# Patient Record
Sex: Female | Born: 1935
Health system: Southern US, Community
[De-identification: ages and names within clinical notes are randomized; demographics above are authoritative.]

## PROBLEM LIST (undated history)

## (undated) DIAGNOSIS — M1990 Unspecified osteoarthritis, unspecified site: Secondary | ICD-10-CM

## (undated) DIAGNOSIS — I341 Nonrheumatic mitral (valve) prolapse: Secondary | ICD-10-CM

## (undated) DIAGNOSIS — S0990XA Unspecified injury of head, initial encounter: Secondary | ICD-10-CM

## (undated) DIAGNOSIS — K5900 Constipation, unspecified: Secondary | ICD-10-CM

## (undated) DIAGNOSIS — I10 Essential (primary) hypertension: Secondary | ICD-10-CM

## (undated) DIAGNOSIS — F329 Major depressive disorder, single episode, unspecified: Secondary | ICD-10-CM

## (undated) DIAGNOSIS — H269 Unspecified cataract: Secondary | ICD-10-CM

## (undated) DIAGNOSIS — I639 Cerebral infarction, unspecified: Secondary | ICD-10-CM

## (undated) DIAGNOSIS — R079 Chest pain, unspecified: Secondary | ICD-10-CM

## (undated) DIAGNOSIS — K5792 Diverticulitis of intestine, part unspecified, without perforation or abscess without bleeding: Secondary | ICD-10-CM

## (undated) DIAGNOSIS — F32A Depression, unspecified: Secondary | ICD-10-CM

## (undated) DIAGNOSIS — I209 Angina pectoris, unspecified: Secondary | ICD-10-CM

## (undated) DIAGNOSIS — M8580 Other specified disorders of bone density and structure, unspecified site: Secondary | ICD-10-CM

## (undated) HISTORY — DX: Chest pain, unspecified: R07.9

## (undated) HISTORY — DX: Unspecified cataract: H26.9

## (undated) HISTORY — PX: COLONOSCOPY: SHX174

## (undated) HISTORY — DX: Diverticulitis of intestine, part unspecified, without perforation or abscess without bleeding: K57.92

## (undated) HISTORY — DX: Angina pectoris, unspecified: I20.9

---
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
## 1988-03-05 HISTORY — PX: BREAST CYST ASPIRATION: SHX578

## 1997-11-17 ENCOUNTER — Ambulatory Visit (HOSPITAL_COMMUNITY): Admission: RE | Admit: 1997-11-17 | Discharge: 1997-11-17 | Payer: Self-pay | Admitting: Family Medicine

## 1998-09-07 ENCOUNTER — Ambulatory Visit (HOSPITAL_COMMUNITY): Admission: RE | Admit: 1998-09-07 | Discharge: 1998-09-07 | Payer: Self-pay | Admitting: Urology

## 1998-09-07 ENCOUNTER — Encounter (INDEPENDENT_AMBULATORY_CARE_PROVIDER_SITE_OTHER): Payer: Self-pay | Admitting: Specialist

## 1998-09-07 ENCOUNTER — Encounter: Payer: Self-pay | Admitting: Urology

## 1998-11-29 ENCOUNTER — Ambulatory Visit (HOSPITAL_COMMUNITY): Admission: RE | Admit: 1998-11-29 | Discharge: 1998-11-29 | Payer: Self-pay | Admitting: Family Medicine

## 1998-11-29 ENCOUNTER — Encounter: Payer: Self-pay | Admitting: Family Medicine

## 1998-12-07 ENCOUNTER — Other Ambulatory Visit: Admission: RE | Admit: 1998-12-07 | Discharge: 1998-12-07 | Payer: Self-pay | Admitting: Gynecology

## 1998-12-28 ENCOUNTER — Encounter: Admission: RE | Admit: 1998-12-28 | Discharge: 1998-12-28 | Payer: Self-pay | Admitting: Urology

## 1998-12-28 ENCOUNTER — Encounter: Payer: Self-pay | Admitting: Urology

## 1999-08-28 ENCOUNTER — Encounter: Admission: RE | Admit: 1999-08-28 | Discharge: 1999-08-28 | Payer: Self-pay | Admitting: Family Medicine

## 1999-08-28 ENCOUNTER — Encounter: Payer: Self-pay | Admitting: Family Medicine

## 1999-12-06 ENCOUNTER — Other Ambulatory Visit: Admission: RE | Admit: 1999-12-06 | Discharge: 1999-12-06 | Payer: Self-pay | Admitting: Gynecology

## 1999-12-13 ENCOUNTER — Ambulatory Visit (HOSPITAL_COMMUNITY): Admission: RE | Admit: 1999-12-13 | Discharge: 1999-12-13 | Payer: Self-pay | Admitting: Gynecology

## 1999-12-13 ENCOUNTER — Encounter: Payer: Self-pay | Admitting: Gynecology

## 1999-12-29 ENCOUNTER — Other Ambulatory Visit: Admission: RE | Admit: 1999-12-29 | Discharge: 1999-12-29 | Payer: Self-pay | Admitting: Gynecology

## 1999-12-29 ENCOUNTER — Encounter (INDEPENDENT_AMBULATORY_CARE_PROVIDER_SITE_OTHER): Payer: Self-pay | Admitting: Specialist

## 2000-12-19 ENCOUNTER — Ambulatory Visit (HOSPITAL_COMMUNITY): Admission: RE | Admit: 2000-12-19 | Discharge: 2000-12-19 | Payer: Self-pay | Admitting: Gynecology

## 2000-12-19 ENCOUNTER — Encounter: Payer: Self-pay | Admitting: Family Medicine

## 2001-01-02 ENCOUNTER — Encounter: Admission: RE | Admit: 2001-01-02 | Discharge: 2001-01-02 | Payer: Self-pay | Admitting: Family Medicine

## 2001-01-02 ENCOUNTER — Encounter: Payer: Self-pay | Admitting: Family Medicine

## 2001-01-02 ENCOUNTER — Other Ambulatory Visit: Admission: RE | Admit: 2001-01-02 | Discharge: 2001-01-02 | Payer: Self-pay | Admitting: Gynecology

## 2001-02-17 ENCOUNTER — Encounter: Payer: Self-pay | Admitting: Gynecology

## 2001-02-17 ENCOUNTER — Ambulatory Visit (HOSPITAL_COMMUNITY): Admission: RE | Admit: 2001-02-17 | Discharge: 2001-02-17 | Payer: Self-pay | Admitting: Gynecology

## 2001-10-06 ENCOUNTER — Encounter: Admission: RE | Admit: 2001-10-06 | Discharge: 2001-10-06 | Payer: Self-pay | Admitting: Family Medicine

## 2001-10-06 ENCOUNTER — Encounter: Payer: Self-pay | Admitting: Family Medicine

## 2002-01-07 ENCOUNTER — Other Ambulatory Visit: Admission: RE | Admit: 2002-01-07 | Discharge: 2002-01-07 | Payer: Self-pay | Admitting: Gynecology

## 2002-01-26 ENCOUNTER — Ambulatory Visit (HOSPITAL_COMMUNITY): Admission: RE | Admit: 2002-01-26 | Discharge: 2002-01-26 | Payer: Self-pay | Admitting: Family Medicine

## 2002-01-26 ENCOUNTER — Encounter: Payer: Self-pay | Admitting: Family Medicine

## 2003-01-11 ENCOUNTER — Other Ambulatory Visit: Admission: RE | Admit: 2003-01-11 | Discharge: 2003-01-11 | Payer: Self-pay | Admitting: Gynecology

## 2003-02-05 ENCOUNTER — Ambulatory Visit (HOSPITAL_COMMUNITY): Admission: RE | Admit: 2003-02-05 | Discharge: 2003-02-05 | Payer: Self-pay | Admitting: Gynecology

## 2003-03-15 ENCOUNTER — Ambulatory Visit (HOSPITAL_COMMUNITY): Admission: RE | Admit: 2003-03-15 | Discharge: 2003-03-15 | Payer: Self-pay | Admitting: Gastroenterology

## 2003-03-15 LAB — HM COLONOSCOPY

## 2004-01-19 ENCOUNTER — Other Ambulatory Visit: Admission: RE | Admit: 2004-01-19 | Discharge: 2004-01-19 | Payer: Self-pay | Admitting: Gynecology

## 2004-02-09 ENCOUNTER — Ambulatory Visit (HOSPITAL_COMMUNITY): Admission: RE | Admit: 2004-02-09 | Discharge: 2004-02-09 | Payer: Self-pay | Admitting: Family Medicine

## 2005-03-23 ENCOUNTER — Ambulatory Visit (HOSPITAL_COMMUNITY): Admission: RE | Admit: 2005-03-23 | Discharge: 2005-03-23 | Payer: Self-pay | Admitting: Family Medicine

## 2005-04-10 ENCOUNTER — Other Ambulatory Visit: Admission: RE | Admit: 2005-04-10 | Discharge: 2005-04-10 | Payer: Self-pay | Admitting: Gynecology

## 2005-12-18 ENCOUNTER — Ambulatory Visit (HOSPITAL_COMMUNITY): Admission: RE | Admit: 2005-12-18 | Discharge: 2005-12-18 | Payer: Self-pay | Admitting: Family Medicine

## 2006-03-05 DIAGNOSIS — H269 Unspecified cataract: Secondary | ICD-10-CM

## 2006-03-05 HISTORY — DX: Unspecified cataract: H26.9

## 2006-04-08 ENCOUNTER — Ambulatory Visit (HOSPITAL_COMMUNITY): Admission: RE | Admit: 2006-04-08 | Discharge: 2006-04-08 | Payer: Self-pay | Admitting: Gynecology

## 2006-04-30 ENCOUNTER — Other Ambulatory Visit: Admission: RE | Admit: 2006-04-30 | Discharge: 2006-04-30 | Payer: Self-pay | Admitting: Gynecology

## 2007-04-10 ENCOUNTER — Ambulatory Visit (HOSPITAL_COMMUNITY): Admission: RE | Admit: 2007-04-10 | Discharge: 2007-04-10 | Payer: Self-pay | Admitting: Family Medicine

## 2007-09-04 DIAGNOSIS — R079 Chest pain, unspecified: Secondary | ICD-10-CM

## 2007-09-04 HISTORY — DX: Chest pain, unspecified: R07.9

## 2007-11-03 ENCOUNTER — Other Ambulatory Visit: Admission: RE | Admit: 2007-11-03 | Discharge: 2007-11-03 | Payer: Self-pay | Admitting: Gynecology

## 2007-11-07 ENCOUNTER — Ambulatory Visit (HOSPITAL_COMMUNITY): Admission: RE | Admit: 2007-11-07 | Discharge: 2007-11-07 | Payer: Self-pay | Admitting: Gynecology

## 2008-04-23 ENCOUNTER — Ambulatory Visit: Payer: Self-pay | Admitting: Internal Medicine

## 2008-04-23 ENCOUNTER — Inpatient Hospital Stay (HOSPITAL_COMMUNITY): Admission: EM | Admit: 2008-04-23 | Discharge: 2008-04-30 | Payer: Self-pay | Admitting: Emergency Medicine

## 2008-05-26 ENCOUNTER — Ambulatory Visit (HOSPITAL_COMMUNITY): Admission: RE | Admit: 2008-05-26 | Discharge: 2008-05-26 | Payer: Self-pay | Admitting: Family Medicine

## 2008-06-17 ENCOUNTER — Ambulatory Visit (HOSPITAL_COMMUNITY): Admission: RE | Admit: 2008-06-17 | Discharge: 2008-06-17 | Payer: Self-pay | Admitting: Family Medicine

## 2009-03-05 HISTORY — PX: EYE SURGERY: SHX253

## 2009-07-07 ENCOUNTER — Ambulatory Visit (HOSPITAL_COMMUNITY): Admission: RE | Admit: 2009-07-07 | Discharge: 2009-07-07 | Payer: Self-pay | Admitting: Family Medicine

## 2009-11-09 ENCOUNTER — Ambulatory Visit (HOSPITAL_COMMUNITY): Admission: RE | Admit: 2009-11-09 | Discharge: 2009-11-09 | Payer: Self-pay | Admitting: Family Medicine

## 2010-03-05 DIAGNOSIS — Z9889 Other specified postprocedural states: Secondary | ICD-10-CM

## 2010-03-05 HISTORY — DX: Other specified postprocedural states: Z98.890

## 2010-04-05 HISTORY — PX: OTHER SURGICAL HISTORY: SHX169

## 2010-06-20 LAB — BASIC METABOLIC PANEL
BUN: 2 mg/dL — ABNORMAL LOW (ref 6–23)
BUN: 23 mg/dL (ref 6–23)
BUN: 3 mg/dL — ABNORMAL LOW (ref 6–23)
BUN: 6 mg/dL (ref 6–23)
BUN: 7 mg/dL (ref 6–23)
BUN: 9 mg/dL (ref 6–23)
CO2: 27 mEq/L (ref 19–32)
CO2: 28 mEq/L (ref 19–32)
CO2: 28 mEq/L (ref 19–32)
CO2: 29 mEq/L (ref 19–32)
CO2: 29 mEq/L (ref 19–32)
CO2: 30 mEq/L (ref 19–32)
Calcium: 7.8 mg/dL — ABNORMAL LOW (ref 8.4–10.5)
Calcium: 7.9 mg/dL — ABNORMAL LOW (ref 8.4–10.5)
Calcium: 8.6 mg/dL (ref 8.4–10.5)
Calcium: 8.6 mg/dL (ref 8.4–10.5)
Calcium: 8.7 mg/dL (ref 8.4–10.5)
Calcium: 8.9 mg/dL (ref 8.4–10.5)
Chloride: 104 mEq/L (ref 96–112)
Chloride: 104 mEq/L (ref 96–112)
Chloride: 104 mEq/L (ref 96–112)
Chloride: 105 mEq/L (ref 96–112)
Chloride: 109 mEq/L (ref 96–112)
Chloride: 110 mEq/L (ref 96–112)
Creatinine, Ser: 0.66 mg/dL (ref 0.4–1.2)
Creatinine, Ser: 0.69 mg/dL (ref 0.4–1.2)
Creatinine, Ser: 0.72 mg/dL (ref 0.4–1.2)
Creatinine, Ser: 0.86 mg/dL (ref 0.4–1.2)
Creatinine, Ser: 0.9 mg/dL (ref 0.4–1.2)
Creatinine, Ser: 1.14 mg/dL (ref 0.4–1.2)
GFR calc Af Amer: 57 mL/min — ABNORMAL LOW (ref >60)
GFR calc Af Amer: >60 mL/min (ref >60)
GFR calc Af Amer: >60 mL/min (ref >60)
GFR calc Af Amer: >60 mL/min (ref >60)
GFR calc Af Amer: >60 mL/min (ref >60)
GFR calc Af Amer: >60 mL/min (ref >60)
GFR calc non Af Amer: 47 mL/min — ABNORMAL LOW (ref >60)
GFR calc non Af Amer: >60 mL/min (ref >60)
GFR calc non Af Amer: >60 mL/min (ref >60)
GFR calc non Af Amer: >60 mL/min (ref >60)
GFR calc non Af Amer: >60 mL/min (ref >60)
GFR calc non Af Amer: >60 mL/min (ref >60)
Glucose, Bld: 110 mg/dL — ABNORMAL HIGH (ref 70–99)
Glucose, Bld: 115 mg/dL — ABNORMAL HIGH (ref 70–99)
Glucose, Bld: 133 mg/dL — ABNORMAL HIGH (ref 70–99)
Glucose, Bld: 145 mg/dL — ABNORMAL HIGH (ref 70–99)
Glucose, Bld: 95 mg/dL (ref 70–99)
Glucose, Bld: 99 mg/dL (ref 70–99)
Potassium: 3.1 mEq/L — ABNORMAL LOW (ref 3.5–5.1)
Potassium: 3.5 mEq/L (ref 3.5–5.1)
Potassium: 3.6 mEq/L (ref 3.5–5.1)
Potassium: 3.9 mEq/L (ref 3.5–5.1)
Potassium: 4 mEq/L (ref 3.5–5.1)
Potassium: 4.2 mEq/L (ref 3.5–5.1)
Sodium: 138 mEq/L (ref 135–145)
Sodium: 138 mEq/L (ref 135–145)
Sodium: 140 mEq/L (ref 135–145)
Sodium: 140 mEq/L (ref 135–145)
Sodium: 141 mEq/L (ref 135–145)
Sodium: 143 mEq/L (ref 135–145)

## 2010-06-20 LAB — CBC
HCT: 33.7 % — ABNORMAL LOW (ref 36.0–46.0)
HCT: 35.3 % — ABNORMAL LOW (ref 36.0–46.0)
HCT: 36.1 % (ref 36.0–46.0)
HCT: 40.8 % (ref 36.0–46.0)
HCT: 41.1 % (ref 36.0–46.0)
HCT: 47 % — ABNORMAL HIGH (ref 36.0–46.0)
Hemoglobin: 11.4 g/dL — ABNORMAL LOW (ref 12.0–15.0)
Hemoglobin: 12.1 g/dL (ref 12.0–15.0)
Hemoglobin: 12.4 g/dL (ref 12.0–15.0)
Hemoglobin: 13.7 g/dL (ref 12.0–15.0)
Hemoglobin: 13.9 g/dL (ref 12.0–15.0)
Hemoglobin: 15.8 g/dL — ABNORMAL HIGH (ref 12.0–15.0)
MCHC: 33.5 g/dL (ref 30.0–36.0)
MCHC: 33.5 g/dL (ref 30.0–36.0)
MCHC: 33.8 g/dL (ref 30.0–36.0)
MCHC: 33.8 g/dL (ref 30.0–36.0)
MCHC: 34.2 g/dL (ref 30.0–36.0)
MCHC: 34.4 g/dL (ref 30.0–36.0)
MCV: 93.7 fL (ref 78.0–100.0)
MCV: 93.8 fL (ref 78.0–100.0)
MCV: 94 fL (ref 78.0–100.0)
MCV: 94.2 fL (ref 78.0–100.0)
MCV: 94.5 fL (ref 78.0–100.0)
MCV: 94.7 fL (ref 78.0–100.0)
Platelets: 191 10*3/uL (ref 150–400)
Platelets: 201 10*3/uL (ref 150–400)
Platelets: 233 10*3/uL (ref 150–400)
Platelets: 244 10*3/uL (ref 150–400)
Platelets: 286 10*3/uL (ref 150–400)
Platelets: 301 10*3/uL (ref 150–400)
RBC: 3.57 MIL/uL — ABNORMAL LOW (ref 3.87–5.11)
RBC: 3.76 MIL/uL — ABNORMAL LOW (ref 3.87–5.11)
RBC: 3.85 MIL/uL — ABNORMAL LOW (ref 3.87–5.11)
RBC: 4.31 MIL/uL (ref 3.87–5.11)
RBC: 4.38 MIL/uL (ref 3.87–5.11)
RBC: 4.99 MIL/uL (ref 3.87–5.11)
RDW: 12.4 % (ref 11.5–15.5)
RDW: 12.6 % (ref 11.5–15.5)
RDW: 12.6 % (ref 11.5–15.5)
RDW: 12.6 % (ref 11.5–15.5)
RDW: 12.6 % (ref 11.5–15.5)
RDW: 12.7 % (ref 11.5–15.5)
WBC: 10.4 10*3/uL (ref 4.0–10.5)
WBC: 10.6 10*3/uL — ABNORMAL HIGH (ref 4.0–10.5)
WBC: 11.8 10*3/uL — ABNORMAL HIGH (ref 4.0–10.5)
WBC: 13.3 10*3/uL — ABNORMAL HIGH (ref 4.0–10.5)
WBC: 7.6 10*3/uL (ref 4.0–10.5)
WBC: 8.6 10*3/uL (ref 4.0–10.5)

## 2010-06-20 LAB — URINE MICROSCOPIC-ADD ON

## 2010-06-20 LAB — COMPREHENSIVE METABOLIC PANEL
ALT: 22 U/L (ref 0–35)
AST: 32 U/L (ref 0–37)
Albumin: 4.3 g/dL (ref 3.5–5.2)
Alkaline Phosphatase: 62 U/L (ref 39–117)
BUN: 21 mg/dL (ref 6–23)
CO2: 29 mEq/L (ref 19–32)
Calcium: 9.6 mg/dL (ref 8.4–10.5)
Chloride: 102 mEq/L (ref 96–112)
Creatinine, Ser: 1.27 mg/dL — ABNORMAL HIGH (ref 0.4–1.2)
GFR calc Af Amer: 50 mL/min — ABNORMAL LOW (ref >60)
GFR calc non Af Amer: 41 mL/min — ABNORMAL LOW (ref >60)
Glucose, Bld: 140 mg/dL — ABNORMAL HIGH (ref 70–99)
Potassium: 3.9 mEq/L (ref 3.5–5.1)
Sodium: 139 mEq/L (ref 135–145)
Total Bilirubin: 1 mg/dL (ref 0.3–1.2)
Total Protein: 7.3 g/dL (ref 6.0–8.3)

## 2010-06-20 LAB — LIPASE, BLOOD: Lipase: 19 U/L (ref 11–59)

## 2010-06-20 LAB — DIFFERENTIAL
Basophils Absolute: 0 10*3/uL (ref 0.0–0.1)
Basophils Absolute: 0.1 10*3/uL (ref 0.0–0.1)
Basophils Relative: 0 % (ref 0–1)
Basophils Relative: 1 % (ref 0–1)
Eosinophils Absolute: 0 10*3/uL (ref 0.0–0.7)
Eosinophils Absolute: 0.4 10*3/uL (ref 0.0–0.7)
Eosinophils Relative: 0 % (ref 0–5)
Eosinophils Relative: 5 % (ref 0–5)
Lymphocytes Relative: 5 % — ABNORMAL LOW (ref 12–46)
Lymphocytes Relative: 9 % — ABNORMAL LOW (ref 12–46)
Lymphs Abs: 0.7 10*3/uL (ref 0.7–4.0)
Lymphs Abs: 0.8 10*3/uL (ref 0.7–4.0)
Monocytes Absolute: 0.3 10*3/uL (ref 0.1–1.0)
Monocytes Absolute: 1 10*3/uL (ref 0.1–1.0)
Monocytes Relative: 12 % (ref 3–12)
Monocytes Relative: 2 % — ABNORMAL LOW (ref 3–12)
Neutro Abs: 12.2 10*3/uL — ABNORMAL HIGH (ref 1.7–7.7)
Neutro Abs: 6.3 10*3/uL (ref 1.7–7.7)
Neutrophils Relative %: 73 % (ref 43–77)
Neutrophils Relative %: 92 % — ABNORMAL HIGH (ref 43–77)

## 2010-06-20 LAB — URINALYSIS, ROUTINE W REFLEX MICROSCOPIC
Bilirubin Urine: NEGATIVE
Glucose, UA: NEGATIVE mg/dL
Ketones, ur: 15 mg/dL — AB
Leukocytes, UA: NEGATIVE
Nitrite: NEGATIVE
Protein, ur: 100 mg/dL — AB
Specific Gravity, Urine: 1.026 (ref 1.005–1.030)
Urobilinogen, UA: 0.2 mg/dL (ref 0.0–1.0)
pH: 5.5 (ref 5.0–8.0)

## 2010-06-20 LAB — POCT CARDIAC MARKERS
CKMB, poc: 2.6 ng/mL (ref 1.0–8.0)
Myoglobin, poc: 110 ng/mL (ref 12–200)
Troponin i, poc: <0.05 ng/mL (ref 0.00–0.09)

## 2010-07-18 NOTE — Consult Note (Signed)
NAMEPATTI, Adriana [name]                   ACCOUNT NO.:  [id number]   MEDICAL RECORD NO.:  [id number]          PATIENT TYPE:  INP   LOCATION:  1532                         FACILITY:  [facility]   PHYSICIAN:  Sharlet Salina T. Hoxworth, M.D.DATE OF BIRTH:  11-02-1934   DATE OF CONSULTATION:  04/25/2008  DATE OF DISCHARGE:                                 CONSULTATION   REFERRING PHYSICIAN:  Dr. Rito Ehrlich.   CHIEF COMPLAINT:  Abdominal pain, nausea, vomiting.   HISTORY OF PRESENT ILLNESS:  I was asked by Dr. Rito Ehrlich today to  evaluate Adriana [name].  She is a very pleasant 76 year old white female.  She was admitted to [facility] on February 19 after an approximately 36-  hour history of abdominal pain, nausea, vomiting.  She was in her usual  state of good health and then developed the gradual onset of upper and  mid abdominal pain which she describes as sharp associated with nausea  and then vomiting.  Her vomitus was initially clear and then became  bilious.  She was seen early on as an outpatient and evaluated and felt  to have a Norovirus, but her pain worsened and then she was admitted on  February 19.  She has no previous history of abdominal surgery.  She  describes constant waxing and waning pain, sharp and pressure-like in  her mid abdomen epigastrium.  The vomiting has been relieved after NG  placement.  She has had one tiny amount of flatus since admission but no  bowel movement.  Her abdomen has been distended initially, improved with  NG and now she states is more distended again.  She again denies any  chronic similar symptoms.  She has had a little worsening reflux  symptoms over the last few months controlled with p.r.n. meds.  No fever  or chills.   CT scan was obtained on admission.  This showed evidence of small bowel  obstruction with no mass or other abnormality or cause of the  obstruction identified.  She subsequently has had two KUBs yesterday and  today, which I have reviewed and  this shows persistent high-grade  partial bowel obstruction, although some contrast does get over to the  colon.   PAST MEDICAL HISTORY:  1. Surgery:  No abdominal surgery.  She has had some orthopedic      procedures.  2. Medically she is generally in good health, followed for mild      hypertension, history of depression, osteoporosis.   MEDICATIONS ON ADMISSION:  1. Fosamax 70 daily.  2. Metoprolol 12.5 b.i.d.  3. Bupropion 200 b.i.d.  4. Celexa 20 daily.  5. Aspirin 81 daily.  6. Calcium.   ALLERGIES:  CODEINE ERYTHROMYCIN IVP DYE DARVOCET.   FAMILY HISTORY:  Significant for heart disease, hypertension.   SOCIAL HISTORY:  Married.  Husband at bedside.  No cigarettes or  alcohol.   REVIEW OF SYSTEMS:  GENERAL:  Weight has been stable.  No fever, chills.  RESPIRATORY:  No shortness of breath, cough, wheezing.  CARDIAC:  No  chest pain, palpitations, swelling, history of heart  disease.  ABDOMEN:  GI as above.  GU:  No urinary burning frequency.  MUSCULOSKELETAL:  Some  mild chronic joint pain.   PHYSICAL EXAM:  Currently she is afebrile, heart rate 72, blood pressure  154/77, respirations 16, O2 saturations 96% room air.  GENERAL:  Mildly sedated pleasant white female in no acute distress.  SKIN:  Warm, dry.  No rash or infection.  HEENT:  No palpable mass or thyromegaly.  Sclerae nonicteric.  LYMPH NODES.  No cervical, subclavicular, inguinal nodes palpable.  LUNGS:  Clear without wheezing or increased work of breathing.  CARDIAC:  Regular rate and rhythm.  No murmurs.  No edema.  ABDOMEN:  Moderate distention.  There is mild diffuse tenderness but  more tenderness in the right lower quadrant.  Some guarding.  No  palpable hernias.  EXTREMITIES:  No joint swelling, deformity.  NEUROLOGIC:  Alert, oriented.  Motor and sensory exams grossly normal.   LABORATORY:  White count 11.8 down from 13 at admission.  Hemoglobin  normal.  Electrolytes unremarkable.  LFTs on  admission were  unremarkable.   ASSESSMENT/PLAN:  Persistent small-bowel obstruction in a 76 year old  female with no prior history abdominal surgery.  No clear etiology is  seen on CT scan.  This clearly does not appear to be improving with  conservative management.  There are multiple possible etiologies  including nonoperative inflammatory adhesions, tumor, internal hernia or  appendicitis or diverticulitis not recognized on CT scan.  Risk for  intestinal strangulation is higher in this setting without previous  surgery and if the patient is not improving and has abdominal  tenderness, I believe she needs exploration.  This was discussed with  the patient and her husband.  They are agreeable.  We will plan to  proceed with exploratory laparotomy for persistent a small bowel  obstruction.      Lorne Skeens. Hoxworth, M.D.  Electronically Signed     BTH/MEDQ  D:  04/25/2008  T:  04/25/2008  Job:  [id number]

## 2010-07-18 NOTE — Discharge Summary (Signed)
NAME:  Adriana [name], Adriana [name]                   ACCOUNT NO.:  [id number]   MEDICAL RECORD NO.:  [id number]          PATIENT TYPE:  INP   LOCATION:  1532                         [facility]ACILITY:  [facility]   PHYSICIAN:  Kela Millin, M.D.DATE OF BIRTH:  13-Feb-1935   DATE OF ADMISSION:  04/23/2008  DATE OF DISCHARGE:  04/30/2008                               DISCHARGE SUMMARY   DISCHARGE DIAGNOSES:  1. Small bowel obstruction - secondary to single omental adhesion.  2. Hypokalemia - resolved.  3. Hypertension.  4. History of depression.  5. History of osteoporosis.  6. Probable complex left parapelvic renal cyst - follow up with PCP      for outpatient MRI.   PROCEDURES/STUDIES:  1. CT scan of the abdomen and pelvis - dilated loops of small bowel,      possible partial small bowel obstruction.  Probable slightly      complex left parapelvic renal cyst.  2. CT scan of pelvis - some free fluid within the pelvis, scattered      rectosigmoid colonic diverticula.  3. Exploratory laparotomy and lysis of adhesions for small bowel      obstruction - per Dr. Johna Sheriff on April 25, 2008.   CONSULTATIONS:  [facility] - Sharlet Salina T. Hoxworth, M.D.   BRIEF HISTORY:  The patient is a pleasant 76 year old white female with  the above listed medical problems who presented with complaints of  nausea, vomiting and abdominal pain.  She went to the Urgent Care and  was treated symptomatically for possible viral gastroenteritis.  She  subsequently followed up with her primary care physician on the day of  admission because of worsening symptoms along with abdominal distention  as well.  She was sent to the ED and lab work revealed a white cell  count of 13.3.  CT scan of her abdomen was done and the results are as  stated above.  She was admitted for further evaluation and management.   Please see the full admission history and physical dictated by Dr.  Amador Cunas for the details of the admission physical  exam as well as  the laboratory data.   HOSPITAL COURSE:  1. Small bowel obstruction secondary to single omental adhesion.  Upon      admission, the patient was kept n.p.o. and NG tube was placed.  She      was hydrated with IV fluids and treated with IV narcotics for pain      management.  Surgery was consulted and Dr. Johna Sheriff saw the      patient.  He followed and monitored the patient.  On April 25, 2008, she was taken to surgery and the omental adhesion sliced.      Surgery followed and she improved.  She was started on a clear      liquid diet which was advanced as tolerated.  Dr. Johna Sheriff rounded      on her today and her incision is clean and healing well.  She is      tolerating p.o. well and hemodynamically stable.  He has      recommended discharge home and I agree.  2. Hypokalemia.  Her potassium was replaced in the hospital.  3. Hypertension - she is to continue her outpatient medications upon      discharge.  4. History of osteoporosis - the patient is to continue her Fosamax      upon discharge.  5. Left parapelvic renal cyst, slightly complex - as noted above, she      is to follow up with her primary care physician to have an MRI of      the abdomen in 2-3 months for further evaluation.   DISCHARGE MEDICATIONS:  1. Ultram 50 mg p.o. q.6 h. p.r.n.  2. Senokot 1 p.o. q.h.s. p.r.n.  3. The patient to continue metoprolol 25 mg half a tablet p.o. b.i.d.  4. Wellbutrin 200 mg p.o. b.i.d.  5. Fosamax 70 mg p.o. once weekly.  6. Celexa 20 mg p.o. daily.  7. Aspirin 81 mg p.o. daily.  8. Calcium D daily as previously.  9. Glucosamine as previously.  10.Multivitamin 1 p.o. daily.   FOLLOW-UP CARE:  1. Dr. Johna Sheriff in 1 week, call for appointment - [phone].  2. Dr. Corliss Blacker in 1-2 weeks, call for appointment.   CONDITION ON DISCHARGE:  Improved and stable.      Kela Millin, M.D.  Electronically Signed     ACV/MEDQ  D:  04/30/2008  T:  04/30/2008   Job:  [id number]   cc:   Lorne Skeens. Hoxworth, M.D.  [address], [address]  [place]  [place] [address]   Pam Drown, M.D.  Fax: [phone]

## 2010-07-18 NOTE — H&P (Signed)
Adriana [name], Adriana [name]                   ACCOUNT NO.:  [id number]   MEDICAL RECORD NO.:  [id number]          PATIENT TYPE:  INP   LOCATION:  1532                         [facility]ACILITY:  [facility]   PHYSICIAN:  Gordy Savers, MDDATE OF BIRTH:  November 19, 1934   DATE OF ADMISSION:  04/23/2008  DATE OF DISCHARGE:                              HISTORY & PHYSICAL   CHIEF COMPLAINT:  Abdominal pain, nausea and vomiting.   HISTORY OF PRESENT ILLNESS:  The patient is a 76 year old female who was  in her usual state of good health until the day prior to admission she  awoke yesterday morning with some mild nausea and throughout the day  developed some mild lower abdominal discomfort, throughout the day  developed pain became more generalized, off in the epigastric area and  was associated episodes of severe cramping.  She developed more severe  and frequent nausea and vomiting.  Yesterday she did have two bowel  movement she described as normal.  The patient was seen at Urgent Care  and treated symptomatically for suspected viral gastroenteritis.  The  patient was seen by her primary care physician today with worsening  abdominal pain, distention and was noted to have a leukocytosis.  She  was referred to the ED for further evaluation and treatment of her  abdominal pain, distention and intractable nausea and vomiting.  ED  evaluation included a white count of 13,300.  Chemistries were normal,  lipase was 19.  Urinalysis revealed mild ketonuria.  A CT scan of the  abdomen and pelvis was obtained which revealed findings consistent with  an early small-bowel obstruction.  The patient is now admitted for  further evaluation and treatment of suspected partial small-bowel  obstruction.   PAST MEDICAL HISTORY:   SURGICAL HISTORY:  The patient has had no prior abdominal surgery.   MEDICAL PROBLEMS:  1. Hypertension.  2. History of depression.  3. Osteoporosis.   OPERATIONS:  1. Remote childhood  tonsillectomy.  2. Knee and wrist orthopedic surgery.   PRESENT MEDICAL REGIMEN:  1. Fosamax 70 mg daily.  2. Metoprolol 12.5 mg b.i.d.  3. Bupropion 200 mg b.i.d.  4. Celexa 20 mg daily.  5. Aspirin 81 mg daily.  6. Calcium supplementation.   ALLERGIES:  CODEINE, ERYTHROMYCIN, IVP DYE, PROPOXYPHENE   FAMILY HISTORY:  Father died at age 101 history of coronary artery  disease status post CABG.  Mother died at 66 of complications of  cerebrovascular disease.  She had hypertension and advanced dementia.  She has one brother who has a history of prostate cancer who is a family  practitioner in [place].  The patient has one daughter and a number of  step children and some foster children.   SOCIAL HISTORY:  The patient is married.  Her husband is retired  Orthoptist in the [employer].  She is a lifelong nondrinker,  nonsmoker.   PHYSICAL EXAMINATION:  GENERAL:  Revealed a well-developed, healthy-  appearing female who is slightly somnolent due to the medication.  She  was verbal and appropriate.  SKIN:  Warm  and dry without rash.  HEENT:  Exam revealed normal pupillary responses.  Conjunctiva clear.  She was anicteric.  The left canal was occluded with cerumen.  The right  tympanic membrane was normal.  Oropharynx was slightly dry.  Otherwise  unremarkable.  NECK:  There were no bruits, adenopathy or neck vein distention.  CHEST:  Clear.  CARDIOVASCULAR:  Normal S1-S2.  There is no tachycardia.  BREASTS:  Normal.  ABDOMEN:  Revealed mild diffuse tenderness and mild distention.  Abdominal tenderness was most marked in the suprapubic area.  Bowel  sounds were present.  There was no guarding or rebound.  No bruits were  appreciated.  EXTREMITIES:  Revealed no edema.  Peripheral pulses were full.   IMPRESSION:  Suspected early small-bowel obstruction.   ADDITIONAL DIAGNOSES:  1. Hypertension.  2. History of depression.  3. Osteoporosis.   DISPOSITION:  The patient  will be admitted to the hospital and support  IV fluids.  She will be placed on IV proton pump inhibition.  A  nasogastric tube will be inserted and placed on intermittent suction.  She will be placed on bowel rest.  In the morning, follow-up KUB and  upright abdomen will be reviewed as well as follow-up electrolytes and  another CBC.  The patient will be treated symptomatically.  A Surgical  consult will be considered if the patient is not improved.      Gordy Savers, MD  Electronically Signed     PFK/MEDQ  D:  04/23/2008  T:  04/24/2008  Job:  [id number]

## 2010-07-18 NOTE — Op Note (Signed)
Adriana, [name]                   ACCOUNT NO.:  [id number]   MEDICAL RECORD NO.:  [id number]          PATIENT TYPE:  INP   LOCATION:  1532                         [facility]AC[facility]   PHYSICIAN:  Adriana [name], M.D.DATE OF BIRTH:  March 02, 1935   DATE OF PROCEDURE:  04/25/2008  DATE OF DISCHARGE:                               OPERATIVE REPORT   PREOPERATIVE DIAGNOSIS:  Small bowel obstruction.   POSTOPERATIVE DIAGNOSIS:  Small bowel obstruction secondary to single  omental adhesion.   SURGICAL PROCEDURE:  Exploratory laparotomy and lysis of adhesions for  small bowel obstruction.   SURGEON:  Adriana Skeens. [name], M.D.   ASSISTANT:  Adriana [name], M.D.   ANESTHESIA:  General.   BRIEF HISTORY:  Adriana [name] is a 76 year old female who was admitted  approximately 36 hours ago with 24 hours of abdominal pain, nausea and  vomiting.  She has had a CT scan showing small bowel obstruction without  apparent etiology.  On follow-up, she has failed to improve with  conservative management and I have recommended proceeding with  exploratory laparotomy.  The indications for the procedure, its nature,  risks of bleeding, infection, cardiorespiratory complications, possible  findings were discussed with the patient and her husband.  She is now  brought to the operating room for this procedure.   DESCRIPTION OF OPERATION:  The patient brought to the operating room,  placed in the supine position on the operating table and general  orotracheal anesthesia was induced.  The abdomen was widely sterilely  prepped and draped.  Correct patient and procedure were verified.  She  received preoperative IV antibiotics.  Pneumatic anti-embolism stockings  were placed.  The abdomen was explored through a fairly small midline  incision skirting the umbilicus.  She had no previous history of any  abdominal surgery.  On entering the peritoneal cavity there was a  moderate amount of straw-colored  peritoneal fluid.  The small bowel was  delivered into the wound and immediately apparent was a hemorrhagic,  moderately distended, loop of small-bowel.  There was a definite linear  mark across the mesentery and proximal and distal end indicating this  had been beneath an adhesion.  It appeared that I had reduced it on  bringing this into the wound.  We then ran the bowel from the ligament  of Treitz distally.  The involved area was the mid small bowel about a  foot Fass.  As we went distally we found a fairly dense band-like  adhesion in the distal ascending colon from omentum to omentum with the  small bowel underneath this and this is clearly where the loop of  intestine had been trapped.  This was divided with cautery.  The  remainder of the small bowel appeared completely normal as did the  colon, although the cecum and right colon were somewhat mobile and  floppy.  No other abnormalities were found.  The bowel was left alone in  the abdominal cavity for several minutes and then brought out and the  involved segment was clearly improving and although hemorrhagic was  pink, appeared viable with good peristalsis.  Following this, the  viscera were returned to their anatomic position.  The midline fascia  was closed with running looped #1 PDS, begun at either end of the  incision and tied centrally.  The subcutaneous tissue was irrigated, the  skin closed with staples.  Sponge, needle and instrument counts were  correct.  Dry sterile dressings were applied and the patient taken to  recovery in good condition.      Adriana Skeens. [name], M.D.  Electronically Signed     BTH/MEDQ  D:  04/25/2008  T:  04/25/2008  Job:  [id number]

## 2010-07-21 NOTE — Op Note (Signed)
NAME:  [name], Adriana A                             ACCOUNT NO.:  [id number]   MEDICAL RECORD NO.:  [id number]                   PATIENT TYPE:  AMB   LOCATION:  ENDO                                 FACILITY:  [facility]   PHYSICIAN:  Anselmo Rod, M.D.               DATE OF BIRTH:  04-Dec-1934   DATE OF PROCEDURE:  03/15/2003  DATE OF DISCHARGE:                                 OPERATIVE REPORT   PROCEDURE PERFORMED:  Screening colonoscopy.   ENDOSCOPIST:  Anselmo Rod, M.D.   INSTRUMENT USED:  Olympus videocolonoscope.   INDICATIONS FOR PROCEDURE:  This 76 year old white female underwent  screening colonoscopy.  The patient has a history of mitral valve prolapse.  Rule out colonic polyps, masses, etc.   PREPROCEDURE PREPARATION:  Informed consent was secured from the patient.  The patient fasted for eight hours prior to the procedure and prepped with a  bottle of magnesium citrate and one gallon of GoLYTELY the night prior to  the procedure.   PREPROCEDURE PHYSICAL:  VITAL SIGNS:  Stable.  NECK:  Supple.  CHEST:  Clear to auscultation.  HEART:  S1, S2 regular.  ABDOMEN:  Soft, with normal bowel sounds.   DESCRIPTION OF THE PROCEDURE:  The patient was placed in the left lateral  decubitus position and sedated with 50 mg of Demerol and 5 mg of Versed  intravenously.  She also received Cipro 400 mg IV for MVP prophylaxis.  Once  the patient was adequately sedated and maintained on low-flow oxygen and  continuous cardiac monitoring, the Olympus videocolonoscope was advanced  from the rectum to the cecum without difficulty.  The appendiceal orifice  and the ileocecal valve are clearly visualized and photographed.  The  terminal ileum appeared normal without lesions.  Small internal hemorrhoids  are seen on retroflexion of the rectum.  No masses or polyps are identified.  There was no evidence of diverticulosis.   IMPRESSION:  Normal colonoscopy of the terminal ileum except for small  internal hemorrhoids.   RECOMMENDATIONS:  1. Continue on high-fiber diet with liberal fluid intake.  2. Repeat CRC screening in the next 10 years unless the patient develops any     abnormal symptoms in the interim.  3. Outpatient follow-up as need arises in the future.                                               Anselmo Rod, M.D.    JNM/MEDQ  D:  03/15/2003  T:  03/15/2003  Job:  [id number]   cc:   Teena Irani. Arlyce Dice, M.D.  [address]  [place]  [place] [address]  Fax: [phone]

## 2010-07-31 ENCOUNTER — Inpatient Hospital Stay (INDEPENDENT_AMBULATORY_CARE_PROVIDER_SITE_OTHER)
Admission: RE | Admit: 2010-07-31 | Discharge: 2010-07-31 | Disposition: A | Discharge status: Home / Self Care | Payer: Medicare Other | Source: Ambulatory Visit | Attending: Family Medicine | Admitting: Family Medicine

## 2010-07-31 ENCOUNTER — Inpatient Hospital Stay (HOSPITAL_COMMUNITY)
Admission: EM | Admit: 2010-07-31 | Discharge: 2010-08-02 | DRG: 287 | Disposition: A | Discharge status: Home / Self Care | Payer: Medicare Other | Attending: Internal Medicine | Admitting: Internal Medicine

## 2010-07-31 ENCOUNTER — Emergency Department (HOSPITAL_COMMUNITY): Payer: Medicare Other

## 2010-07-31 DIAGNOSIS — Z7982 Long term (current) use of aspirin: Secondary | ICD-10-CM

## 2010-07-31 DIAGNOSIS — F3289 Other specified depressive episodes: Secondary | ICD-10-CM | POA: Diagnosis present

## 2010-07-31 DIAGNOSIS — I059 Rheumatic mitral valve disease, unspecified: Secondary | ICD-10-CM | POA: Diagnosis present

## 2010-07-31 DIAGNOSIS — I319 Disease of pericardium, unspecified: Secondary | ICD-10-CM | POA: Diagnosis present

## 2010-07-31 DIAGNOSIS — F329 Major depressive disorder, single episode, unspecified: Secondary | ICD-10-CM | POA: Diagnosis present

## 2010-07-31 DIAGNOSIS — I10 Essential (primary) hypertension: Secondary | ICD-10-CM | POA: Diagnosis present

## 2010-07-31 DIAGNOSIS — R0789 Other chest pain: Principal | ICD-10-CM | POA: Diagnosis present

## 2010-07-31 DIAGNOSIS — R079 Chest pain, unspecified: Secondary | ICD-10-CM

## 2010-07-31 LAB — CBC
HCT: 40.6 % (ref 36.0–46.0)
Hemoglobin: 13.7 g/dL (ref 12.0–15.0)
MCH: 30.9 pg (ref 26.0–34.0)
MCHC: 33.7 g/dL (ref 30.0–36.0)
MCV: 91.4 fL (ref 78.0–100.0)
Platelets: 276 10*3/uL (ref 150–400)
RBC: 4.44 MIL/uL (ref 3.87–5.11)
RDW: 12.5 % (ref 11.5–15.5)
WBC: 9.1 10*3/uL (ref 4.0–10.5)

## 2010-07-31 LAB — COMPREHENSIVE METABOLIC PANEL
ALT: 22 U/L (ref 0–35)
AST: 29 U/L (ref 0–37)
Albumin: 4.1 g/dL (ref 3.5–5.2)
Alkaline Phosphatase: 67 U/L (ref 39–117)
BUN: 20 mg/dL (ref 6–23)
CO2: 28 mEq/L (ref 19–32)
Calcium: 10.4 mg/dL (ref 8.4–10.5)
Chloride: 102 mEq/L (ref 96–112)
Creatinine, Ser: 0.85 mg/dL (ref 0.4–1.2)
GFR calc Af Amer: >60 mL/min (ref >60)
GFR calc non Af Amer: >60 mL/min (ref >60)
Glucose, Bld: 94 mg/dL (ref 70–99)
Potassium: 4 mEq/L (ref 3.5–5.1)
Sodium: 140 mEq/L (ref 135–145)
Total Bilirubin: 0.3 mg/dL (ref 0.3–1.2)
Total Protein: 7.2 g/dL (ref 6.0–8.3)

## 2010-07-31 LAB — CK TOTAL AND CKMB (NOT AT ARMC)
CK, MB: 6.1 ng/mL (ref 0.3–4.0)
Relative Index: 3.5 — ABNORMAL HIGH (ref 0.0–2.5)
Total CK: 173 U/L (ref 7–177)

## 2010-07-31 LAB — DIFFERENTIAL
Basophils Absolute: 0 10*3/uL (ref 0.0–0.1)
Basophils Relative: 0 % (ref 0–1)
Eosinophils Absolute: 0.5 10*3/uL (ref 0.0–0.7)
Eosinophils Relative: 6 % — ABNORMAL HIGH (ref 0–5)
Lymphocytes Relative: 22 % (ref 12–46)
Lymphs Abs: 2 10*3/uL (ref 0.7–4.0)
Monocytes Absolute: 0.7 10*3/uL (ref 0.1–1.0)
Monocytes Relative: 7 % (ref 3–12)
Neutro Abs: 5.9 10*3/uL (ref 1.7–7.7)
Neutrophils Relative %: 65 % (ref 43–77)

## 2010-07-31 LAB — TROPONIN I: Troponin I: <0.3 ng/mL (ref <0.30)

## 2010-08-01 HISTORY — PX: CARDIAC CATHETERIZATION: SHX172

## 2010-08-01 LAB — CARDIAC PANEL(CRET KIN+CKTOT+MB+TROPI)
CK, MB: 4.8 ng/mL — ABNORMAL HIGH (ref 0.3–4.0)
CK, MB: 4.2 ng/mL — ABNORMAL HIGH (ref 0.3–4.0)
Relative Index: 3.9 — ABNORMAL HIGH (ref 0.0–2.5)
Relative Index: 3 — ABNORMAL HIGH (ref 0.0–2.5)
Total CK: 122 U/L (ref 7–177)
Total CK: 138 U/L (ref 7–177)
Troponin I: <0.3 ng/mL (ref <0.30)
Troponin I: <0.3 ng/mL (ref <0.30)

## 2010-08-01 LAB — LIPID PANEL
Cholesterol: 163 mg/dL (ref 0–200)
HDL: 50 mg/dL (ref >39)
LDL Cholesterol: 99 mg/dL (ref 0–99)
Total CHOL/HDL Ratio: 3.3 RATIO
Triglycerides: 72 mg/dL (ref <150)
VLDL: 14 mg/dL (ref 0–40)

## 2010-08-01 LAB — PRO B NATRIURETIC PEPTIDE: Pro B Natriuretic peptide (BNP): 86.5 pg/mL (ref 0–125)

## 2010-08-01 LAB — HEPARIN LEVEL (UNFRACTIONATED)
Heparin Unfractionated: 0.72 IU/mL — ABNORMAL HIGH (ref 0.30–0.70)
Heparin Unfractionated: 0.72 IU/mL — ABNORMAL HIGH (ref 0.30–0.70)

## 2010-08-01 LAB — CBC
HCT: 39.8 % (ref 36.0–46.0)
HCT: 39.6 % (ref 36.0–46.0)
Hemoglobin: 13.1 g/dL (ref 12.0–15.0)
Hemoglobin: 13.2 g/dL (ref 12.0–15.0)
MCH: 30.3 pg (ref 26.0–34.0)
MCH: 30.7 pg (ref 26.0–34.0)
MCHC: 32.9 g/dL (ref 30.0–36.0)
MCHC: 33.3 g/dL (ref 30.0–36.0)
MCV: 92.1 fL (ref 78.0–100.0)
MCV: 92.1 fL (ref 78.0–100.0)
Platelets: 256 10*3/uL (ref 150–400)
Platelets: 258 10*3/uL (ref 150–400)
RBC: 4.32 MIL/uL (ref 3.87–5.11)
RBC: 4.3 MIL/uL (ref 3.87–5.11)
RDW: 12.5 % (ref 11.5–15.5)
RDW: 12.7 % (ref 11.5–15.5)
WBC: 8 10*3/uL (ref 4.0–10.5)
WBC: 7.6 10*3/uL (ref 4.0–10.5)

## 2010-08-01 LAB — HEMOGLOBIN A1C
Hgb A1c MFr Bld: 5.4 % (ref <5.7)
Mean Plasma Glucose: 108 mg/dL (ref <117)

## 2010-08-01 LAB — BASIC METABOLIC PANEL
BUN: 16 mg/dL (ref 6–23)
CO2: 26 mEq/L (ref 19–32)
Calcium: 9.3 mg/dL (ref 8.4–10.5)
Chloride: 103 mEq/L (ref 96–112)
Creatinine, Ser: 0.89 mg/dL (ref 0.4–1.2)
GFR calc Af Amer: >60 mL/min (ref >60)
GFR calc non Af Amer: >60 mL/min (ref >60)
Glucose, Bld: 160 mg/dL — ABNORMAL HIGH (ref 70–99)
Potassium: 3.8 mEq/L (ref 3.5–5.1)
Sodium: 140 mEq/L (ref 135–145)

## 2010-08-01 LAB — APTT: aPTT: 151 seconds — ABNORMAL HIGH (ref 24–37)

## 2010-08-01 LAB — CK TOTAL AND CKMB (NOT AT ARMC)
CK, MB: 5.3 ng/mL — ABNORMAL HIGH (ref 0.3–4.0)
Relative Index: 4 — ABNORMAL HIGH (ref 0.0–2.5)
Total CK: 133 U/L (ref 7–177)

## 2010-08-01 LAB — MAGNESIUM: Magnesium: 2 mg/dL (ref 1.5–2.5)

## 2010-08-01 LAB — TSH: TSH: 6.196 u[IU]/mL — ABNORMAL HIGH (ref 0.350–4.500)

## 2010-08-01 LAB — PROTIME-INR
INR: 1.04 (ref 0.00–1.49)
Prothrombin Time: 13.8 seconds (ref 11.6–15.2)

## 2010-08-01 LAB — MRSA PCR SCREENING: MRSA by PCR: NEGATIVE

## 2010-08-01 LAB — TROPONIN I: Troponin I: <0.3 ng/mL (ref <0.30)

## 2010-08-01 LAB — LIPASE, BLOOD: Lipase: 28 U/L (ref 11–59)

## 2010-08-01 LAB — POCT ACTIVATED CLOTTING TIME: Activated Clotting Time: 134 seconds

## 2010-08-02 ENCOUNTER — Inpatient Hospital Stay (HOSPITAL_COMMUNITY): Payer: Medicare Other

## 2010-08-02 LAB — CBC
HCT: 39.6 % (ref 36.0–46.0)
Hemoglobin: 13.3 g/dL (ref 12.0–15.0)
MCH: 30.5 pg (ref 26.0–34.0)
MCHC: 33.6 g/dL (ref 30.0–36.0)
MCV: 90.8 fL (ref 78.0–100.0)
Platelets: 272 10*3/uL (ref 150–400)
RBC: 4.36 MIL/uL (ref 3.87–5.11)
RDW: 12.5 % (ref 11.5–15.5)
WBC: 12.3 10*3/uL — ABNORMAL HIGH (ref 4.0–10.5)

## 2010-08-02 LAB — BASIC METABOLIC PANEL
BUN: 14 mg/dL (ref 6–23)
CO2: 26 mEq/L (ref 19–32)
Calcium: 9.3 mg/dL (ref 8.4–10.5)
Chloride: 106 mEq/L (ref 96–112)
Creatinine, Ser: 0.79 mg/dL (ref 0.4–1.2)
GFR calc Af Amer: >60 mL/min (ref >60)
GFR calc non Af Amer: >60 mL/min (ref >60)
Glucose, Bld: 123 mg/dL — ABNORMAL HIGH (ref 70–99)
Potassium: 3.9 mEq/L (ref 3.5–5.1)
Sodium: 141 mEq/L (ref 135–145)

## 2010-08-02 LAB — CEA: CEA: 4.2 ng/mL (ref 0.0–5.0)

## 2010-08-02 LAB — SEDIMENTATION RATE: Sed Rate: 4 mm/hr (ref 0–22)

## 2010-08-02 LAB — D-DIMER, QUANTITATIVE: D-Dimer, Quant: 0.41 ug/mL-FEU (ref 0.00–0.48)

## 2010-08-14 ENCOUNTER — Other Ambulatory Visit (HOSPITAL_COMMUNITY): Payer: Self-pay | Admitting: Family Medicine

## 2010-08-14 DIAGNOSIS — Z1231 Encounter for screening mammogram for malignant neoplasm of breast: Secondary | ICD-10-CM

## 2010-08-16 NOTE — Discharge Summary (Signed)
NAMEVINEY, ACOCELLA NO.:  [id number]  MEDICAL RECORD NO.:  [id number]  LOCATION:  6526                         FACILITY:  [facility]  PHYSICIAN:  Nanetta Batty, M.D.   DATE OF BIRTH:  1934-08-23  DATE OF ADMISSION:  07/31/2010 DATE OF DISCHARGE:  08/02/2010                              DISCHARGE SUMMARY   DISCHARGE DIAGNOSES: 1. Pericarditis with pericardial effusion. 2. Chest pain resolved with negative myocardial infarction and normal     coronary artery disease by cath. 3. Hypertension. 4. Contrast allergy, receiving steroids. 5. Mitral valve prolapse. 6. History of depression.  DISCHARGE CONDITION:  Improved and no further chest pain.  PROCEDURES:  Combined left heart cath by Dr. Nanetta Batty, Aug 01, 2010, with normal course and decreased pulmonary capillary wedge pressure.  DISCHARGE INSTRUCTIONS: 1. Increase activity slowly.  May shower.  No lifting for 1 week.  No     driving for 2 days. 2. Heart healthy diet. 3. Wash cath site with soap and water.  Call if any bleeding,     swelling, or drainage. 4. Follow up with Dr. Allyson Sabal August 07, 2010, at 4:00 p.m. 5. OV scheduled for followup echo of her heart.  The office will call     with date and time.  Please note, I will have her see Dr. Allyson Sabal in     a week just to ensure she is not having any other complaints or     symptoms.  DISCHARGE MEDICATIONS:  Please see medication reconciliation sheet.  HISTORY OF PRESENT ILLNESS:  A 76 year old female patient, previous patient of Dr. Elsie Lincoln, presented to the emergency room by ambulance on Jul 31, 2010, complaining of chest pain.  Previously, she had a normal cardiac catheterization.  Her symptoms began on Saturday with substernal chest discomfort radiating across her chest and into her jaw.  It occurred at rest and with activity.  Pain was persistent, and they would resolve when she would lie down to go to sleep.  On Sunday, she was able to carry out  normal activities but by the afternoon, she had some chest discomfort and decided to come to the emergency room.  Originally went to Urgent Care, was treated with sublingual nitro with some improvement but they returned within 20 minutes and they sent her to the emergency department.  EKG was without ST changes.  She was started on IV nitro, IV heparin.  She continued with some substernal chest discomfort.  She denied any shortness of breath, nausea, vomiting, or diaphoresis.  She was brought into the hospital to step-down with plans for cardiac cath.  PAST MEDICAL HISTORY:  Normal cath several years ago.  Also history of small bowel obstruction with resection, hypertension, depression, mitral valve prolapse, and arthritis.  FAMILY HISTORY:  Positive for coronary artery disease, though her father died at 14.  ALLERGIES:  CODEINE, ERYTHROMYCIN, IVP DYE, and propoxyphene.  Cardiac enzymes were negative.  She underwent cardiac cath which revealed normal coronary arteries, normal LV function, and normal aortic root.  She also had a 2-D echo done which showed moderate free flowing pericardial effusion without tamponade.  By the next day,  pain was resolved.  She had no further complaints.  Dr. Allyson Sabal felt treating the pericarditis with nonsteroidal.  We also did a CEA.  The sed rate was negative.  We got a CT of her chest which was normal except for the small pericardial effusion and probable gall bladder sludge and she will follow up with Dr. Allyson Sabal and will repeat her outpatient 2-D echo in 2-3 weeks.  She will go home on ibuprofen.  LABORATORY DATA:  Discharge hemoglobin 13.3, hematocrit 39.6, WBC became elevated at 12.3, but she is allergic to contrast and was given steroids.  Sed rate was 4.  D-dimer is low at 0.41.  Chemistry, sodium 141, potassium 3.9, chloride 106, CO2 26, glucose 123, BUN 14, creatinine 0.79.  LFTs, total bili was 0.3, alkaline phos 67, SGOT 29, SGPT 22, albumin  4.1, calcium was 10.4 to 9.3, and her magnesium was 2.  Hemoglobin A1c was 5.4.  Cardiac enzymes, CK range 173 to 122, MB 6.1 to 4.2, relative index was slightly elevated at 3.5 to 3.9, troponin Is were all negative at less than 0.30.  BNP was 85.  Total cholesterol 163, triglycerides 72, HDL 50, and LDL 99.  TSH slightly elevated at 6.196.  CEA is back and it is 4.2 within normal and MRSA screen was negative.  RADIOLOGY:  Chest x-ray on admission, no acute abnormality and CT of the chest done prior to discharge, minimal pleural parenchymal scarring involving the bases in the lower legs and no acute pulmonary disease. No pulmonary parenchymal nodules or masses, small pericardial effusion, probable gall bladder sludge.  A 2-D echo done on May 29, systolic function was normal, EF 60-65%.  No regional wall motion abnormalities.  Left atrium was mildly dilated. Inferior vena cava normal central venous pressure.  Pericardium small to moderate, free flowing, pericardial effusion was identified along the right ventricular free wall.  No internal echoes.  No evidence of hemodynamic compromise.  The patient will ambulate and be discharged home and will follow up as instructed with Dr. Allyson Sabal.     Darcella Gasman. Annie Paras, N.P.   ______________________________ Nanetta Batty, M.D.    LRI/MEDQ  D:  08/02/2010  T:  08/03/2010  Job:  [id number]  cc:   Dr. Margart Sickles  Electronically Signed by Nada Boozer N.P. on 08/14/2010 10:57:06 AM Electronically Signed by Nanetta Batty M.D. on 08/16/2010 10:45:09 AM

## 2010-08-16 NOTE — Cardiovascular Report (Signed)
NAMENORABELLE, KONDO NO.:  [id number]  MEDICAL RECORD NO.:  [id number]           PATIENT TYPE:  I  LOCATION:  6526                         FACILITY:  [facility]  PHYSICIAN:  Nanetta Batty, M.D.   DATE OF BIRTH:  Jul 25, 1934  DATE OF PROCEDURE: DATE OF DISCHARGE:                           CARDIAC CATHETERIZATION   Adriana [name] is a 76 year old married Caucasian female with positive risk factors including hypertension and family history.  She also has mitral valve prolapse.  She was admitted with chest pain.  EKG showed no acute changes.  She ruled out for myocardial infarction.  A 2-D echo showed moderate pericardial effusion without tamponade physiology.  She presents now for the right and left heart cath to define her anatomy and physiology and rule out ischemic etiology.  DESCRIPTION OF PROCEDURE:  The patient was brought to the Second Floor [facility] Lab in postabsorptive state.  She was premedicated with p.o. Valium, IV Benadryl, Pepcid, and Solu-Medrol for contrast allergy prophylaxis, IV fentanyl.  Her right groin was prepped and shaved in usual sterile fashion.  Xylocaine 1% was used for local anesthesia.  5-French sheaths were inserted into the right femoral artery and vein using standard Seldinger technique.  A 5-French balloon- tip Swan-Ganz catheter was used to obtain sequential right heart pressures.  5-French right and left Judkins diagnostic catheter as well as well as 5-French pigtail catheter were used for selective cholangiography, left ventriculography, and supravalvular aortography. Visipaque dye was used for the entirety of the case.  Retrograde aortic, left ventricular and pullback blood pressures were recorded.  HEMODYNAMIC RESULTS: 1. Aortic systolic pressure 182, diastolic pressure 101. 2. Left ventricular systolic pressure 184, end-diastolic pressure 22. 3. RA pressure A-wave 4, V-wave 2, and mean 1. 4. RV pressure systolic  29, end-diastolic pressure 2. 5. PA pressure systolic 27, diastolic pressure 4, and mean 12. 6. Pulmonary capillary wedge pressure A-wave 4, V-wave 4, and mean 2.  SELECTIVE CORONARY ANGIOGRAPHY: 1. Left main normal. 2. LAD normal. 3. Left circumflex normal. 4. Right coronary artery was dominant and normal.  Ventriculography; RAO left ventriculogram was performed using 25 mL Visipaque dye at 12 mL per second.  The overall LVEF was estimated greater than 60% without focal wall motion abnormalities.  Supravalvular aortography; the supravalvular aortogram was performed using 20 mL of Visipaque dye at 20 mL per second.  Aortic root was normal in caliber.  There was no dissection.  There is no AI.  Arch vessels were intact.  IMPRESSION:  Adriana [name] has essentially normal coronary artery, normal LV function, and normal supravalvular aorta and low filling pressures suggesting she is dry.  She certainly is not in tamponade.  The etiology of her chest pain is not cardiac.  She may have pericarditis.  Continued medical therapy will be recommended.  The sheaths were removed and pressure was held on the groin to achieve hemostasis.  The patient left the lab in stable condition.     Nanetta Batty, M.D.     Cordelia Pen  D:  08/01/2010  T:  08/02/2010  Job:  [id number]  cc:   Second [facility]  Electronically Signed by Nanetta Batty M.D. on 08/16/2010 10:45:06 AM

## 2010-08-22 ENCOUNTER — Ambulatory Visit (HOSPITAL_COMMUNITY)
Admission: RE | Admit: 2010-08-22 | Discharge: 2010-08-22 | Disposition: A | Discharge status: Home / Self Care | Payer: Medicare Other | Source: Ambulatory Visit | Attending: Family Medicine | Admitting: Family Medicine

## 2010-08-22 DIAGNOSIS — Z1231 Encounter for screening mammogram for malignant neoplasm of breast: Secondary | ICD-10-CM | POA: Insufficient documentation

## 2010-08-24 DIAGNOSIS — I209 Angina pectoris, unspecified: Secondary | ICD-10-CM

## 2010-08-24 HISTORY — DX: Angina pectoris, unspecified: I20.9

## 2010-09-12 NOTE — H&P (Signed)
NAME:  Adriana [name], Adriana [name]                   ACCOUNT NO.:  [id number]  MEDICAL RECORD NO.:  [id number]           PATIENT TYPE:  E  LOCATION:  [facility]                         FACILITY:  [facility]  PHYSICIAN:  [place] Keiana Tavella, MD         DATE OF BIRTH:  Dec 27, 1934  DATE OF ADMISSION:  07/31/2010 DATE OF DISCHARGE:                             HISTORY & PHYSICAL   CHIEF COMPLAINT:  Chest pain.  HISTORY OF PRESENT ILLNESS:  This is a very pleasant 76 year old white female with no previous history of coronary artery disease who presents to the emergency department with complaints of chest discomfort.  She reports that her symptoms began on Saturday with complaints of substernal chest discomfort radiating across her chest and into her jaw. Her symptoms occurred at rest as well as with activity on Saturday.  Her pain was persistent, although worse at times on Saturday than others but did finally abate once she laid down in bed to go to sleep Saturday night.  She awoke on Sunday, feeling like her normal self and was able to carry out her normal activities without any problems however.  This afternoon she began to experience same discomfort and decided to seek attention.  She and her husband went to Urgent Care for evaluation where she was treated with sublingual nitroglycerin.  She did report some improvement in her discomfort.  However, it returned within about 20 minutes after the administration of the nitroglycerin.  On arrival here in the emergency department, her EKG reveals sinus rhythm with no ischemic ST-T wave changes.  She has been started on IV nitroglycerin as well as IV heparin.  She does continue to experience some substernal chest discomfort.  She denies any associated shortness of breath.  No nausea or vomiting or diaphoresis, actually reports that she felt cold during these episodes.  She has not experienced any fevers or chills during this time.  She does occasionally experience heartburn  and indigestion.  Initially felt that her symptoms may be related to that. She has also had some problems with constipation.  She denies any orthopnea.  No PND or lower extremity edema.  Cardiac enzymes drawn in the emergency department revealed a CK of 173, CK-MB is 6.1 with an index of 3.5 and troponin of less than 0.30.  PAST MEDICAL HISTORY: 1. Negative cardiac catheterization done 15-20 years ago. 2. History of small bowel obstruction status post resection. 3. Hypertension. 4. Depression. 5. Mitral valve prolapse. 6. Arthritis.  FAMILY HISTORY:  Positive for coronary artery disease.  Her father died at age 24.  He had history of CABG.  SOCIAL HISTORY:  She is married.  Her husband is a retired Orthoptist here at [employer].  She denies any tobacco or alcohol use.  No illicit drugs.  ALLERGIES:  CODEINE, ERYTHROMYCIN, IVP DYE CONTRAST, PROPOXYPHENE.  CURRENT MEDICATIONS: 1. Sertraline 25 mg one-half tablet b.i.d. 2. Multivitamin daily. 3. Glucosamine daily. 4. Calcium carbonate 250 mg b.i.d. 5. Aspirin 81 mg daily. 6. Metoprolol 25 mg one-half tablet b.i.d. 7. Wellbutrin 150 mg 3 tablets daily.  REVIEW OF SYSTEMS:  As per HPI, otherwise negative.  PHYSICAL EXAMINATION:  VITAL SIGNS:  Blood pressure is 149/84, pulse is 62 and regular, respirations 17, pulse ox is 100%, temperature is 98.2. GENERAL:  This is a very pleasant 76 year old white female in no acute distress. HEENT:  Pupils are equal and reactive to light and accommodation. Extraocular movements intact. NECK:  Supple.  No JVD.  No carotid bruits. CARDIOVASCULAR:  Regular rate and rhythm.  S1 and S2 without appreciable murmur, gallop or rub. LUNGS:  Clear to auscultation bilaterally with normal respiratory effort. ABDOMEN:  Soft, nontender.  No hepatosplenomegaly or masses.  Bowel sounds present x3. EXTREMITIES:  Radial, femoral, dorsal pedal arteries present.  No lower extremity edema, clubbing, cyanosis or  ulcers.  NEUROLOGIC:  Oriented to person, place, and time.  Normal mood and affect.  LABORATORY DATA:  Chest x-ray revealed no acute abnormality.  CBC revealed a white cell count of 9.1, hemoglobin of 13.7, hematocrit of 40.6.  Sodium is 140, potassium is 4.0, BUN is 20, creatinine is 0.85. Alk phosphatase is 67.  Liver functions are normal.  CK 173, CK-MB 6.1 with an index of 3.5 and troponin of less than 0.30.  IMPRESSION: 1. Chest pain/acute coronary syndrome with elevated CK-MB. 2. Depression. 3. Hypertension. 4. Contrast allergy. 5. Unknown lipid status. 6. Mitral valve prolapse.  PLAN:  We will admit to step-down unit.  We will keep her n.p.o. after midnight for cardiac catheterization tomorrow to assess for any obstructive coronary artery disease.  We will cycle her enzymes through the night.  We will check a TSH, hemoglobin A1c, a BNP and magnesium level tonight.  We will obtain an echocardiogram to assess her LV function as well as for any structural abnormalities.  We will continue her beta-blocker therapy and continue with IV nitroglycerin and heparin.  We will also treat her with morphine for continued chest discomfort.  We discussed plan with her and her husband and both were agreeable.    ______________________________ [employer], NP   ______________________________ [place] Larren Copes, MD    LS/MEDQ  D:  08/01/2010  T:  08/01/2010  Job:  [id number]  cc:   Dr. Izell [facility]  Electronically Signed by Charmian Muff NP on 09/11/2010 09:52:50 PM Electronically Signed by Kirtland Bouchard. Tiffanni Scarfo M.D. on 09/12/2010 10:21:14 AM

## 2011-10-10 ENCOUNTER — Other Ambulatory Visit (HOSPITAL_COMMUNITY): Payer: Self-pay | Admitting: Family Medicine

## 2011-10-10 DIAGNOSIS — Z1231 Encounter for screening mammogram for malignant neoplasm of breast: Secondary | ICD-10-CM

## 2011-10-26 ENCOUNTER — Ambulatory Visit (HOSPITAL_COMMUNITY): Payer: Medicare Other

## 2011-11-12 ENCOUNTER — Ambulatory Visit (HOSPITAL_COMMUNITY)
Admission: RE | Admit: 2011-11-12 | Discharge: 2011-11-12 | Disposition: A | Discharge status: Home / Self Care | Payer: Medicare Other | Source: Ambulatory Visit | Attending: Family Medicine | Admitting: Family Medicine

## 2011-11-12 DIAGNOSIS — Z1231 Encounter for screening mammogram for malignant neoplasm of breast: Secondary | ICD-10-CM | POA: Insufficient documentation

## 2012-01-02 ENCOUNTER — Other Ambulatory Visit: Payer: Self-pay | Admitting: Dermatology

## 2012-02-08 ENCOUNTER — Ambulatory Visit: Payer: Medicare Other | Admitting: Physical Therapy

## 2012-02-15 ENCOUNTER — Ambulatory Visit: Payer: Medicare Other | Attending: Family Medicine | Admitting: Physical Therapy

## 2012-02-15 DIAGNOSIS — R262 Difficulty in walking, not elsewhere classified: Secondary | ICD-10-CM | POA: Insufficient documentation

## 2012-02-15 DIAGNOSIS — M6281 Muscle weakness (generalized): Secondary | ICD-10-CM | POA: Insufficient documentation

## 2012-02-15 DIAGNOSIS — IMO0001 Reserved for inherently not codable concepts without codable children: Secondary | ICD-10-CM | POA: Insufficient documentation

## 2012-02-18 ENCOUNTER — Ambulatory Visit: Payer: Medicare Other | Admitting: Physical Therapy

## 2012-02-21 ENCOUNTER — Encounter: Payer: Medicare Other | Admitting: Physical Therapy

## 2012-02-21 ENCOUNTER — Ambulatory Visit: Payer: Medicare Other | Admitting: Physical Therapy

## 2012-03-03 ENCOUNTER — Ambulatory Visit: Payer: Medicare Other | Admitting: Physical Therapy

## 2012-03-06 ENCOUNTER — Ambulatory Visit: Payer: Medicare Other | Attending: Family Medicine | Admitting: Physical Therapy

## 2012-03-06 DIAGNOSIS — R262 Difficulty in walking, not elsewhere classified: Secondary | ICD-10-CM | POA: Insufficient documentation

## 2012-03-06 DIAGNOSIS — IMO0001 Reserved for inherently not codable concepts without codable children: Secondary | ICD-10-CM | POA: Insufficient documentation

## 2012-03-06 DIAGNOSIS — M6281 Muscle weakness (generalized): Secondary | ICD-10-CM | POA: Insufficient documentation

## 2012-03-10 ENCOUNTER — Ambulatory Visit: Payer: Medicare Other | Admitting: Physical Therapy

## 2012-03-13 ENCOUNTER — Ambulatory Visit: Payer: Medicare Other | Admitting: Physical Therapy

## 2012-03-17 ENCOUNTER — Ambulatory Visit: Payer: Medicare Other | Admitting: Physical Therapy

## 2012-03-20 ENCOUNTER — Ambulatory Visit: Payer: Medicare Other | Admitting: Physical Therapy

## 2012-03-24 ENCOUNTER — Ambulatory Visit: Payer: Medicare Other | Admitting: Physical Therapy

## 2012-03-27 ENCOUNTER — Ambulatory Visit: Payer: Medicare Other | Admitting: Physical Therapy

## 2012-04-07 ENCOUNTER — Ambulatory Visit: Payer: Medicare Other | Admitting: Physical Therapy

## 2012-04-10 ENCOUNTER — Ambulatory Visit: Payer: Medicare Other | Admitting: Physical Therapy

## 2012-04-14 ENCOUNTER — Encounter: Payer: Medicare Other | Admitting: Physical Therapy

## 2012-04-17 ENCOUNTER — Ambulatory Visit: Payer: Medicare Other | Admitting: Physical Therapy

## 2012-05-07 ENCOUNTER — Encounter: Payer: Self-pay | Admitting: *Deleted

## 2012-06-02 ENCOUNTER — Encounter: Payer: Self-pay | Admitting: Cardiovascular Disease

## 2012-08-01 ENCOUNTER — Encounter (HOSPITAL_COMMUNITY): Payer: Self-pay | Admitting: *Deleted

## 2012-08-01 ENCOUNTER — Emergency Department (HOSPITAL_COMMUNITY): Payer: Medicare Other

## 2012-08-01 ENCOUNTER — Inpatient Hospital Stay (HOSPITAL_COMMUNITY)
Admission: EM | Admit: 2012-08-01 | Discharge: 2012-08-04 | DRG: 392 | Disposition: A | Discharge status: Home Health | Payer: Medicare Other | Attending: Internal Medicine | Admitting: Internal Medicine

## 2012-08-01 DIAGNOSIS — I6509 Occlusion and stenosis of unspecified vertebral artery: Secondary | ICD-10-CM | POA: Diagnosis present

## 2012-08-01 DIAGNOSIS — S12500A Unspecified displaced fracture of sixth cervical vertebra, initial encounter for closed fracture: Secondary | ICD-10-CM

## 2012-08-01 DIAGNOSIS — K5732 Diverticulitis of large intestine without perforation or abscess without bleeding: Principal | ICD-10-CM | POA: Diagnosis present

## 2012-08-01 DIAGNOSIS — W1809XA Striking against other object with subsequent fall, initial encounter: Secondary | ICD-10-CM | POA: Diagnosis present

## 2012-08-01 DIAGNOSIS — Z79899 Other long term (current) drug therapy: Secondary | ICD-10-CM

## 2012-08-01 DIAGNOSIS — K5792 Diverticulitis of intestine, part unspecified, without perforation or abscess without bleeding: Secondary | ICD-10-CM

## 2012-08-01 DIAGNOSIS — S129XXA Fracture of neck, unspecified, initial encounter: Secondary | ICD-10-CM

## 2012-08-01 DIAGNOSIS — S12600A Unspecified displaced fracture of seventh cervical vertebra, initial encounter for closed fracture: Secondary | ICD-10-CM

## 2012-08-01 DIAGNOSIS — Y9229 Other specified public building as the place of occurrence of the external cause: Secondary | ICD-10-CM

## 2012-08-01 DIAGNOSIS — M899 Disorder of bone, unspecified: Secondary | ICD-10-CM | POA: Diagnosis present

## 2012-08-01 HISTORY — DX: Unspecified osteoarthritis, unspecified site: M19.90

## 2012-08-01 HISTORY — DX: Other specified disorders of bone density and structure, unspecified site: M85.80

## 2012-08-01 LAB — COMPREHENSIVE METABOLIC PANEL
ALT: 29 U/L (ref 0–35)
AST: 33 U/L (ref 0–37)
Albumin: 3.8 g/dL (ref 3.5–5.2)
Alkaline Phosphatase: 76 U/L (ref 39–117)
BUN: 18 mg/dL (ref 6–23)
CO2: 28 mEq/L (ref 19–32)
Calcium: 9.7 mg/dL (ref 8.4–10.5)
Chloride: 98 mEq/L (ref 96–112)
Creatinine, Ser: 0.97 mg/dL (ref 0.50–1.10)
GFR calc Af Amer: 65 mL/min — ABNORMAL LOW (ref >90)
GFR calc non Af Amer: 56 mL/min — ABNORMAL LOW (ref >90)
Glucose, Bld: 102 mg/dL — ABNORMAL HIGH (ref 70–99)
Potassium: 3.7 mEq/L (ref 3.5–5.1)
Sodium: 136 mEq/L (ref 135–145)
Total Bilirubin: 0.6 mg/dL (ref 0.3–1.2)
Total Protein: 7.8 g/dL (ref 6.0–8.3)

## 2012-08-01 LAB — CBC WITH DIFFERENTIAL/PLATELET
Basophils Absolute: 0 10*3/uL (ref 0.0–0.1)
Basophils Relative: 0 % (ref 0–1)
Eosinophils Absolute: 0.1 10*3/uL (ref 0.0–0.7)
Eosinophils Relative: 1 % (ref 0–5)
HCT: 39.5 % (ref 36.0–46.0)
Hemoglobin: 13.4 g/dL (ref 12.0–15.0)
Lymphocytes Relative: 6 % — ABNORMAL LOW (ref 12–46)
Lymphs Abs: 0.9 10*3/uL (ref 0.7–4.0)
MCH: 30.9 pg (ref 26.0–34.0)
MCHC: 33.9 g/dL (ref 30.0–36.0)
MCV: 91.2 fL (ref 78.0–100.0)
Monocytes Absolute: 1 10*3/uL (ref 0.1–1.0)
Monocytes Relative: 7 % (ref 3–12)
Neutro Abs: 12.2 10*3/uL — ABNORMAL HIGH (ref 1.7–7.7)
Neutrophils Relative %: 86 % — ABNORMAL HIGH (ref 43–77)
Platelets: 297 10*3/uL (ref 150–400)
RBC: 4.33 MIL/uL (ref 3.87–5.11)
RDW: 12.3 % (ref 11.5–15.5)
WBC: 14.2 10*3/uL — ABNORMAL HIGH (ref 4.0–10.5)

## 2012-08-01 LAB — URINE MICROSCOPIC-ADD ON

## 2012-08-01 LAB — URINALYSIS, ROUTINE W REFLEX MICROSCOPIC
Glucose, UA: NEGATIVE mg/dL
Ketones, ur: 40 mg/dL — AB
Nitrite: NEGATIVE
Protein, ur: 30 mg/dL — AB
Specific Gravity, Urine: 1.031 — ABNORMAL HIGH (ref 1.005–1.030)
Urobilinogen, UA: 1 mg/dL (ref 0.0–1.0)
pH: 6 (ref 5.0–8.0)

## 2012-08-01 LAB — LIPASE, BLOOD: Lipase: 18 U/L (ref 11–59)

## 2012-08-01 MED ORDER — METRONIDAZOLE IN NACL 5-0.79 MG/ML-% IV SOLN
500.0000 mg | Freq: Three times a day (TID) | INTRAVENOUS | Status: DC
  Administered 2012-08-02 – 2012-08-03 (×4): 500 mg via INTRAVENOUS
  Filled 2012-08-01 (×5): qty 100

## 2012-08-01 MED ORDER — MORPHINE SULFATE 4 MG/ML IJ SOLN
4.0000 mg | Freq: Once | INTRAMUSCULAR | Status: AC
  Administered 2012-08-01: 4 mg via INTRAVENOUS
  Filled 2012-08-01: qty 1

## 2012-08-01 MED ORDER — CIPROFLOXACIN IN D5W 400 MG/200ML IV SOLN
400.0000 mg | Freq: Two times a day (BID) | INTRAVENOUS | Status: DC
  Administered 2012-08-02 (×2): 400 mg via INTRAVENOUS
  Filled 2012-08-01 (×3): qty 200

## 2012-08-01 MED ORDER — CALCIUM CARBONATE-VITAMIN D 500-200 MG-UNIT PO TABS
1.0000 | ORAL_TABLET | Freq: Two times a day (BID) | ORAL | Status: DC
  Administered 2012-08-01 – 2012-08-04 (×6): 1 via ORAL
  Filled 2012-08-01 (×7): qty 1

## 2012-08-01 MED ORDER — ONDANSETRON HCL 4 MG/2ML IJ SOLN: 4.0000 mg | Freq: Four times a day (QID) | INTRAMUSCULAR | Status: DC | PRN

## 2012-08-01 MED ORDER — SODIUM CHLORIDE 0.9 % IV BOLUS (SEPSIS)
1000.0000 mL | Freq: Once | INTRAVENOUS | Status: AC
  Administered 2012-08-01: 1000 mL via INTRAVENOUS

## 2012-08-01 MED ORDER — ASPIRIN EC 81 MG PO TBEC
81.0000 mg | DELAYED_RELEASE_TABLET | Freq: Every day | ORAL | Status: DC
  Administered 2012-08-02 – 2012-08-04 (×3): 81 mg via ORAL
  Filled 2012-08-01 (×3): qty 1

## 2012-08-01 MED ORDER — METRONIDAZOLE IN NACL 5-0.79 MG/ML-% IV SOLN
500.0000 mg | Freq: Once | INTRAVENOUS | Status: AC
Start: 2012-08-01 — End: 2012-08-01
  Administered 2012-08-01: 500 mg via INTRAVENOUS
  Filled 2012-08-01: qty 100

## 2012-08-01 MED ORDER — DEXTROSE-NACL 5-0.9 % IV SOLN
INTRAVENOUS | Status: DC
  Administered 2012-08-02 – 2012-08-04 (×5): via INTRAVENOUS

## 2012-08-01 MED ORDER — GADOBENATE DIMEGLUMINE 529 MG/ML IV SOLN
15.0000 mL | Freq: Once | INTRAVENOUS | Status: AC | PRN
  Administered 2012-08-01: 15 mL via INTRAVENOUS

## 2012-08-01 MED ORDER — NITROGLYCERIN 0.4 MG SL SUBL: 0.4000 mg | SUBLINGUAL_TABLET | SUBLINGUAL | Status: DC | PRN

## 2012-08-01 MED ORDER — ONDANSETRON HCL 4 MG PO TABS: 4.0000 mg | ORAL_TABLET | Freq: Four times a day (QID) | ORAL | Status: DC | PRN

## 2012-08-01 MED ORDER — METOPROLOL SUCCINATE 12.5 MG HALF TABLET
12.5000 mg | ORAL_TABLET | Freq: Two times a day (BID) | ORAL | Status: DC
Start: 2012-08-01 — End: 2012-08-04
  Administered 2012-08-01 – 2012-08-02 (×3): 12.5 mg via ORAL
  Administered 2012-08-03: 22:00:00 via ORAL
  Administered 2012-08-03 – 2012-08-04 (×2): 12.5 mg via ORAL
  Filled 2012-08-01 (×7): qty 1

## 2012-08-01 MED ORDER — MORPHINE SULFATE 2 MG/ML IJ SOLN
2.0000 mg | INTRAMUSCULAR | Status: DC | PRN
  Administered 2012-08-01 – 2012-08-04 (×14): 2 mg via INTRAVENOUS
  Filled 2012-08-01 (×14): qty 1

## 2012-08-01 MED ORDER — BUPROPION HCL ER (SR) 150 MG PO TB12
450.0000 mg | ORAL_TABLET | Freq: Every morning | ORAL | Status: DC
  Administered 2012-08-02 – 2012-08-04 (×3): 450 mg via ORAL
  Filled 2012-08-01 (×3): qty 3

## 2012-08-01 MED ORDER — CIPROFLOXACIN IN D5W 400 MG/200ML IV SOLN
400.0000 mg | Freq: Once | INTRAVENOUS | Status: AC
  Administered 2012-08-01: 400 mg via INTRAVENOUS
  Filled 2012-08-01: qty 200

## 2012-08-01 NOTE — ED Provider Notes (Signed)
History     CSN: [id number]  Arrival date & time 08/01/12  1321   First MD Initiated Contact with Patient 08/01/12 1330      Chief Complaint  Patient presents with  . Fall    (Consider location/radiation/quality/duration/timing/severity/associated sxs/prior treatment) HPI Comments: 78 year old female with  Very little prior medical history who complains of 2 problems  #1 abdominal pain: The patient presented to her family doctor's office today with abdominal pain which has been lower in location, bilateral, intermittent but gradually getting worse and associated with bloating. She has no nausea vomiting diarrhea or dysuria. She reports having unexplained abdominal pain in the last couple of years for which she had several workups and finally an exploratory laparotomy, no definite etiology per the patient.  #2 head injury: While the patient was obtaining a urine sample for her family doctor at the family Dr. office she got off the toilet and fell forward striking the top of her head on the wall, forced her neck into hyper extension and had trouble getting up off the ground. She suffered a hematoma to the top of her head and was immobilized at the office, transported by EMS.  The patient does have chronic problems with balance and has had frequent falls for which she is being currently evaluated with no definite etiology. The fall that occurred at the office is not unusual.  The history is provided by the patient.    Past Medical History  Diagnosis Date  . Anginal pain 08/24/2010    Echo-EF =>55%,LV normal  . Chest pain, unspecified 09/04/2007    Lexiscan-- EF 72%; LV normal    Past Surgical History  Procedure Laterality Date  . Cardiac catheterization  08/01/2010    normal coronaries    Family History  Problem Relation Age of Onset  . Hypertension Mother   . Transient ischemic attack Mother   . Diabetes Father   . Coronary artery disease Father   . Prostate cancer Brother      History  Substance Use Topics  . Smoking status: Never Smoker   . Smokeless tobacco: Not on file  . Alcohol Use: Yes     Comment: occasionally    OB History   Grav Para Term Preterm Abortions TAB SAB Ect Mult Living                  Review of Systems  All other systems reviewed and are negative.    Allergies  Codeine; Contrast media; Darvocet; Erythrocin; and Iodine  Home Medications   Current Outpatient Rx  Name  Route  Sig  Dispense  Refill  . alendronate (FOSAMAX) 70 MG tablet   Oral   Take 70 mg by mouth every 7 (seven) days. Take with a full glass of water on an empty stomach.  Taken on Sundays.         Marland Kitchen aspirin EC 81 MG tablet   Oral   Take 81 mg by mouth daily.         Marland Kitchen buPROPion (WELLBUTRIN SR) 150 MG 12 hr tablet   Oral   Take 450 mg by mouth every morning.          . calcium-vitamin D (OSCAL WITH D) 500-200 MG-UNIT per tablet   Oral   Take 1 tablet by mouth 2 (two) times daily.         Marland Kitchen glucosamine-chondroitin 500-400 MG tablet   Oral   Take 1 tablet by mouth daily.         Marland Kitchen  metoprolol succinate (TOPROL-XL) 25 MG 24 hr tablet   Oral   Take 12.5 mg by mouth 2 (two) times daily.          . Multiple Vitamin (MULTIVITAMIN WITH MINERALS) TABS   Oral   Take 1 tablet by mouth daily.         . nitroGLYCERIN (NITROSTAT) 0.4 MG SL tablet   Sublingual   Place 0.4 mg under the tongue every 5 (five) minutes as needed for chest pain.           BP 158/70  Pulse 79  Temp(Src) 98.4 F (36.9 C) (Oral)  Resp 18  SpO2 95%  Physical Exam  Nursing note and vitals reviewed. Constitutional: She appears well-developed and well-nourished. No distress.  HENT:  Head: Normocephalic.  Mouth/Throat: Oropharynx is clear and moist. No oropharyngeal exudate.  4 cm hematoma to the crown of the head  Eyes: Conjunctivae and EOM are normal. Pupils are equal, round, and reactive to light. Right eye exhibits no discharge. Left eye exhibits no  discharge. No scleral icterus.  Neck: No JVD present. No thyromegaly present.  Cardiovascular: Normal rate, regular rhythm, normal heart sounds and intact distal pulses.  Exam reveals no gallop and no friction rub.   No murmur heard. Pulmonary/Chest: Effort normal and breath sounds normal. No respiratory distress. She has no wheezes. She has no rales.  Abdominal: Soft. Bowel sounds are normal. She exhibits distension. She exhibits no mass. There is tenderness.  Mild abdominal distention, tympanitic sounds to percussion in the lower abdomen, mild tenderness with mild guarding in the lower abdomen, no upper abdominal tenderness, no peritoneal signs  Musculoskeletal: Normal range of motion. She exhibits tenderness. She exhibits no edema.  Mild tenderness to palpation of the cervical spine in the paracervical muscles  Lymphadenopathy:    She has no cervical adenopathy.  Neurological: She is alert. Coordination normal.  Patient is able to lift in a straight leg fashion both legs off the bed without difficulty, normal grips bilaterally, normal speech  Skin: Skin is warm and dry. No rash noted. No erythema.  Psychiatric: She has a normal mood and affect. Her behavior is normal.    ED Course  Procedures (including critical care time)  Labs Reviewed  CBC WITH DIFFERENTIAL  COMPREHENSIVE METABOLIC PANEL  LIPASE, BLOOD  URINALYSIS, ROUTINE W REFLEX MICROSCOPIC   No results found.   No diagnosis found.    MDM  The patient has abdominal pain but also has minor head trauma with neck injury. CT scan imaging of the head and neck and ordered, labs for the abdomen and ordered including urinalysis, she may need a CT scan of the abdomen to evaluate for early bowel obstruction, ileus or other sources of abdominal pain. Her vital signs appear stable at this time. It is not a surgical abdomen at this time.   D/w Dr. Silverio Lay - oncoming EDP - will assume care of pt at this time.       Vida Roller,  MD 08/01/12 1535

## 2012-08-01 NOTE — ED Notes (Signed)
Pt in X ray

## 2012-08-01 NOTE — ED Notes (Signed)
Per ems: pt coming from PCP d/t abd pain - was in restroom giving urine sample, stood up and fell, hit front of head. Hematoma to forehead. Denies LOC, c/o neck/shoulder pain. Has been having balance issues (possible inner ear), also c/o generalized fatigue. bp 160/88, pulse 76, respirations 18

## 2012-08-01 NOTE — H&P (Signed)
[facility] History and Physical  Adriana [name] ZDG:[id number] DOB: 05-01-1934    PCP:   Gweneth Dimitri, MD   Chief Complaint: fell and hit her head at PCP's office.  HPI: Adriana [name] is an 78 y.o. female with hx of bowel obtruction s/p abdominal surgery, osteopenia on Fosamax, presents to her PCP with abdominal pain and nausea, vomiting.  In the office, while obtaining a UA, she fell hard and hit her head.  There was no loss of consciousness.  Evaluation in the ER included a head CT, cervical CT, abdominal and pelvic CT along with lab work.  She was found to have a C6 and C7 fractures, along with evidence of diverticulitis of the sigmoid colon.  Neurosurgical consultation by EDP with Dr Phoebe Perch, who recommended a MRA of the neck to exclude vetebral artery dissection, which was negative.  He further recommended an Aspen's collar to be placed and worn at all time until he sees her outpatient for follow up.  Her serology was unremarkable with no leukocytosis, normal renal fx tests, and electrolytes.  Her last colonoscopy was about three years ago.  She lives at home with her husband.  Hospitalist was asked to admit her for diverticulitis.  Rewiew of Systems:  Constitutional: Negative for malaise, fever and chills. No significant weight loss or weight gain Eyes: Negative for eye pain, redness and discharge, diplopia, visual changes, or flashes of light. ENMT: Negative for ear pain, hoarseness, nasal congestion, sinus pressure and sore throat. No headaches; tinnitus, drooling, or problem swallowing. Cardiovascular: Negative for chest pain, palpitations, diaphoresis, dyspnea and peripheral edema. ; No orthopnea, PND Respiratory: Negative for cough, hemoptysis, wheezing and stridor. No pleuritic chestpain. Gastrointestinal: Negative melena, blood in stool, hematemesis, jaundice and rectal bleeding.    Genitourinary: Negative for frequency, dysuria, incontinence,flank pain and  hematuria; Musculoskeletal: Negative for back pain. Negative for swelling and trauma.; She has shoulder pain. Skin: . Negative for pruritus, rash, abrasions, bruising and skin lesion.; ulcerations Neuro: Negative for headache,  and neck stiffness. Negative for weakness, altered level of consciousness , altered mental status, extremity weakness, burning feet, involuntary movement, seizure and syncope.  Psych: negative for anxiety, depression, insomnia, tearfulness, panic attacks, hallucinations, paranoia, suicidal or homicidal ideation    Past Medical History  Diagnosis Date  . Anginal pain 08/24/2010    Echo-EF =>55%,LV normal  . Chest pain, unspecified 09/04/2007    Lexiscan-- EF 72%; LV normal  . Osteopenia   . Arthritis     Past Surgical History  Procedure Laterality Date  . Cardiac catheterization  08/01/2010    normal coronaries  . Colonoscopy  approx 2011  . Intestinal blockage surgery  Feb. 2012    "kink in small intestine" per pt    Medications:  HOME MEDS: Prior to Admission medications   Medication Sig Start Date End Date Taking? Authorizing Provider  alendronate (FOSAMAX) 70 MG tablet Take 70 mg by mouth every 7 (seven) days. Take with a full glass of water on an empty stomach.  Taken on Sundays.   Yes Historical Provider, MD  aspirin EC 81 MG tablet Take 81 mg by mouth daily.   Yes Historical Provider, MD  buPROPion (WELLBUTRIN SR) 150 MG 12 hr tablet Take 450 mg by mouth every morning.    Yes Historical Provider, MD  calcium-vitamin D (OSCAL WITH D) 500-200 MG-UNIT per tablet Take 1 tablet by mouth 2 (two) times daily.   Yes Historical Provider, MD  glucosamine-chondroitin 500-400 MG tablet Take  1 tablet by mouth daily.   Yes Historical Provider, MD  metoprolol succinate (TOPROL-XL) 25 MG 24 hr tablet Take 12.5 mg by mouth 2 (two) times daily.    Yes Historical Provider, MD  Multiple Vitamin (MULTIVITAMIN WITH MINERALS) TABS Take 1 tablet by mouth daily.   Yes  Historical Provider, MD  nitroGLYCERIN (NITROSTAT) 0.4 MG SL tablet Place 0.4 mg under the tongue every 5 (five) minutes as needed for chest pain.   Yes Historical Provider, MD     Allergies:  Allergies  Allergen Reactions  . Codeine   . Contrast Media (Iodinated Diagnostic Agents)   . Darvocet (Propoxyphene-Acetaminophen) Diarrhea and Nausea And Vomiting  . Erythrocin   . Iodine     Social History:   reports that she has never smoked. She has never used smokeless tobacco. She reports that  drinks alcohol. She reports that she does not use illicit drugs.  Family History: Family History  Problem Relation Age of Onset  . Hypertension Mother   . Transient ischemic attack Mother   . Diabetes Father   . Coronary artery disease Father   . Prostate cancer Brother      Physical Exam: Filed Vitals:   08/01/12 1330 08/01/12 2009  BP: 158/70 160/85  Pulse: 79 89  Temp: 98.4 F (36.9 C)   TempSrc: Oral   Resp: 18 16  SpO2: 95% 96%   Blood pressure 160/85, pulse 89, temperature 98.4 F (36.9 C), temperature source Oral, resp. rate 16, SpO2 96.00%.  GEN:  Pleasant  patient lying in the stretcher in no acute distress; cooperative with exam. PSYCH:  alert and oriented x4; does not appear anxious or depressed; affect is appropriate. HEENT: Mucous membranes pink and anicteric; PERRLA; EOM intact; no cervical lymphadenopathy nor thyromegaly or carotid bruit; no JVD; There were no stridor. Neck is very supple. Breasts:: Not examined CHEST WALL: No tenderness CHEST: Normal respiration, clear to auscultation bilaterally.  HEART: Regular rate and rhythm.  There are no murmur, rub, or gallops.   BACK: No kyphosis or scoliosis; no CVA tenderness ABDOMEN: soft and non-tender; no masses, no organomegaly, normal abdominal bowel sounds; no pannus; no intertriginous candida. There is no rebound and no distention. Rectal Exam: Not done EXTREMITIES: No bone or joint deformity; age-appropriate  arthropathy of the hands and knees; no edema; no ulcerations.  There is no calf tenderness. She has tenderness over her left shoulder. Genitalia: not examined PULSES: 2+ and symmetric SKIN: Normal hydration no rash or ulceration CNS: Cranial nerves 2-12 grossly intact no focal lateralizing neurologic deficit.  Speech is fluent; uvula elevated with phonation, facial symmetry and tongue midline. DTR are normal bilaterally, cerebella exam is intact, barbinski is negative and strengths are equaled bilaterally.  No sensory loss.   Labs on Admission:  Basic Metabolic Panel:  Recent Labs Lab 08/01/12 1410  NA 136  K 3.7  CL 98  CO2 28  GLUCOSE 102*  BUN 18  CREATININE 0.97  CALCIUM 9.7   Liver Function Tests:  Recent Labs Lab 08/01/12 1410  AST 33  ALT 29  ALKPHOS 76  BILITOT 0.6  PROT 7.8  ALBUMIN 3.8    Recent Labs Lab 08/01/12 1410  LIPASE 18   No results found for this basename: AMMONIA,  in the last 168 hours CBC:  Recent Labs Lab 08/01/12 1410  WBC 14.2*  NEUTROABS 12.2*  HGB 13.4  HCT 39.5  MCV 91.2  PLT 297   Cardiac Enzymes: No results found for  this basename: CKTOTAL, CKMB, CKMBINDEX, TROPONINI,  in the last 168 hours  CBG: No results found for this basename: GLUCAP,  in the last 168 hours   Radiological Exams on Admission: Ct Abdomen Pelvis Wo Contrast  08/01/2012   *RADIOLOGY REPORT*  Clinical Data: Abdominal pain.  CT ABDOMEN AND PELVIS WITHOUT CONTRAST  Technique:  Multidetector CT imaging of the abdomen and pelvis was performed following the standard protocol without intravenous contrast.  Comparison: 04/23/2008  Findings: Inflammatory process in the left lower quadrant of the peritoneal cavity present with thickening of a loop of sigmoid colon and also an adjacent small bowel loop.  Diffuse diverticula are present emanating from the sigmoid colon and this most likely is representative of acute diverticulitis with adjacent inflammation of small  bowel.  No extraluminal air or focal abscess is identified.  Adjacent inflammatory changes are present in the mesenteric fat.  Other bowel loops are unremarkable.  Unenhanced appearance of the solid organs is stable including a left-sided renal cyst.  No masses or enlarged lymph nodes are seen.  No hernias are identified.  Bony structures show some progression of spondylosis of the lumbar spine with significant disc space narrowing present at all levels and an associated grade 1 anterolisthesis of L5 on S1.  IMPRESSION: Findings most likely consistent with acute sigmoid diverticulitis with associated adjacent inflammation of a small bowel loop.  No focal abscess is identified.   Original Report Authenticated By: Irish Lack, M.D.   Ct Head Wo Contrast  08/01/2012   *RADIOLOGY REPORT*  Clinical Data:  Headache and trauma.  Complains of neck pain. Forehead hematoma.  CT HEAD WITHOUT CONTRAST CT CERVICAL SPINE WITHOUT CONTRAST  Technique:  Multidetector CT imaging of the head and cervical spine was performed following the standard protocol without intravenous contrast.  Multiplanar CT image reconstructions of the cervical spine were also generated.  Comparison:   None  CT HEAD  Findings: There is high-density material along the posterior falx, particularly on the left side.  Findings raise concern for a small subdural hematoma.  The patient has diffuse atrophy and there is diffuse low density in the periventricular and subcortical white matter suggesting chronic changes.  There is no significant mass effect, midline shift or hydrocephalus.  Paranasal sinuses and mastoid air cells are clear.  No evidence for a calvarial fracture.  IMPRESSION: Concern for a very small subdural hematoma along the posterior falx.  Diffuse low density in the white matter suggests chronic small vessel ischemic changes.  CT CERVICAL SPINE  Findings:    There is a minimally-displaced fracture involving the C6 left pedicle with extension  into the transverse foramen.  There is also a fracture of the C6 left lamina. Displaced fracture of the left C7 superior facet.  Mild soft tissue swelling adjacent to these fractures.  Mild heterogeneity of the right thyroid lobe.  No evidence for an apical pneumothorax.  There is marked degenerative disc disease at C5-C6.  Mild anterolisthesis at C4-C5 is likely degenerative in etiology. Alignment of the cervicothoracic junction is within normal limits.  IMPRESSION: Fractures of the C6 left pedicle and C6 left lamina.  Fracture extends into the left C6 transverse foramen.  There is also a fracture of the C7 left superior facet.  A CTA or MRA of the neck would be appropriate to exclude an injury or dissection to the left vertebral artery.  Critical Value/emergent results were called by telephone at the time of interpretation on 08/01/2012 at 4:53 p.m. to  Dr. Thurnell Lose, who verbally acknowledged these results.   Original Report Authenticated By: Richarda Overlie, M.D.   Ct Cervical Spine Wo Contrast  08/01/2012   *RADIOLOGY REPORT*  Clinical Data:  Headache and trauma.  Complains of neck pain. Forehead hematoma.  CT HEAD WITHOUT CONTRAST CT CERVICAL SPINE WITHOUT CONTRAST  Technique:  Multidetector CT imaging of the head and cervical spine was performed following the standard protocol without intravenous contrast.  Multiplanar CT image reconstructions of the cervical spine were also generated.  Comparison:   None  CT HEAD  Findings: There is high-density material along the posterior falx, particularly on the left side.  Findings raise concern for a small subdural hematoma.  The patient has diffuse atrophy and there is diffuse low density in the periventricular and subcortical white matter suggesting chronic changes.  There is no significant mass effect, midline shift or hydrocephalus.  Paranasal sinuses and mastoid air cells are clear.  No evidence for a calvarial fracture.  IMPRESSION: Concern for a very small subdural  hematoma along the posterior falx.  Diffuse low density in the white matter suggests chronic small vessel ischemic changes.  CT CERVICAL SPINE  Findings:    There is a minimally-displaced fracture involving the C6 left pedicle with extension into the transverse foramen.  There is also a fracture of the C6 left lamina. Displaced fracture of the left C7 superior facet.  Mild soft tissue swelling adjacent to these fractures.  Mild heterogeneity of the right thyroid lobe.  No evidence for an apical pneumothorax.  There is marked degenerative disc disease at C5-C6.  Mild anterolisthesis at C4-C5 is likely degenerative in etiology. Alignment of the cervicothoracic junction is within normal limits.  IMPRESSION: Fractures of the C6 left pedicle and C6 left lamina.  Fracture extends into the left C6 transverse foramen.  There is also a fracture of the C7 left superior facet.  A CTA or MRA of the neck would be appropriate to exclude an injury or dissection to the left vertebral artery.  Critical Value/emergent results were called by telephone at the time of interpretation on 08/01/2012 at 4:53 p.m. to Dr. Thurnell Lose, who verbally acknowledged these results.   Original Report Authenticated By: Richarda Overlie, M.D.   Mr [facility] Wo Contrast  08/01/2012   *RADIOLOGY REPORT*  Clinical Data:  Abnormal CT of the head.  Cervical spine fractures. Fracture through the vertebral canal.  MRI HEAD WITHOUT AND WITH CONTRAST MRA HEAD WITHOUT CONTRAST MRA NECK WITHOUT AND WITH CONTRAST  Technique:  Multiplanar, multiecho pulse sequences of the brain and surrounding structures were obtained without and with intravenous contrast.  Angiographic images of the Circle of Willis were obtained using MRA technique without intravenous contrast. Angiographic images of the neck were obtained using MRA technique without and with intravenous contrast.  Carotid stenosis measurements (when applicable) are obtained utilizing NASCET criteria, using the distal  internal carotid diameter as the denominator.  Contrast: 15mL MULTIHANCE GADOBENATE DIMEGLUMINE 529 MG/ML IV SOLN  Comparison:  CT head and cervical spine from the same day.  MRI HEAD  Findings:  No acute infarct, hemorrhage, or mass lesion is present. Moderate generalized atrophy diffuse white matter disease is evident.  The ventricles are proportionate to the degree of atrophy.  Flow is present in the major intracranial arteries.  The patient is status post bilateral lens extractions.  The globes and orbits are otherwise intact.  Mild mucosal thickening is present in the ethmoid air cells bilaterally.  The postcontrast images demonstrate  no pathologic enhancement.  IMPRESSION:  1.  No acute intracranial abnormality. 2.  Moderate atrophy and generalized white matter disease.  This likely reflects the sequelae of chronic microvascular ischemia. 3.  Minimal sinus disease.  MRA HEAD  Findings: The internal carotid arteries are within normal limits from the high cervical segments through the ICA termini bilaterally.  There is early bifurcation of the left middle cerebral artery, a normal variant.  The A1 and M1 segments are otherwise within normal limits.  The anterior communicating artery is patent.  Mild distal branch vessel disease is evident bilaterally.  A left M2 segment is occluded approximately 15 mm from its origin.  The left vertebral artery is slightly dominant to the right. Prominent AICA vessels are noted bilaterally.  Both posterior cerebral arteries originate from basilar tip.  A prominent left posterior communicating artery is present.  There is mild attenuation of distal PCA branch vessels.  IMPRESSION:  1.  Mild distal small vessel disease. 2.  Question occlusion of a left M2 segment approximately 15 mm from the bifurcation.  MRA NECK  Findings: There is no significant to flow disturbance at either carotid bifurcation.  Flow is antegrade within the vertebral arteries bilaterally.  There is a common  origin of the innominate artery and left common carotid artery.  The vertebral arteries both originate from the subclavian arteries. There is moderate tortuosity in the proximal vertebral arteries. The vessel caliber is maintained.  There is no evidence for dissection or obstruction.  The right common carotid artery is within normal limits.  The carotid bifurcation is unremarkable.  There is mild tortuosity of the cervical right ICA without significant stenosis.  The left common carotid artery is within normal limits.  The bifurcation is unremarkable.  There is mild tortuosity of the proximal left ICA without significant stenosis.  IMPRESSION:  1.  No evidence for vertebral artery dissection or obstruction. 2.  Normal appearance of the carotid bifurcations bilaterally. 3.  Tortuosity of the internal carotid arteries bilaterally may be related to hypertension.  There is no associated stenosis.   Original Report Authenticated By: Marin Roberts, M.D.   Mr Angiogram Neck W Wo Contrast  08/01/2012   *RADIOLOGY REPORT*  Clinical Data:  Abnormal CT of the head.  Cervical spine fractures. Fracture through the vertebral canal.  MRI HEAD WITHOUT AND WITH CONTRAST MRA HEAD WITHOUT CONTRAST MRA NECK WITHOUT AND WITH CONTRAST  Technique:  Multiplanar, multiecho pulse sequences of the brain and surrounding structures were obtained without and with intravenous contrast.  Angiographic images of the Circle of Willis were obtained using MRA technique without intravenous contrast. Angiographic images of the neck were obtained using MRA technique without and with intravenous contrast.  Carotid stenosis measurements (when applicable) are obtained utilizing NASCET criteria, using the distal internal carotid diameter as the denominator.  Contrast: 15mL MULTIHANCE GADOBENATE DIMEGLUMINE 529 MG/ML IV SOLN  Comparison:  CT head and cervical spine from the same day.  MRI HEAD  Findings:  No acute infarct, hemorrhage, or mass lesion  is present. Moderate generalized atrophy diffuse white matter disease is evident.  The ventricles are proportionate to the degree of atrophy.  Flow is present in the major intracranial arteries.  The patient is status post bilateral lens extractions.  The globes and orbits are otherwise intact.  Mild mucosal thickening is present in the ethmoid air cells bilaterally.  The postcontrast images demonstrate no pathologic enhancement.  IMPRESSION:  1.  No acute intracranial abnormality. 2.  Moderate  atrophy and generalized white matter disease.  This likely reflects the sequelae of chronic microvascular ischemia. 3.  Minimal sinus disease.  MRA HEAD  Findings: The internal carotid arteries are within normal limits from the high cervical segments through the ICA termini bilaterally.  There is early bifurcation of the left middle cerebral artery, a normal variant.  The A1 and M1 segments are otherwise within normal limits.  The anterior communicating artery is patent.  Mild distal branch vessel disease is evident bilaterally.  A left M2 segment is occluded approximately 15 mm from its origin.  The left vertebral artery is slightly dominant to the right. Prominent AICA vessels are noted bilaterally.  Both posterior cerebral arteries originate from basilar tip.  A prominent left posterior communicating artery is present.  There is mild attenuation of distal PCA branch vessels.  IMPRESSION:  1.  Mild distal small vessel disease. 2.  Question occlusion of a left M2 segment approximately 15 mm from the bifurcation.  MRA NECK  Findings: There is no significant to flow disturbance at either carotid bifurcation.  Flow is antegrade within the vertebral arteries bilaterally.  There is a common origin of the innominate artery and left common carotid artery.  The vertebral arteries both originate from the subclavian arteries. There is moderate tortuosity in the proximal vertebral arteries. The vessel caliber is maintained.  There is  no evidence for dissection or obstruction.  The right common carotid artery is within normal limits.  The carotid bifurcation is unremarkable.  There is mild tortuosity of the cervical right ICA without significant stenosis.  The left common carotid artery is within normal limits.  The bifurcation is unremarkable.  There is mild tortuosity of the proximal left ICA without significant stenosis.  IMPRESSION:  1.  No evidence for vertebral artery dissection or obstruction. 2.  Normal appearance of the carotid bifurcations bilaterally. 3.  Tortuosity of the internal carotid arteries bilaterally may be related to hypertension.  There is no associated stenosis.   Original Report Authenticated By: Marin Roberts, M.D.   Mr [facility] Wo Contrast  08/01/2012   *RADIOLOGY REPORT*  Clinical Data:  Abnormal CT of the head.  Cervical spine fractures. Fracture through the vertebral canal.  MRI HEAD WITHOUT AND WITH CONTRAST MRA HEAD WITHOUT CONTRAST MRA NECK WITHOUT AND WITH CONTRAST  Technique:  Multiplanar, multiecho pulse sequences of the brain and surrounding structures were obtained without and with intravenous contrast.  Angiographic images of the Circle of Willis were obtained using MRA technique without intravenous contrast. Angiographic images of the neck were obtained using MRA technique without and with intravenous contrast.  Carotid stenosis measurements (when applicable) are obtained utilizing NASCET criteria, using the distal internal carotid diameter as the denominator.  Contrast: 15mL MULTIHANCE GADOBENATE DIMEGLUMINE 529 MG/ML IV SOLN  Comparison:  CT head and cervical spine from the same day.  MRI HEAD  Findings:  No acute infarct, hemorrhage, or mass lesion is present. Moderate generalized atrophy diffuse white matter disease is evident.  The ventricles are proportionate to the degree of atrophy.  Flow is present in the major intracranial arteries.  The patient is status post bilateral lens extractions.   The globes and orbits are otherwise intact.  Mild mucosal thickening is present in the ethmoid air cells bilaterally.  The postcontrast images demonstrate no pathologic enhancement.  IMPRESSION:  1.  No acute intracranial abnormality. 2.  Moderate atrophy and generalized white matter disease.  This likely reflects the sequelae of chronic microvascular ischemia.  3.  Minimal sinus disease.  MRA HEAD  Findings: The internal carotid arteries are within normal limits from the high cervical segments through the ICA termini bilaterally.  There is early bifurcation of the left middle cerebral artery, a normal variant.  The A1 and M1 segments are otherwise within normal limits.  The anterior communicating artery is patent.  Mild distal branch vessel disease is evident bilaterally.  A left M2 segment is occluded approximately 15 mm from its origin.  The left vertebral artery is slightly dominant to the right. Prominent AICA vessels are noted bilaterally.  Both posterior cerebral arteries originate from basilar tip.  A prominent left posterior communicating artery is present.  There is mild attenuation of distal PCA branch vessels.  IMPRESSION:  1.  Mild distal small vessel disease. 2.  Question occlusion of a left M2 segment approximately 15 mm from the bifurcation.  MRA NECK  Findings: There is no significant to flow disturbance at either carotid bifurcation.  Flow is antegrade within the vertebral arteries bilaterally.  There is a common origin of the innominate artery and left common carotid artery.  The vertebral arteries both originate from the subclavian arteries. There is moderate tortuosity in the proximal vertebral arteries. The vessel caliber is maintained.  There is no evidence for dissection or obstruction.  The right common carotid artery is within normal limits.  The carotid bifurcation is unremarkable.  There is mild tortuosity of the cervical right ICA without significant stenosis.  The left common carotid  artery is within normal limits.  The bifurcation is unremarkable.  There is mild tortuosity of the proximal left ICA without significant stenosis.  IMPRESSION:  1.  No evidence for vertebral artery dissection or obstruction. 2.  Normal appearance of the carotid bifurcations bilaterally. 3.  Tortuosity of the internal carotid arteries bilaterally may be related to hypertension.  There is no associated stenosis.   Original Report Authenticated By: Marin Roberts, M.D.    Assessment/Plan Present on Admission:  . Diverticulitis of colon C6-7 fractures Left M2 segment occlusion.  PLAN:  Will admit her to general medical floor.  She will continue with the Aspen collar for her cervical fx with outpatient follow up with Dr Phoebe Perch of neurosurgery.  I am not sure about the clinical significance of her  M2 segment occlusion, since she presents with no neurological symptoms.  Since she has been on Fosamax, will discontinue it to promote proper bone healing.  For her diverticulitis, will treat with IV Cipro and Flagyl.  She is stable, full code, and will be admitted to [facility] service.  Thank you for allowing me to partake in the care of this nice patient.  Other plans as per orders.  Code Status: FULL Unk Lightning, MD. [facility] Pager [phone] 7pm to 7am.  08/01/2012, 9:44 PM

## 2012-08-01 NOTE — ED Notes (Signed)
MRI called, will be here to get pt soon, must be saline locked for procedure - will admin antibiotics when pt returns

## 2012-08-01 NOTE — ED Provider Notes (Signed)
Care assumed at sign out from Dr. Hyacinth Meeker. Patient had abdominal pain in PMD office and fell and hit her head. Sign out to f/u CT ab/pel and CT head/neck. CT head/neck showed C6/C7 fracture. I called Dr. Phoebe Perch, neurosurgeon, who said that dissection unlikely and that she needs to be on aspen collar and can f/u outpatient with him. He does recommend MRA to r/o dissection, which was negative. Her CT ab/pel showed diverticulitis with no abscess. I gave her cipro/flagyl. I discussed with family regarding PO abx vs admission. She lives with her husband and has minimal help at home. She wants to stay for IV abx. I talked to Dr. Conley Rolls, who accepted the patient.   CRITICAL CARE Performed by: Silverio Lay, DAVID   Total critical care time: 30 min   Critical care time was exclusive of separately billable procedures and treating other patients.  Critical care was necessary to treat or prevent imminent or life-threatening deterioration.  Critical care was time spent personally by me on the following activities: development of treatment plan with patient and/or surrogate as well as nursing, discussions with consultants, evaluation of patient's response to treatment, examination of patient, obtaining history from patient or surrogate, ordering and performing treatments and interventions, ordering and review of laboratory studies, ordering and review of radiographic studies, pulse oximetry and re-evaluation of patient's condition.  Results for orders placed during the hospital encounter of 08/01/12  CBC WITH DIFFERENTIAL      Result Value Range   WBC 14.2 (*) 4.0 - 10.5 K/uL   RBC 4.33  3.87 - 5.11 MIL/uL   Hemoglobin 13.4  12.0 - 15.0 g/dL   HCT 09.8  11.9 - 14.7 %   MCV 91.2  78.0 - 100.0 fL   MCH 30.9  26.0 - 34.0 pg   MCHC 33.9  30.0 - 36.0 g/dL   RDW 82.9  56.2 - 13.0 %   Platelets 297  150 - 400 K/uL   Neutrophils Relative % 86 (*) 43 - 77 %   Neutro Abs 12.2 (*) 1.7 - 7.7 K/uL   Lymphocytes Relative 6 (*) 12  - 46 %   Lymphs Abs 0.9  0.7 - 4.0 K/uL   Monocytes Relative 7  3 - 12 %   Monocytes Absolute 1.0  0.1 - 1.0 K/uL   Eosinophils Relative 1  0 - 5 %   Eosinophils Absolute 0.1  0.0 - 0.7 K/uL   Basophils Relative 0  0 - 1 %   Basophils Absolute 0.0  0.0 - 0.1 K/uL  COMPREHENSIVE METABOLIC PANEL      Result Value Range   Sodium 136  135 - 145 mEq/L   Potassium 3.7  3.5 - 5.1 mEq/L   Chloride 98  96 - 112 mEq/L   CO2 28  19 - 32 mEq/L   Glucose, Bld 102 (*) 70 - 99 mg/dL   BUN 18  6 - 23 mg/dL   Creatinine, Ser 8.65  0.50 - 1.10 mg/dL   Calcium 9.7  8.4 - 78.4 mg/dL   Total Protein 7.8  6.0 - 8.3 g/dL   Albumin 3.8  3.5 - 5.2 g/dL   AST 33  0 - 37 U/L   ALT 29  0 - 35 U/L   Alkaline Phosphatase 76  39 - 117 U/L   Total Bilirubin 0.6  0.3 - 1.2 mg/dL   GFR calc non Af Amer 56 (*) >90 mL/min   GFR calc Af Amer 65 (*) >90 mL/min  LIPASE, BLOOD      Result Value Range   Lipase 18  11 - 59 U/L  URINALYSIS, ROUTINE W REFLEX MICROSCOPIC      Result Value Range   Color, Urine AMBER (*) YELLOW   APPearance CLOUDY (*) CLEAR   Specific Gravity, Urine 1.031 (*) 1.005 - 1.030   pH 6.0  5.0 - 8.0   Glucose, UA NEGATIVE  NEGATIVE mg/dL   Hgb urine dipstick SMALL (*) NEGATIVE   Bilirubin Urine SMALL (*) NEGATIVE   Ketones, ur 40 (*) NEGATIVE mg/dL   Protein, ur 30 (*) NEGATIVE mg/dL   Urobilinogen, UA 1.0  0.0 - 1.0 mg/dL   Nitrite NEGATIVE  NEGATIVE   Leukocytes, UA TRACE (*) NEGATIVE  URINE MICROSCOPIC-ADD ON      Result Value Range   Squamous Epithelial / LPF RARE  RARE   WBC, UA 3-6  <3 WBC/hpf   RBC / HPF 3-6  <3 RBC/hpf   Bacteria, UA FEW (*) RARE   Casts GRANULAR CAST (*) NEGATIVE   Ct Abdomen Pelvis Wo Contrast  08/01/2012   *RADIOLOGY REPORT*  Clinical Data: Abdominal pain.  CT ABDOMEN AND PELVIS WITHOUT CONTRAST  Technique:  Multidetector CT imaging of the abdomen and pelvis was performed following the standard protocol without intravenous contrast.  Comparison: 04/23/2008   Findings: Inflammatory process in the left lower quadrant of the peritoneal cavity present with thickening of a loop of sigmoid colon and also an adjacent small bowel loop.  Diffuse diverticula are present emanating from the sigmoid colon and this most likely is representative of acute diverticulitis with adjacent inflammation of small bowel.  No extraluminal air or focal abscess is identified.  Adjacent inflammatory changes are present in the mesenteric fat.  Other bowel loops are unremarkable.  Unenhanced appearance of the solid organs is stable including a left-sided renal cyst.  No masses or enlarged lymph nodes are seen.  No hernias are identified.  Bony structures show some progression of spondylosis of the lumbar spine with significant disc space narrowing present at all levels and an associated grade 1 anterolisthesis of L5 on S1.  IMPRESSION: Findings most likely consistent with acute sigmoid diverticulitis with associated adjacent inflammation of a small bowel loop.  No focal abscess is identified.   Original Report Authenticated By: Irish Lack, M.D.   Ct Head Wo Contrast  08/01/2012   *RADIOLOGY REPORT*  Clinical Data:  Headache and trauma.  Complains of neck pain. Forehead hematoma.  CT HEAD WITHOUT CONTRAST CT CERVICAL SPINE WITHOUT CONTRAST  Technique:  Multidetector CT imaging of the head and cervical spine was performed following the standard protocol without intravenous contrast.  Multiplanar CT image reconstructions of the cervical spine were also generated.  Comparison:   None  CT HEAD  Findings: There is high-density material along the posterior falx, particularly on the left side.  Findings raise concern for a small subdural hematoma.  The patient has diffuse atrophy and there is diffuse low density in the periventricular and subcortical white matter suggesting chronic changes.  There is no significant mass effect, midline shift or hydrocephalus.  Paranasal sinuses and mastoid air cells  are clear.  No evidence for a calvarial fracture.  IMPRESSION: Concern for a very small subdural hematoma along the posterior falx.  Diffuse low density in the white matter suggests chronic small vessel ischemic changes.  CT CERVICAL SPINE  Findings:    There is a minimally-displaced fracture involving the C6 left pedicle with extension into the  transverse foramen.  There is also a fracture of the C6 left lamina. Displaced fracture of the left C7 superior facet.  Mild soft tissue swelling adjacent to these fractures.  Mild heterogeneity of the right thyroid lobe.  No evidence for an apical pneumothorax.  There is marked degenerative disc disease at C5-C6.  Mild anterolisthesis at C4-C5 is likely degenerative in etiology. Alignment of the cervicothoracic junction is within normal limits.  IMPRESSION: Fractures of the C6 left pedicle and C6 left lamina.  Fracture extends into the left C6 transverse foramen.  There is also a fracture of the C7 left superior facet.  A CTA or MRA of the neck would be appropriate to exclude an injury or dissection to the left vertebral artery.  Critical Value/emergent results were called by telephone at the time of interpretation on 08/01/2012 at 4:53 p.m. to Dr. Thurnell Lose, who verbally acknowledged these results.   Original Report Authenticated By: Richarda Overlie, M.D.   Ct Cervical Spine Wo Contrast  08/01/2012   *RADIOLOGY REPORT*  Clinical Data:  Headache and trauma.  Complains of neck pain. Forehead hematoma.  CT HEAD WITHOUT CONTRAST CT CERVICAL SPINE WITHOUT CONTRAST  Technique:  Multidetector CT imaging of the head and cervical spine was performed following the standard protocol without intravenous contrast.  Multiplanar CT image reconstructions of the cervical spine were also generated.  Comparison:   None  CT HEAD  Findings: There is high-density material along the posterior falx, particularly on the left side.  Findings raise concern for a small subdural hematoma.  The patient has  diffuse atrophy and there is diffuse low density in the periventricular and subcortical white matter suggesting chronic changes.  There is no significant mass effect, midline shift or hydrocephalus.  Paranasal sinuses and mastoid air cells are clear.  No evidence for a calvarial fracture.  IMPRESSION: Concern for a very small subdural hematoma along the posterior falx.  Diffuse low density in the white matter suggests chronic small vessel ischemic changes.  CT CERVICAL SPINE  Findings:    There is a minimally-displaced fracture involving the C6 left pedicle with extension into the transverse foramen.  There is also a fracture of the C6 left lamina. Displaced fracture of the left C7 superior facet.  Mild soft tissue swelling adjacent to these fractures.  Mild heterogeneity of the right thyroid lobe.  No evidence for an apical pneumothorax.  There is marked degenerative disc disease at C5-C6.  Mild anterolisthesis at C4-C5 is likely degenerative in etiology. Alignment of the cervicothoracic junction is within normal limits.  IMPRESSION: Fractures of the C6 left pedicle and C6 left lamina.  Fracture extends into the left C6 transverse foramen.  There is also a fracture of the C7 left superior facet.  A CTA or MRA of the neck would be appropriate to exclude an injury or dissection to the left vertebral artery.  Critical Value/emergent results were called by telephone at the time of interpretation on 08/01/2012 at 4:53 p.m. to Dr. Thurnell Lose, who verbally acknowledged these results.   Original Report Authenticated By: Richarda Overlie, M.D.   Mr [facility] Wo Contrast  08/01/2012   *RADIOLOGY REPORT*  Clinical Data:  Abnormal CT of the head.  Cervical spine fractures. Fracture through the vertebral canal.  MRI HEAD WITHOUT AND WITH CONTRAST MRA HEAD WITHOUT CONTRAST MRA NECK WITHOUT AND WITH CONTRAST  Technique:  Multiplanar, multiecho pulse sequences of the brain and surrounding structures were obtained without and with intravenous  contrast.  Angiographic images  of the Circle of Willis were obtained using MRA technique without intravenous contrast. Angiographic images of the neck were obtained using MRA technique without and with intravenous contrast.  Carotid stenosis measurements (when applicable) are obtained utilizing NASCET criteria, using the distal internal carotid diameter as the denominator.  Contrast: 15mL MULTIHANCE GADOBENATE DIMEGLUMINE 529 MG/ML IV SOLN  Comparison:  CT head and cervical spine from the same day.  MRI HEAD  Findings:  No acute infarct, hemorrhage, or mass lesion is present. Moderate generalized atrophy diffuse white matter disease is evident.  The ventricles are proportionate to the degree of atrophy.  Flow is present in the major intracranial arteries.  The patient is status post bilateral lens extractions.  The globes and orbits are otherwise intact.  Mild mucosal thickening is present in the ethmoid air cells bilaterally.  The postcontrast images demonstrate no pathologic enhancement.  IMPRESSION:  1.  No acute intracranial abnormality. 2.  Moderate atrophy and generalized white matter disease.  This likely reflects the sequelae of chronic microvascular ischemia. 3.  Minimal sinus disease.  MRA HEAD  Findings: The internal carotid arteries are within normal limits from the high cervical segments through the ICA termini bilaterally.  There is early bifurcation of the left middle cerebral artery, a normal variant.  The A1 and M1 segments are otherwise within normal limits.  The anterior communicating artery is patent.  Mild distal branch vessel disease is evident bilaterally.  A left M2 segment is occluded approximately 15 mm from its origin.  The left vertebral artery is slightly dominant to the right. Prominent AICA vessels are noted bilaterally.  Both posterior cerebral arteries originate from basilar tip.  A prominent left posterior communicating artery is present.  There is mild attenuation of distal PCA  branch vessels.  IMPRESSION:  1.  Mild distal small vessel disease. 2.  Question occlusion of a left M2 segment approximately 15 mm from the bifurcation.  MRA NECK  Findings: There is no significant to flow disturbance at either carotid bifurcation.  Flow is antegrade within the vertebral arteries bilaterally.  There is a common origin of the innominate artery and left common carotid artery.  The vertebral arteries both originate from the subclavian arteries. There is moderate tortuosity in the proximal vertebral arteries. The vessel caliber is maintained.  There is no evidence for dissection or obstruction.  The right common carotid artery is within normal limits.  The carotid bifurcation is unremarkable.  There is mild tortuosity of the cervical right ICA without significant stenosis.  The left common carotid artery is within normal limits.  The bifurcation is unremarkable.  There is mild tortuosity of the proximal left ICA without significant stenosis.  IMPRESSION:  1.  No evidence for vertebral artery dissection or obstruction. 2.  Normal appearance of the carotid bifurcations bilaterally. 3.  Tortuosity of the internal carotid arteries bilaterally may be related to hypertension.  There is no associated stenosis.   Original Report Authenticated By: Marin Roberts, M.D.   Mr Angiogram Neck W Wo Contrast  08/01/2012   *RADIOLOGY REPORT*  Clinical Data:  Abnormal CT of the head.  Cervical spine fractures. Fracture through the vertebral canal.  MRI HEAD WITHOUT AND WITH CONTRAST MRA HEAD WITHOUT CONTRAST MRA NECK WITHOUT AND WITH CONTRAST  Technique:  Multiplanar, multiecho pulse sequences of the brain and surrounding structures were obtained without and with intravenous contrast.  Angiographic images of the Circle of Willis were obtained using MRA technique without intravenous contrast. Angiographic images of  the neck were obtained using MRA technique without and with intravenous contrast.  Carotid stenosis  measurements (when applicable) are obtained utilizing NASCET criteria, using the distal internal carotid diameter as the denominator.  Contrast: 15mL MULTIHANCE GADOBENATE DIMEGLUMINE 529 MG/ML IV SOLN  Comparison:  CT head and cervical spine from the same day.  MRI HEAD  Findings:  No acute infarct, hemorrhage, or mass lesion is present. Moderate generalized atrophy diffuse white matter disease is evident.  The ventricles are proportionate to the degree of atrophy.  Flow is present in the major intracranial arteries.  The patient is status post bilateral lens extractions.  The globes and orbits are otherwise intact.  Mild mucosal thickening is present in the ethmoid air cells bilaterally.  The postcontrast images demonstrate no pathologic enhancement.  IMPRESSION:  1.  No acute intracranial abnormality. 2.  Moderate atrophy and generalized white matter disease.  This likely reflects the sequelae of chronic microvascular ischemia. 3.  Minimal sinus disease.  MRA HEAD  Findings: The internal carotid arteries are within normal limits from the high cervical segments through the ICA termini bilaterally.  There is early bifurcation of the left middle cerebral artery, a normal variant.  The A1 and M1 segments are otherwise within normal limits.  The anterior communicating artery is patent.  Mild distal branch vessel disease is evident bilaterally.  A left M2 segment is occluded approximately 15 mm from its origin.  The left vertebral artery is slightly dominant to the right. Prominent AICA vessels are noted bilaterally.  Both posterior cerebral arteries originate from basilar tip.  A prominent left posterior communicating artery is present.  There is mild attenuation of distal PCA branch vessels.  IMPRESSION:  1.  Mild distal small vessel disease. 2.  Question occlusion of a left M2 segment approximately 15 mm from the bifurcation.  MRA NECK  Findings: There is no significant to flow disturbance at either carotid  bifurcation.  Flow is antegrade within the vertebral arteries bilaterally.  There is a common origin of the innominate artery and left common carotid artery.  The vertebral arteries both originate from the subclavian arteries. There is moderate tortuosity in the proximal vertebral arteries. The vessel caliber is maintained.  There is no evidence for dissection or obstruction.  The right common carotid artery is within normal limits.  The carotid bifurcation is unremarkable.  There is mild tortuosity of the cervical right ICA without significant stenosis.  The left common carotid artery is within normal limits.  The bifurcation is unremarkable.  There is mild tortuosity of the proximal left ICA without significant stenosis.  IMPRESSION:  1.  No evidence for vertebral artery dissection or obstruction. 2.  Normal appearance of the carotid bifurcations bilaterally. 3.  Tortuosity of the internal carotid arteries bilaterally may be related to hypertension.  There is no associated stenosis.   Original Report Authenticated By: Marin Roberts, M.D.   Mr [facility] Wo Contrast  08/01/2012   *RADIOLOGY REPORT*  Clinical Data:  Abnormal CT of the head.  Cervical spine fractures. Fracture through the vertebral canal.  MRI HEAD WITHOUT AND WITH CONTRAST MRA HEAD WITHOUT CONTRAST MRA NECK WITHOUT AND WITH CONTRAST  Technique:  Multiplanar, multiecho pulse sequences of the brain and surrounding structures were obtained without and with intravenous contrast.  Angiographic images of the Circle of Willis were obtained using MRA technique without intravenous contrast. Angiographic images of the neck were obtained using MRA technique without and with intravenous contrast.  Carotid stenosis measurements (  when applicable) are obtained utilizing NASCET criteria, using the distal internal carotid diameter as the denominator.  Contrast: 15mL MULTIHANCE GADOBENATE DIMEGLUMINE 529 MG/ML IV SOLN  Comparison:  CT head and cervical spine  from the same day.  MRI HEAD  Findings:  No acute infarct, hemorrhage, or mass lesion is present. Moderate generalized atrophy diffuse white matter disease is evident.  The ventricles are proportionate to the degree of atrophy.  Flow is present in the major intracranial arteries.  The patient is status post bilateral lens extractions.  The globes and orbits are otherwise intact.  Mild mucosal thickening is present in the ethmoid air cells bilaterally.  The postcontrast images demonstrate no pathologic enhancement.  IMPRESSION:  1.  No acute intracranial abnormality. 2.  Moderate atrophy and generalized white matter disease.  This likely reflects the sequelae of chronic microvascular ischemia. 3.  Minimal sinus disease.  MRA HEAD  Findings: The internal carotid arteries are within normal limits from the high cervical segments through the ICA termini bilaterally.  There is early bifurcation of the left middle cerebral artery, a normal variant.  The A1 and M1 segments are otherwise within normal limits.  The anterior communicating artery is patent.  Mild distal branch vessel disease is evident bilaterally.  A left M2 segment is occluded approximately 15 mm from its origin.  The left vertebral artery is slightly dominant to the right. Prominent AICA vessels are noted bilaterally.  Both posterior cerebral arteries originate from basilar tip.  A prominent left posterior communicating artery is present.  There is mild attenuation of distal PCA branch vessels.  IMPRESSION:  1.  Mild distal small vessel disease. 2.  Question occlusion of a left M2 segment approximately 15 mm from the bifurcation.  MRA NECK  Findings: There is no significant to flow disturbance at either carotid bifurcation.  Flow is antegrade within the vertebral arteries bilaterally.  There is a common origin of the innominate artery and left common carotid artery.  The vertebral arteries both originate from the subclavian arteries. There is moderate  tortuosity in the proximal vertebral arteries. The vessel caliber is maintained.  There is no evidence for dissection or obstruction.  The right common carotid artery is within normal limits.  The carotid bifurcation is unremarkable.  There is mild tortuosity of the cervical right ICA without significant stenosis.  The left common carotid artery is within normal limits.  The bifurcation is unremarkable.  There is mild tortuosity of the proximal left ICA without significant stenosis.  IMPRESSION:  1.  No evidence for vertebral artery dissection or obstruction. 2.  Normal appearance of the carotid bifurcations bilaterally. 3.  Tortuosity of the internal carotid arteries bilaterally may be related to hypertension.  There is no associated stenosis.   Original Report Authenticated By: Marin Roberts, M.D.       Richardean Canal, MD 08/01/12 2055

## 2012-08-01 NOTE — Progress Notes (Signed)
   CARE MANAGEMENT ED NOTE 08/01/2012  Patient:  KEYRI, SALBERG A   Account Number:  [id number]  Date Initiated:  08/01/2012  Documentation initiated by:  Radford Pax  Subjective/Objective Assessment:   Patient came into ED after having a fall.     Subjective/Objective Assessment Detail:     Action/Plan:   Action/Plan Detail:   Anticipated DC Date:       Status Recommendation to Physician:   Result of Recommendation:    Other ED Services  Consult Working Plan    DC Planning Services  Other  PCP issues    Choice offered to / List presented to:            Status of service:    ED Comments:   ED Comments Detail:  Patient listed in computer sysytem as not having a PCP. EDCM spoke to patient who stated she sees Dr. Selena Batten of [facility] physicians.  Offered patient encouragement and support.  No further needs at this time.

## 2012-08-02 ENCOUNTER — Inpatient Hospital Stay (HOSPITAL_COMMUNITY): Payer: Medicare Other

## 2012-08-02 DIAGNOSIS — K5732 Diverticulitis of large intestine without perforation or abscess without bleeding: Secondary | ICD-10-CM

## 2012-08-02 DIAGNOSIS — S129XXA Fracture of neck, unspecified, initial encounter: Secondary | ICD-10-CM

## 2012-08-02 LAB — URINE CULTURE
Colony Count: NO GROWTH
Culture: NO GROWTH

## 2012-08-02 NOTE — Progress Notes (Signed)
[facility] PROGRESS NOTE Interim History: 78 y.o. female with hx of bowel obtruction s/p abdominal surgery, osteopenia on Fosamax, presents to her PCP with abdominal pain and nausea, vomiting. In the office, while obtaining a UA, she fell hard and hit her head. There was no loss of consciousness. Evaluation in the ER included a head CT, cervical CT, abdominal and pelvic CT along with lab work. She was found to have a C6 and C7 fractures, along with evidence of diverticulitis of the sigmoid colon. Neurosurgical consultation by EDP with Dr Phoebe Perch, who recommended a MRA of the neck to exclude vetebral artery dissection, which was negative. He further recommended an Aspen's collar to be placed and worn at all time until he sees her outpatient for follow up.    Assessment/Plan: Diverticulitis of colon - I agree with Cipro and flagyl 5.30.2014. - She is neurologically intact, M2 occlusion will have significant neurological deficit. - cont clear liq diet she is mildly nauseated. - xray fracture showed no dislocation or fracture.  C6 cervical fracture/ C7 cervical fracture: - Dr. Blanche East recommended an Aspen's collar to be placed and worn at all time until he sees her outpatient for follow up.    Code Status: full Family Communication: none  Disposition Plan: inpatient   Consultants:  none  Procedures:  Ct abdomen 5.30.2014  Antibiotics:  cipro and flagyl 5.30.2014  HPI/Subjective: Mild nausea, not hungry. No abdominal pain No shoulder pain.  Objective: Filed Vitals:   08/01/12 1330 08/01/12 2009 08/01/12 2300 08/02/12 0616  BP: 158/70 160/85 152/70 140/65  Pulse: 79 89 89 85  Temp: 98.4 F (36.9 C)  98.3 F (36.8 C) 98.9 F (37.2 C)  TempSrc: Oral  Oral Oral  Resp: 18 16 18 18   Height:  5\' 5"  (1.651 m)    Weight:  70.308 kg (155 lb)    SpO2: 95% 96% 95% 97%    Intake/Output Summary (Last 24 hours) at 08/02/12 1019 Last data filed at 08/02/12 0617  Gross per 24  hour  Intake      0 ml  Output    650 ml  Net   -650 ml   Filed Weights   08/01/12 2009  Weight: 70.308 kg (155 lb)    Exam:  General: Alert, awake, oriented x3, in no acute distress.  HEENT: No bruits, no goiter.  Heart: Regular rate and rhythm, without murmurs, rubs, gallops.  Lungs: Good air movement, bilateral air movement.  Abdomen: Soft, nontender, nondistended, positive bowel sounds.  Neuro: Grossly intact, nonfocal.   Data Reviewed: Basic Metabolic Panel:  Recent Labs Lab 08/01/12 1410  NA 136  K 3.7  CL 98  CO2 28  GLUCOSE 102*  BUN 18  CREATININE 0.97  CALCIUM 9.7   Liver Function Tests:  Recent Labs Lab 08/01/12 1410  AST 33  ALT 29  ALKPHOS 76  BILITOT 0.6  PROT 7.8  ALBUMIN 3.8    Recent Labs Lab 08/01/12 1410  LIPASE 18   No results found for this basename: AMMONIA,  in the last 168 hours CBC:  Recent Labs Lab 08/01/12 1410  WBC 14.2*  NEUTROABS 12.2*  HGB 13.4  HCT 39.5  MCV 91.2  PLT 297   Cardiac Enzymes: No results found for this basename: CKTOTAL, CKMB, CKMBINDEX, TROPONINI,  in the last 168 hours BNP (last 3 results) No results found for this basename: PROBNP,  in the last 8760 hours CBG: No results found for this basename: GLUCAP,  in the  last 168 hours  No results found for this or any previous visit (from the past 240 hour(s)).   Studies: Ct Abdomen Pelvis Wo Contrast  08/01/2012   *RADIOLOGY REPORT*  Clinical Data: Abdominal pain.  CT ABDOMEN AND PELVIS WITHOUT CONTRAST  Technique:  Multidetector CT imaging of the abdomen and pelvis was performed following the standard protocol without intravenous contrast.  Comparison: 04/23/2008  Findings: Inflammatory process in the left lower quadrant of the peritoneal cavity present with thickening of a loop of sigmoid colon and also an adjacent small bowel loop.  Diffuse diverticula are present emanating from the sigmoid colon and this most likely is representative of acute  diverticulitis with adjacent inflammation of small bowel.  No extraluminal air or focal abscess is identified.  Adjacent inflammatory changes are present in the mesenteric fat.  Other bowel loops are unremarkable.  Unenhanced appearance of the solid organs is stable including a left-sided renal cyst.  No masses or enlarged lymph nodes are seen.  No hernias are identified.  Bony structures show some progression of spondylosis of the lumbar spine with significant disc space narrowing present at all levels and an associated grade 1 anterolisthesis of L5 on S1.  IMPRESSION: Findings most likely consistent with acute sigmoid diverticulitis with associated adjacent inflammation of a small bowel loop.  No focal abscess is identified.   Original Report Authenticated By: Irish Lack, M.D.   Ct Head Wo Contrast  08/01/2012   *RADIOLOGY REPORT*  Clinical Data:  Headache and trauma.  Complains of neck pain. Forehead hematoma.  CT HEAD WITHOUT CONTRAST CT CERVICAL SPINE WITHOUT CONTRAST  Technique:  Multidetector CT imaging of the head and cervical spine was performed following the standard protocol without intravenous contrast.  Multiplanar CT image reconstructions of the cervical spine were also generated.  Comparison:   None  CT HEAD  Findings: There is high-density material along the posterior falx, particularly on the left side.  Findings raise concern for a small subdural hematoma.  The patient has diffuse atrophy and there is diffuse low density in the periventricular and subcortical white matter suggesting chronic changes.  There is no significant mass effect, midline shift or hydrocephalus.  Paranasal sinuses and mastoid air cells are clear.  No evidence for a calvarial fracture.  IMPRESSION: Concern for a very small subdural hematoma along the posterior falx.  Diffuse low density in the white matter suggests chronic small vessel ischemic changes.  CT CERVICAL SPINE  Findings:    There is a minimally-displaced  fracture involving the C6 left pedicle with extension into the transverse foramen.  There is also a fracture of the C6 left lamina. Displaced fracture of the left C7 superior facet.  Mild soft tissue swelling adjacent to these fractures.  Mild heterogeneity of the right thyroid lobe.  No evidence for an apical pneumothorax.  There is marked degenerative disc disease at C5-C6.  Mild anterolisthesis at C4-C5 is likely degenerative in etiology. Alignment of the cervicothoracic junction is within normal limits.  IMPRESSION: Fractures of the C6 left pedicle and C6 left lamina.  Fracture extends into the left C6 transverse foramen.  There is also a fracture of the C7 left superior facet.  A CTA or MRA of the neck would be appropriate to exclude an injury or dissection to the left vertebral artery.  Critical Value/emergent results were called by telephone at the time of interpretation on 08/01/2012 at 4:53 p.m. to Dr. Thurnell Lose, who verbally acknowledged these results.   Original Report Authenticated  By: Richarda Overlie, M.D.   Ct Cervical Spine Wo Contrast  08/01/2012   *RADIOLOGY REPORT*  Clinical Data:  Headache and trauma.  Complains of neck pain. Forehead hematoma.  CT HEAD WITHOUT CONTRAST CT CERVICAL SPINE WITHOUT CONTRAST  Technique:  Multidetector CT imaging of the head and cervical spine was performed following the standard protocol without intravenous contrast.  Multiplanar CT image reconstructions of the cervical spine were also generated.  Comparison:   None  CT HEAD  Findings: There is high-density material along the posterior falx, particularly on the left side.  Findings raise concern for a small subdural hematoma.  The patient has diffuse atrophy and there is diffuse low density in the periventricular and subcortical white matter suggesting chronic changes.  There is no significant mass effect, midline shift or hydrocephalus.  Paranasal sinuses and mastoid air cells are clear.  No evidence for a calvarial fracture.   IMPRESSION: Concern for a very small subdural hematoma along the posterior falx.  Diffuse low density in the white matter suggests chronic small vessel ischemic changes.  CT CERVICAL SPINE  Findings:    There is a minimally-displaced fracture involving the C6 left pedicle with extension into the transverse foramen.  There is also a fracture of the C6 left lamina. Displaced fracture of the left C7 superior facet.  Mild soft tissue swelling adjacent to these fractures.  Mild heterogeneity of the right thyroid lobe.  No evidence for an apical pneumothorax.  There is marked degenerative disc disease at C5-C6.  Mild anterolisthesis at C4-C5 is likely degenerative in etiology. Alignment of the cervicothoracic junction is within normal limits.  IMPRESSION: Fractures of the C6 left pedicle and C6 left lamina.  Fracture extends into the left C6 transverse foramen.  There is also a fracture of the C7 left superior facet.  A CTA or MRA of the neck would be appropriate to exclude an injury or dissection to the left vertebral artery.  Critical Value/emergent results were called by telephone at the time of interpretation on 08/01/2012 at 4:53 p.m. to Dr. Thurnell Lose, who verbally acknowledged these results.   Original Report Authenticated By: Richarda Overlie, M.D.   Mr [facility] Wo Contrast  08/01/2012   *RADIOLOGY REPORT*  Clinical Data:  Abnormal CT of the head.  Cervical spine fractures. Fracture through the vertebral canal.  MRI HEAD WITHOUT AND WITH CONTRAST MRA HEAD WITHOUT CONTRAST MRA NECK WITHOUT AND WITH CONTRAST  Technique:  Multiplanar, multiecho pulse sequences of the brain and surrounding structures were obtained without and with intravenous contrast.  Angiographic images of the Circle of Willis were obtained using MRA technique without intravenous contrast. Angiographic images of the neck were obtained using MRA technique without and with intravenous contrast.  Carotid stenosis measurements (when applicable) are obtained  utilizing NASCET criteria, using the distal internal carotid diameter as the denominator.  Contrast: 15mL MULTIHANCE GADOBENATE DIMEGLUMINE 529 MG/ML IV SOLN  Comparison:  CT head and cervical spine from the same day.  MRI HEAD  Findings:  No acute infarct, hemorrhage, or mass lesion is present. Moderate generalized atrophy diffuse white matter disease is evident.  The ventricles are proportionate to the degree of atrophy.  Flow is present in the major intracranial arteries.  The patient is status post bilateral lens extractions.  The globes and orbits are otherwise intact.  Mild mucosal thickening is present in the ethmoid air cells bilaterally.  The postcontrast images demonstrate no pathologic enhancement.  IMPRESSION:  1.  No acute intracranial abnormality.  2.  Moderate atrophy and generalized white matter disease.  This likely reflects the sequelae of chronic microvascular ischemia. 3.  Minimal sinus disease.  MRA HEAD  Findings: The internal carotid arteries are within normal limits from the high cervical segments through the ICA termini bilaterally.  There is early bifurcation of the left middle cerebral artery, a normal variant.  The A1 and M1 segments are otherwise within normal limits.  The anterior communicating artery is patent.  Mild distal branch vessel disease is evident bilaterally.  A left M2 segment is occluded approximately 15 mm from its origin.  The left vertebral artery is slightly dominant to the right. Prominent AICA vessels are noted bilaterally.  Both posterior cerebral arteries originate from basilar tip.  A prominent left posterior communicating artery is present.  There is mild attenuation of distal PCA branch vessels.  IMPRESSION:  1.  Mild distal small vessel disease. 2.  Question occlusion of a left M2 segment approximately 15 mm from the bifurcation.  MRA NECK  Findings: There is no significant to flow disturbance at either carotid bifurcation.  Flow is antegrade within the vertebral  arteries bilaterally.  There is a common origin of the innominate artery and left common carotid artery.  The vertebral arteries both originate from the subclavian arteries. There is moderate tortuosity in the proximal vertebral arteries. The vessel caliber is maintained.  There is no evidence for dissection or obstruction.  The right common carotid artery is within normal limits.  The carotid bifurcation is unremarkable.  There is mild tortuosity of the cervical right ICA without significant stenosis.  The left common carotid artery is within normal limits.  The bifurcation is unremarkable.  There is mild tortuosity of the proximal left ICA without significant stenosis.  IMPRESSION:  1.  No evidence for vertebral artery dissection or obstruction. 2.  Normal appearance of the carotid bifurcations bilaterally. 3.  Tortuosity of the internal carotid arteries bilaterally may be related to hypertension.  There is no associated stenosis.   Original Report Authenticated By: Marin Roberts, M.D.   Mr Angiogram Neck W Wo Contrast  08/01/2012   *RADIOLOGY REPORT*  Clinical Data:  Abnormal CT of the head.  Cervical spine fractures. Fracture through the vertebral canal.  MRI HEAD WITHOUT AND WITH CONTRAST MRA HEAD WITHOUT CONTRAST MRA NECK WITHOUT AND WITH CONTRAST  Technique:  Multiplanar, multiecho pulse sequences of the brain and surrounding structures were obtained without and with intravenous contrast.  Angiographic images of the Circle of Willis were obtained using MRA technique without intravenous contrast. Angiographic images of the neck were obtained using MRA technique without and with intravenous contrast.  Carotid stenosis measurements (when applicable) are obtained utilizing NASCET criteria, using the distal internal carotid diameter as the denominator.  Contrast: 15mL MULTIHANCE GADOBENATE DIMEGLUMINE 529 MG/ML IV SOLN  Comparison:  CT head and cervical spine from the same day.  MRI HEAD  Findings:  No  acute infarct, hemorrhage, or mass lesion is present. Moderate generalized atrophy diffuse white matter disease is evident.  The ventricles are proportionate to the degree of atrophy.  Flow is present in the major intracranial arteries.  The patient is status post bilateral lens extractions.  The globes and orbits are otherwise intact.  Mild mucosal thickening is present in the ethmoid air cells bilaterally.  The postcontrast images demonstrate no pathologic enhancement.  IMPRESSION:  1.  No acute intracranial abnormality. 2.  Moderate atrophy and generalized white matter disease.  This likely reflects the sequelae  of chronic microvascular ischemia. 3.  Minimal sinus disease.  MRA HEAD  Findings: The internal carotid arteries are within normal limits from the high cervical segments through the ICA termini bilaterally.  There is early bifurcation of the left middle cerebral artery, a normal variant.  The A1 and M1 segments are otherwise within normal limits.  The anterior communicating artery is patent.  Mild distal branch vessel disease is evident bilaterally.  A left M2 segment is occluded approximately 15 mm from its origin.  The left vertebral artery is slightly dominant to the right. Prominent AICA vessels are noted bilaterally.  Both posterior cerebral arteries originate from basilar tip.  A prominent left posterior communicating artery is present.  There is mild attenuation of distal PCA branch vessels.  IMPRESSION:  1.  Mild distal small vessel disease. 2.  Question occlusion of a left M2 segment approximately 15 mm from the bifurcation.  MRA NECK  Findings: There is no significant to flow disturbance at either carotid bifurcation.  Flow is antegrade within the vertebral arteries bilaterally.  There is a common origin of the innominate artery and left common carotid artery.  The vertebral arteries both originate from the subclavian arteries. There is moderate tortuosity in the proximal vertebral arteries. The  vessel caliber is maintained.  There is no evidence for dissection or obstruction.  The right common carotid artery is within normal limits.  The carotid bifurcation is unremarkable.  There is mild tortuosity of the cervical right ICA without significant stenosis.  The left common carotid artery is within normal limits.  The bifurcation is unremarkable.  There is mild tortuosity of the proximal left ICA without significant stenosis.  IMPRESSION:  1.  No evidence for vertebral artery dissection or obstruction. 2.  Normal appearance of the carotid bifurcations bilaterally. 3.  Tortuosity of the internal carotid arteries bilaterally may be related to hypertension.  There is no associated stenosis.   Original Report Authenticated By: Marin Roberts, M.D.   Mr [facility] Wo Contrast  08/01/2012   *RADIOLOGY REPORT*  Clinical Data:  Abnormal CT of the head.  Cervical spine fractures. Fracture through the vertebral canal.  MRI HEAD WITHOUT AND WITH CONTRAST MRA HEAD WITHOUT CONTRAST MRA NECK WITHOUT AND WITH CONTRAST  Technique:  Multiplanar, multiecho pulse sequences of the brain and surrounding structures were obtained without and with intravenous contrast.  Angiographic images of the Circle of Willis were obtained using MRA technique without intravenous contrast. Angiographic images of the neck were obtained using MRA technique without and with intravenous contrast.  Carotid stenosis measurements (when applicable) are obtained utilizing NASCET criteria, using the distal internal carotid diameter as the denominator.  Contrast: 15mL MULTIHANCE GADOBENATE DIMEGLUMINE 529 MG/ML IV SOLN  Comparison:  CT head and cervical spine from the same day.  MRI HEAD  Findings:  No acute infarct, hemorrhage, or mass lesion is present. Moderate generalized atrophy diffuse white matter disease is evident.  The ventricles are proportionate to the degree of atrophy.  Flow is present in the major intracranial arteries.  The patient is  status post bilateral lens extractions.  The globes and orbits are otherwise intact.  Mild mucosal thickening is present in the ethmoid air cells bilaterally.  The postcontrast images demonstrate no pathologic enhancement.  IMPRESSION:  1.  No acute intracranial abnormality. 2.  Moderate atrophy and generalized white matter disease.  This likely reflects the sequelae of chronic microvascular ischemia. 3.  Minimal sinus disease.  MRA HEAD  Findings: The internal  carotid arteries are within normal limits from the high cervical segments through the ICA termini bilaterally.  There is early bifurcation of the left middle cerebral artery, a normal variant.  The A1 and M1 segments are otherwise within normal limits.  The anterior communicating artery is patent.  Mild distal branch vessel disease is evident bilaterally.  A left M2 segment is occluded approximately 15 mm from its origin.  The left vertebral artery is slightly dominant to the right. Prominent AICA vessels are noted bilaterally.  Both posterior cerebral arteries originate from basilar tip.  A prominent left posterior communicating artery is present.  There is mild attenuation of distal PCA branch vessels.  IMPRESSION:  1.  Mild distal small vessel disease. 2.  Question occlusion of a left M2 segment approximately 15 mm from the bifurcation.  MRA NECK  Findings: There is no significant to flow disturbance at either carotid bifurcation.  Flow is antegrade within the vertebral arteries bilaterally.  There is a common origin of the innominate artery and left common carotid artery.  The vertebral arteries both originate from the subclavian arteries. There is moderate tortuosity in the proximal vertebral arteries. The vessel caliber is maintained.  There is no evidence for dissection or obstruction.  The right common carotid artery is within normal limits.  The carotid bifurcation is unremarkable.  There is mild tortuosity of the cervical right ICA without  significant stenosis.  The left common carotid artery is within normal limits.  The bifurcation is unremarkable.  There is mild tortuosity of the proximal left ICA without significant stenosis.  IMPRESSION:  1.  No evidence for vertebral artery dissection or obstruction. 2.  Normal appearance of the carotid bifurcations bilaterally. 3.  Tortuosity of the internal carotid arteries bilaterally may be related to hypertension.  There is no associated stenosis.   Original Report Authenticated By: Marin Roberts, M.D.   Dg Shoulder Left  08/02/2012   *RADIOLOGY REPORT*  Clinical Data: Left shoulder pain.  Status post fall yesterday.  LEFT SHOULDER - 2+ VIEW  Comparison: None.  Findings: No fracture or dislocation.  There are minor AC joint osteoarthritic changes.  The glenohumeral joint is well preserved. No bone lesion.  The underlying ribs intact.  IMPRESSION: No fracture or dislocation or acute finding.   Original Report Authenticated By: Amie Portland, M.D.    Scheduled Meds: . aspirin EC  81 mg Oral Daily  . buPROPion  450 mg Oral q morning - 10a  . calcium-vitamin D  1 tablet Oral BID  . ciprofloxacin  400 mg Intravenous Q12H  . metoprolol succinate  12.5 mg Oral BID  . metronidazole  500 mg Intravenous Q8H   Continuous Infusions: . dextrose 5 % and 0.9% NaCl 100 mL/hr at 08/02/12 0359     Marinda Elk  [facility] Pager [phone]. If 8PM-8AM, please contact night-coverage at [email or web address], password [facility] 08/02/2012, 10:19 AM  LOS: 1 day

## 2012-08-03 MED ORDER — CIPROFLOXACIN HCL 500 MG PO TABS
500.0000 mg | ORAL_TABLET | Freq: Two times a day (BID) | ORAL | Status: DC
  Administered 2012-08-03 – 2012-08-04 (×3): 500 mg via ORAL
  Filled 2012-08-03 (×5): qty 1

## 2012-08-03 MED ORDER — METRONIDAZOLE 500 MG PO TABS
500.0000 mg | ORAL_TABLET | Freq: Three times a day (TID) | ORAL | Status: DC
  Administered 2012-08-03: 500 mg via ORAL
  Administered 2012-08-03: 22:00:00 via ORAL
  Administered 2012-08-04 (×2): 500 mg via ORAL
  Filled 2012-08-03 (×6): qty 1

## 2012-08-03 NOTE — Progress Notes (Signed)
[facility] PROGRESS NOTE Interim History: 78 y.o. female with hx of bowel obtruction s/p abdominal surgery, osteopenia on Fosamax, presents to her PCP with abdominal pain and nausea, vomiting. In the office, while obtaining a UA, she fell hard and hit her head. There was no loss of consciousness. Evaluation in the ER included a head CT, cervical CT, abdominal and pelvic CT along with lab work. She was found to have a C6 and C7 fractures, along with evidence of diverticulitis of the sigmoid colon. Neurosurgical consultation by EDP with Dr Phoebe Perch, who recommended a MRA of the neck to exclude vetebral artery dissection, which was negative. He further recommended an Aspen's collar to be placed and worn at all time until he sees her outpatient for follow up.    Assessment/Plan: Diverticulitis of colon - I agree with Cipro and flagyl 5.30.2014. Change to orals. - She is neurologically intact, M2 occlusion will have significant neurological deficit. - advance diet. - xray fracture showed no dislocation or fracture.  C6 cervical fracture/ C7 cervical fracture: - Dr. Blanche East recommended an Aspen's collar to be placed and worn at all time until he sees her outpatient for follow up.    Code Status: full Family Communication: none  Disposition Plan: inpatient   Consultants:  none  Procedures:  Ct abdomen 5.30.2014  Antibiotics:  cipro and flagyl 5.30.2014  HPI/Subjective: No abdominal pain No shoulder pain.  Objective: Filed Vitals:   08/02/12 1134 08/02/12 1400 08/02/12 2105 08/03/12 0545  BP: 129/54 139/72 162/73 148/73  Pulse: 78 80 85 79  Temp:  98.3 F (36.8 C) 98.8 F (37.1 C) 98.4 F (36.9 C)  TempSrc:  Oral Oral Oral  Resp:  18 16 16   Height:      Weight:      SpO2:  94% 96% 94%    Intake/Output Summary (Last 24 hours) at 08/03/12 0853 Last data filed at 08/03/12 0606  Gross per 24 hour  Intake 2861.67 ml  Output    200 ml  Net 2661.67 ml   Filed  Weights   08/01/12 2009  Weight: 70.308 kg (155 lb)    Exam:  General: Alert, awake, oriented x3, in no acute distress.  HEENT: No bruits, no goiter.  Heart: Regular rate and rhythm, without murmurs, rubs, gallops.  Lungs: Good air movement, bilateral air movement.  Abdomen: Soft, nontender, nondistended, positive bowel sounds.  Neuro: Grossly intact, nonfocal.   Data Reviewed: Basic Metabolic Panel:  Recent Labs Lab 08/01/12 1410  NA 136  K 3.7  CL 98  CO2 28  GLUCOSE 102*  BUN 18  CREATININE 0.97  CALCIUM 9.7   Liver Function Tests:  Recent Labs Lab 08/01/12 1410  AST 33  ALT 29  ALKPHOS 76  BILITOT 0.6  PROT 7.8  ALBUMIN 3.8    Recent Labs Lab 08/01/12 1410  LIPASE 18   No results found for this basename: AMMONIA,  in the last 168 hours CBC:  Recent Labs Lab 08/01/12 1410  WBC 14.2*  NEUTROABS 12.2*  HGB 13.4  HCT 39.5  MCV 91.2  PLT 297   Cardiac Enzymes: No results found for this basename: CKTOTAL, CKMB, CKMBINDEX, TROPONINI,  in the last 168 hours BNP (last 3 results) No results found for this basename: PROBNP,  in the last 8760 hours CBG: No results found for this basename: GLUCAP,  in the last 168 hours  Recent Results (from the past 240 hour(s))  URINE CULTURE     Status: None  Collection Time    08/01/12  2:01 PM      Result Value Range Status   Specimen Description URINE, CATHETERIZED   Final   Special Requests NONE   Final   Culture  Setup Time 08/01/2012 22:41   Final   Colony Count NO GROWTH   Final   Culture NO GROWTH   Final   Report Status 08/02/2012 FINAL   Final     Studies: Ct Abdomen Pelvis Wo Contrast  08/01/2012   *RADIOLOGY REPORT*  Clinical Data: Abdominal pain.  CT ABDOMEN AND PELVIS WITHOUT CONTRAST  Technique:  Multidetector CT imaging of the abdomen and pelvis was performed following the standard protocol without intravenous contrast.  Comparison: 04/23/2008  Findings: Inflammatory process in the left  lower quadrant of the peritoneal cavity present with thickening of a loop of sigmoid colon and also an adjacent small bowel loop.  Diffuse diverticula are present emanating from the sigmoid colon and this most likely is representative of acute diverticulitis with adjacent inflammation of small bowel.  No extraluminal air or focal abscess is identified.  Adjacent inflammatory changes are present in the mesenteric fat.  Other bowel loops are unremarkable.  Unenhanced appearance of the solid organs is stable including a left-sided renal cyst.  No masses or enlarged lymph nodes are seen.  No hernias are identified.  Bony structures show some progression of spondylosis of the lumbar spine with significant disc space narrowing present at all levels and an associated grade 1 anterolisthesis of L5 on S1.  IMPRESSION: Findings most likely consistent with acute sigmoid diverticulitis with associated adjacent inflammation of a small bowel loop.  No focal abscess is identified.   Original Report Authenticated By: Irish Lack, M.D.   Ct Head Wo Contrast  08/01/2012   *RADIOLOGY REPORT*  Clinical Data:  Headache and trauma.  Complains of neck pain. Forehead hematoma.  CT HEAD WITHOUT CONTRAST CT CERVICAL SPINE WITHOUT CONTRAST  Technique:  Multidetector CT imaging of the head and cervical spine was performed following the standard protocol without intravenous contrast.  Multiplanar CT image reconstructions of the cervical spine were also generated.  Comparison:   None  CT HEAD  Findings: There is high-density material along the posterior falx, particularly on the left side.  Findings raise concern for a small subdural hematoma.  The patient has diffuse atrophy and there is diffuse low density in the periventricular and subcortical white matter suggesting chronic changes.  There is no significant mass effect, midline shift or hydrocephalus.  Paranasal sinuses and mastoid air cells are clear.  No evidence for a calvarial  fracture.  IMPRESSION: Concern for a very small subdural hematoma along the posterior falx.  Diffuse low density in the white matter suggests chronic small vessel ischemic changes.  CT CERVICAL SPINE  Findings:    There is a minimally-displaced fracture involving the C6 left pedicle with extension into the transverse foramen.  There is also a fracture of the C6 left lamina. Displaced fracture of the left C7 superior facet.  Mild soft tissue swelling adjacent to these fractures.  Mild heterogeneity of the right thyroid lobe.  No evidence for an apical pneumothorax.  There is marked degenerative disc disease at C5-C6.  Mild anterolisthesis at C4-C5 is likely degenerative in etiology. Alignment of the cervicothoracic junction is within normal limits.  IMPRESSION: Fractures of the C6 left pedicle and C6 left lamina.  Fracture extends into the left C6 transverse foramen.  There is also a fracture of the C7  left superior facet.  A CTA or MRA of the neck would be appropriate to exclude an injury or dissection to the left vertebral artery.  Critical Value/emergent results were called by telephone at the time of interpretation on 08/01/2012 at 4:53 p.m. to Dr. Thurnell Lose, who verbally acknowledged these results.   Original Report Authenticated By: Richarda Overlie, M.D.   Ct Cervical Spine Wo Contrast  08/01/2012   *RADIOLOGY REPORT*  Clinical Data:  Headache and trauma.  Complains of neck pain. Forehead hematoma.  CT HEAD WITHOUT CONTRAST CT CERVICAL SPINE WITHOUT CONTRAST  Technique:  Multidetector CT imaging of the head and cervical spine was performed following the standard protocol without intravenous contrast.  Multiplanar CT image reconstructions of the cervical spine were also generated.  Comparison:   None  CT HEAD  Findings: There is high-density material along the posterior falx, particularly on the left side.  Findings raise concern for a small subdural hematoma.  The patient has diffuse atrophy and there is diffuse low  density in the periventricular and subcortical white matter suggesting chronic changes.  There is no significant mass effect, midline shift or hydrocephalus.  Paranasal sinuses and mastoid air cells are clear.  No evidence for a calvarial fracture.  IMPRESSION: Concern for a very small subdural hematoma along the posterior falx.  Diffuse low density in the white matter suggests chronic small vessel ischemic changes.  CT CERVICAL SPINE  Findings:    There is a minimally-displaced fracture involving the C6 left pedicle with extension into the transverse foramen.  There is also a fracture of the C6 left lamina. Displaced fracture of the left C7 superior facet.  Mild soft tissue swelling adjacent to these fractures.  Mild heterogeneity of the right thyroid lobe.  No evidence for an apical pneumothorax.  There is marked degenerative disc disease at C5-C6.  Mild anterolisthesis at C4-C5 is likely degenerative in etiology. Alignment of the cervicothoracic junction is within normal limits.  IMPRESSION: Fractures of the C6 left pedicle and C6 left lamina.  Fracture extends into the left C6 transverse foramen.  There is also a fracture of the C7 left superior facet.  A CTA or MRA of the neck would be appropriate to exclude an injury or dissection to the left vertebral artery.  Critical Value/emergent results were called by telephone at the time of interpretation on 08/01/2012 at 4:53 p.m. to Dr. Thurnell Lose, who verbally acknowledged these results.   Original Report Authenticated By: Richarda Overlie, M.D.   Mr [facility] Wo Contrast  08/01/2012   *RADIOLOGY REPORT*  Clinical Data:  Abnormal CT of the head.  Cervical spine fractures. Fracture through the vertebral canal.  MRI HEAD WITHOUT AND WITH CONTRAST MRA HEAD WITHOUT CONTRAST MRA NECK WITHOUT AND WITH CONTRAST  Technique:  Multiplanar, multiecho pulse sequences of the brain and surrounding structures were obtained without and with intravenous contrast.  Angiographic images of the  Circle of Willis were obtained using MRA technique without intravenous contrast. Angiographic images of the neck were obtained using MRA technique without and with intravenous contrast.  Carotid stenosis measurements (when applicable) are obtained utilizing NASCET criteria, using the distal internal carotid diameter as the denominator.  Contrast: 15mL MULTIHANCE GADOBENATE DIMEGLUMINE 529 MG/ML IV SOLN  Comparison:  CT head and cervical spine from the same day.  MRI HEAD  Findings:  No acute infarct, hemorrhage, or mass lesion is present. Moderate generalized atrophy diffuse white matter disease is evident.  The ventricles are proportionate to the degree of  atrophy.  Flow is present in the major intracranial arteries.  The patient is status post bilateral lens extractions.  The globes and orbits are otherwise intact.  Mild mucosal thickening is present in the ethmoid air cells bilaterally.  The postcontrast images demonstrate no pathologic enhancement.  IMPRESSION:  1.  No acute intracranial abnormality. 2.  Moderate atrophy and generalized white matter disease.  This likely reflects the sequelae of chronic microvascular ischemia. 3.  Minimal sinus disease.  MRA HEAD  Findings: The internal carotid arteries are within normal limits from the high cervical segments through the ICA termini bilaterally.  There is early bifurcation of the left middle cerebral artery, a normal variant.  The A1 and M1 segments are otherwise within normal limits.  The anterior communicating artery is patent.  Mild distal branch vessel disease is evident bilaterally.  A left M2 segment is occluded approximately 15 mm from its origin.  The left vertebral artery is slightly dominant to the right. Prominent AICA vessels are noted bilaterally.  Both posterior cerebral arteries originate from basilar tip.  A prominent left posterior communicating artery is present.  There is mild attenuation of distal PCA branch vessels.  IMPRESSION:  1.  Mild  distal small vessel disease. 2.  Question occlusion of a left M2 segment approximately 15 mm from the bifurcation.  MRA NECK  Findings: There is no significant to flow disturbance at either carotid bifurcation.  Flow is antegrade within the vertebral arteries bilaterally.  There is a common origin of the innominate artery and left common carotid artery.  The vertebral arteries both originate from the subclavian arteries. There is moderate tortuosity in the proximal vertebral arteries. The vessel caliber is maintained.  There is no evidence for dissection or obstruction.  The right common carotid artery is within normal limits.  The carotid bifurcation is unremarkable.  There is mild tortuosity of the cervical right ICA without significant stenosis.  The left common carotid artery is within normal limits.  The bifurcation is unremarkable.  There is mild tortuosity of the proximal left ICA without significant stenosis.  IMPRESSION:  1.  No evidence for vertebral artery dissection or obstruction. 2.  Normal appearance of the carotid bifurcations bilaterally. 3.  Tortuosity of the internal carotid arteries bilaterally may be related to hypertension.  There is no associated stenosis.   Original Report Authenticated By: Marin Roberts, M.D.   Mr Angiogram Neck W Wo Contrast  08/01/2012   *RADIOLOGY REPORT*  Clinical Data:  Abnormal CT of the head.  Cervical spine fractures. Fracture through the vertebral canal.  MRI HEAD WITHOUT AND WITH CONTRAST MRA HEAD WITHOUT CONTRAST MRA NECK WITHOUT AND WITH CONTRAST  Technique:  Multiplanar, multiecho pulse sequences of the brain and surrounding structures were obtained without and with intravenous contrast.  Angiographic images of the Circle of Willis were obtained using MRA technique without intravenous contrast. Angiographic images of the neck were obtained using MRA technique without and with intravenous contrast.  Carotid stenosis measurements (when applicable) are  obtained utilizing NASCET criteria, using the distal internal carotid diameter as the denominator.  Contrast: 15mL MULTIHANCE GADOBENATE DIMEGLUMINE 529 MG/ML IV SOLN  Comparison:  CT head and cervical spine from the same day.  MRI HEAD  Findings:  No acute infarct, hemorrhage, or mass lesion is present. Moderate generalized atrophy diffuse white matter disease is evident.  The ventricles are proportionate to the degree of atrophy.  Flow is present in the major intracranial arteries.  The patient is status  post bilateral lens extractions.  The globes and orbits are otherwise intact.  Mild mucosal thickening is present in the ethmoid air cells bilaterally.  The postcontrast images demonstrate no pathologic enhancement.  IMPRESSION:  1.  No acute intracranial abnormality. 2.  Moderate atrophy and generalized white matter disease.  This likely reflects the sequelae of chronic microvascular ischemia. 3.  Minimal sinus disease.  MRA HEAD  Findings: The internal carotid arteries are within normal limits from the high cervical segments through the ICA termini bilaterally.  There is early bifurcation of the left middle cerebral artery, a normal variant.  The A1 and M1 segments are otherwise within normal limits.  The anterior communicating artery is patent.  Mild distal branch vessel disease is evident bilaterally.  A left M2 segment is occluded approximately 15 mm from its origin.  The left vertebral artery is slightly dominant to the right. Prominent AICA vessels are noted bilaterally.  Both posterior cerebral arteries originate from basilar tip.  A prominent left posterior communicating artery is present.  There is mild attenuation of distal PCA branch vessels.  IMPRESSION:  1.  Mild distal small vessel disease. 2.  Question occlusion of a left M2 segment approximately 15 mm from the bifurcation.  MRA NECK  Findings: There is no significant to flow disturbance at either carotid bifurcation.  Flow is antegrade within the  vertebral arteries bilaterally.  There is a common origin of the innominate artery and left common carotid artery.  The vertebral arteries both originate from the subclavian arteries. There is moderate tortuosity in the proximal vertebral arteries. The vessel caliber is maintained.  There is no evidence for dissection or obstruction.  The right common carotid artery is within normal limits.  The carotid bifurcation is unremarkable.  There is mild tortuosity of the cervical right ICA without significant stenosis.  The left common carotid artery is within normal limits.  The bifurcation is unremarkable.  There is mild tortuosity of the proximal left ICA without significant stenosis.  IMPRESSION:  1.  No evidence for vertebral artery dissection or obstruction. 2.  Normal appearance of the carotid bifurcations bilaterally. 3.  Tortuosity of the internal carotid arteries bilaterally may be related to hypertension.  There is no associated stenosis.   Original Report Authenticated By: Marin Roberts, M.D.   Mr Laqueta Jean Wo Contrast  08/01/2012   *RADIOLOGY REPORT*  Clinical Data:  Abnormal CT of the head.  Cervical spine fractures. Fracture through the vertebral canal.  MRI HEAD WITHOUT AND WITH CONTRAST MRA HEAD WITHOUT CONTRAST MRA NECK WITHOUT AND WITH CONTRAST  Technique:  Multiplanar, multiecho pulse sequences of the brain and surrounding structures were obtained without and with intravenous contrast.  Angiographic images of the Circle of Willis were obtained using MRA technique without intravenous contrast. Angiographic images of the neck were obtained using MRA technique without and with intravenous contrast.  Carotid stenosis measurements (when applicable) are obtained utilizing NASCET criteria, using the distal internal carotid diameter as the denominator.  Contrast: 15mL MULTIHANCE GADOBENATE DIMEGLUMINE 529 MG/ML IV SOLN  Comparison:  CT head and cervical spine from the same day.  MRI HEAD  Findings:  No  acute infarct, hemorrhage, or mass lesion is present. Moderate generalized atrophy diffuse white matter disease is evident.  The ventricles are proportionate to the degree of atrophy.  Flow is present in the major intracranial arteries.  The patient is status post bilateral lens extractions.  The globes and orbits are otherwise intact.  Mild mucosal thickening  is present in the ethmoid air cells bilaterally.  The postcontrast images demonstrate no pathologic enhancement.  IMPRESSION:  1.  No acute intracranial abnormality. 2.  Moderate atrophy and generalized white matter disease.  This likely reflects the sequelae of chronic microvascular ischemia. 3.  Minimal sinus disease.  MRA HEAD  Findings: The internal carotid arteries are within normal limits from the high cervical segments through the ICA termini bilaterally.  There is early bifurcation of the left middle cerebral artery, a normal variant.  The A1 and M1 segments are otherwise within normal limits.  The anterior communicating artery is patent.  Mild distal branch vessel disease is evident bilaterally.  A left M2 segment is occluded approximately 15 mm from its origin.  The left vertebral artery is slightly dominant to the right. Prominent AICA vessels are noted bilaterally.  Both posterior cerebral arteries originate from basilar tip.  A prominent left posterior communicating artery is present.  There is mild attenuation of distal PCA branch vessels.  IMPRESSION:  1.  Mild distal small vessel disease. 2.  Question occlusion of a left M2 segment approximately 15 mm from the bifurcation.  MRA NECK  Findings: There is no significant to flow disturbance at either carotid bifurcation.  Flow is antegrade within the vertebral arteries bilaterally.  There is a common origin of the innominate artery and left common carotid artery.  The vertebral arteries both originate from the subclavian arteries. There is moderate tortuosity in the proximal vertebral arteries. The  vessel caliber is maintained.  There is no evidence for dissection or obstruction.  The right common carotid artery is within normal limits.  The carotid bifurcation is unremarkable.  There is mild tortuosity of the cervical right ICA without significant stenosis.  The left common carotid artery is within normal limits.  The bifurcation is unremarkable.  There is mild tortuosity of the proximal left ICA without significant stenosis.  IMPRESSION:  1.  No evidence for vertebral artery dissection or obstruction. 2.  Normal appearance of the carotid bifurcations bilaterally. 3.  Tortuosity of the internal carotid arteries bilaterally may be related to hypertension.  There is no associated stenosis.   Original Report Authenticated By: Marin Roberts, M.D.   Dg Shoulder Left  08/02/2012   *RADIOLOGY REPORT*  Clinical Data: Left shoulder pain.  Status post fall yesterday.  LEFT SHOULDER - 2+ VIEW  Comparison: None.  Findings: No fracture or dislocation.  There are minor AC joint osteoarthritic changes.  The glenohumeral joint is well preserved. No bone lesion.  The underlying ribs intact.  IMPRESSION: No fracture or dislocation or acute finding.   Original Report Authenticated By: Amie Portland, M.D.    Scheduled Meds: . aspirin EC  81 mg Oral Daily  . buPROPion  450 mg Oral q morning - 10a  . calcium-vitamin D  1 tablet Oral BID  . ciprofloxacin  500 mg Oral BID  . metoprolol succinate  12.5 mg Oral BID  . metroNIDAZOLE  500 mg Oral Q8H   Continuous Infusions: . dextrose 5 % and 0.9% NaCl 100 mL/hr at 08/03/12 0302     Marinda Elk  [facility] Pager [phone]. If 8PM-8AM, please contact night-coverage at [email or web address], password [facility] 08/03/2012, 8:53 AM  LOS: 2 days

## 2012-08-04 MED ORDER — METRONIDAZOLE 500 MG PO TABS: 500.0000 mg | ORAL_TABLET | Freq: Three times a day (TID) | ORAL | Status: DC

## 2012-08-04 MED ORDER — CIPROFLOXACIN HCL 500 MG PO TABS: 500.0000 mg | ORAL_TABLET | Freq: Two times a day (BID) | ORAL | Status: DC

## 2012-08-04 NOTE — Care Management Note (Signed)
    Page 1 of 1   08/04/2012     1:38:05 PM   CARE MANAGEMENT NOTE 08/04/2012  Patient:  DILYN, OSORIA A   Account Number:  [id number]  Date Initiated:  08/02/2012  Documentation initiated by:  [facility]  Subjective/Objective Assessment:   78 year old female admitted with diverticulitis.     Action/Plan:   From home with spouse.   Anticipated DC Date:  08/05/2012   Anticipated DC Plan:  SKILLED NURSING FACILITY  In-house referral  Clinical Social Worker      DC Planning Services  CM consult      Choice offered to / List presented to:  C-1 Patient           Status of service:  Completed, signed off Medicare Important Message given?  NA - LOS <3 / Initial given by admissions (If response is "NO", the following Medicare IM given date fields will be blank) Date Medicare IM given:   Date Additional Medicare IM given:    Discharge Disposition:  SKILLED NURSING FACILITY  Per UR Regulation:  Reviewed for med. necessity/level of care/duration of stay  If discussed at [place] Length of Stay Meetings, dates discussed:    Comments:  08-04-12 Lorenda Ishihara RN CM 1300 Per PT recommendations for SNF for rehab. Patient and family agree. Awaiting f/u from CSW regarding bed offers.  1000 Recieved a referral for [facility] service setup. Spoke with patient at bedside, she is considering SNF. Discussed with her options, left list of HH agencies for choice and contacted CSW to assist with possible SNF. Awaiting PT evaluations.

## 2012-08-04 NOTE — Progress Notes (Signed)
Clinical Social Work Department BRIEF PSYCHOSOCIAL ASSESSMENT 08/04/2012  Patient:  Adriana [name], Adriana [name]     Account Number:  [id number]     Admit date:  08/01/2012  Clinical Social Worker:  Dennison Bulla  Date/Time:  08/04/2012 10:30 AM  Referred by:  Care Management  Date Referred:  08/04/2012 Referred for  SNF Placement   Other Referral:   Interview type:  Patient Other interview type:    PSYCHOSOCIAL DATA Living Status:  FAMILY Admitted from facility:   Level of care:   Primary support name:  Lollie Sails Primary support relationship to patient:  SPOUSE Degree of support available:   Strong    CURRENT CONCERNS Current Concerns  Post-Acute Placement   Other Concerns:    SOCIAL WORK ASSESSMENT / PLAN CSW received referral to assist with DC planning. Per CM, patient possibly interested in SNF placement. CSW reviewed chart and met with patient at bedside.    CSW introduced myself and explained role. Patient reports she lives at home with her husband. Husband is retired and provides 24 hour care but patient reports that husband is frail. Patient reports that her children are encouraging her to go to SNF. Patient reports that it is her decision and she wants to wait to work with PT to make final decision.    CSW provided patient with SNF list and explained that [place]. Patient reports she has visited a few facilities but has not stayed at one in the past. CSW explained copays associated with insurance for SNF stay. Patient understanding that she has been DC and decision regarding HH vs SNF will need to be made today as well.    CSW completed FL2 and faxed out to [facility]. CSW will follow up with bed offers.   Assessment/plan status:  Psychosocial Support/Ongoing Assessment of Needs Other assessment/ plan:   Information/referral to community resources:   SNF list    PATIENT'S/FAMILY'S RESPONSE TO PLAN OF CARE: Patient alert and oriented. Patient  thanked CSW for information but has not fully decided [facility] vs SNF. Patient agreeable to CSW follow up to assist with DC planning.       Coverage for [facility]

## 2012-08-04 NOTE — Progress Notes (Signed)
Clinical Social Work  CSW met with patient and husband who have agreed on SNF and prefer [facility]. CSW faxed DC summary to SNF and representative is coming to the hospital to complete paperwork with patient. CSW explained DC process and patient prefers to use [facility] to transport. CSW prepared DC packet with FL2 included. CSW coordinated transportation via [facility] for 1530 pick up. (Service Request [phone]) CSW is signing off but available if needed.  Unk Lightning, LCSW (Coverage for [employer])

## 2012-08-04 NOTE — Evaluation (Signed)
Physical Therapy Evaluation Patient Details Name: Adriana [name] MRN: [id number] DOB: August 08, 1934 Today's Date: 08/04/2012 Time: 1135-1207 PT Time Calculation (min): 32 min  PT Assessment / Plan / Recommendation Clinical Impression  pt  presents with hx of frequent falls, C6-C7 fx this adm and will benefit from PT this venue and post acute rehab as well;    PT Assessment  Patient needs continued PT services    Follow Up Recommendations  SNF    Does the patient have the potential to tolerate intense rehabilitation      Barriers to Discharge        Equipment Recommendations  Rolling walker with 5" wheels    Recommendations for Other Services     Frequency Min 3X/week    Precautions / Restrictions Precautions Precautions: Fall Required Braces or Orthoses: Cervical Brace Cervical Brace: Applied in supine position;Hard collar (Aspen) Other Brace/Splint: RN to call and have Aspen collar fit checked; PT adjusted but pt still quite mobile in this collar   Pertinent Vitals/Pain Neck pain; RN aware      Mobility  Bed Mobility Bed Mobility: Rolling Right;Right Sidelying to Sit Rolling Right: 4: Min assist;4: Min guard Right Sidelying to Sit: HOB elevated;4: Min assist Transfers Transfers: Sit to Stand;Stand to Sit Sit to Stand: 4: Min assist;From bed Stand to Sit: 4: Min assist;To chair/3-in-1 Details for Transfer Assistance: min for initial balance;  Ambulation/Gait Ambulation/Gait Assistance: 4: Min assist Ambulation Distance (Feet): 95 Feet Assistive device: 1 person hand held assist Ambulation/Gait Assistance Details: pt with unsteady gait, LOB x 3 with min to recover Gait Pattern: Decreased step length - right;Narrow base of support Gait velocity: decreased    Exercises     PT Diagnosis: Difficulty walking  PT Problem List: Decreased strength;Decreased range of motion;Decreased activity tolerance;Decreased balance;Decreased mobility;Decreased knowledge of use of  DME PT Treatment Interventions: DME instruction;Gait training;Functional mobility training;Therapeutic exercise;Therapeutic activities;Patient/family education;Balance training   PT Goals Acute Rehab PT Goals PT Goal Formulation: With patient Time For Goal Achievement: 08/18/12 Potential to Achieve Goals: Good Pt will go Supine/Side to Sit: with supervision PT Goal: Supine/Side to Sit - Progress: Goal set today Pt will go Sit to Stand: with supervision PT Goal: Sit to Stand - Progress: Goal set today Pt will Ambulate: >150 feet;with supervision;with least restrictive assistive device PT Goal: Ambulate - Progress: Goal set today  Visit Information  Last PT Received On: 08/04/12 Assistance Needed: +1    Subjective Data  Subjective: these balance problems are concerning Patient Stated Goal: maybe rehab   Prior Functioning  Home Living Lives With: Spouse Available Help at Discharge: Skilled Nursing Facility (??) Type of Home: House Home Access: Stairs to enter [employer] of Steps: 3 Entrance Stairs-Rails: Right Home Layout: One level Bathroom Shower/Tub: Tub/shower unit;Walk-in shower Bathroom Toilet: Standard Home Adaptive Equipment: Straight cane Additional Comments: hx of falls, at least 1 per month; falls usually happen in daytime, doing activities; more fequent recently Prior Function Level of Independence: Independent Able to Take Stairs?: Yes Communication Communication: No difficulties Dominant Hand: Right    Cognition  Cognition Arousal/Alertness: Awake/alert Behavior During Therapy: WFL for tasks assessed/performed Overall Cognitive Status: Within Functional Limits for tasks assessed    Extremity/Trunk Assessment Right Upper Extremity Assessment RUE ROM/Strength/Tone: Unable to fully assess;Due to precautions Left Upper Extremity Assessment LUE ROM/Strength/Tone: Unable to fully assess;Due to precautions Right Lower Extremity Assessment RLE  ROM/Strength/Tone: Deficits RLE ROM/Strength/Tone Deficits: Did not test strength of BUE due to cervical fracture. Pt  able to feed self and lift BUe against gravity Left Lower Extremity Assessment LLE ROM/Strength/Tone: Deficits LLE ROM/Strength/Tone Deficits: same   Balance Dynamic Standing Balance Dynamic Standing - Balance Support: During functional activity;Bilateral upper extremity supported Dynamic Standing - Level of Assistance: 4: Min assist High Level Balance High Level Balance Activites: Direction changes;Turns High Level Balance Comments: unilateral steps  End of Session PT - End of Session Equipment Utilized During Treatment: Gait belt Activity Tolerance: Patient tolerated treatment well Patient left: in bed;with call bell/phone within reach  GP     [facility] 08/04/2012, 1:57 PM

## 2012-08-04 NOTE — Discharge Summary (Signed)
Physician Discharge Summary  Adriana [name] ZOX:[id number] DOB: Feb 26, 1935 DOA: 08/01/2012  PCP: Gweneth Dimitri, MD  Admit date: 08/01/2012 Discharge date: 08/04/2012  Time spent: 35 minutes  Recommendations for Outpatient Follow-up:  1. Follow up with PCP as an outpatient in 2 weeks.  Discharge Diagnoses:  Principal Problem:   Diverticulitis of colon Active Problems:   C6 cervical fracture   C7 cervical fracture   Discharge Condition: stable  Diet recommendation: heart healthy  Filed Weights   08/01/12 2009  Weight: 70.308 kg (155 lb)    History of present illness:  Please H&P for further details.78 y.o. female with hx of bowel obtruction s/p abdominal surgery, osteopenia on Fosamax, presents to her PCP with abdominal pain and nausea, vomiting. In the office, while obtaining a UA, she fell hard and hit her head. There was no loss of consciousness. Evaluation in the ER included a head CT, cervical CT, abdominal and pelvic CT along with lab work. She was found to have a C6 and C7 fractures, along with evidence of diverticulitis of the sigmoid colon. Neurosurgical consultation by EDP with Dr Phoebe Perch, who recommended a MRA of the neck to exclude vetebral artery dissection, which was negative. He further recommended an Aspen's collar to be placed and worn at all time until he sees her outpatient for follow up.    Hospital Course:  Diverticulitis of colon  - I agree with Cipro and flagyl 5.30.2014. Change to orals. Will continue as an outpatient for a total of 14 days. - She is neurologically intact, M2 occlusion will have significant neurological deficit.  - tolerate diet and oral meds.   C6 cervical fracture/ C7 cervical fracture:  - Dr. Blanche East recommended an Aspen's collar to be placed and worn at all time until he sees her outpatient for follow up.    Procedures:  Ct abdomen  Ct head  C-spine CT  Consultations:  none  Discharge Exam: Filed Vitals:   08/03/12 1020  08/03/12 1445 08/03/12 2120 08/04/12 0412  BP: 132/80 136/83 158/88 162/78  Pulse: 75 75 88 75  Temp:  98.1 F (36.7 C) 98.2 F (36.8 C) 97.7 F (36.5 C)  TempSrc:  Oral Oral Oral  Resp:  18 16 16   Height:      Weight:      SpO2:  95% 97% 96%    General: A&O x3 Cardiovascular: RRR Respiratory: good air movement CTA B/L   Discharge Instructions  Discharge Orders   Future Orders Complete By Expires     Diet - low sodium heart healthy  As directed     Increase activity slowly  As directed         Medication List    TAKE these medications       alendronate 70 MG tablet  Commonly known as:  FOSAMAX  Take 70 mg by mouth every 7 (seven) days. Take with a full glass of water on an empty stomach.  Taken on Sundays.     aspirin EC 81 MG tablet  Take 81 mg by mouth daily.     buPROPion 150 MG 12 hr tablet  Commonly known as:  WELLBUTRIN SR  Take 450 mg by mouth every morning.     calcium-vitamin D 500-200 MG-UNIT per tablet  Commonly known as:  OSCAL WITH D  Take 1 tablet by mouth 2 (two) times daily.     ciprofloxacin 500 MG tablet  Commonly known as:  CIPRO  Take 1 tablet (500 mg total) by  mouth 2 (two) times daily.     glucosamine-chondroitin 500-400 MG tablet  Take 1 tablet by mouth daily.     metoprolol succinate 25 MG 24 hr tablet  Commonly known as:  TOPROL-XL  Take 12.5 mg by mouth 2 (two) times daily.     metroNIDAZOLE 500 MG tablet  Commonly known as:  FLAGYL  Take 1 tablet (500 mg total) by mouth every 8 (eight) hours.     multivitamin with minerals Tabs  Take 1 tablet by mouth daily.     nitroGLYCERIN 0.4 MG SL tablet  Commonly known as:  NITROSTAT  Place 0.4 mg under the tongue every 5 (five) minutes as needed for chest pain.       Allergies  Allergen Reactions  . Codeine   . Contrast Media (Iodinated Diagnostic Agents)   . Darvocet (Propoxyphene-Acetaminophen) Diarrhea and Nausea And Vomiting  . Erythrocin   . Iodine       The  results of significant diagnostics from this hospitalization (including imaging, microbiology, ancillary and laboratory) are listed below for reference.    Significant Diagnostic Studies: Ct Abdomen Pelvis Wo Contrast  08/01/2012   *RADIOLOGY REPORT*  Clinical Data: Abdominal pain.  CT ABDOMEN AND PELVIS WITHOUT CONTRAST  Technique:  Multidetector CT imaging of the abdomen and pelvis was performed following the standard protocol without intravenous contrast.  Comparison: 04/23/2008  Findings: Inflammatory process in the left lower quadrant of the peritoneal cavity present with thickening of a loop of sigmoid colon and also an adjacent small bowel loop.  Diffuse diverticula are present emanating from the sigmoid colon and this most likely is representative of acute diverticulitis with adjacent inflammation of small bowel.  No extraluminal air or focal abscess is identified.  Adjacent inflammatory changes are present in the mesenteric fat.  Other bowel loops are unremarkable.  Unenhanced appearance of the solid organs is stable including a left-sided renal cyst.  No masses or enlarged lymph nodes are seen.  No hernias are identified.  Bony structures show some progression of spondylosis of the lumbar spine with significant disc space narrowing present at all levels and an associated grade 1 anterolisthesis of L5 on S1.  IMPRESSION: Findings most likely consistent with acute sigmoid diverticulitis with associated adjacent inflammation of a small bowel loop.  No focal abscess is identified.   Original Report Authenticated By: Irish Lack, M.D.   Ct Head Wo Contrast  08/01/2012   *RADIOLOGY REPORT*  Clinical Data:  Headache and trauma.  Complains of neck pain. Forehead hematoma.  CT HEAD WITHOUT CONTRAST CT CERVICAL SPINE WITHOUT CONTRAST  Technique:  Multidetector CT imaging of the head and cervical spine was performed following the standard protocol without intravenous contrast.  Multiplanar CT image  reconstructions of the cervical spine were also generated.  Comparison:   None  CT HEAD  Findings: There is high-density material along the posterior falx, particularly on the left side.  Findings raise concern for a small subdural hematoma.  The patient has diffuse atrophy and there is diffuse low density in the periventricular and subcortical white matter suggesting chronic changes.  There is no significant mass effect, midline shift or hydrocephalus.  Paranasal sinuses and mastoid air cells are clear.  No evidence for a calvarial fracture.  IMPRESSION: Concern for a very small subdural hematoma along the posterior falx.  Diffuse low density in the white matter suggests chronic small vessel ischemic changes.  CT CERVICAL SPINE  Findings:    There is a minimally-displaced fracture  involving the C6 left pedicle with extension into the transverse foramen.  There is also a fracture of the C6 left lamina. Displaced fracture of the left C7 superior facet.  Mild soft tissue swelling adjacent to these fractures.  Mild heterogeneity of the right thyroid lobe.  No evidence for an apical pneumothorax.  There is marked degenerative disc disease at C5-C6.  Mild anterolisthesis at C4-C5 is likely degenerative in etiology. Alignment of the cervicothoracic junction is within normal limits.  IMPRESSION: Fractures of the C6 left pedicle and C6 left lamina.  Fracture extends into the left C6 transverse foramen.  There is also a fracture of the C7 left superior facet.  A CTA or MRA of the neck would be appropriate to exclude an injury or dissection to the left vertebral artery.  Critical Value/emergent results were called by telephone at the time of interpretation on 08/01/2012 at 4:53 p.m. to Dr. Thurnell Lose, who verbally acknowledged these results.   Original Report Authenticated By: Richarda Overlie, M.D.   Ct Cervical Spine Wo Contrast  08/01/2012   *RADIOLOGY REPORT*  Clinical Data:  Headache and trauma.  Complains of neck pain. Forehead  hematoma.  CT HEAD WITHOUT CONTRAST CT CERVICAL SPINE WITHOUT CONTRAST  Technique:  Multidetector CT imaging of the head and cervical spine was performed following the standard protocol without intravenous contrast.  Multiplanar CT image reconstructions of the cervical spine were also generated.  Comparison:   None  CT HEAD  Findings: There is high-density material along the posterior falx, particularly on the left side.  Findings raise concern for a small subdural hematoma.  The patient has diffuse atrophy and there is diffuse low density in the periventricular and subcortical white matter suggesting chronic changes.  There is no significant mass effect, midline shift or hydrocephalus.  Paranasal sinuses and mastoid air cells are clear.  No evidence for a calvarial fracture.  IMPRESSION: Concern for a very small subdural hematoma along the posterior falx.  Diffuse low density in the white matter suggests chronic small vessel ischemic changes.  CT CERVICAL SPINE  Findings:    There is a minimally-displaced fracture involving the C6 left pedicle with extension into the transverse foramen.  There is also a fracture of the C6 left lamina. Displaced fracture of the left C7 superior facet.  Mild soft tissue swelling adjacent to these fractures.  Mild heterogeneity of the right thyroid lobe.  No evidence for an apical pneumothorax.  There is marked degenerative disc disease at C5-C6.  Mild anterolisthesis at C4-C5 is likely degenerative in etiology. Alignment of the cervicothoracic junction is within normal limits.  IMPRESSION: Fractures of the C6 left pedicle and C6 left lamina.  Fracture extends into the left C6 transverse foramen.  There is also a fracture of the C7 left superior facet.  A CTA or MRA of the neck would be appropriate to exclude an injury or dissection to the left vertebral artery.  Critical Value/emergent results were called by telephone at the time of interpretation on 08/01/2012 at 4:53 p.m. to Dr.  Thurnell Lose, who verbally acknowledged these results.   Original Report Authenticated By: Richarda Overlie, M.D.   Mr [facility] Wo Contrast  08/01/2012   *RADIOLOGY REPORT*  Clinical Data:  Abnormal CT of the head.  Cervical spine fractures. Fracture through the vertebral canal.  MRI HEAD WITHOUT AND WITH CONTRAST MRA HEAD WITHOUT CONTRAST MRA NECK WITHOUT AND WITH CONTRAST  Technique:  Multiplanar, multiecho pulse sequences of the brain and surrounding structures were  obtained without and with intravenous contrast.  Angiographic images of the Circle of Willis were obtained using MRA technique without intravenous contrast. Angiographic images of the neck were obtained using MRA technique without and with intravenous contrast.  Carotid stenosis measurements (when applicable) are obtained utilizing NASCET criteria, using the distal internal carotid diameter as the denominator.  Contrast: 15mL MULTIHANCE GADOBENATE DIMEGLUMINE 529 MG/ML IV SOLN  Comparison:  CT head and cervical spine from the same day.  MRI HEAD  Findings:  No acute infarct, hemorrhage, or mass lesion is present. Moderate generalized atrophy diffuse white matter disease is evident.  The ventricles are proportionate to the degree of atrophy.  Flow is present in the major intracranial arteries.  The patient is status post bilateral lens extractions.  The globes and orbits are otherwise intact.  Mild mucosal thickening is present in the ethmoid air cells bilaterally.  The postcontrast images demonstrate no pathologic enhancement.  IMPRESSION:  1.  No acute intracranial abnormality. 2.  Moderate atrophy and generalized white matter disease.  This likely reflects the sequelae of chronic microvascular ischemia. 3.  Minimal sinus disease.  MRA HEAD  Findings: The internal carotid arteries are within normal limits from the high cervical segments through the ICA termini bilaterally.  There is early bifurcation of the left middle cerebral artery, a normal variant.  The A1  and M1 segments are otherwise within normal limits.  The anterior communicating artery is patent.  Mild distal branch vessel disease is evident bilaterally.  A left M2 segment is occluded approximately 15 mm from its origin.  The left vertebral artery is slightly dominant to the right. Prominent AICA vessels are noted bilaterally.  Both posterior cerebral arteries originate from basilar tip.  A prominent left posterior communicating artery is present.  There is mild attenuation of distal PCA branch vessels.  IMPRESSION:  1.  Mild distal small vessel disease. 2.  Question occlusion of a left M2 segment approximately 15 mm from the bifurcation.  MRA NECK  Findings: There is no significant to flow disturbance at either carotid bifurcation.  Flow is antegrade within the vertebral arteries bilaterally.  There is a common origin of the innominate artery and left common carotid artery.  The vertebral arteries both originate from the subclavian arteries. There is moderate tortuosity in the proximal vertebral arteries. The vessel caliber is maintained.  There is no evidence for dissection or obstruction.  The right common carotid artery is within normal limits.  The carotid bifurcation is unremarkable.  There is mild tortuosity of the cervical right ICA without significant stenosis.  The left common carotid artery is within normal limits.  The bifurcation is unremarkable.  There is mild tortuosity of the proximal left ICA without significant stenosis.  IMPRESSION:  1.  No evidence for vertebral artery dissection or obstruction. 2.  Normal appearance of the carotid bifurcations bilaterally. 3.  Tortuosity of the internal carotid arteries bilaterally may be related to hypertension.  There is no associated stenosis.   Original Report Authenticated By: Marin Roberts, M.D.   Mr Angiogram Neck W Wo Contrast  08/01/2012   *RADIOLOGY REPORT*  Clinical Data:  Abnormal CT of the head.  Cervical spine fractures. Fracture  through the vertebral canal.  MRI HEAD WITHOUT AND WITH CONTRAST MRA HEAD WITHOUT CONTRAST MRA NECK WITHOUT AND WITH CONTRAST  Technique:  Multiplanar, multiecho pulse sequences of the brain and surrounding structures were obtained without and with intravenous contrast.  Angiographic images of the Circle of Willis were  obtained using MRA technique without intravenous contrast. Angiographic images of the neck were obtained using MRA technique without and with intravenous contrast.  Carotid stenosis measurements (when applicable) are obtained utilizing NASCET criteria, using the distal internal carotid diameter as the denominator.  Contrast: 15mL MULTIHANCE GADOBENATE DIMEGLUMINE 529 MG/ML IV SOLN  Comparison:  CT head and cervical spine from the same day.  MRI HEAD  Findings:  No acute infarct, hemorrhage, or mass lesion is present. Moderate generalized atrophy diffuse white matter disease is evident.  The ventricles are proportionate to the degree of atrophy.  Flow is present in the major intracranial arteries.  The patient is status post bilateral lens extractions.  The globes and orbits are otherwise intact.  Mild mucosal thickening is present in the ethmoid air cells bilaterally.  The postcontrast images demonstrate no pathologic enhancement.  IMPRESSION:  1.  No acute intracranial abnormality. 2.  Moderate atrophy and generalized white matter disease.  This likely reflects the sequelae of chronic microvascular ischemia. 3.  Minimal sinus disease.  MRA HEAD  Findings: The internal carotid arteries are within normal limits from the high cervical segments through the ICA termini bilaterally.  There is early bifurcation of the left middle cerebral artery, a normal variant.  The A1 and M1 segments are otherwise within normal limits.  The anterior communicating artery is patent.  Mild distal branch vessel disease is evident bilaterally.  A left M2 segment is occluded approximately 15 mm from its origin.  The left  vertebral artery is slightly dominant to the right. Prominent AICA vessels are noted bilaterally.  Both posterior cerebral arteries originate from basilar tip.  A prominent left posterior communicating artery is present.  There is mild attenuation of distal PCA branch vessels.  IMPRESSION:  1.  Mild distal small vessel disease. 2.  Question occlusion of a left M2 segment approximately 15 mm from the bifurcation.  MRA NECK  Findings: There is no significant to flow disturbance at either carotid bifurcation.  Flow is antegrade within the vertebral arteries bilaterally.  There is a common origin of the innominate artery and left common carotid artery.  The vertebral arteries both originate from the subclavian arteries. There is moderate tortuosity in the proximal vertebral arteries. The vessel caliber is maintained.  There is no evidence for dissection or obstruction.  The right common carotid artery is within normal limits.  The carotid bifurcation is unremarkable.  There is mild tortuosity of the cervical right ICA without significant stenosis.  The left common carotid artery is within normal limits.  The bifurcation is unremarkable.  There is mild tortuosity of the proximal left ICA without significant stenosis.  IMPRESSION:  1.  No evidence for vertebral artery dissection or obstruction. 2.  Normal appearance of the carotid bifurcations bilaterally. 3.  Tortuosity of the internal carotid arteries bilaterally may be related to hypertension.  There is no associated stenosis.   Original Report Authenticated By: Marin Roberts, M.D.   Mr [facility] Wo Contrast  08/01/2012   *RADIOLOGY REPORT*  Clinical Data:  Abnormal CT of the head.  Cervical spine fractures. Fracture through the vertebral canal.  MRI HEAD WITHOUT AND WITH CONTRAST MRA HEAD WITHOUT CONTRAST MRA NECK WITHOUT AND WITH CONTRAST  Technique:  Multiplanar, multiecho pulse sequences of the brain and surrounding structures were obtained without and with  intravenous contrast.  Angiographic images of the Circle of Willis were obtained using MRA technique without intravenous contrast. Angiographic images of the neck were obtained using MRA  technique without and with intravenous contrast.  Carotid stenosis measurements (when applicable) are obtained utilizing NASCET criteria, using the distal internal carotid diameter as the denominator.  Contrast: 15mL MULTIHANCE GADOBENATE DIMEGLUMINE 529 MG/ML IV SOLN  Comparison:  CT head and cervical spine from the same day.  MRI HEAD  Findings:  No acute infarct, hemorrhage, or mass lesion is present. Moderate generalized atrophy diffuse white matter disease is evident.  The ventricles are proportionate to the degree of atrophy.  Flow is present in the major intracranial arteries.  The patient is status post bilateral lens extractions.  The globes and orbits are otherwise intact.  Mild mucosal thickening is present in the ethmoid air cells bilaterally.  The postcontrast images demonstrate no pathologic enhancement.  IMPRESSION:  1.  No acute intracranial abnormality. 2.  Moderate atrophy and generalized white matter disease.  This likely reflects the sequelae of chronic microvascular ischemia. 3.  Minimal sinus disease.  MRA HEAD  Findings: The internal carotid arteries are within normal limits from the high cervical segments through the ICA termini bilaterally.  There is early bifurcation of the left middle cerebral artery, a normal variant.  The A1 and M1 segments are otherwise within normal limits.  The anterior communicating artery is patent.  Mild distal branch vessel disease is evident bilaterally.  A left M2 segment is occluded approximately 15 mm from its origin.  The left vertebral artery is slightly dominant to the right. Prominent AICA vessels are noted bilaterally.  Both posterior cerebral arteries originate from basilar tip.  A prominent left posterior communicating artery is present.  There is mild attenuation of  distal PCA branch vessels.  IMPRESSION:  1.  Mild distal small vessel disease. 2.  Question occlusion of a left M2 segment approximately 15 mm from the bifurcation.  MRA NECK  Findings: There is no significant to flow disturbance at either carotid bifurcation.  Flow is antegrade within the vertebral arteries bilaterally.  There is a common origin of the innominate artery and left common carotid artery.  The vertebral arteries both originate from the subclavian arteries. There is moderate tortuosity in the proximal vertebral arteries. The vessel caliber is maintained.  There is no evidence for dissection or obstruction.  The right common carotid artery is within normal limits.  The carotid bifurcation is unremarkable.  There is mild tortuosity of the cervical right ICA without significant stenosis.  The left common carotid artery is within normal limits.  The bifurcation is unremarkable.  There is mild tortuosity of the proximal left ICA without significant stenosis.  IMPRESSION:  1.  No evidence for vertebral artery dissection or obstruction. 2.  Normal appearance of the carotid bifurcations bilaterally. 3.  Tortuosity of the internal carotid arteries bilaterally may be related to hypertension.  There is no associated stenosis.   Original Report Authenticated By: Marin Roberts, M.D.   Dg Shoulder Left  08/02/2012   *RADIOLOGY REPORT*  Clinical Data: Left shoulder pain.  Status post fall yesterday.  LEFT SHOULDER - 2+ VIEW  Comparison: None.  Findings: No fracture or dislocation.  There are minor AC joint osteoarthritic changes.  The glenohumeral joint is well preserved. No bone lesion.  The underlying ribs intact.  IMPRESSION: No fracture or dislocation or acute finding.   Original Report Authenticated By: Amie Portland, M.D.    Microbiology: Recent Results (from the past 240 hour(s))  URINE CULTURE     Status: None   Collection Time    08/01/12  2:01 PM  Result Value Range Status   Specimen  Description URINE, CATHETERIZED   Final   Special Requests NONE   Final   Culture  Setup Time 08/01/2012 22:41   Final   Colony Count NO GROWTH   Final   Culture NO GROWTH   Final   Report Status 08/02/2012 FINAL   Final     Labs: Basic Metabolic Panel:  Recent Labs Lab 08/01/12 1410  NA 136  K 3.7  CL 98  CO2 28  GLUCOSE 102*  BUN 18  CREATININE 0.97  CALCIUM 9.7   Liver Function Tests:  Recent Labs Lab 08/01/12 1410  AST 33  ALT 29  ALKPHOS 76  BILITOT 0.6  PROT 7.8  ALBUMIN 3.8    Recent Labs Lab 08/01/12 1410  LIPASE 18   No results found for this basename: AMMONIA,  in the last 168 hours CBC:  Recent Labs Lab 08/01/12 1410  WBC 14.2*  NEUTROABS 12.2*  HGB 13.4  HCT 39.5  MCV 91.2  PLT 297   Cardiac Enzymes: No results found for this basename: CKTOTAL, CKMB, CKMBINDEX, TROPONINI,  in the last 168 hours BNP: BNP (last 3 results) No results found for this basename: PROBNP,  in the last 8760 hours CBG: No results found for this basename: GLUCAP,  in the last 168 hours     Signed:  Marinda Elk  [facility] 08/04/2012, 7:49 AM

## 2012-08-04 NOTE — Progress Notes (Addendum)
Clinical Social Work Department CLINICAL SOCIAL WORK PLACEMENT NOTE 08/04/2012  Patient:  TARANEH, METHENEY  Account Number:  [id number] Admit date:  08/01/2012  Clinical Social Worker:  Unk Lightning, LCSW  Date/time:  08/04/2012 10:30 AM  Clinical Social Work is seeking post-discharge placement for this patient at the following level of care:   SKILLED NURSING   (*CSW will update this form in Epic as items are completed)   08/04/2012  Patient/family provided with [facility] Department of Clinical Social Work's list of facilities offering this level of care within the geographic area requested by the patient (or if unable, by the patient's family).  08/04/2012  Patient/family informed of their freedom to choose among providers that offer the needed level of care, that participate in Medicare, Medicaid or managed care program needed by the patient, have an available bed and are willing to accept the patient.  08/04/2012  Patient/family informed of [facility]' ownership interest in [facility], as well as of the fact that they are under no obligation to receive care at this facility.  PASARR submitted to EDS on 08/04/2012 PASARR number received from EDS on 08/04/2012  FL2 transmitted to all facilities in geographic area requested by pt/family on  08/04/2012 FL2 transmitted to all facilities within larger geographic area on   Patient informed that his/her managed care company has contracts with or will negotiate with  certain facilities, including the following:     Patient/family informed of bed offers received: 08/04/12  Patient chooses bed at [facility] Physician recommends and patient chooses bed at    Patient to be transferred to  [facility] on 08/04/12  Patient to be transferred to facility by [facility]  The following physician request were entered in Epic:   Additional Comments:

## 2012-08-04 NOTE — Evaluation (Signed)
Occupational Therapy Evaluation Patient Details Name: Adriana [name] MRN: [id number] DOB: 12/25/1934 Today's Date: 08/04/2012 Time: 6213-0865 OT Time Calculation (min): 25 min  OT Assessment / Plan / Recommendation Clinical Impression  Pt presents to OT with decreased I with ADL activity s/p fall in which pt hit her head  and now has cervical fracture. Pt will benefit from skilled OT to increase I with ADL activity and return to PLOF    OT Assessment  Patient needs continued OT Services    Follow Up Recommendations  SNF       Equipment Recommendations  None recommended by OT    Recommendations for Other Services    Frequency  Min 2X/week    Precautions / Restrictions Precautions Precautions: Fall Required Braces or Orthoses: Cervical Brace Cervical Brace: Applied in supine position;Hard collar (Aspen) Other Brace/Splint: RN to call and have Aspen collar fit checked; PT adjusted but pt still quite mobile in this collar       ADL  Eating/Feeding: Performed;Set up Where Assessed - Eating/Feeding: Chair Upper Body Dressing: Simulated;Minimal assistance Where Assessed - Upper Body Dressing: Supported sit to stand Lower Body Dressing: Simulated;Moderate assistance Where Assessed - Lower Body Dressing: Supported sit to stand Toilet Transfer: Mining engineer Method: Sit to stand;Stand pivot Acupuncturist: Bedside commode Transfers/Ambulation Related to ADLs: Pt reports many falls.  Pt wants to go home but family wants SNF.  Feel pt could benefit from SNF    OT Diagnosis: Generalized weakness  OT Problem List: Decreased strength;Decreased activity tolerance;Decreased safety awareness OT Treatment Interventions: Self-care/ADL training;Patient/family education   OT Goals Acute Rehab OT Goals OT Goal Formulation: With patient Time For Goal Achievement: 08/18/12 ADL Goals Pt Will Perform Grooming: with supervision;Standing at sink ADL Goal:  Grooming - Progress: Goal set today Pt Will Perform Upper Body Dressing: with supervision;Sit to stand from chair ADL Goal: Upper Body Dressing - Progress: Goal set today Pt Will Perform Lower Body Dressing: with supervision;Sit to stand from chair ADL Goal: Lower Body Dressing - Progress: Goal set today Pt Will Transfer to Toilet: with supervision ADL Goal: Toilet Transfer - Progress: Goal set today  Visit Information  Last OT Received On: 08/04/12 Assistance Needed: +1    Subjective Data  Subjective: Thanks for helping me   Prior Functioning     Home Living Lives With: Spouse Available Help at Discharge: Skilled Nursing Facility (??) Type of Home: House Home Access: Stairs to enter [employer] of Steps: 3 Entrance Stairs-Rails: Right Home Layout: One level Bathroom Shower/Tub: Tub/shower unit;Walk-in shower Bathroom Toilet: Standard Home Adaptive Equipment: Straight cane Additional Comments: hx of falls, at least 1 per month; falls usually happen in daytime, doing activities; more fequent recently Prior Function Level of Independence: Independent Able to Take Stairs?: Yes Communication Communication: No difficulties Dominant Hand: Right         Vision/Perception Vision - History Patient Visual Report: No change from baseline   Cognition  Cognition Arousal/Alertness: Awake/alert Behavior During Therapy: WFL for tasks assessed/performed Overall Cognitive Status: Within Functional Limits for tasks assessed    Extremity/Trunk Assessment Right Upper Extremity Assessment RUE ROM/Strength/Tone: Unable to fully assess;Due to precautions Left Upper Extremity Assessment LUE ROM/Strength/Tone: Unable to fully assess;Due to precautions Right Lower Extremity Assessment RLE ROM/Strength/Tone: Deficits RLE ROM/Strength/Tone Deficits: Did not test strength of BUE due to cervical fracture. Pt able to feed self and lift BUe against gravity Left Lower Extremity  Assessment LLE ROM/Strength/Tone: Deficits LLE ROM/Strength/Tone Deficits: same  Mobility Bed Mobility Bed Mobility: Rolling Right;Right Sidelying to Sit Rolling Right: 4: Min assist;4: Min guard Right Sidelying to Sit: HOB elevated;4: Min assist Transfers Sit to Stand: 4: Min assist;From bed Stand to Sit: 4: Min assist;To chair/3-in-1 Details for Transfer Assistance: min for initial balance;         Balance Dynamic Standing Balance Dynamic Standing - Balance Support: During functional activity;Bilateral upper extremity supported Dynamic Standing - Level of Assistance: 4: Min assist High Level Balance High Level Balance Activites: Direction changes;Turns High Level Balance Comments: unilateral steps   End of Session OT - End of Session Equipment Utilized During Treatment: Cervical collar Activity Tolerance: Patient limited by fatigue Patient left: in bed;with family/visitor present  GO     Amro Winebarger, Metro Kung 08/04/2012, 1:49 PM

## 2012-08-05 ENCOUNTER — Non-Acute Institutional Stay (SKILLED_NURSING_FACILITY): Payer: Medicare Other | Admitting: Adult Health

## 2012-08-05 ENCOUNTER — Other Ambulatory Visit: Payer: Self-pay | Admitting: *Deleted

## 2012-08-05 DIAGNOSIS — S12600D Unspecified displaced fracture of seventh cervical vertebra, subsequent encounter for fracture with routine healing: Secondary | ICD-10-CM

## 2012-08-05 DIAGNOSIS — I10 Essential (primary) hypertension: Secondary | ICD-10-CM

## 2012-08-05 DIAGNOSIS — S12500D Unspecified displaced fracture of sixth cervical vertebra, subsequent encounter for fracture with routine healing: Secondary | ICD-10-CM

## 2012-08-05 DIAGNOSIS — M810 Age-related osteoporosis without current pathological fracture: Secondary | ICD-10-CM

## 2012-08-05 DIAGNOSIS — IMO0002 Reserved for concepts with insufficient information to code with codable children: Secondary | ICD-10-CM

## 2012-08-05 DIAGNOSIS — R52 Pain, unspecified: Secondary | ICD-10-CM

## 2012-08-05 DIAGNOSIS — F329 Major depressive disorder, single episode, unspecified: Secondary | ICD-10-CM

## 2012-08-05 DIAGNOSIS — K5732 Diverticulitis of large intestine without perforation or abscess without bleeding: Secondary | ICD-10-CM

## 2012-08-05 MED ORDER — OXYCODONE HCL 5 MG PO TABS: ORAL_TABLET | ORAL | Status: DC

## 2012-08-14 ENCOUNTER — Non-Acute Institutional Stay (SKILLED_NURSING_FACILITY): Payer: Medicare Other | Admitting: Internal Medicine

## 2012-08-14 DIAGNOSIS — I10 Essential (primary) hypertension: Secondary | ICD-10-CM

## 2012-08-14 DIAGNOSIS — IMO0002 Reserved for concepts with insufficient information to code with codable children: Secondary | ICD-10-CM

## 2012-08-14 DIAGNOSIS — K5732 Diverticulitis of large intestine without perforation or abscess without bleeding: Secondary | ICD-10-CM

## 2012-08-14 DIAGNOSIS — M810 Age-related osteoporosis without current pathological fracture: Secondary | ICD-10-CM

## 2012-08-14 DIAGNOSIS — S12500S Unspecified displaced fracture of sixth cervical vertebra, sequela: Secondary | ICD-10-CM

## 2012-08-28 ENCOUNTER — Non-Acute Institutional Stay (SKILLED_NURSING_FACILITY): Payer: Medicare Other | Admitting: Adult Health

## 2012-08-28 DIAGNOSIS — S12500D Unspecified displaced fracture of sixth cervical vertebra, subsequent encounter for fracture with routine healing: Secondary | ICD-10-CM

## 2012-08-28 DIAGNOSIS — F329 Major depressive disorder, single episode, unspecified: Secondary | ICD-10-CM

## 2012-08-28 DIAGNOSIS — F32A Depression, unspecified: Secondary | ICD-10-CM

## 2012-08-28 DIAGNOSIS — K5732 Diverticulitis of large intestine without perforation or abscess without bleeding: Secondary | ICD-10-CM

## 2012-08-28 DIAGNOSIS — I10 Essential (primary) hypertension: Secondary | ICD-10-CM

## 2012-08-28 DIAGNOSIS — IMO0002 Reserved for concepts with insufficient information to code with codable children: Secondary | ICD-10-CM

## 2012-08-28 DIAGNOSIS — S12600D Unspecified displaced fracture of seventh cervical vertebra, subsequent encounter for fracture with routine healing: Secondary | ICD-10-CM

## 2012-08-28 DIAGNOSIS — M810 Age-related osteoporosis without current pathological fracture: Secondary | ICD-10-CM

## 2012-08-28 DIAGNOSIS — F3289 Other specified depressive episodes: Secondary | ICD-10-CM

## 2012-09-01 ENCOUNTER — Encounter: Payer: Self-pay | Admitting: Adult Health

## 2012-09-01 DIAGNOSIS — F329 Major depressive disorder, single episode, unspecified: Secondary | ICD-10-CM | POA: Insufficient documentation

## 2012-09-01 DIAGNOSIS — F418 Other specified anxiety disorders: Secondary | ICD-10-CM | POA: Insufficient documentation

## 2012-09-01 DIAGNOSIS — M810 Age-related osteoporosis without current pathological fracture: Secondary | ICD-10-CM | POA: Insufficient documentation

## 2012-09-01 DIAGNOSIS — I10 Essential (primary) hypertension: Secondary | ICD-10-CM | POA: Insufficient documentation

## 2012-09-01 NOTE — Progress Notes (Signed)
  Subjective:    Patient ID: Adriana [name], female    DOB: 1934/04/12, 78 y.o.   MRN: [id number]  HPI This is a 78 year old female who has been admitted to [facility] on 08/04/12 from [facility] She fell at a clinic and sustained a C6 and C7 fracture. CT of abdomen and pelvis showed patient has diverticulitis of the sigmoid colon. She complains of severe pain on her neck area - 7/10 pain. She has been admitted for a short-term rehabilitation.   Review of Systems  Constitutional: Negative.   HENT: Positive for neck pain.   Eyes: Negative.   Respiratory: Negative for cough and shortness of breath.   Cardiovascular: Negative for leg swelling.  Gastrointestinal: Negative for abdominal pain and abdominal distention.  Endocrine: Negative.   Genitourinary: Negative.   Neurological: Negative.   Hematological: Negative for adenopathy. Does not bruise/bleed easily.  Psychiatric/Behavioral: Negative.        Objective:   Physical Exam  Nursing note and vitals reviewed. Constitutional: She is oriented to person, place, and time. She appears well-developed and well-nourished.  HENT:  Head: Normocephalic and atraumatic.  Right Ear: External ear normal.  Left Ear: External ear normal.  Nose: Nose normal.  Mouth/Throat: Oropharynx is clear and moist.  Eyes: Conjunctivae and EOM are normal. Pupils are equal, round, and reactive to light.  Neck: Normal range of motion. Neck supple. No tracheal deviation present. No thyromegaly present.  Cardiovascular: Normal rate, regular rhythm, normal heart sounds and intact distal pulses.   Pulmonary/Chest: Effort normal and breath sounds normal.  Abdominal: Soft. Bowel sounds are normal.  Musculoskeletal: Normal range of motion. She exhibits no edema and no tenderness.  Neurological: She is alert and oriented to person, place, and time.  Skin: Skin is warm and dry.  Psychiatric: She has a normal mood and affect. Her behavior is normal. Judgment and  thought content normal.    LABS: 08/01/12  NA 136  K 3.7  Glucose 102  BUN 18  Creatinine 0.97  Calcium 9.7  Wbc 14.2 hgb 13.4   hct 39.5     Medications reviewed per [facility]   Assessment & Plan:   Depression - stable  Osteoporosis - stable  Essential hypertension, benign - stable  C6 cervical fracture - continue Aspen collar; for PT/OT  C7 cervical fracture - continue Aspen collar; for PT/OT  Diverticulitis of colon - continue Flagyl and Cipro  Pain - continue Oxycodone 5 mg 1 tab PO Q 6AM and 2 PM; increase Oxycodone 10 mg PO Q 10 PM

## 2012-09-01 NOTE — Progress Notes (Signed)
  Subjective:    Patient ID: Adriana [name], female    DOB: 1934/09/25, 78 y.o.   MRN: [id number]  HPI  This is a 78 year old female who is for discharge home with Home health PT, OT and Nursing. DME: Light weight  With arms and cushions rolling walker and 3-in-1 commode due to unsteady gait. She has completed SNF rehabilitation and therapy has cleared the patient for discharge. She has been admitted to [facility] on 08/04/12 from [facility]. She fell at a clinic and sustained a C6 and C7 fracture. CT of abdomen and pelvis showed patient has diverticulitis of the sigmoid colon and is now done with antibiotic treatment..   Review of Systems  Constitutional: Negative.   HENT: Negative for neck pain.   Eyes: Negative.   Respiratory: Negative for cough and shortness of breath.   Cardiovascular: Negative for leg swelling.  Gastrointestinal: Negative for abdominal pain and abdominal distention.  Endocrine: Negative.   Genitourinary: Negative.   Neurological: Negative.   Hematological: Negative for adenopathy. Does not bruise/bleed easily.  Psychiatric/Behavioral: Negative.        Objective:   Physical Exam  Nursing note and vitals reviewed. Constitutional: She is oriented to person, place, and time. She appears well-developed and well-nourished.  HENT:  Head: Normocephalic and atraumatic.  Right Ear: External ear normal.  Left Ear: External ear normal.  Nose: Nose normal.  Mouth/Throat: Oropharynx is clear and moist.  Eyes: Conjunctivae and EOM are normal. Pupils are equal, round, and reactive to light.  Neck: Normal range of motion. Neck supple. No tracheal deviation present. No thyromegaly present.  Has Aspen collar  Cardiovascular: Normal rate, regular rhythm, normal heart sounds and intact distal pulses.   Pulmonary/Chest: Effort normal and breath sounds normal.  Abdominal: Soft. Bowel sounds are normal.  Musculoskeletal: Normal range of motion. She exhibits no edema and no  tenderness.  Neurological: She is alert and oriented to person, place, and time.  Skin: Skin is warm and dry.  Psychiatric: She has a normal mood and affect. Her behavior is normal. Judgment and thought content normal.    LABS: 08/15/12  Wbc 8.1  hgb 13.8  hct 40.1  NA 141  K 3.9  Glucose 88  BUN 12  Creatinine 0.83 08/01/12  NA 136  K 3.7  Glucose 102  BUN 18  Creatinine 0.97  Calcium 9.7  Wbc 14.2 hgb 13.4   hct 39.5     Medications reviewed per [facility]   Assessment & Plan:   Depression - stable  Osteoporosis - stable  Essential hypertension, benign - stable  C6 cervical fracture - continue Aspen collar; for Home health  PT/OT/Nursing  C7 cervical fracture - continue Aspen collar; for Home health PT/OT/Nursing  Diverticulitis of colon - Resolved   Total discharge time: >30 minutes Discharge time involved coordination of the discharge process with Child psychotherapist, nursing staff and therapy department. Medical justification for Home health services/DME verified.    CPT CODE:   [phone]

## 2012-09-04 DIAGNOSIS — M810 Age-related osteoporosis without current pathological fracture: Secondary | ICD-10-CM | POA: Insufficient documentation

## 2012-09-04 NOTE — Progress Notes (Signed)
Patient ID: Adriana [name], female   DOB: 1935/01/03, 78 y.o.   MRN: [id number]        HISTORY & PHYSICAL  DATE: 08/14/2012   FACILITY: [facility]  LEVEL OF CARE: SNF (31)  ALLERGIES:  Allergies  Allergen Reactions  . Codeine   . Contrast Media (Iodinated Diagnostic Agents)   . Darvocet (Propoxyphene-Acetaminophen) Diarrhea and Nausea And Vomiting  . Erythrocin   . Iodine     CHIEF COMPLAINT:  Manage C6-7 fracture, colonic diverticulitis, and hypertension.    HISTORY OF PRESENT ILLNESS:  The patient is a 78 year-old, Caucasian female.    C6-7 FRACTURE:  Patient had a fall and sustained a cervical fracture.  Neurosurgery recommended wearing an Aspen neck collar and outpatient follow-up.  She is admitted to this facility for short-term rehabilitation.    COLONIC DIVERTICULITIS:  Patient was having abdominal pain, nausea and vomiting.  Abdominal and pelvic CT showed diverticulitis of the sigmoid colon.  She is currently on Cipro and Flagyl.  She denies further abdominal pain, nausea or vomiting.    HTN: Pt 's HTN remains stable.  Denies CP, sob, DOE, pedal edema, headaches, dizziness or visual disturbances.  No complications from the medications currently being used.  Last BP :  156/94, 159/94, 150/89.  PAST MEDICAL HISTORY :  Past Medical History  Diagnosis Date  . Anginal pain 08/24/2010    Echo-EF =>55%,LV normal  . Chest pain, unspecified 09/04/2007    Lexiscan-- EF 72%; LV normal  . Osteopenia   . Arthritis     PAST SURGICAL HISTORY: Past Surgical History  Procedure Laterality Date  . Cardiac catheterization  08/01/2010    normal coronaries  . Colonoscopy  approx 2011  . Intestinal blockage surgery  Feb. 2012    "kink in small intestine" per pt    SOCIAL HISTORY:  reports that she has never smoked. She has never used smokeless tobacco. She reports that she drinks about 1.8 ounces of alcohol per week. She reports that she does not use illicit  drugs.  FAMILY HISTORY:  Family History  Problem Relation Age of Onset  . Hypertension Mother   . Transient ischemic attack Mother   . Diabetes Father   . Coronary artery disease Father   . Prostate cancer Brother     CURRENT MEDICATIONS: Reviewed per [facility]  REVIEW OF SYSTEMS:  See HPI otherwise 14 point ROS is negative.  PHYSICAL EXAMINATION  VS:  T 97.9       P 82      RR 20      BP 156/94      POX 98% room air        WT (Lb)  GENERAL: no acute distress, moderately obese body habitus SKIN: warm & dry, no suspicious lesions or rashes, no excessive dryness EYES: conjunctivae normal, sclerae normal, normal eye lids MOUTH/THROAT: lips without lesions,no lesions in the mouth,tongue is without lesions,uvula elevates in midline NECK: patient has an Biochemist, clinical LYMPHATICS: no LAN in the neck, no supraclavicular LAN RESPIRATORY: breathing is even & unlabored, BS CTAB CARDIAC: RRR, no murmur,no extra heart sounds, no edema GI:  ABDOMEN: abdomen soft, normal BS, no masses, no tenderness  LIVER/SPLEEN: no hepatomegaly, no splenomegaly MUSCULOSKELETAL: HEAD: normal to inspection & palpation BACK: no kyphosis, scoliosis or spinal processes tenderness EXTREMITIES: LEFT UPPER EXTREMITY: strength intact, range of motion moderate  RIGHT UPPER EXTREMITY:  full range of motion, normal strength & tone LEFT LOWER EXTREMITY:  full range of motion, normal strength & tone RIGHT LOWER EXTREMITY:  full range of motion, normal strength & tone PSYCHIATRIC: the patient is alert & oriented to person, affect & behavior appropriate  LABS/RADIOLOGY: CT of the abdomen and pelvis showed acute sigmoid diverticulitis.  No focal abscess.    CT of the head:  No acute findings.    CT of the cervical spine showed fractures of the C6 left pedicle and C6 left laminal as well as left superior fragment.    MRA of the head was negative for any findings.    Urine culture showed no growth.    BMP normal.    Liver profile normal.   Lipase 18.    WBC 14.2, otherwise CBC normal.    ASSESSMENT/PLAN:  C6-7 fracture.  Continue Aspen collar.  Continue rehabilitation and follow up with Neurosurgery.    Colonic diverticulitis.  Continue current antibiotics as prescribed.    Hypertension.  Uncontrolled.  Increase Toprol XL to 50 mg q.d.    Osteoporosis.  Continue current medications.    Osteoarthritis.  Continue current medications.    Depression.  No acute symptoms.  Check CBC and BMP.     I have reviewed patient's medical records received at admission/from hospitalization.  CPT CODE: 04540

## 2012-09-29 ENCOUNTER — Other Ambulatory Visit: Payer: Self-pay | Admitting: Neurosurgery

## 2012-09-29 DIAGNOSIS — M5412 Radiculopathy, cervical region: Secondary | ICD-10-CM

## 2012-10-03 ENCOUNTER — Other Ambulatory Visit: Payer: Medicare Other

## 2012-10-03 ENCOUNTER — Inpatient Hospital Stay: Admission: RE | Admit: 2012-10-03 | Payer: Medicare Other | Source: Ambulatory Visit

## 2012-10-07 ENCOUNTER — Ambulatory Visit
Admission: RE | Admit: 2012-10-07 | Discharge: 2012-10-07 | Disposition: A | Discharge status: Home / Self Care | Payer: PRIVATE HEALTH INSURANCE | Source: Ambulatory Visit | Attending: Neurosurgery | Admitting: Neurosurgery

## 2012-10-07 DIAGNOSIS — M5412 Radiculopathy, cervical region: Secondary | ICD-10-CM

## 2012-10-13 ENCOUNTER — Other Ambulatory Visit: Payer: Self-pay | Admitting: Neurosurgery

## 2012-10-13 ENCOUNTER — Encounter (HOSPITAL_COMMUNITY): Payer: Self-pay | Admitting: Pharmacy Technician

## 2012-10-21 ENCOUNTER — Encounter (HOSPITAL_COMMUNITY)
Admission: RE | Admit: 2012-10-21 | Discharge: 2012-10-21 | Disposition: A | Discharge status: Home / Self Care | Payer: Medicare Other | Source: Ambulatory Visit | Attending: Neurosurgery | Admitting: Neurosurgery

## 2012-10-21 ENCOUNTER — Encounter (HOSPITAL_COMMUNITY): Payer: Self-pay

## 2012-10-21 ENCOUNTER — Ambulatory Visit (HOSPITAL_COMMUNITY)
Admission: RE | Admit: 2012-10-21 | Discharge: 2012-10-21 | Disposition: A | Discharge status: Home / Self Care | Payer: Medicare Other | Source: Ambulatory Visit | Attending: Anesthesiology | Admitting: Anesthesiology

## 2012-10-21 DIAGNOSIS — Z01818 Encounter for other preprocedural examination: Secondary | ICD-10-CM | POA: Insufficient documentation

## 2012-10-21 DIAGNOSIS — Z01812 Encounter for preprocedural laboratory examination: Secondary | ICD-10-CM | POA: Insufficient documentation

## 2012-10-21 HISTORY — DX: Constipation, unspecified: K59.00

## 2012-10-21 HISTORY — DX: Nonrheumatic mitral (valve) prolapse: I34.1

## 2012-10-21 HISTORY — DX: Essential (primary) hypertension: I10

## 2012-10-21 HISTORY — DX: Unspecified injury of head, initial encounter: S09.90XA

## 2012-10-21 HISTORY — DX: Depression, unspecified: F32.A

## 2012-10-21 HISTORY — DX: Major depressive disorder, single episode, unspecified: F32.9

## 2012-10-21 LAB — BASIC METABOLIC PANEL
BUN: 16 mg/dL (ref 6–23)
CO2: 29 mEq/L (ref 19–32)
Calcium: 9.9 mg/dL (ref 8.4–10.5)
Chloride: 105 mEq/L (ref 96–112)
Creatinine, Ser: 0.87 mg/dL (ref 0.50–1.10)
GFR calc Af Amer: 74 mL/min — ABNORMAL LOW (ref >90)
GFR calc non Af Amer: 64 mL/min — ABNORMAL LOW (ref >90)
Glucose, Bld: 87 mg/dL (ref 70–99)
Potassium: 4.5 mEq/L (ref 3.5–5.1)
Sodium: 142 mEq/L (ref 135–145)

## 2012-10-21 LAB — CBC
HCT: 40.2 % (ref 36.0–46.0)
Hemoglobin: 13.8 g/dL (ref 12.0–15.0)
MCH: 31.2 pg (ref 26.0–34.0)
MCHC: 34.3 g/dL (ref 30.0–36.0)
MCV: 91 fL (ref 78.0–100.0)
Platelets: 273 10*3/uL (ref 150–400)
RBC: 4.42 MIL/uL (ref 3.87–5.11)
RDW: 13.6 % (ref 11.5–15.5)
WBC: 8 10*3/uL (ref 4.0–10.5)

## 2012-10-21 LAB — SURGICAL PCR SCREEN
MRSA, PCR: NEGATIVE
Staphylococcus aureus: NEGATIVE

## 2012-10-21 NOTE — Pre-Procedure Instructions (Addendum)
Adriana [name]  10/21/2012   Your procedure is scheduled on:  Thursday, August 21st.  Report to [facility] at 5:30AM.  Call this number if you have problems the morning of surgery: [phone]   Remember:   Do not eat food or drink liquids after midnight.   Take these medicines the morning of surgery with A SIP OF WATER:Bupropion (Wellbutrion XL), Metoprolol (Lopressor), Sertraline (Zoloft).  Take if needed:  Nitroglycerin.  Stop taking Aspirin, Coumadin, Plavix, Effient and Herbal medications.  Do not take any NSAIDs ie: Ibuprofen,  Advil,Naproxen or any medication containing Aspirin.   Do not wear jewelry, make-up or nail polish.  Do not wear lotions, powders, or perfumes. You may wear deodorant.  Do not shave 48 hours prior to surgery.  Do not bring valuables to the hospital.  [facility] is not responsible  for any belongings or valuables.  Contacts, dentures or bridgework may not be worn into surgery.  Leave suitcase in the car. After surgery it may be brought to your room.  For patients admitted to the hospital, checkout time is 11:00 AM the day of discharge.   Patients discharged the day of surgery will not be allowed to drive home.  Name and phone number of your driver: -   Special Instructions: Shower using CHG 2 nights before surgery and the night before surgery.  If you shower the day of surgery use CHG.  Use special wash - you have one bottle of CHG for all showers.  You should use approximately 1/3 of the bottle for each shower.   Please read over the following fact sheets that you were given: Pain Booklet, Coughing and Deep Breathing and Surgical Site Infection Prevention

## 2012-10-22 MED ORDER — CEFAZOLIN SODIUM-DEXTROSE 2-3 GM-% IV SOLR
2.0000 g | INTRAVENOUS | Status: AC
  Administered 2012-10-23: 2 g via INTRAVENOUS
  Filled 2012-10-22: qty 50

## 2012-10-23 ENCOUNTER — Inpatient Hospital Stay (HOSPITAL_COMMUNITY)
Admission: RE | Admit: 2012-10-23 | Discharge: 2012-10-24 | DRG: 473 | Disposition: A | Discharge status: Home / Self Care | Payer: Medicare Other | Source: Ambulatory Visit | Attending: Neurosurgery | Admitting: Neurosurgery

## 2012-10-23 ENCOUNTER — Encounter (HOSPITAL_COMMUNITY)
Admission: RE | Disposition: A | Discharge status: Home / Self Care | Payer: Self-pay | Source: Ambulatory Visit | Attending: Neurosurgery

## 2012-10-23 ENCOUNTER — Observation Stay (HOSPITAL_COMMUNITY): Payer: Medicare Other

## 2012-10-23 ENCOUNTER — Ambulatory Visit (HOSPITAL_COMMUNITY): Payer: Medicare Other | Admitting: Anesthesiology

## 2012-10-23 ENCOUNTER — Encounter (HOSPITAL_COMMUNITY): Payer: Self-pay | Admitting: *Deleted

## 2012-10-23 ENCOUNTER — Encounter (HOSPITAL_COMMUNITY): Payer: Self-pay | Admitting: Anesthesiology

## 2012-10-23 DIAGNOSIS — R112 Nausea with vomiting, unspecified: Secondary | ICD-10-CM | POA: Diagnosis not present

## 2012-10-23 DIAGNOSIS — Z79899 Other long term (current) drug therapy: Secondary | ICD-10-CM

## 2012-10-23 DIAGNOSIS — S12500A Unspecified displaced fracture of sixth cervical vertebra, initial encounter for closed fracture: Principal | ICD-10-CM | POA: Diagnosis present

## 2012-10-23 DIAGNOSIS — Q762 Congenital spondylolisthesis: Secondary | ICD-10-CM

## 2012-10-23 DIAGNOSIS — Z823 Family history of stroke: Secondary | ICD-10-CM

## 2012-10-23 DIAGNOSIS — Z881 Allergy status to other antibiotic agents status: Secondary | ICD-10-CM

## 2012-10-23 DIAGNOSIS — Z833 Family history of diabetes mellitus: Secondary | ICD-10-CM

## 2012-10-23 DIAGNOSIS — F329 Major depressive disorder, single episode, unspecified: Secondary | ICD-10-CM | POA: Diagnosis present

## 2012-10-23 DIAGNOSIS — W19XXXA Unspecified fall, initial encounter: Secondary | ICD-10-CM | POA: Diagnosis present

## 2012-10-23 DIAGNOSIS — F3289 Other specified depressive episodes: Secondary | ICD-10-CM | POA: Diagnosis present

## 2012-10-23 DIAGNOSIS — M47812 Spondylosis without myelopathy or radiculopathy, cervical region: Secondary | ICD-10-CM | POA: Diagnosis present

## 2012-10-23 DIAGNOSIS — Z888 Allergy status to other drugs, medicaments and biological substances status: Secondary | ICD-10-CM

## 2012-10-23 DIAGNOSIS — M810 Age-related osteoporosis without current pathological fracture: Secondary | ICD-10-CM | POA: Diagnosis present

## 2012-10-23 DIAGNOSIS — M5030 Other cervical disc degeneration, unspecified cervical region: Secondary | ICD-10-CM | POA: Diagnosis present

## 2012-10-23 DIAGNOSIS — Z7982 Long term (current) use of aspirin: Secondary | ICD-10-CM

## 2012-10-23 DIAGNOSIS — I10 Essential (primary) hypertension: Secondary | ICD-10-CM | POA: Diagnosis present

## 2012-10-23 DIAGNOSIS — Z8249 Family history of ischemic heart disease and other diseases of the circulatory system: Secondary | ICD-10-CM

## 2012-10-23 DIAGNOSIS — Z91041 Radiographic dye allergy status: Secondary | ICD-10-CM

## 2012-10-23 DIAGNOSIS — S12600A Unspecified displaced fracture of seventh cervical vertebra, initial encounter for closed fracture: Secondary | ICD-10-CM | POA: Diagnosis present

## 2012-10-23 DIAGNOSIS — Z8042 Family history of malignant neoplasm of prostate: Secondary | ICD-10-CM

## 2012-10-23 HISTORY — PX: ANTERIOR CERVICAL DECOMP/DISCECTOMY FUSION: SHX1161

## 2012-10-23 SURGERY — ANTERIOR CERVICAL DECOMPRESSION/DISCECTOMY FUSION 2 LEVELS
Anesthesia: General | Site: Neck | Wound class: Clean

## 2012-10-23 MED ORDER — HYDROMORPHONE HCL PF 1 MG/ML IJ SOLN
INTRAMUSCULAR | Status: AC
  Filled 2012-10-23: qty 1

## 2012-10-23 MED ORDER — ACETAMINOPHEN 650 MG RE SUPP: 650.0000 mg | RECTAL | Status: DC | PRN

## 2012-10-23 MED ORDER — HYDROMORPHONE HCL PF 1 MG/ML IJ SOLN
0.2500 mg | INTRAMUSCULAR | Status: DC | PRN
  Administered 2012-10-23 (×2): 0.5 mg via INTRAVENOUS

## 2012-10-23 MED ORDER — MIDAZOLAM HCL 2 MG/2ML IJ SOLN: 0.5000 mg | Freq: Once | INTRAMUSCULAR | Status: DC | PRN

## 2012-10-23 MED ORDER — MEPERIDINE HCL 25 MG/ML IJ SOLN: 6.2500 mg | INTRAMUSCULAR | Status: DC | PRN

## 2012-10-23 MED ORDER — BISACODYL 10 MG RE SUPP: 10.0000 mg | Freq: Every day | RECTAL | Status: DC | PRN

## 2012-10-23 MED ORDER — THROMBIN 20000 UNITS EX SOLR
CUTANEOUS | Status: DC | PRN
  Administered 2012-10-23: 09:00:00 via TOPICAL

## 2012-10-23 MED ORDER — METOPROLOL TARTRATE 12.5 MG HALF TABLET
12.5000 mg | ORAL_TABLET | Freq: Two times a day (BID) | ORAL | Status: DC
  Administered 2012-10-23: 12.5 mg via ORAL
  Filled 2012-10-23 (×3): qty 1

## 2012-10-23 MED ORDER — LIDOCAINE HCL (CARDIAC) 20 MG/ML IV SOLN
INTRAVENOUS | Status: DC | PRN
  Administered 2012-10-23: 20 mg via INTRAVENOUS

## 2012-10-23 MED ORDER — BUPIVACAINE HCL (PF) 0.25 % IJ SOLN: INTRAMUSCULAR | Status: DC | PRN

## 2012-10-23 MED ORDER — NEOSTIGMINE METHYLSULFATE 1 MG/ML IJ SOLN
INTRAMUSCULAR | Status: DC | PRN
  Administered 2012-10-23: 3 mg via INTRAVENOUS

## 2012-10-23 MED ORDER — DEXTROSE-NACL 5-0.45 % IV SOLN: INTRAVENOUS | Status: DC

## 2012-10-23 MED ORDER — PROMETHAZINE HCL 25 MG/ML IJ SOLN: 6.2500 mg | INTRAMUSCULAR | Status: DC | PRN

## 2012-10-23 MED ORDER — SODIUM CHLORIDE 0.9 % IV SOLN
10.0000 mg | INTRAVENOUS | Status: DC | PRN
  Administered 2012-10-23: 10 ug/min via INTRAVENOUS

## 2012-10-23 MED ORDER — SERTRALINE HCL 50 MG PO TABS
150.0000 mg | ORAL_TABLET | Freq: Every day | ORAL | Status: DC
  Filled 2012-10-23: qty 1

## 2012-10-23 MED ORDER — PROPOFOL 10 MG/ML IV BOLUS
INTRAVENOUS | Status: DC | PRN
  Administered 2012-10-23: 100 mg via INTRAVENOUS

## 2012-10-23 MED ORDER — KETOROLAC TROMETHAMINE 30 MG/ML IJ SOLN: 30.0000 mg | Freq: Once | INTRAMUSCULAR | Status: DC

## 2012-10-23 MED ORDER — PHENOL 1.4 % MT LIQD: 1.0000 | OROMUCOSAL | Status: DC | PRN

## 2012-10-23 MED ORDER — MORPHINE SULFATE 4 MG/ML IJ SOLN: 4.0000 mg | INTRAMUSCULAR | Status: DC | PRN

## 2012-10-23 MED ORDER — ZOLPIDEM TARTRATE 5 MG PO TABS: 5.0000 mg | ORAL_TABLET | Freq: Every evening | ORAL | Status: DC | PRN

## 2012-10-23 MED ORDER — LACTATED RINGERS IV SOLN
INTRAVENOUS | Status: DC | PRN
  Administered 2012-10-23 (×2): via INTRAVENOUS

## 2012-10-23 MED ORDER — SODIUM CHLORIDE 0.9 % IV SOLN: 250.0000 mL | INTRAVENOUS | Status: DC

## 2012-10-23 MED ORDER — OXYCODONE HCL 5 MG PO TABS: 5.0000 mg | ORAL_TABLET | Freq: Once | ORAL | Status: DC | PRN

## 2012-10-23 MED ORDER — LIDOCAINE-EPINEPHRINE 1 %-1:100000 IJ SOLN
INTRAMUSCULAR | Status: DC | PRN
  Administered 2012-10-23: 10 mL

## 2012-10-23 MED ORDER — OXYCODONE HCL 5 MG/5ML PO SOLN: 5.0000 mg | Freq: Once | ORAL | Status: DC | PRN

## 2012-10-23 MED ORDER — HYDROXYZINE HCL 25 MG PO TABS: 50.0000 mg | ORAL_TABLET | ORAL | Status: DC | PRN

## 2012-10-23 MED ORDER — HYDROCODONE-ACETAMINOPHEN 5-325 MG PO TABS
1.0000 | ORAL_TABLET | ORAL | Status: DC | PRN
  Administered 2012-10-23: 2 via ORAL
  Filled 2012-10-23: qty 2

## 2012-10-23 MED ORDER — 0.9 % SODIUM CHLORIDE (POUR BTL) OPTIME
TOPICAL | Status: DC | PRN
  Administered 2012-10-23: 1000 mL

## 2012-10-23 MED ORDER — ACETAMINOPHEN 325 MG PO TABS: 650.0000 mg | ORAL_TABLET | ORAL | Status: DC | PRN

## 2012-10-23 MED ORDER — ALUM & MAG HYDROXIDE-SIMETH 200-200-20 MG/5ML PO SUSP: 30.0000 mL | Freq: Four times a day (QID) | ORAL | Status: DC | PRN

## 2012-10-23 MED ORDER — SODIUM CHLORIDE 0.9 % IJ SOLN
3.0000 mL | Freq: Two times a day (BID) | INTRAMUSCULAR | Status: DC
  Administered 2012-10-23: 3 mL via INTRAVENOUS

## 2012-10-23 MED ORDER — ACETAMINOPHEN 10 MG/ML IV SOLN
INTRAVENOUS | Status: AC
  Administered 2012-10-23: 1000 mg via INTRAVENOUS
  Filled 2012-10-23: qty 100

## 2012-10-23 MED ORDER — GLYCOPYRROLATE 0.2 MG/ML IJ SOLN
INTRAMUSCULAR | Status: DC | PRN
  Administered 2012-10-23: 0.4 mg via INTRAVENOUS

## 2012-10-23 MED ORDER — MAGNESIUM HYDROXIDE 400 MG/5ML PO SUSP: 30.0000 mL | Freq: Every day | ORAL | Status: DC | PRN

## 2012-10-23 MED ORDER — NITROGLYCERIN 0.4 MG SL SUBL: 0.4000 mg | SUBLINGUAL_TABLET | SUBLINGUAL | Status: DC | PRN

## 2012-10-23 MED ORDER — BUPROPION HCL ER (XL) 300 MG PO TB24
450.0000 mg | ORAL_TABLET | Freq: Every day | ORAL | Status: DC
  Filled 2012-10-23: qty 1

## 2012-10-23 MED ORDER — KETOROLAC TROMETHAMINE 30 MG/ML IJ SOLN
30.0000 mg | Freq: Four times a day (QID) | INTRAMUSCULAR | Status: DC
  Administered 2012-10-23 – 2012-10-24 (×3): 30 mg via INTRAVENOUS
  Filled 2012-10-23 (×7): qty 1

## 2012-10-23 MED ORDER — ONDANSETRON HCL 4 MG/2ML IJ SOLN
INTRAMUSCULAR | Status: DC | PRN
  Administered 2012-10-23: 4 mg via INTRAVENOUS

## 2012-10-23 MED ORDER — ONDANSETRON HCL 4 MG/2ML IJ SOLN: 4.0000 mg | Freq: Four times a day (QID) | INTRAMUSCULAR | Status: DC | PRN

## 2012-10-23 MED ORDER — DEXAMETHASONE SODIUM PHOSPHATE 4 MG/ML IJ SOLN
8.0000 mg | Freq: Once | INTRAMUSCULAR | Status: AC
  Administered 2012-10-23: 8 mg via INTRAVENOUS

## 2012-10-23 MED ORDER — BUPIVACAINE HCL 0.5 % IJ SOLN
INTRAMUSCULAR | Status: DC | PRN
  Administered 2012-10-23: 10 mL

## 2012-10-23 MED ORDER — PHENYLEPHRINE HCL 10 MG/ML IJ SOLN
INTRAMUSCULAR | Status: DC | PRN
  Administered 2012-10-23 (×3): 80 ug via INTRAVENOUS
  Administered 2012-10-23: 120 ug via INTRAVENOUS

## 2012-10-23 MED ORDER — SODIUM CHLORIDE 0.9 % IJ SOLN: 3.0000 mL | INTRAMUSCULAR | Status: DC | PRN

## 2012-10-23 MED ORDER — MENTHOL 3 MG MT LOZG
1.0000 | LOZENGE | OROMUCOSAL | Status: DC | PRN
  Filled 2012-10-23: qty 9

## 2012-10-23 MED ORDER — SODIUM CHLORIDE 0.9 % IR SOLN
Status: DC | PRN
  Administered 2012-10-23: 09:00:00

## 2012-10-23 MED ORDER — ROCURONIUM BROMIDE 100 MG/10ML IV SOLN
INTRAVENOUS | Status: DC | PRN
  Administered 2012-10-23 (×2): 10 mg via INTRAVENOUS
  Administered 2012-10-23: 50 mg via INTRAVENOUS

## 2012-10-23 MED ORDER — FENTANYL CITRATE 0.05 MG/ML IJ SOLN
INTRAMUSCULAR | Status: DC | PRN
  Administered 2012-10-23: 250 ug via INTRAVENOUS

## 2012-10-23 MED ORDER — CYCLOBENZAPRINE HCL 10 MG PO TABS
10.0000 mg | ORAL_TABLET | Freq: Three times a day (TID) | ORAL | Status: DC | PRN
  Administered 2012-10-23: 10 mg via ORAL
  Filled 2012-10-23: qty 1

## 2012-10-23 MED ORDER — THROMBIN 5000 UNITS EX SOLR
OROMUCOSAL | Status: DC | PRN
  Administered 2012-10-23: 10:00:00 via TOPICAL

## 2012-10-23 SURGICAL SUPPLY — 55 items
ALLOGRAFT 7X14X11 (Bone Implant) ×4 IMPLANT
BAG DECANTER FOR FLEXI CONT (MISCELLANEOUS) ×2 IMPLANT
BIT DRILL NEURO 2X3.1 SFT TUCH (MISCELLANEOUS) ×1 IMPLANT
BLADE ULTRA TIP 2M (BLADE) ×2 IMPLANT
BRUSH SCRUB EZ PLAIN DRY (MISCELLANEOUS) ×2 IMPLANT
CANISTER SUCTION 2500CC (MISCELLANEOUS) ×2 IMPLANT
CLOTH BEACON ORANGE TIMEOUT ST (SAFETY) ×2 IMPLANT
CONT SPEC 4OZ CLIKSEAL STRL BL (MISCELLANEOUS) ×2 IMPLANT
COVER MAYO STAND STRL (DRAPES) ×2 IMPLANT
DECANTER SPIKE VIAL GLASS SM (MISCELLANEOUS) ×2 IMPLANT
DERMABOND ADHESIVE PROPEN (GAUZE/BANDAGES/DRESSINGS) ×1
DERMABOND ADVANCED (GAUZE/BANDAGES/DRESSINGS) ×1
DERMABOND ADVANCED .7 DNX12 (GAUZE/BANDAGES/DRESSINGS) ×1 IMPLANT
DERMABOND ADVANCED .7 DNX6 (GAUZE/BANDAGES/DRESSINGS) ×1 IMPLANT
DRAPE LAPAROTOMY 100X72 PEDS (DRAPES) ×2 IMPLANT
DRAPE MICROSCOPE LEICA (MISCELLANEOUS) ×2 IMPLANT
DRAPE POUCH INSTRU U-SHP 10X18 (DRAPES) ×2 IMPLANT
DRAPE PROXIMA HALF (DRAPES) IMPLANT
DRILL NEURO 2X3.1 SOFT TOUCH (MISCELLANEOUS) ×2
ELECT COATED BLADE 2.86 ST (ELECTRODE) ×2 IMPLANT
ELECT REM PT RETURN 9FT ADLT (ELECTROSURGICAL) ×2
ELECTRODE REM PT RTRN 9FT ADLT (ELECTROSURGICAL) ×1 IMPLANT
GLOVE BIOGEL PI IND STRL 8 (GLOVE) ×1 IMPLANT
GLOVE BIOGEL PI INDICATOR 8 (GLOVE) ×1
GLOVE ECLIPSE 7.5 STRL STRAW (GLOVE) ×4 IMPLANT
GLOVE EXAM NITRILE LRG STRL (GLOVE) ×2 IMPLANT
GLOVE EXAM NITRILE MD LF STRL (GLOVE) IMPLANT
GLOVE EXAM NITRILE XL STR (GLOVE) IMPLANT
GLOVE EXAM NITRILE XS STR PU (GLOVE) IMPLANT
GLOVE INDICATOR 8.5 STRL (GLOVE) ×4 IMPLANT
GLOVE SURG SS PI 8.0 STRL IVOR (GLOVE) ×6 IMPLANT
GOWN BRE IMP SLV AUR LG STRL (GOWN DISPOSABLE) ×2 IMPLANT
GOWN BRE IMP SLV AUR XL STRL (GOWN DISPOSABLE) ×2 IMPLANT
GOWN STRL REIN 2XL LVL4 (GOWN DISPOSABLE) ×4 IMPLANT
HEAD HALTER (SOFTGOODS) ×2 IMPLANT
KIT BASIN OR (CUSTOM PROCEDURE TRAY) ×2 IMPLANT
KIT ROOM TURNOVER OR (KITS) ×2 IMPLANT
NEEDLE HYPO 25X1 1.5 SAFETY (NEEDLE) ×2 IMPLANT
NEEDLE SPNL 22GX3.5 QUINCKE BK (NEEDLE) ×4 IMPLANT
NS IRRIG 1000ML POUR BTL (IV SOLUTION) ×2 IMPLANT
PACK LAMINECTOMY NEURO (CUSTOM PROCEDURE TRAY) ×2 IMPLANT
PAD ARMBOARD 7.5X6 YLW CONV (MISCELLANEOUS) ×6 IMPLANT
PLATE CERVICAL 35MM (Plate) ×2 IMPLANT
RUBBERBAND STERILE (MISCELLANEOUS) ×4 IMPLANT
SCREW FIX 4.0X14MM (Screw) ×8 IMPLANT
SCREW VAR 4.0X14MM (Screw) ×8 IMPLANT
SPONGE INTESTINAL PEANUT (DISPOSABLE) ×2 IMPLANT
SPONGE SURGIFOAM ABS GEL 100 (HEMOSTASIS) ×2 IMPLANT
STAPLER SKIN PROX WIDE 3.9 (STAPLE) IMPLANT
SUT VIC AB 2-0 CP2 18 (SUTURE) ×2 IMPLANT
SUT VIC AB 3-0 SH 8-18 (SUTURE) ×2 IMPLANT
SYR 20ML ECCENTRIC (SYRINGE) ×2 IMPLANT
TOWEL OR 17X24 6PK STRL BLUE (TOWEL DISPOSABLE) ×2 IMPLANT
TOWEL OR 17X26 10 PK STRL BLUE (TOWEL DISPOSABLE) ×2 IMPLANT
WATER STERILE IRR 1000ML POUR (IV SOLUTION) ×2 IMPLANT

## 2012-10-23 NOTE — Anesthesia Procedure Notes (Signed)
Procedure Name: Intubation Date/Time: 10/23/2012 7:39 AM Performed by: Quentin Ore Pre-anesthesia Checklist: Patient identified, Emergency Drugs available, Suction available, Patient being monitored and Timeout performed Patient Re-evaluated:Patient Re-evaluated prior to inductionOxygen Delivery Method: Circle system utilized Preoxygenation: Pre-oxygenation with 100% oxygen Intubation Type: IV induction Ventilation: Mask ventilation without difficulty Grade View: Grade II Tube type: Oral Tube size: 7.0 mm Number of attempts: 1 Airway Equipment and Method: Stylet and Video-laryngoscopy Placement Confirmation: ETT inserted through vocal cords under direct vision,  positive ETCO2 and breath sounds checked- equal and bilateral Secured at: 21 cm Tube secured with: Tape Dental Injury: Teeth and Oropharynx as per pre-operative assessment  Comments: Cervical stabilzation maintained for intubation. AOI with glidescope. +ETCO2 and BBS=.

## 2012-10-23 NOTE — Progress Notes (Signed)
UR COMPLETED  

## 2012-10-23 NOTE — Preoperative (Signed)
Beta Blockers   Reason not to administer Beta Blockers:Not Applicable 

## 2012-10-23 NOTE — Transfer of Care (Signed)
Immediate Anesthesia Transfer of Care Note  Patient: Adriana [name]  Procedure(s) Performed: Procedure(s) with comments: Cervical Five to Cervical Six, Cervical Six to Cervical Seven anterior cervical decompression with fusion plating and bonegraft (N/A) - ANTERIOR CERVICAL DECOMPRESSION/DISCECTOMY FUSION 2 LEVELS  Patient Location: PACU  Anesthesia Type:General  Level of Consciousness: awake, alert  and oriented  Airway & Oxygen Therapy: Patient Spontanous Breathing and Patient connected to nasal cannula oxygen  Post-op Assessment: Report given to PACU RN, Post -op Vital signs reviewed and stable and Patient moving all extremities X 4  Post vital signs: Reviewed and stable  Complications: No apparent anesthesia complications

## 2012-10-23 NOTE — Anesthesia Postprocedure Evaluation (Signed)
  Anesthesia Post-op Note  Patient: Carrington Mullenax Howeth  Procedure(s) Performed: Procedure(s) with comments: Cervical Five to Cervical Six, Cervical Six to Cervical Seven anterior cervical decompression with fusion plating and bonegraft (N/A) - ANTERIOR CERVICAL DECOMPRESSION/DISCECTOMY FUSION 2 LEVELS  Patient Location: PACU  Anesthesia Type:General  Level of Consciousness: awake, alert , oriented and patient cooperative  Airway and Oxygen Therapy: Patient Spontanous Breathing and Patient connected to nasal cannula oxygen  Post-op Pain: mild  Post-op Assessment: Post-op Vital signs reviewed, Patient's Cardiovascular Status Stable, Respiratory Function Stable, Patent Airway, No signs of Nausea or vomiting and Pain level controlled  Post-op Vital Signs: Reviewed and stable  Complications: No apparent anesthesia complications

## 2012-10-23 NOTE — H&P (Signed)
Subjective: Patient is a 78 y.o. right handed white female who is admitted for treatment of fractures of C6 and C7, dislocation of C6 on C7, and multilevel degenerative disc disease and spondylosis.  Patient suffered injuries in a fall on 08/01/2012 evaluation by the emergency room revealed fractures of C6 and C7, and initial neurologic assessments revealed intact neurologic function. Patient was immobilized in a Aspen cervical collar and underwent subsequent rehabilitation.   The patient subsequently presented for neurosurgical evaluation about a month and a half following her fall.  Patient denies neck pain, and says that she has never had any neck pain. She has had some discomfort in the left shoulder, as well as weakness in the left upper extremity. Examination revealed weakness of the left triceps and review of initial CT scan showed evidence of cervical degenerative disc disease and cervical spondylosis at C5-6 worse than at C6-7. There is an anterolisthesis of C6 on C7. Repeat CT scan showed nonhealing of the fractures at C6 and C7, and increase in the anterolisthesis of C6 on C7, with interval development of vacuum disc phenomenon at the C6-7 disc level.  Patient is admitted now for a 2 level C5-6 and C6-7 anterior cervical decompression and arthrodesis with allograft and cervical plating.   Patient Active Problem List   Diagnosis Date Noted  . Osteoporosis, unspecified 09/04/2012  . Depression 09/01/2012  . Osteoporosis 09/01/2012  . Essential hypertension, benign 09/01/2012  . C6 cervical fracture 08/01/2012  . C7 cervical fracture 08/01/2012  . Diverticulitis of colon 08/01/2012   Past Medical History  Diagnosis Date  . Anginal pain 08/24/2010    Echo-EF =>55%,LV normal  . Chest pain, unspecified 09/04/2007    Lexiscan-- EF 72%; LV normal  . Osteopenia   . Arthritis   . Mitral valve prolapse   . Hypertension   . Depression   . Head injury, closed     with fall  .  Constipation     Takes flax seed and honey .  Smooth Move tea.    Past Surgical History  Procedure Laterality Date  . Cardiac catheterization  08/01/2010    normal coronaries  . Colonoscopy  approx 2011  . Intestinal blockage surgery  Feb. 2012    "kink in small intestine" per pt  . Eye surgery Bilateral 2011    Prescriptions prior to admission  Medication Sig Dispense Refill  . alendronate (FOSAMAX) 70 MG tablet Take 70 mg by mouth every 7 (seven) days. Take with a full glass of water on an empty stomach.  Taken on Sundays.      Marland Kitchen aspirin EC 81 MG tablet Take 81 mg by mouth daily.      Marland Kitchen buPROPion (WELLBUTRIN XL) 150 MG 24 hr tablet Take 450 mg by mouth daily.      . calcium-vitamin D (OSCAL WITH D) 500-200 MG-UNIT per tablet Take 1 tablet by mouth 2 (two) times daily.      Marland Kitchen glucosamine-chondroitin 500-400 MG tablet Take 1 tablet by mouth daily.      . metoprolol tartrate (LOPRESSOR) 25 MG tablet Take 12.5 mg by mouth 2 (two) times daily.      . Multiple Vitamin (MULTIVITAMIN WITH MINERALS) TABS Take 1 tablet by mouth daily.      . sertraline (ZOLOFT) 100 MG tablet Take 150 mg by mouth daily.      . nitroGLYCERIN (NITROSTAT) 0.4 MG SL tablet Place 0.4 mg under the tongue every 5 (five) minutes as needed for chest  pain.       Allergies  Allergen Reactions  . Oxycodone     Decreased appetite.  . Codeine Diarrhea and Nausea And Vomiting  . Contrast Media [Iodinated Diagnostic Agents] Hives  . Darvocet [Propoxyphene-Acetaminophen] Diarrhea and Nausea And Vomiting  . Erythromycin Nausea And Vomiting    History  Substance Use Topics  . Smoking status: Never Smoker   . Smokeless tobacco: Never Used  . Alcohol Use: 2.4 oz/week    2 Glasses of wine, 2 Cans of beer per week     Comment: occasionally    Family History  Problem Relation Age of Onset  . Hypertension Mother   . Transient ischemic attack Mother   . Diabetes Father   . Coronary artery disease Father   . Prostate  cancer Brother      Review of Systems A comprehensive review of systems was negative.  Objective: Vital signs in last 24 hours: Temp:  [97.3 F (36.3 C)] 97.3 F (36.3 C) (08/21 0600) Pulse Rate:  [72] 72 (08/21 0600) Resp:  [18] 18 (08/21 0600) BP: (139)/(78) 139/78 mmHg (08/21 0600) SpO2:  [98 %] 98 % (08/21 0600)  EXAM: Patient is a well-developed well-nourished white female, immobilized in a Aspen cervical collar, in no acute distress. Lungs are clear to auscultation , the patient has symmetrical respiratory excursion. Heart has a regular rate and rhythm normal S1 and S2 no murmur.   Abdomen is soft nontender nondistended bowel sounds are present. Extremity examination shows no clubbing cyanosis or edema. Motor examination shows weakness of the left triceps 4 minus/5 and left wrist extensor 4/5. The remainder of the upper extremity strength is 5/5 in the upper extremities including the deltoid and biceps bilaterally, the right triceps and wrist extensor, and the intrinsics and grip bilaterally. Sensation is intact to pinprick throughout the digits of the upper extremities. Reflexes are symmetrical and without evidence of pathologic reflexes. Patient has a normal gait and stance.   Data Review:CBC    Component Value Date/Time   WBC 8.0 10/21/2012 1410   RBC 4.42 10/21/2012 1410   HGB 13.8 10/21/2012 1410   HCT 40.2 10/21/2012 1410   PLT 273 10/21/2012 1410   MCV 91.0 10/21/2012 1410   MCH 31.2 10/21/2012 1410   MCHC 34.3 10/21/2012 1410   RDW 13.6 10/21/2012 1410   LYMPHSABS 0.9 08/01/2012 1410   MONOABS 1.0 08/01/2012 1410   EOSABS 0.1 08/01/2012 1410   BASOSABS 0.0 08/01/2012 1410                          BMET    Component Value Date/Time   NA 142 10/21/2012 1410   K 4.5 10/21/2012 1410   CL 105 10/21/2012 1410   CO2 29 10/21/2012 1410   GLUCOSE 87 10/21/2012 1410   BUN 16 10/21/2012 1410   CREATININE 0.87 10/21/2012 1410   CALCIUM 9.9 10/21/2012 1410   GFRNONAA 64* 10/21/2012 1410    GFRAA 74* 10/21/2012 1410     Assessment/Plan: Patient with non-healed fractures of C6 and C7, advanced degenerative disease and spondylosis at C5-6 worse than it C6-7, and an anterolisthesis C6 on C7 with weakness of the left triceps and wrist extensor. Patient is admitted for 2 level ACDF.  I've discussed with the patient the nature of his condition, the nature the surgical procedure, the typical length of surgery, hospital stay, and overall recuperation. We discussed limitations postoperatively. I discussed risks of surgery including  risks of infection, bleeding, possibly need for transfusion, the risk of nerve root dysfunction with pain, weakness, numbness, or paresthesias, the risk of spinal cord dysfunction with paralysis of all 4 limbs and quadriplegia, and the risk of dural tear and CSF leakage and possible need for further surgery, the risk of esophageal dysfunction causing dysphagia and the risk of laryngeal dysfunction causing hoarseness of the voice, the risk of failure of the arthrodesis and the possible need for further surgery, and the risk of anesthetic complications including myocardial infarction, stroke, pneumonia, and death. We also discussed the need for postoperative immobilization in a cervical collar. Understanding all this the patient does wish to proceed with surgery and is admitted for such.    Hewitt Shorts, MD 10/23/2012 7:19 AM

## 2012-10-23 NOTE — Op Note (Signed)
10/23/2012  11:47 AM  PATIENT:  Adriana [name]  78 y.o. female  PRE-OPERATIVE DIAGNOSIS:  Cervical 6 and cervical 7 fracture, C6 on 7 anterolisthesis, cervical spondylosis, cervical degenerative disc disease, cervical radiculopathy  POST-OPERATIVE DIAGNOSIS:  Cervical 6 and cervical 7 fracture, C6 on 7 anterolisthesis, cervical spondylosis, cervical degenerative disc disease, cervical radiculopathy  PROCEDURE:  Procedure(s): Cervical Five to Cervical Six, Cervical Six to Cervical Seven anterior cervical decompression with fusion plating and bonegraft: C5-6 and C6-7 anterior cervical decompression and arthrodesis with allograft and tether cervical plating  SURGEON:  Surgeon(s): Hewitt Shorts, MD Reinaldo Meeker, MD  ASSISTANTS: Aliene Beams, M.D.  ANESTHESIA:   general  EBL:  Total I/O In: 1000 [I.V.:1000] Out: 50 [Blood:50]  BLOOD ADMINISTERED:none  COUNT: Correct per nursing staff  DICTATION: Patient was brought to the operating room placed under general endotracheal anesthesia. Patient was placed in 10 pounds of halter traction. The neck was prepped with Betadine soap and solution and draped in a sterile fashion. A horizontal incision was made on the left side of the neck. The line of the incision was infiltrated with local anesthetic with epinephrine. Dissection was carried down thru the subcutaneous tissue and platysma, bipolar cautery was used to maintain hemostasis. Dissection was then carried out thru an avascular plane leaving the sternocleidomastoid carotid artery and jugular vein laterally and the trachea and esophagus medially. The ventral aspect of the vertebral column was identified and a localizing x-ray was taken. The C5-6 and C6-7 levels were identified. The annulus at each level was incised and the disc space entered. Discectomy was performed with micro-curettes and pituitary rongeurs. The operating microscope was draped and brought into the field provided additional  magnification illumination and visualization. Discectomy was continued posteriorly thru the disc space and then the cartilaginous endplate was removed using micro-curettes along with the high-speed drill. Posterior osteophytic overgrowth was removed each level using the high-speed drill along with a 2 mm thin footplated Kerrison punch. Posterior longitudinal ligament along with disc herniation was carefully removed, decompressing the spinal canal and thecal sac. We then continued to remove osteophytic overgrowth and disc material decompressing the neural foramina and exiting nerve roots bilaterally. Particular attention was paid to the left C6-7 neural foramina, and the lateral spinal canal and neural foramen was carefully decompressed. Once the decompression was completed hemostasis was established at each level with the use of Gelfoam with thrombin, Surgifoam, and bipolar cautery. The Gelfoam was removed the wound irrigated and hemostasis confirmed. We then measured the height of the intravertebral disc space level and selected a 7 millimeter in height structural allograft for the C5-6 level and a 7 millimeter in height structural allograft for the C6-7 level . Each was hydrated and saline solution and then gently positioned in the intravertebral disc space and countersunk. We then selected a 35 millimeter in height Tether cervical plate. It was positioned over the fusion construct and secured to the vertebra with a pair of 4 x 14 mm variable screws at the C5 level, a single 4 x 14 mm fixed screw at the C6 level, and a pair of 4 x 14 mm variable screws at the C7 level. Each screw hole was started with the high-speed drill and then the screws placed, once all the screws were placed final tightening was performed. The wound was irrigated with bacitracin solution checked for hemostasis which was established and confirmed. An x-ray was taken which showed grafts in good position, the plate and screws in  good position,  and the overall alignment to be good. We then proceeded with closure. The platysma was closed with interrupted inverted 2-0 undyed Vicryl suture, the subcutaneous and subcuticular closed with interrupted inverted 3-0 undyed Vicryl suture. The skin edges were approximated with Dermabond. Following surgery the patient was placed back in her Aspen cervical collar. Following surgery the patient was taken out of cervical traction. To be reversed and the anesthetic and taken to the recovery room for further care.   PLAN OF CARE: Admit for overnight observation  PATIENT DISPOSITION:  PACU - hemodynamically stable.   Delay start of Pharmacological VTE agent (>24hrs) due to surgical blood loss or risk of bleeding:  yes

## 2012-10-23 NOTE — Progress Notes (Signed)
Filed Vitals:   10/23/12 1145 10/23/12 1155 10/23/12 1223 10/23/12 1637  BP:   145/81 127/79  Pulse: 79  78 87  Temp:  97.8 F (36.6 C) 97.7 F (36.5 C) 97.8 F (36.6 C)  TempSrc:      Resp: 11  16 16   SpO2: 99%  97% 93%    CBC  Recent Labs  10/21/12 1410  WBC 8.0  HGB 13.8  HCT 40.2  PLT 273   BMET  Recent Labs  10/21/12 1410  NA 142  K 4.5  CL 105  CO2 29  GLUCOSE 87  BUN 16  CREATININE 0.87  CALCIUM 9.9    Patient resting in bed, mild discomfort. Wound clean dry, no swelling, erythema, or drainage. His ambulating once, encouraged to ambulate again before bedtime. Had some nausea and vomiting, therefore minimal by mouth intake so far  Plan: Continued to progress thru postoperative recovery.  Hewitt Shorts, MD 10/23/2012, 8:54 PM

## 2012-10-23 NOTE — Anesthesia Preprocedure Evaluation (Signed)
Anesthesia Evaluation  Patient identified by MRN, date of birth, ID band Patient awake    Reviewed: Allergy & Precautions, H&P , NPO status , Patient's Chart, lab work & pertinent test results  History of Anesthesia Complications Negative for: history of anesthetic complications  Airway Mallampati: II TM Distance: >3 FB Neck ROM: Limited    Dental  (+) Teeth Intact and Dental Advisory Given   Pulmonary neg pulmonary ROS,  breath sounds clear to auscultation  Pulmonary exam normal       Cardiovascular hypertension, Pt. on medications and Pt. on home beta blockers - Valvular Problems/MurmursRhythm:Regular Rate:Normal  '12 ECHO: normal valves, no evidence of MVP, EF >55% '12 cath: normal coronaries, normal LVF   Neuro/Psych Depression Fell in may and injured her neck, presently in hard  C-collar    GI/Hepatic negative GI ROS, Neg liver ROS,   Endo/Other  negative endocrine ROS  Renal/GU negative Renal ROS     Musculoskeletal   Abdominal   Peds  Hematology   Anesthesia Other Findings   Reproductive/Obstetrics                           Anesthesia Physical Anesthesia Plan  ASA: II  Anesthesia Plan: General   Post-op Pain Management:    Induction: Intravenous  Airway Management Planned: Oral ETT and Video Laryngoscope Planned  Additional Equipment:   Intra-op Plan:   Post-operative Plan: Extubation in OR  Informed Consent: I have reviewed the patients History and Physical, chart, labs and discussed the procedure including the risks, benefits and alternatives for the proposed anesthesia with the patient or authorized representative who has indicated his/her understanding and acceptance.   Dental advisory given  Plan Discussed with: CRNA and Surgeon  Anesthesia Plan Comments: (Plan routine monitors, GETA)        Anesthesia Quick Evaluation

## 2012-10-24 ENCOUNTER — Encounter (HOSPITAL_COMMUNITY): Payer: Self-pay | Admitting: Neurosurgery

## 2012-10-24 MED ORDER — HYDROCODONE-ACETAMINOPHEN 5-325 MG PO TABS: 1.0000 | ORAL_TABLET | ORAL | Status: DC | PRN

## 2012-10-24 NOTE — Progress Notes (Signed)
Pt and husband given D/C instructions with Rx, verbal understanding given. Pt D/C'd home via wheelchair @ 1015 per MD order. Rema Fendt, RN

## 2012-10-24 NOTE — Discharge Summary (Signed)
Physician Discharge Summary  Patient ID: Adriana [name] MRN: [id number] DOB/AGE: [birth year] 78 y.o.  Admit date: 10/23/2012 Discharge date: 10/24/2012  Admission Diagnoses:  Cervical 6 and cervical 7 fracture, C6 on 7 anterolisthesis, cervical spondylosis, cervical degenerative disc disease, cervical radiculopathy  Discharge Diagnoses:  Cervical 6 and cervical 7 fracture, C6 on 7 anterolisthesis, cervical spondylosis, cervical degenerative disc disease, cervical radiculopathy  Active Problems:   * No active hospital problems. *  Discharged Condition: good  Hospital Course: Patient was admitted underwent a C5-6 and C6-7 ACDF. Postoperatively she has done well. She is up and ambulating. Her wound is healing well. She is being discharged home with instructions regarding wound care and activities. She is to return for followup with me in 3 weeks.  Discharge Exam: Blood pressure 116/73, pulse 72, temperature 98.1 F (36.7 C), temperature source Oral, resp. rate 16, SpO2 92.00%.  Disposition: home     Medication List         alendronate 70 MG tablet  Commonly known as:  FOSAMAX  Take 70 mg by mouth every 7 (seven) days. Take with a full glass of water on an empty stomach.  Taken on Sundays.     aspirin EC 81 MG tablet  Take 81 mg by mouth daily.     buPROPion 150 MG 24 hr tablet  Commonly known as:  WELLBUTRIN XL  Take 450 mg by mouth daily.     calcium-vitamin D 500-200 MG-UNIT per tablet  Commonly known as:  OSCAL WITH D  Take 1 tablet by mouth 2 (two) times daily.     glucosamine-chondroitin 500-400 MG tablet  Take 1 tablet by mouth daily.     HYDROcodone-acetaminophen 5-325 MG per tablet  Commonly known as:  NORCO/VICODIN  Take 1-2 tablets by mouth every 4 (four) hours as needed for pain.     metoprolol tartrate 25 MG tablet  Commonly known as:  LOPRESSOR  Take 12.5 mg by mouth 2 (two) times daily.     multivitamin with minerals Tabs tablet  Take 1 tablet by mouth  daily.     nitroGLYCERIN 0.4 MG SL tablet  Commonly known as:  NITROSTAT  Place 0.4 mg under the tongue every 5 (five) minutes as needed for chest pain.     sertraline 100 MG tablet  Commonly known as:  ZOLOFT  Take 150 mg by mouth daily.         Signed: Hewitt Shorts, MD 10/24/2012, 7:55 AM

## 2012-12-23 ENCOUNTER — Other Ambulatory Visit: Payer: Self-pay | Admitting: Family Medicine

## 2012-12-23 DIAGNOSIS — R1031 Right lower quadrant pain: Secondary | ICD-10-CM

## 2012-12-24 ENCOUNTER — Ambulatory Visit
Admission: RE | Admit: 2012-12-24 | Discharge: 2012-12-24 | Disposition: A | Discharge status: Home / Self Care | Payer: 59 | Source: Ambulatory Visit | Attending: Family Medicine | Admitting: Family Medicine

## 2012-12-24 DIAGNOSIS — R1031 Right lower quadrant pain: Secondary | ICD-10-CM

## 2012-12-24 MED ORDER — IOHEXOL 300 MG/ML  SOLN
100.0000 mL | Freq: Once | INTRAMUSCULAR | Status: AC | PRN
  Administered 2012-12-24: 100 mL via INTRAVENOUS

## 2013-01-12 ENCOUNTER — Other Ambulatory Visit (HOSPITAL_COMMUNITY): Payer: Self-pay | Admitting: Family Medicine

## 2013-01-12 DIAGNOSIS — Z1231 Encounter for screening mammogram for malignant neoplasm of breast: Secondary | ICD-10-CM

## 2013-01-27 ENCOUNTER — Ambulatory Visit (HOSPITAL_COMMUNITY): Payer: 59

## 2013-07-07 ENCOUNTER — Ambulatory Visit: Payer: Medicare Other | Attending: Family Medicine

## 2013-07-07 DIAGNOSIS — R293 Abnormal posture: Secondary | ICD-10-CM | POA: Insufficient documentation

## 2013-07-07 DIAGNOSIS — IMO0001 Reserved for inherently not codable concepts without codable children: Secondary | ICD-10-CM | POA: Insufficient documentation

## 2013-07-07 DIAGNOSIS — R5381 Other malaise: Secondary | ICD-10-CM | POA: Insufficient documentation

## 2013-07-07 DIAGNOSIS — R269 Unspecified abnormalities of gait and mobility: Secondary | ICD-10-CM | POA: Insufficient documentation

## 2013-07-14 ENCOUNTER — Ambulatory Visit: Payer: Medicare Other | Admitting: Physical Therapy

## 2013-07-16 ENCOUNTER — Ambulatory Visit: Payer: Medicare Other | Admitting: Physical Therapy

## 2013-07-20 ENCOUNTER — Ambulatory Visit: Payer: Medicare Other | Admitting: Physical Therapy

## 2013-07-22 ENCOUNTER — Ambulatory Visit: Payer: Medicare Other

## 2013-07-28 ENCOUNTER — Ambulatory Visit: Payer: Medicare Other

## 2013-07-30 ENCOUNTER — Ambulatory Visit: Payer: Medicare Other

## 2013-08-03 ENCOUNTER — Ambulatory Visit: Payer: Medicare Other | Attending: Family Medicine | Admitting: Physical Therapy

## 2013-08-03 DIAGNOSIS — IMO0001 Reserved for inherently not codable concepts without codable children: Secondary | ICD-10-CM | POA: Insufficient documentation

## 2013-08-03 DIAGNOSIS — R269 Unspecified abnormalities of gait and mobility: Secondary | ICD-10-CM | POA: Insufficient documentation

## 2013-08-03 DIAGNOSIS — R293 Abnormal posture: Secondary | ICD-10-CM | POA: Insufficient documentation

## 2013-08-03 DIAGNOSIS — R5381 Other malaise: Secondary | ICD-10-CM | POA: Insufficient documentation

## 2013-08-06 ENCOUNTER — Ambulatory Visit: Payer: Medicare Other | Admitting: Physical Therapy

## 2013-08-10 ENCOUNTER — Ambulatory Visit: Payer: Medicare Other

## 2013-08-11 ENCOUNTER — Encounter: Payer: 59 | Admitting: Physical Therapy

## 2013-08-13 ENCOUNTER — Ambulatory Visit: Payer: Medicare Other

## 2013-08-17 ENCOUNTER — Ambulatory Visit: Payer: Medicare Other

## 2013-08-21 ENCOUNTER — Ambulatory Visit: Payer: Medicare Other

## 2013-08-24 ENCOUNTER — Ambulatory Visit: Payer: Medicare Other

## 2013-08-27 ENCOUNTER — Ambulatory Visit: Payer: Medicare Other

## 2013-08-31 ENCOUNTER — Ambulatory Visit: Payer: Medicare Other

## 2013-09-01 ENCOUNTER — Ambulatory Visit: Payer: Medicare Other

## 2013-09-02 ENCOUNTER — Ambulatory Visit: Payer: Medicare Other | Attending: Family Medicine | Admitting: Physical Therapy

## 2013-09-02 DIAGNOSIS — R5381 Other malaise: Secondary | ICD-10-CM | POA: Diagnosis not present

## 2013-09-02 DIAGNOSIS — R269 Unspecified abnormalities of gait and mobility: Secondary | ICD-10-CM | POA: Insufficient documentation

## 2013-09-02 DIAGNOSIS — IMO0001 Reserved for inherently not codable concepts without codable children: Secondary | ICD-10-CM | POA: Insufficient documentation

## 2013-09-02 DIAGNOSIS — R293 Abnormal posture: Secondary | ICD-10-CM | POA: Diagnosis not present

## 2013-09-08 ENCOUNTER — Ambulatory Visit: Payer: Medicare Other

## 2013-09-10 ENCOUNTER — Ambulatory Visit: Payer: Medicare Other | Admitting: Physical Therapy

## 2013-09-10 DIAGNOSIS — IMO0001 Reserved for inherently not codable concepts without codable children: Secondary | ICD-10-CM | POA: Diagnosis not present

## 2013-09-15 ENCOUNTER — Ambulatory Visit: Payer: Medicare Other

## 2013-09-15 DIAGNOSIS — IMO0001 Reserved for inherently not codable concepts without codable children: Secondary | ICD-10-CM | POA: Diagnosis not present

## 2013-09-17 ENCOUNTER — Ambulatory Visit: Payer: Medicare Other

## 2013-09-17 DIAGNOSIS — IMO0001 Reserved for inherently not codable concepts without codable children: Secondary | ICD-10-CM | POA: Diagnosis not present

## 2013-09-22 ENCOUNTER — Ambulatory Visit: Payer: Medicare Other | Admitting: Physical Therapy

## 2013-09-24 ENCOUNTER — Ambulatory Visit: Payer: Medicare Other

## 2013-09-24 DIAGNOSIS — IMO0001 Reserved for inherently not codable concepts without codable children: Secondary | ICD-10-CM | POA: Diagnosis not present

## 2013-09-29 ENCOUNTER — Ambulatory Visit: Payer: Medicare Other

## 2013-09-29 DIAGNOSIS — IMO0001 Reserved for inherently not codable concepts without codable children: Secondary | ICD-10-CM | POA: Diagnosis not present

## 2013-10-01 ENCOUNTER — Ambulatory Visit: Payer: Medicare Other

## 2013-10-31 ENCOUNTER — Emergency Department (HOSPITAL_COMMUNITY)
Admission: EM | Admit: 2013-10-31 | Discharge: 2013-10-31 | Disposition: A | Discharge status: Home / Self Care | Payer: Medicare Other | Attending: Emergency Medicine | Admitting: Emergency Medicine

## 2013-10-31 ENCOUNTER — Emergency Department (HOSPITAL_COMMUNITY): Payer: Medicare Other

## 2013-10-31 ENCOUNTER — Encounter (HOSPITAL_COMMUNITY): Payer: Self-pay | Admitting: Emergency Medicine

## 2013-10-31 DIAGNOSIS — I209 Angina pectoris, unspecified: Secondary | ICD-10-CM | POA: Insufficient documentation

## 2013-10-31 DIAGNOSIS — F329 Major depressive disorder, single episode, unspecified: Secondary | ICD-10-CM | POA: Diagnosis not present

## 2013-10-31 DIAGNOSIS — M129 Arthropathy, unspecified: Secondary | ICD-10-CM | POA: Insufficient documentation

## 2013-10-31 DIAGNOSIS — R109 Unspecified abdominal pain: Secondary | ICD-10-CM | POA: Insufficient documentation

## 2013-10-31 DIAGNOSIS — Z9889 Other specified postprocedural states: Secondary | ICD-10-CM | POA: Diagnosis not present

## 2013-10-31 DIAGNOSIS — I10 Essential (primary) hypertension: Secondary | ICD-10-CM | POA: Insufficient documentation

## 2013-10-31 DIAGNOSIS — F3289 Other specified depressive episodes: Secondary | ICD-10-CM | POA: Diagnosis not present

## 2013-10-31 DIAGNOSIS — Z87828 Personal history of other (healed) physical injury and trauma: Secondary | ICD-10-CM | POA: Insufficient documentation

## 2013-10-31 DIAGNOSIS — Z79899 Other long term (current) drug therapy: Secondary | ICD-10-CM | POA: Insufficient documentation

## 2013-10-31 DIAGNOSIS — R300 Dysuria: Secondary | ICD-10-CM | POA: Insufficient documentation

## 2013-10-31 DIAGNOSIS — Z7982 Long term (current) use of aspirin: Secondary | ICD-10-CM | POA: Insufficient documentation

## 2013-10-31 DIAGNOSIS — K5732 Diverticulitis of large intestine without perforation or abscess without bleeding: Secondary | ICD-10-CM | POA: Insufficient documentation

## 2013-10-31 DIAGNOSIS — K5792 Diverticulitis of intestine, part unspecified, without perforation or abscess without bleeding: Secondary | ICD-10-CM

## 2013-10-31 LAB — COMPREHENSIVE METABOLIC PANEL
ALT: 17 U/L (ref 0–35)
AST: 24 U/L (ref 0–37)
Albumin: 3.9 g/dL (ref 3.5–5.2)
Alkaline Phosphatase: 73 U/L (ref 39–117)
Anion gap: 14 (ref 5–15)
BUN: 18 mg/dL (ref 6–23)
CO2: 27 mEq/L (ref 19–32)
Calcium: 9.8 mg/dL (ref 8.4–10.5)
Chloride: 102 mEq/L (ref 96–112)
Creatinine, Ser: 1 mg/dL (ref 0.50–1.10)
GFR calc Af Amer: 62 mL/min — ABNORMAL LOW (ref >90)
GFR calc non Af Amer: 53 mL/min — ABNORMAL LOW (ref >90)
Glucose, Bld: 110 mg/dL — ABNORMAL HIGH (ref 70–99)
Potassium: 4.1 mEq/L (ref 3.7–5.3)
Sodium: 143 mEq/L (ref 137–147)
Total Bilirubin: 0.4 mg/dL (ref 0.3–1.2)
Total Protein: 7.3 g/dL (ref 6.0–8.3)

## 2013-10-31 LAB — CBC WITH DIFFERENTIAL/PLATELET
Basophils Absolute: 0 10*3/uL (ref 0.0–0.1)
Basophils Relative: 0 % (ref 0–1)
Eosinophils Absolute: 0.1 10*3/uL (ref 0.0–0.7)
Eosinophils Relative: 1 % (ref 0–5)
HCT: 39.5 % (ref 36.0–46.0)
Hemoglobin: 13.2 g/dL (ref 12.0–15.0)
Lymphocytes Relative: 9 % — ABNORMAL LOW (ref 12–46)
Lymphs Abs: 1 10*3/uL (ref 0.7–4.0)
MCH: 31.4 pg (ref 26.0–34.0)
MCHC: 33.4 g/dL (ref 30.0–36.0)
MCV: 93.8 fL (ref 78.0–100.0)
Monocytes Absolute: 1 10*3/uL (ref 0.1–1.0)
Monocytes Relative: 8 % (ref 3–12)
Neutro Abs: 9.9 10*3/uL — ABNORMAL HIGH (ref 1.7–7.7)
Neutrophils Relative %: 82 % — ABNORMAL HIGH (ref 43–77)
Platelets: 253 10*3/uL (ref 150–400)
RBC: 4.21 MIL/uL (ref 3.87–5.11)
RDW: 12.3 % (ref 11.5–15.5)
WBC: 12 10*3/uL — ABNORMAL HIGH (ref 4.0–10.5)

## 2013-10-31 LAB — URINALYSIS, ROUTINE W REFLEX MICROSCOPIC
Bilirubin Urine: NEGATIVE
Glucose, UA: NEGATIVE mg/dL
Ketones, ur: NEGATIVE mg/dL
Leukocytes, UA: NEGATIVE
Nitrite: NEGATIVE
Protein, ur: NEGATIVE mg/dL
Specific Gravity, Urine: 1.014 (ref 1.005–1.030)
Urobilinogen, UA: 0.2 mg/dL (ref 0.0–1.0)
pH: 6 (ref 5.0–8.0)

## 2013-10-31 LAB — URINE MICROSCOPIC-ADD ON

## 2013-10-31 LAB — LIPASE, BLOOD: Lipase: 19 U/L (ref 11–59)

## 2013-10-31 MED ORDER — LOPERAMIDE HCL 2 MG PO CAPS
4.0000 mg | ORAL_CAPSULE | Freq: Once | ORAL | Status: AC
Start: 2013-10-31 — End: 2013-10-31
  Administered 2013-10-31: 4 mg via ORAL
  Filled 2013-10-31: qty 2

## 2013-10-31 MED ORDER — METHYLPREDNISOLONE SODIUM SUCC 125 MG IJ SOLR
125.0000 mg | Freq: Once | INTRAMUSCULAR | Status: AC
  Administered 2013-10-31: 125 mg via INTRAVENOUS
  Filled 2013-10-31: qty 2

## 2013-10-31 MED ORDER — METRONIDAZOLE 500 MG PO TABS
500.0000 mg | ORAL_TABLET | Freq: Once | ORAL | Status: AC
Start: 2013-10-31 — End: 2013-10-31
  Administered 2013-10-31: 500 mg via ORAL
  Filled 2013-10-31: qty 1

## 2013-10-31 MED ORDER — TRAMADOL HCL 50 MG PO TABS: 50.0000 mg | ORAL_TABLET | Freq: Four times a day (QID) | ORAL | Status: DC | PRN

## 2013-10-31 MED ORDER — METRONIDAZOLE 500 MG PO TABS: 500.0000 mg | ORAL_TABLET | Freq: Two times a day (BID) | ORAL | Status: DC

## 2013-10-31 MED ORDER — IOHEXOL 300 MG/ML  SOLN
100.0000 mL | Freq: Once | INTRAMUSCULAR | Status: AC | PRN
  Administered 2013-10-31: 100 mL via INTRAVENOUS

## 2013-10-31 MED ORDER — CIPROFLOXACIN HCL 500 MG PO TABS: 500.0000 mg | ORAL_TABLET | Freq: Two times a day (BID) | ORAL | Status: DC

## 2013-10-31 MED ORDER — DIPHENHYDRAMINE HCL 25 MG PO CAPS: 50.0000 mg | ORAL_CAPSULE | Freq: Once | ORAL | Status: DC

## 2013-10-31 MED ORDER — DIPHENHYDRAMINE HCL 50 MG/ML IJ SOLN
50.0000 mg | Freq: Once | INTRAMUSCULAR | Status: AC
  Administered 2013-10-31: 50 mg via INTRAVENOUS
  Filled 2013-10-31: qty 1

## 2013-10-31 MED ORDER — IOHEXOL 300 MG/ML  SOLN
50.0000 mL | Freq: Once | INTRAMUSCULAR | Status: AC | PRN
  Administered 2013-10-31: 50 mL via ORAL

## 2013-10-31 MED ORDER — CIPROFLOXACIN HCL 500 MG PO TABS
500.0000 mg | ORAL_TABLET | Freq: Once | ORAL | Status: AC
  Administered 2013-10-31: 500 mg via ORAL
  Filled 2013-10-31: qty 1

## 2013-10-31 NOTE — Discharge Instructions (Signed)
Diverticulitis Take Tylenol for mild pain or the pain medicine prescribed for bad pain. Call Dr. Corliss Blacker to arrange to be seen in her office or return if not feeling better in a week. Return if you're unable to hold down the medicine prescribed without vomiting, develop fever or feel worse for any reason Diverticulitis is when small pockets that have formed in your colon (large intestine) become infected or swollen. HOME CARE  Follow your doctor's instructions.  Follow a special diet if told by your doctor.  When you feel better, your doctor may tell you to change your diet. You may be told to eat a lot of fiber. Fruits and vegetables are good sources of fiber. Fiber makes it easier to poop (have bowel movements).  Take supplements or probiotics as told by your doctor.  Only take medicines as told by your doctor.  Keep all follow-up visits with your doctor. GET HELP IF:  Your pain does not get better.  You have a hard time eating food.  You are not pooping like normal. GET HELP RIGHT AWAY IF:  Your pain gets worse.  Your problems do not get better.  Your problems suddenly get worse.  You have a fever.  You keep throwing up (vomiting).  You have bloody or black, tarry poop (stool). MAKE SURE YOU:   Understand these instructions.  Will watch your condition.  Will get help right away if you are not doing well or get worse. Document Released: 08/08/2007 Document Revised: 02/24/2013 Document Reviewed: 01/14/2013 [facility] Patient Information 2015 [place], [place]. This information is not intended to replace advice given to you by your health care provider. Make sure you discuss any questions you have with your health care provider.

## 2013-10-31 NOTE — ED Provider Notes (Signed)
CSN: [id number]     Arrival date & time 10/31/13  1055 History   First MD Initiated Contact with Patient 10/31/13 1212     Chief Complaint  Patient presents with  . Abdominal Pain     (Consider location/radiation/quality/duration/timing/severity/associated sxs/prior Treatment) HPI Complains of lower abdominal pain gradual onset 2 days ago. Last bowel movement yesterday, normal associated symptoms included anorexia. She also reports urinary "hesitancy" though a wet and it or bladder. Seen at urgent care center earlier today sent here for further evaluation. Pain is mild at present made worse she states when call went over a bump pain is nonradiating no other associated symptoms. No treatment prior to coming Past Medical History  Diagnosis Date  . Anginal pain 08/24/2010    Echo-EF =>55%,LV normal  . Chest pain, unspecified 09/04/2007    Lexiscan-- EF 72%; LV normal  . Osteopenia   . Arthritis   . Mitral valve prolapse   . Hypertension   . Depression   . Head injury, closed     with fall  . Constipation     Takes flax seed and honey .  Smooth Move tea.   Past Surgical History  Procedure Laterality Date  . Cardiac catheterization  08/01/2010    normal coronaries  . Colonoscopy  approx 2011  . Intestinal blockage surgery  Feb. 2012    "kink in small intestine" per pt  . Eye surgery Bilateral 2011  . Anterior cervical decomp/discectomy fusion N/A 10/23/2012    Procedure: Cervical Five to Cervical Six, Cervical Six to Cervical Seven anterior cervical decompression with fusion plating and bonegraft;  Surgeon: Hewitt Shorts, MD;  Location: [facility];  Service: Neurosurgery;  Laterality: N/A;  ANTERIOR CERVICAL DECOMPRESSION/DISCECTOMY FUSION 2 LEVELS   Family History  Problem Relation Age of Onset  . Hypertension Mother   . Transient ischemic attack Mother   . Diabetes Father   . Coronary artery disease Father   . Prostate cancer Brother    History  Substance Use Topics   . Smoking status: Never Smoker   . Smokeless tobacco: Never Used  . Alcohol Use: 2.4 oz/week    2 Glasses of wine, 2 Cans of beer per week     Comment: occasionally   OB History   Grav Para Term Preterm Abortions TAB SAB Ect Mult Living                 Review of Systems  Constitutional: Negative.   HENT: Negative.   Respiratory: Negative.   Cardiovascular: Negative.   Gastrointestinal: Positive for abdominal pain.  Genitourinary: Positive for dysuria.  Musculoskeletal: Negative.   Skin: Negative.   Neurological: Negative.   Psychiatric/Behavioral: Negative.   All other systems reviewed and are negative.     Allergies  Contrast media; Oxycodone; Codeine; Darvocet; and Erythromycin  Home Medications   Prior to Admission medications   Medication Sig Start Date End Date Taking? Authorizing Provider  aspirin (BAYER ADVANCED ASPIRIN REG ST) 325 MG tablet Take 650 mg by mouth every 4 (four) hours as needed for mild pain.   Yes Historical Provider, MD  aspirin EC 81 MG tablet Take 81 mg by mouth daily.   Yes Historical Provider, MD  buPROPion (WELLBUTRIN XL) 150 MG 24 hr tablet Take 450 mg by mouth daily with breakfast.    Yes Historical Provider, MD  calcium citrate (CALCITRATE - DOSED IN MG ELEMENTAL CALCIUM) 950 MG tablet Take 200 mg of elemental calcium by mouth 2 (  two) times daily.   Yes Historical Provider, MD  Cholecalciferol (VITAMIN D-3) 5000 UNITS TABS Take 5,000 Units by mouth 3 (three) times a week. Takes on Monday, Wednesday, and Friday   Yes Historical Provider, MD  glucosamine-chondroitin 500-400 MG tablet Take 1 tablet by mouth 2 (two) times daily.    Yes Historical Provider, MD  metoprolol tartrate (LOPRESSOR) 25 MG tablet Take 12.5 mg by mouth 2 (two) times daily.   Yes Historical Provider, MD  Multiple Vitamin (MULTIVITAMIN WITH MINERALS) TABS Take 1 tablet by mouth daily.   Yes Historical Provider, MD  pneumococcal 13-valent conjugate vaccine (PREVNAR 13) SUSP  injection Inject 0.5 mLs into the muscle once.   Yes Historical Provider, MD  zoster vaccine live, PF, (ZOSTAVAX) 11914 UNT/0.65ML injection Inject 0.65 mLs into the skin once.   Yes Historical Provider, MD   BP 134/69  Pulse 76  Temp(Src) 98.4 F (36.9 C) (Oral)  Resp 14  SpO2 100% Physical Exam  Nursing note and vitals reviewed. Constitutional: She appears well-developed and well-nourished.  HENT:  Head: Normocephalic and atraumatic.  Eyes: Conjunctivae are normal. Pupils are equal, round, and reactive to light.  Neck: Neck supple. No tracheal deviation present. No thyromegaly present.  Cardiovascular: Normal rate and regular rhythm.   No murmur heard. Pulmonary/Chest: Effort normal and breath sounds normal.  Abdominal: Soft. Bowel sounds are normal. She exhibits no distension and no mass. There is tenderness. There is no rebound and no guarding.  Tenderness at left lower quadrant, infraumbilical area and right lower quadrant. Patient is most tender at right lower quadrant  Musculoskeletal: Normal range of motion. She exhibits no edema and no tenderness.  Neurological: She is alert. Coordination normal.  Skin: Skin is warm and dry. No rash noted.  Psychiatric: She has a normal mood and affect.    ED Course  Procedures (including critical care time) Labs Review Labs Reviewed  CBC WITH DIFFERENTIAL - Abnormal; Notable for the following:    WBC 12.0 (*)    Neutrophils Relative % 82 (*)    Neutro Abs 9.9 (*)    Lymphocytes Relative 9 (*)    All other components within normal limits  COMPREHENSIVE METABOLIC PANEL  LIPASE, BLOOD    Imaging Review No results found.   EKG Interpretation None     Patient declines pain medicine. 3:20 PM patient resting comfortably. Declined pain medicine. Results for orders placed during the hospital encounter of 10/31/13  COMPREHENSIVE METABOLIC PANEL      Result Value Ref Range   Sodium 143  137 - 147 mEq/L   Potassium 4.1  3.7 - 5.3  mEq/L   Chloride 102  96 - 112 mEq/L   CO2 27  19 - 32 mEq/L   Glucose, Bld 110 (*) 70 - 99 mg/dL   BUN 18  6 - 23 mg/dL   Creatinine, Ser 7.82  0.50 - 1.10 mg/dL   Calcium 9.8  8.4 - 95.6 mg/dL   Total Protein 7.3  6.0 - 8.3 g/dL   Albumin 3.9  3.5 - 5.2 g/dL   AST 24  0 - 37 U/L   ALT 17  0 - 35 U/L   Alkaline Phosphatase 73  39 - 117 U/L   Total Bilirubin 0.4  0.3 - 1.2 mg/dL   GFR calc non Af Amer 53 (*) >90 mL/min   GFR calc Af Amer 62 (*) >90 mL/min   Anion gap 14  5 - 15  CBC WITH DIFFERENTIAL  Result Value Ref Range   WBC 12.0 (*) 4.0 - 10.5 K/uL   RBC 4.21  3.87 - 5.11 MIL/uL   Hemoglobin 13.2  12.0 - 15.0 g/dL   HCT 16.1  09.6 - 04.5 %   MCV 93.8  78.0 - 100.0 fL   MCH 31.4  26.0 - 34.0 pg   MCHC 33.4  30.0 - 36.0 g/dL   RDW 40.9  81.1 - 91.4 %   Platelets 253  150 - 400 K/uL   Neutrophils Relative % 82 (*) 43 - 77 %   Neutro Abs 9.9 (*) 1.7 - 7.7 K/uL   Lymphocytes Relative 9 (*) 12 - 46 %   Lymphs Abs 1.0  0.7 - 4.0 K/uL   Monocytes Relative 8  3 - 12 %   Monocytes Absolute 1.0  0.1 - 1.0 K/uL   Eosinophils Relative 1  0 - 5 %   Eosinophils Absolute 0.1  0.0 - 0.7 K/uL   Basophils Relative 0  0 - 1 %   Basophils Absolute 0.0  0.0 - 0.1 K/uL  LIPASE, BLOOD      Result Value Ref Range   Lipase 19  11 - 59 U/L  URINALYSIS, ROUTINE W REFLEX MICROSCOPIC      Result Value Ref Range   Color, Urine YELLOW  YELLOW   APPearance CLEAR  CLEAR   Specific Gravity, Urine 1.014  1.005 - 1.030   pH 6.0  5.0 - 8.0   Glucose, UA NEGATIVE  NEGATIVE mg/dL   Hgb urine dipstick TRACE (*) NEGATIVE   Bilirubin Urine NEGATIVE  NEGATIVE   Ketones, ur NEGATIVE  NEGATIVE mg/dL   Protein, ur NEGATIVE  NEGATIVE mg/dL   Urobilinogen, UA 0.2  0.0 - 1.0 mg/dL   Nitrite NEGATIVE  NEGATIVE   Leukocytes, UA NEGATIVE  NEGATIVE  URINE MICROSCOPIC-ADD ON      Result Value Ref Range   Squamous Epithelial / LPF RARE  RARE   WBC, UA 0-2  <3 WBC/hpf   RBC / HPF 0-2  <3 RBC/hpf    Bacteria, UA RARE  RARE   Ct Abdomen Pelvis W Contrast  10/31/2013   CLINICAL DATA:  79 year old female with right abdominal and pelvic pain.  EXAM: CT ABDOMEN AND PELVIS WITH CONTRAST  TECHNIQUE: Multidetector CT imaging of the abdomen and pelvis was performed using the standard protocol following bolus administration of intravenous contrast.  CONTRAST:  OMNIPAQUE IOHEXOL 300 MG/ML  SOLN  COMPARISON:  12/24/2012  FINDINGS: The liver, spleen, adrenal glands, pancreas and kidneys are unremarkable except for hepatic and renal cysts.  Focal increased density within the gallbladder may represent a polyp, cholelithiasis or sludge.  There is no CT evidence of acute cholecystitis.  Mild wall thickening and adjacent inflammation of the mid colon is compatible with diverticulitis. There is no evidence of bowel obstruction, pneumoperitoneum or abscess.  No other bowel abnormalities are identified.  A small amount of free pelvic fluid is identified.  There is no evidence of biliary dilatation, abdominal aortic aneurysm or large lymph nodes.  Moderate to severe degenerative changes throughout the lumbar spine again identified as well as grade 1 anterolisthesis of L5 on S1.  IMPRESSION: Mid-distal sigmoid diverticulitis without focal abscess or pneumoperitoneum. Clinical followup recommended.  Possible gallbladder polyp, cholelithiasis or sludge. No CT evidence of acute cholecystitis.   Electronically Signed   By: Laveda Abbe M.D.   On: 10/31/2013 15:07    MDM  I discussed inpatient versus outpatient  treatment for diverticulitis. I feel that she can for outpatient therapy. She is in agreement  Plan prescription tramadol, Cipro, Flagyl. Final diagnoses:  None   followup Dr.Mcneill if not better in a week Diagnosis acute diverticulitis     Doug Sou, MD 10/31/13 1527

## 2013-10-31 NOTE — ED Notes (Signed)
Pt seen at [place] walk in [facility] today. Sent to ED to r/o appendicitis. Lower abd pain, nausea, loose BM x2 days. Guarding RLQ upon exam per paperwork sent with pt. UA performed, trace blood and protein. No fever at home, denies vomiting.

## 2013-10-31 NOTE — ED Notes (Signed)
Patient up to bathroom with assistance using cane.  Does not walk safely by herself.

## 2013-10-31 NOTE — ED Notes (Signed)
Patient started with right sided lower abdominal pain several days ago.  Pain started gradually then became progressively worse.  She is rating her pain as a 3.  Reports she feels a little queasy but no vomiting.  Diarrhea yesterday about three times, but today normal bowel movement.

## 2013-12-31 ENCOUNTER — Other Ambulatory Visit: Payer: Self-pay | Admitting: Gastroenterology

## 2013-12-31 DIAGNOSIS — R1031 Right lower quadrant pain: Secondary | ICD-10-CM

## 2014-01-08 ENCOUNTER — Other Ambulatory Visit: Payer: 59

## 2014-01-13 ENCOUNTER — Ambulatory Visit
Admission: RE | Admit: 2014-01-13 | Discharge: 2014-01-13 | Disposition: A | Discharge status: Home / Self Care | Payer: 59 | Source: Ambulatory Visit | Attending: Gastroenterology | Admitting: Gastroenterology

## 2014-01-13 DIAGNOSIS — R1031 Right lower quadrant pain: Secondary | ICD-10-CM

## 2014-05-04 ENCOUNTER — Other Ambulatory Visit: Payer: Self-pay | Admitting: Gynecology

## 2014-05-04 LAB — HM PAP SMEAR: HM Pap smear: NEGATIVE

## 2014-05-07 LAB — CYTOLOGY - PAP

## 2014-10-12 ENCOUNTER — Ambulatory Visit
Admission: RE | Admit: 2014-10-12 | Discharge: 2014-10-12 | Disposition: A | Discharge status: Home / Self Care | Payer: PRIVATE HEALTH INSURANCE | Source: Ambulatory Visit | Attending: Family Medicine | Admitting: Family Medicine

## 2014-10-12 ENCOUNTER — Other Ambulatory Visit: Payer: Self-pay | Admitting: Family Medicine

## 2014-10-12 DIAGNOSIS — R1084 Generalized abdominal pain: Secondary | ICD-10-CM

## 2015-06-29 LAB — HM DEXA SCAN

## 2016-05-24 ENCOUNTER — Other Ambulatory Visit: Payer: Self-pay | Admitting: Family Medicine

## 2016-05-24 DIAGNOSIS — Z1231 Encounter for screening mammogram for malignant neoplasm of breast: Secondary | ICD-10-CM

## 2016-06-08 ENCOUNTER — Ambulatory Visit: Payer: PRIVATE HEALTH INSURANCE

## 2016-07-03 ENCOUNTER — Ambulatory Visit: Payer: PRIVATE HEALTH INSURANCE

## 2016-07-12 ENCOUNTER — Ambulatory Visit: Payer: PRIVATE HEALTH INSURANCE

## 2016-08-01 ENCOUNTER — Ambulatory Visit
Admission: RE | Admit: 2016-08-01 | Discharge: 2016-08-01 | Disposition: A | Discharge status: Home / Self Care | Payer: PRIVATE HEALTH INSURANCE | Source: Ambulatory Visit | Attending: Family Medicine | Admitting: Family Medicine

## 2016-08-01 DIAGNOSIS — Z1231 Encounter for screening mammogram for malignant neoplasm of breast: Secondary | ICD-10-CM

## 2016-08-01 LAB — HM MAMMOGRAPHY

## 2017-05-10 LAB — BASIC METABOLIC PANEL
BUN: 16 (ref 4–21)
Creatinine: 0.9 (ref <=1.1)
Glucose: 87
Potassium: 4.3 (ref 3.4–5.3)
Sodium: 137 (ref 137–147)

## 2017-05-10 LAB — MICROALBUMIN, URINE: Microalb, Ur: 0.91

## 2017-07-01 ENCOUNTER — Encounter: Payer: Self-pay | Admitting: *Deleted

## 2017-07-05 ENCOUNTER — Encounter: Payer: Self-pay | Admitting: *Deleted

## 2017-07-11 ENCOUNTER — Encounter: Payer: Self-pay | Admitting: Nurse Practitioner

## 2017-07-11 ENCOUNTER — Ambulatory Visit: Payer: Medicare Other | Admitting: Nurse Practitioner

## 2017-07-11 DIAGNOSIS — I10 Essential (primary) hypertension: Secondary | ICD-10-CM | POA: Diagnosis not present

## 2017-07-11 DIAGNOSIS — R5383 Other fatigue: Secondary | ICD-10-CM

## 2017-07-11 DIAGNOSIS — M810 Age-related osteoporosis without current pathological fracture: Secondary | ICD-10-CM | POA: Diagnosis not present

## 2017-07-11 DIAGNOSIS — K5901 Slow transit constipation: Secondary | ICD-10-CM | POA: Insufficient documentation

## 2017-07-11 DIAGNOSIS — M159 Polyosteoarthritis, unspecified: Secondary | ICD-10-CM

## 2017-07-11 DIAGNOSIS — R221 Localized swelling, mass and lump, neck: Secondary | ICD-10-CM | POA: Insufficient documentation

## 2017-07-11 DIAGNOSIS — R531 Weakness: Secondary | ICD-10-CM | POA: Insufficient documentation

## 2017-07-11 DIAGNOSIS — N3942 Incontinence without sensory awareness: Secondary | ICD-10-CM | POA: Diagnosis not present

## 2017-07-11 DIAGNOSIS — K5900 Constipation, unspecified: Secondary | ICD-10-CM

## 2017-07-11 DIAGNOSIS — R32 Unspecified urinary incontinence: Secondary | ICD-10-CM | POA: Insufficient documentation

## 2017-07-11 DIAGNOSIS — F320 Major depressive disorder, single episode, mild: Secondary | ICD-10-CM

## 2017-07-11 NOTE — Assessment & Plan Note (Signed)
Continue Vit D, Boniva, update Vit D level.

## 2017-07-11 NOTE — Assessment & Plan Note (Signed)
Clinical depression since her age of 24s, worsened since her husband passed away a year ago, she has early am awake, difficulty returning to sleep, Melatonin helps sometimes, saw Psychiatrist in the past, but not wanting to return to the service, continue Wellbutrin  daily. Referral to psycho therapy.

## 2017-07-11 NOTE — Assessment & Plan Note (Signed)
Arthritic pain in lower back, left hip,  kyphosis, scoliosis, s/p cervical spine surgery by neurosurgeon about 4 years ago, continue  Motrin prn for pain.

## 2017-07-11 NOTE — Assessment & Plan Note (Signed)
Urinary incontinence, mostly at night, is not new, managed with adult depends.

## 2017-07-11 NOTE — Assessment & Plan Note (Signed)
C/o increased fatigue in the past 3-4 weeks, she denied headache, dizziness, change of vision, chest pain/palpitaion, SOB, cough, abd pain, nausea, vomiting, or tingling numbness in extremities. Update CBC CMP TSH

## 2017-07-11 NOTE — Progress Notes (Signed)
Provider:  Chipper Oman NP Location:   clinic [facility]   Place of Service:   clinic [facility]  PCP: Gweneth Dimitri, MD Patient Care Team: Gweneth Dimitri, MD as PCP - [facility])  Extended Emergency Contact Information Primary Emergency Contact: Edward Jolly Address: [address]          [place], [place] [address] [place] Home Phone: [phone] Mobile Phone: [phone] Relation: Spouse Secondary Emergency Contact: Aline Brochure [place] of [place] Mobile Phone: [phone] Relation: Daughter  Code Status: DNR Goals of Care: Advanced Directive information Advanced Directives 10/23/2012  Does Patient Have a Medical Advance Directive? -  Type of Advance Directive -  Copy of Healthcare Power of Attorney in Chart? -  Pre-existing out of facility DNR order (yellow form or pink MOST form) No      Chief Complaint  Patient presents with  . Establish Care    HPI: Patient is a 83 y.o. female seen today for admission to clinic [facility]   The patient has history of HTN, blood pressure is controlled per patient, but its elevated today in clinic, she takes  Metoprolol 12.5mg  bid. Clinical depression since her age of 13s, worsened since her husband passed away a year ago, she has early am awake, difficulty returning to sleep, Melatonin helps sometimes, saw Psychiatrist in the past, but not wanting to return to the service, on Wellbutrin  daily. Chronic constipation is managed on Linzess. Arthritic pain in lower back, left hip,  kyphosis, scoliosis, s/p cervical spine surgery by neurosurgeon about 4 years ago, on Motrin prn for pain. C/o increased fatigue in the past 3-4 weeks, she denied headache, dizziness, change of vision, chest pain/palpitaion, SOB, cough, abd pain, nausea, vomiting, or tingling numbness in extremities. Urinary incontinence, mostly at night, is not new, managed with adult depends.   Past Medical History:  Diagnosis Date  . Anginal pain (HCC)  08/24/2010   Echo-EF =>55%,LV normal  . Arthritis   . Cataract 2008   Both eyes  Epes MD  . Chest pain, unspecified 09/04/2007   Lexiscan-- EF 72%; LV normal  . Constipation    Takes flax seed and honey .  Smooth Move tea.  . Depression   . Diverticulitis   . Head injury, closed    with fall  . Hypertension   . Mitral valve prolapse   . Osteopenia    Past Surgical History:  Procedure Laterality Date  . ANTERIOR CERVICAL DECOMP/DISCECTOMY FUSION N/A 10/23/2012   Procedure: Cervical Five to Cervical Six, Cervical Six to Cervical Seven anterior cervical decompression with fusion plating and bonegraft;  Surgeon: Hewitt Shorts, MD;  Location: [facility];  Service: Neurosurgery;  Laterality: N/A;  ANTERIOR CERVICAL DECOMPRESSION/DISCECTOMY FUSION 2 LEVELS  . BREAST CYST ASPIRATION  1990   benign  . CARDIAC CATHETERIZATION  08/01/2010   normal coronaries  . COLONOSCOPY  approx 2011  . EYE SURGERY Bilateral 2011  . intestinal blockage surgery  Feb. 2012   "kink in small intestine" per pt    reports that she has never smoked. She has never used smokeless tobacco. She reports that she drinks about 2.4 oz of alcohol per week. She reports that she does not use drugs. Social History   Socioeconomic History  . Marital status: Married    Spouse name: Joe  . Number of children: 2  . Years of education: college  . Highest education level: Bachelor's degree (e.g., BA, AB, BS)  Occupational History  .  Not on file  Social Needs  . Financial resource strain: Not on file  . Food insecurity:    Worry: Not on file    Inability: Not on file  . Transportation needs:    Medical: Not on file    Non-medical: Not on file  Tobacco Use  . Smoking status: Never Smoker  . Smokeless tobacco: Never Used  Substance and Sexual Activity  . Alcohol use: Yes    Alcohol/week: 2.4 oz    Types: 2 Glasses of wine, 2 Cans of beer per week    Comment: occasionally  . Drug use: No  . Sexual activity:  Yes  Lifestyle  . Physical activity:    Days per week: Not on file    Minutes per session: Not on file  . Stress: Not on file  Relationships  . Social connections:    Talks on phone: Not on file    Gets together: Not on file    Attends religious service: Not on file    Active member of club or organization: Not on file    Attends meetings of clubs or organizations: Not on file    Relationship status: Not on file  . Intimate partner violence:    Fear of current or ex partner: Not on file    Emotionally abused: Not on file    Physically abused: Not on file    Forced sexual activity: Not on file  Other Topics Concern  . Not on file  Social History Narrative   Diet:  Normal   Do you drink/eat things with caffeine?   Yes   Marital status:  Widow                            What year were you married? 1986 and 1960   Do you live in a house, apartment, assisted living, condo, trailer, etc)?  Apartment   Is it one or more stories? 1+   How many persons live in your home?  1   Do you have any pets in your home?  No   Current or past profession:  Runner, broadcasting/film/video, Environmental health practitioner at [employer] you exercise?   Yes                                                 Type & how often: Stretching,  Strengthening   Do you have a living will?  Yes   Do you have a DNR Form?   Do you have a POA/HPOA forms?  HPOA    Functional Status Survey:    Family History  Problem Relation Age of Onset  . Hypertension Mother   . Transient ischemic attack Mother   . Parkinson's disease Mother   . Dementia Mother   . Diabetes Father   . Coronary artery disease Father   . Heart failure Father   . Prostate cancer Brother   . Diabetes Brother   . Prostate cancer Paternal Grandmother     Health Maintenance  Topic Date Due  . Janet Berlin  11/27/1953  . PNA vac Low Risk Adult (1 of 2 - PCV13) 11/26/2001  . INFLUENZA VACCINE  10/03/2017  . DEXA SCAN  Completed    Allergies  Allergen Reactions  .  Contrast Media [Iodinated Diagnostic Agents] Hives  Hives during IVP at urology office '02, requires 13 hr prep///[email or web address]  Patient came thru ER and had a 1 hour pre-medication and did fine  . Oxycodone Other (See Comments)    Decreased appetite.  . Codeine Diarrhea and Nausea And Vomiting  . Darvocet [Propoxyphene N-Acetaminophen] Diarrhea and Nausea And Vomiting  . Erythromycin Nausea And Vomiting    Allergies as of 07/11/2017      Reactions   Contrast Media [iodinated Diagnostic Agents] Hives   Hives during IVP at urology office '02, requires 13 hr prep///[email or web address] Patient came thru ER and had a 1 hour pre-medication and did fine   Oxycodone Other (See Comments)   Decreased appetite.   Codeine Diarrhea, Nausea And Vomiting   Darvocet [propoxyphene N-acetaminophen] Diarrhea, Nausea And Vomiting   Erythromycin Nausea And Vomiting      Medication List        Accurate as of 07/11/17 11:59 PM. Always use your most recent med list.          buPROPion 150 MG 24 hr tablet Commonly known as:  WELLBUTRIN XL Take 150 mg by mouth daily with breakfast.   ibandronate 150 MG tablet Commonly known as:  BONIVA Take 150 mg by mouth every 30 (thirty) days. Take in the morning with a full glass of water, on an empty stomach, and do not take anything else by mouth or lie down for the next 30 min.   linaclotide 145 MCG Caps capsule Commonly known as:  LINZESS Take 145 mcg by mouth daily before breakfast.   metoprolol tartrate 25 MG tablet Commonly known as:  LOPRESSOR Take 12.5 mg by mouth 2 (two) times daily.   multivitamin with minerals Tabs tablet Take 1 tablet by mouth daily.   traMADol 50 MG tablet Commonly known as:  ULTRAM Take 1 tablet (50 mg total) by mouth every 6 (six) hours as needed for severe pain.   Vitamin D-3 5000 units Tabs Take 5,000 Units by mouth 3 (three) times a week. Takes on Monday, Wednesday, and Friday       Review of Systems  Constitutional:  Positive for fatigue. Negative for activity change, appetite change, chills, diaphoresis and fever.  HENT: Negative for congestion, ear pain, hearing loss, sinus pressure, sore throat, trouble swallowing and voice change.   Eyes: Negative for visual disturbance.  Respiratory: Negative for cough, shortness of breath and wheezing.   Cardiovascular: Negative for chest pain, palpitations and leg swelling.  Gastrointestinal: Negative for abdominal distention, abdominal pain, constipation, diarrhea, nausea and vomiting.  Genitourinary: Negative for difficulty urinating, dysuria and urgency.       Incontinent urine  Musculoskeletal: Positive for arthralgias, back pain and gait problem.  Skin: Negative for color change and pallor.  Neurological: Negative for dizziness, syncope, speech difficulty, weakness, light-headedness, numbness and headaches.  Psychiatric/Behavioral: Positive for sleep disturbance. Negative for agitation, behavioral problems, confusion, decreased concentration, dysphoric mood and hallucinations. The patient is not nervous/anxious.        Hx of depression.     Vitals:   07/11/17 1436  BP: (!) 160/100  Pulse: (!) 57  Resp: 18  Temp: (!) 97.4 F (36.3 C)  TempSrc: Oral  SpO2: 98%  Weight: 147 lb 6.4 oz (66.9 kg)  Height:  (1.676 m)   Body mass index is 23.79 kg/m. Physical Exam  Constitutional: She is oriented to person, place, and time. She appears well-developed and well-nourished.  HENT:  Head: Normocephalic and atraumatic.  Mouth/Throat: Oropharynx is clear and  moist.  Eyes: Pupils are equal, round, and reactive to light. EOM are normal.  Neck: Normal range of motion. Neck supple. No JVD present. No thyromegaly present.   A marble sized smooth lump at the medial sternoclavicular mastoid muscle.   Cardiovascular: Normal rate and regular rhythm.  No murmur heard. Pulmonary/Chest: Effort normal and breath sounds normal. She has no wheezes. She has no rales.    Abdominal: Soft. Bowel sounds are normal. She exhibits no distension and no mass. There is no tenderness. There is no rebound.  Musculoskeletal: She exhibits tenderness. She exhibits no edema.  Chronic lower back pain, left hip pain, ambulates with walker. Kyphosis, scoliosis.   Lymphadenopathy:    She has cervical adenopathy.  Neurological: She is alert and oriented to person, place, and time. No cranial nerve deficit or sensory deficit. She exhibits normal muscle tone. Coordination normal.  Skin: No rash noted.  Psychiatric: She has a normal mood and affect. Her behavior is normal. Judgment and thought content normal.    Labs reviewed: Basic Metabolic Panel: No results for input(s): NA, K, CL, CO2, GLUCOSE, BUN, CREATININE, CALCIUM, MG, PHOS in the last 8760 hours. Liver Function Tests: No results for input(s): AST, ALT, ALKPHOS, BILITOT, PROT, ALBUMIN in the last 8760 hours. No results for input(s): LIPASE, AMYLASE in the last 8760 hours. No results for input(s): AMMONIA in the last 8760 hours. CBC: No results for input(s): WBC, NEUTROABS, HGB, HCT, MCV, PLT in the last 8760 hours. Cardiac Enzymes: No results for input(s): CKTOTAL, CKMB, CKMBINDEX, TROPONINI in the last 8760 hours. BNP: Invalid input(s): POCBNP Lab Results  Component Value Date   HGBA1C  08/01/2010    5.4 (NOTE)                                                                       According to the ADA Clinical Practice Recommendations for 2011, when HbA1c is used as a screening test:   >=6.5%   Diagnostic of Diabetes Mellitus           (if abnormal result  is confirmed)  5.7-6.4%   Increased risk of developing Diabetes Mellitus  References:Diagnosis and Classification of Diabetes Mellitus,Diabetes Care,2011,34(Suppl 1):S62-S69 and Standards of Medical Care in         Diabetes - 2011,Diabetes Care,2011,34  (Suppl 1):S11-S61.   Lab Results  Component Value Date   TSH 6.196 (H) 08/01/2010   No results found for:  VITAMINB12 No results found for: FOLATE No results found for: IRON, TIBC, FERRITIN  Imaging and Procedures obtained prior to SNF admission: Mm Digital Screening Bilateral  Result Date: 08/01/2016 CLINICAL DATA:  Screening. EXAM: DIGITAL SCREENING BILATERAL MAMMOGRAM WITH CAD COMPARISON:  Previous exam(s). ACR Breast Density Category b: There are scattered areas of fibroglandular density. FINDINGS: There are no findings suspicious for malignancy. Images were processed with CAD. IMPRESSION: No mammographic evidence of malignancy. A result letter of this screening mammogram will be mailed directly to the patient. RECOMMENDATION: Screening mammogram in one year. (Code:SM-B-01Y) BI-RADS CATEGORY  1: Negative. Electronically Signed   By: Gerome Sam III M.D   On: 08/01/2016 17:21    Assessment/Plan  Essential hypertension, benign HTN, blood pressure is elevated today, she is asymptomatic, continue Metoprolol 12.5mg   bid. Monitor BP at home, call for persisted BP or s/s of HTN. Update lipid panel.   Depression Clinical depression since her age of 27s, worsened since her husband passed away a year ago, she has early am awake, difficulty returning to sleep, Melatonin helps sometimes, saw Psychiatrist in the past, but not wanting to return to the service, continue Wellbutrin 150mg  daily. Referral to psycho therapy.   Constipation Chronic constipation is managed, continue  Linzess.   Generalized osteoarthritis of multiple sites Arthritic pain in lower back, left hip,  kyphosis, scoliosis, s/p cervical spine surgery by neurosurgeon about 4 years ago, continue  Motrin prn for pain.   Fatigue C/o increased fatigue in the past 3-4 weeks, she denied headache, dizziness, change of vision, chest pain/palpitaion, SOB, cough, abd pain, nausea, vomiting, or tingling numbness in extremities. Update CBC CMP TSH  Osteoporosis  Continue Vit D, Boniva, update Vit D level.   Incontinent of urine Urinary  incontinence, mostly at night, is not new, managed with adult depends.    Localized swelling, mass or lump of neck Left neck a smooth marble sized lump medial side of the sternoclavicular mastoid muscle, duration is unknown, she denied pain. Has history or cervical spine surgery about 4 years ago, ?scar tissue, will observe.    Family/ staff Communication: plan of care reviewed with the patient   Labs/tests ordered: CBC CMP TSH lipid panel Vit D level.  Time spend 25 minutes.

## 2017-07-11 NOTE — Assessment & Plan Note (Addendum)
HTN, blood pressure is elevated today, she is asymptomatic, continue Metoprolol 12.5mg  bid. Monitor BP at home, call for persisted BP or s/s of HTN. Update lipid panel.

## 2017-07-11 NOTE — Assessment & Plan Note (Signed)
Chronic constipation is managed, continue  Linzess.

## 2017-07-11 NOTE — Assessment & Plan Note (Signed)
Left neck a smooth marble sized lump medial side of the sternoclavicular mastoid muscle, duration is unknown, she denied pain. Has history or cervical spine surgery about 4 years ago, ?scar tissue, will observe.

## 2017-07-11 NOTE — Patient Instructions (Signed)
Will refer to psycho therapy for depression management. Obtain labs for fatigue workups. F/u in Clinic in 3 months with Dr. Glade Lloyd.

## 2017-07-16 ENCOUNTER — Other Ambulatory Visit: Payer: Medicare Other

## 2017-07-16 DIAGNOSIS — M810 Age-related osteoporosis without current pathological fracture: Secondary | ICD-10-CM

## 2017-07-16 DIAGNOSIS — R5383 Other fatigue: Secondary | ICD-10-CM

## 2017-07-16 DIAGNOSIS — I10 Essential (primary) hypertension: Secondary | ICD-10-CM

## 2017-07-17 ENCOUNTER — Telehealth: Payer: Self-pay | Admitting: *Deleted

## 2017-07-17 LAB — COMPLETE METABOLIC PANEL WITH GFR
AG Ratio: 1.8 (calc) (ref 1.0–2.5)
ALT: 15 U/L (ref 6–29)
AST: 24 U/L (ref 10–35)
Albumin: 4.5 g/dL (ref 3.6–5.1)
Alkaline phosphatase (APISO): 57 U/L (ref 33–130)
BUN: 14 mg/dL (ref 7–25)
CO2: 31 mmol/L (ref 20–32)
Calcium: 9.7 mg/dL (ref 8.6–10.4)
Chloride: 106 mmol/L (ref 98–110)
Creat: 0.88 mg/dL (ref 0.60–0.88)
GFR, Est African American: 72 mL/min/{1.73_m2} (ref >=60)
GFR, Est Non African American: 62 mL/min/{1.73_m2} (ref >=60)
Globulin: 2.5 g/dL (calc) (ref 1.9–3.7)
Glucose, Bld: 93 mg/dL (ref 65–99)
Potassium: 3.8 mmol/L (ref 3.5–5.3)
Sodium: 141 mmol/L (ref 135–146)
Total Bilirubin: 0.5 mg/dL (ref 0.2–1.2)
Total Protein: 7 g/dL (ref 6.1–8.1)

## 2017-07-17 LAB — LIPID PANEL
Cholesterol: 169 mg/dL (ref <200)
HDL: 53 mg/dL (ref >50)
LDL Cholesterol (Calc): 98 mg/dL (calc)
Non-HDL Cholesterol (Calc): 116 mg/dL (calc) (ref <130)
Total CHOL/HDL Ratio: 3.2 (calc) (ref <5.0)
Triglycerides: 89 mg/dL (ref <150)

## 2017-07-17 LAB — VITAMIN D 25 HYDROXY (VIT D DEFICIENCY, FRACTURES): Vit D, 25-Hydroxy: 49 ng/mL (ref 30–100)

## 2017-07-17 LAB — CBC
HCT: 40.5 % (ref 35.0–45.0)
Hemoglobin: 14 g/dL (ref 11.7–15.5)
MCH: 31.5 pg (ref 27.0–33.0)
MCHC: 34.6 g/dL (ref 32.0–36.0)
MCV: 91.2 fL (ref 80.0–100.0)
MPV: 9.8 fL (ref 7.5–12.5)
Platelets: 277 10*3/uL (ref 140–400)
RBC: 4.44 10*6/uL (ref 3.80–5.10)
RDW: 12.1 % (ref 11.0–15.0)
WBC: 5.4 10*3/uL (ref 3.8–10.8)

## 2017-07-17 LAB — TSH: TSH: 3.12 mIU/L (ref 0.40–4.50)

## 2017-07-17 NOTE — Telephone Encounter (Signed)
Spoke with Ms Adriana [name] regarding this patient's referral, she stated that she is going on vacation and will give her a call when she returns on 07/22/2017. The script for the referral is still on my desk.

## 2017-08-01 ENCOUNTER — Ambulatory Visit: Payer: Medicare Other | Admitting: Nurse Practitioner

## 2017-08-01 ENCOUNTER — Encounter: Payer: Self-pay | Admitting: Nurse Practitioner

## 2017-08-01 DIAGNOSIS — M810 Age-related osteoporosis without current pathological fracture: Secondary | ICD-10-CM

## 2017-08-01 DIAGNOSIS — F320 Major depressive disorder, single episode, mild: Secondary | ICD-10-CM

## 2017-08-01 DIAGNOSIS — K5900 Constipation, unspecified: Secondary | ICD-10-CM

## 2017-08-01 DIAGNOSIS — I10 Essential (primary) hypertension: Secondary | ICD-10-CM

## 2017-08-01 NOTE — Assessment & Plan Note (Signed)
elevated Bp 180s/100s about 3 days ago, asymptomatic, her Metoprolol was increased  bid/12.5mg  bid subsequently. Blood pressure was normalized today, 140/33mmHg.

## 2017-08-01 NOTE — Assessment & Plan Note (Signed)
Hx of depression: continue Wellbutroin  daily. She likes to talk to a therapist, no recent falls or fractures.

## 2017-08-01 NOTE — Assessment & Plan Note (Addendum)
DEXA: done 2-3 weeks ago, continue  Boniva and Vit D. May consider endocrinology if the patient desires. Per Dr. Corliss Blacker previous PCP: inconsistent use of fosamax and then Ibandronate, she has not been on Ca supplement since it caused her constipation. She stated she is willing to try Ca again since she has no constipation.

## 2017-08-01 NOTE — Progress Notes (Addendum)
Location:   clinic [facility]   Place of Service:   clinic [facility]  Provider: Chipper Oman NP  Code Status: DNR Goals of Care: [place] 10/23/2012  Does Patient Have a Medical Advance Directive? -  Type of Advance Directive -  Copy of Healthcare Power of Attorney in Chart? -  Pre-existing out of facility DNR order (yellow form or pink MOST form) No     Chief Complaint  Patient presents with  . Acute Visit    Elevated BP    HPI: Patient is a 83 y.o. female seen today for an acute visit for elevated Bp 180s/100s about 3 days ago, asymptomatic, her Metoprolol was increased  bid/12.5mg  bid subsequently. Blood pressure was normalized today, 140/68mmHg. DEXA: done 2-3 weeks ago, on Boniva and Vit D. Hx of depression: on Wellbutroin  daily. She likes to talk to a therapist, no recent falls or fractures.    Past Medical History:  Diagnosis Date  . Anginal pain (HCC) 08/24/2010   Echo-EF =>55%,LV normal  . Arthritis   . Cataract 2008   Both eyes  Epes MD  . Chest pain, unspecified 09/04/2007   Lexiscan-- EF 72%; LV normal  . Constipation    Takes flax seed and honey .  Smooth Move tea.  . Depression   . Diverticulitis   . Head injury, closed    with fall  . Hypertension   . Mitral valve prolapse   . Osteopenia     Past Surgical History:  Procedure Laterality Date  . ANTERIOR CERVICAL DECOMP/DISCECTOMY FUSION N/A 10/23/2012   Procedure: Cervical Five to Cervical Six, Cervical Six to Cervical Seven anterior cervical decompression with fusion plating and bonegraft;  Surgeon: Hewitt Shorts, MD;  Location: [facility];  Service: Neurosurgery;  Laterality: N/A;  ANTERIOR CERVICAL DECOMPRESSION/DISCECTOMY FUSION 2 LEVELS  . BREAST CYST ASPIRATION  1990   benign  . CARDIAC CATHETERIZATION  08/01/2010   normal coronaries  . COLONOSCOPY  approx 2011  . EYE SURGERY Bilateral 2011  . intestinal blockage surgery  Feb. 2012   "kink in small intestine" per pt     Allergies  Allergen Reactions  . Contrast Media [Iodinated Diagnostic Agents] Hives    Hives during IVP at urology office '02, requires 13 hr prep///a.calhoun  Patient came thru ER and had a 1 hour pre-medication and did fine  . Oxycodone Other (See Comments)    Decreased appetite.  . Codeine Diarrhea and Nausea And Vomiting  . Darvocet [Propoxyphene N-Acetaminophen] Diarrhea and Nausea And Vomiting  . Erythromycin Nausea And Vomiting    Allergies as of 08/01/2017      Reactions   Contrast Media [iodinated Diagnostic Agents] Hives   Hives during IVP at urology office '02, requires 13 hr prep///a.calhoun Patient came thru ER and had a 1 hour pre-medication and did fine   Oxycodone Other (See Comments)   Decreased appetite.   Codeine Diarrhea, Nausea And Vomiting   Darvocet [propoxyphene N-acetaminophen] Diarrhea, Nausea And Vomiting   Erythromycin Nausea And Vomiting      Medication List        Accurate as of 08/01/17 11:59 PM. Always use your most recent med list.          amoxicillin 500 MG capsule Commonly known as:  AMOXIL Take 1 tablet before dental appointment.   buPROPion 150 MG 24 hr tablet Commonly known as:  WELLBUTRIN XL Take 150 mg by mouth daily with breakfast.   calcium carbonate 600  MG Tabs tablet Commonly known as:  OS-CAL Take 1 tablet (600 mg total) by mouth 2 (two) times daily with a meal.   ibandronate 150 MG tablet Commonly known as:  BONIVA Take 150 mg by mouth every 30 (thirty) days. Take in the morning with a full glass of water, on an empty stomach, and do not take anything else by mouth or lie down for the next 30 min.   linaclotide 145 MCG Caps capsule Commonly known as:  LINZESS Take 145 mcg by mouth daily before breakfast.   metoprolol tartrate 25 MG tablet Commonly known as:  LOPRESSOR Take 25 mg by mouth 2 (two) times daily.   multivitamin with minerals Tabs tablet Take 1 tablet by mouth daily.   traMADol 50 MG  tablet Commonly known as:  ULTRAM Take 1 tablet (50 mg total) by mouth every 6 (six) hours as needed for severe pain.   Vitamin D-3 5000 units Tabs Take 5,000 Units by mouth 3 (three) times a week. Takes on Monday, Wednesday, and Friday       Review of Systems:  Review of Systems  Constitutional: Negative for activity change, appetite change, chills, diaphoresis, fatigue and fever.  HENT: Positive for hearing loss. Negative for congestion, trouble swallowing and voice change.   Eyes: Negative for visual disturbance.  Respiratory: Negative for cough, chest tightness, shortness of breath and wheezing.   Cardiovascular: Negative for chest pain, palpitations and leg swelling.  Gastrointestinal: Negative for abdominal distention, abdominal pain, constipation, diarrhea, nausea and vomiting.  Genitourinary: Negative for difficulty urinating, dysuria and urgency.       Urinary leakage.   Musculoskeletal: Positive for back pain and gait problem.  Skin: Negative for color change and pallor.  Neurological: Negative for dizziness, tremors, facial asymmetry, speech difficulty, weakness, light-headedness, numbness and headaches.  Psychiatric/Behavioral: Positive for sleep disturbance. Negative for agitation, behavioral problems, confusion and hallucinations. The patient is nervous/anxious.     Health Maintenance  Topic Date Due  . TETANUS/TDAP  11/27/1953  . PNA vac Low Risk Adult (1 of 2 - PCV13) 11/26/2001  . INFLUENZA VACCINE  10/03/2017  . DEXA SCAN  Completed    Physical Exam: Vitals:   08/01/17 1440  BP: 140/80  Pulse: (!) 59  Resp: 20  Temp: 98.3 F (36.8 C)  SpO2: 96%  Weight: 147 lb (66.7 kg)  Height:  (1.676 m)   Body mass index is 23.73 kg/m. Physical Exam  Constitutional: She is oriented to person, place, and time. She appears well-developed and well-nourished.  HENT:  Head: Normocephalic and atraumatic.  Eyes: Pupils are equal, round, and reactive to light. EOM  are normal.  Neck: Normal range of motion. Neck supple. No JVD present. No thyromegaly present.  Cardiovascular: Normal rate and regular rhythm.  No murmur heard. Pulmonary/Chest: Effort normal and breath sounds normal. She has no wheezes. She has no rales.  Abdominal: Soft. Bowel sounds are normal. She exhibits no distension. There is no tenderness.  Musculoskeletal: She exhibits tenderness. She exhibits no edema.  Ambulates with walker. Lower back and left hip pain, chronic.   Neurological: She is alert and oriented to person, place, and time. No cranial nerve deficit. She exhibits normal muscle tone. Coordination normal.  Skin: Skin is warm and dry.  Psychiatric: She has a normal mood and affect. Her behavior is normal. Judgment and thought content normal.    Labs reviewed: Basic Metabolic Panel: Recent Labs    07/16/17 0000  NA 141  K 3.8  CL 106  CO2 31  GLUCOSE 93  BUN 14  CREATININE 0.88  CALCIUM 9.7  TSH 3.12   Liver Function Tests: Recent Labs    07/16/17 0000  AST 24  ALT 15  BILITOT 0.5  PROT 7.0   No results for input(s): LIPASE, AMYLASE in the last 8760 hours. No results for input(s): AMMONIA in the last 8760 hours. CBC: Recent Labs    07/16/17 0000  WBC 5.4  HGB 14.0  HCT 40.5  MCV 91.2  PLT 277   Lipid Panel: Recent Labs    07/16/17 0000  CHOL 169  HDL 53  LDLCALC 98  TRIG 89  CHOLHDL 3.2   Lab Results  Component Value Date   HGBA1C  08/01/2010    5.4 (NOTE)                                                                       According to the ADA Clinical Practice Recommendations for 2011, when HbA1c is used as a screening test:   >=6.5%   Diagnostic of Diabetes Mellitus           (if abnormal result  is confirmed)  5.7-6.4%   Increased risk of developing Diabetes Mellitus  References:Diagnosis and Classification of Diabetes Mellitus,Diabetes Care,2011,34(Suppl 1):S62-S69 and Standards of Medical Care in         Diabetes - 2011,Diabetes  Care,2011,34  (Suppl 1):S11-S61.    Procedures since last visit: No results found.  Assessment/Plan Essential hypertension, benign elevated Bp 180s/100s about 3 days ago, asymptomatic, her Metoprolol was increased  bid/12.5mg  bid subsequently. Blood pressure was normalized today, 140/94mmHg.   Osteoporosis DEXA: done 2-3 weeks ago, continue  Boniva and Vit D. May consider endocrinology if the patient desires. Per Dr. Corliss Blacker previous PCP: inconsistent use of fosamax and then Ibandronate, she has not been on Ca supplement since it caused her constipation. She stated she is willing to try Ca again since she has no constipation.   Depression  Hx of depression: continue Wellbutroin  daily. She likes to talk to a therapist, no recent falls or fractures.    Constipation Stable, continue Linzess.     Labs/tests ordered:  None  Time spend 25 minutes.   Plan of care reviewed with the patient, instructed to her start Ca  po bid.   Next appt:  10/15/2017

## 2017-08-05 ENCOUNTER — Encounter: Payer: Self-pay | Admitting: Nurse Practitioner

## 2017-08-07 ENCOUNTER — Telehealth: Payer: Self-pay | Admitting: *Deleted

## 2017-08-07 MED ORDER — CALCIUM CARBONATE 600 MG PO TABS: 600.0000 mg | ORAL_TABLET | Freq: Two times a day (BID) | ORAL | 5 refills | Status: DC

## 2017-08-07 NOTE — Telephone Encounter (Signed)
[facility] for her to start taking due too osteoporosis. She stated that she understood and will began taking it.

## 2017-08-07 NOTE — Addendum Note (Signed)
Addended by: Mickie Badders X on: 08/07/2017 11:33 AM   Modules accepted: Orders

## 2017-08-07 NOTE — Assessment & Plan Note (Signed)
Stable, continue Linzess.

## 2017-08-22 ENCOUNTER — Other Ambulatory Visit: Payer: Self-pay

## 2017-08-22 MED ORDER — METOPROLOL TARTRATE 25 MG PO TABS: 25.0000 mg | ORAL_TABLET | Freq: Two times a day (BID) | ORAL | 2 refills | Status: DC

## 2017-08-27 ENCOUNTER — Encounter: Payer: Self-pay | Admitting: Internal Medicine

## 2017-08-27 ENCOUNTER — Non-Acute Institutional Stay: Payer: Medicare Other | Admitting: Internal Medicine

## 2017-08-27 VITALS — BP 136/70 | HR 63 | Temp 98.4°F | Resp 18 | Ht 66.0 in | Wt 145.8 lb

## 2017-08-27 DIAGNOSIS — T50905D Adverse effect of unspecified drugs, medicaments and biological substances, subsequent encounter: Secondary | ICD-10-CM | POA: Diagnosis not present

## 2017-08-27 DIAGNOSIS — M810 Age-related osteoporosis without current pathological fracture: Secondary | ICD-10-CM | POA: Diagnosis not present

## 2017-08-27 MED ORDER — ALENDRONATE SODIUM 70 MG PO TABS
70.0000 mg | ORAL_TABLET | ORAL | 11 refills | Status: DC
Start: 2017-08-27 — End: 2017-10-15

## 2017-08-27 NOTE — Progress Notes (Signed)
Location:      Place of Service:    Provider:  Oneal Grout MD  Oneal Grout, MD  Patient Care Team: Oneal Grout, MD as PCP - General (Internal Medicine) [name], [name], NP as Nurse Practitioner (Internal Medicine)  Extended Emergency Contact Information Primary Emergency Contact: Edward Jolly Address: [address]          [place], [place] [address] [place] Home Phone: [phone] Mobile Phone: [phone] Relation: Spouse Secondary Emergency Contact: Aline Brochure [place] Mobile Phone: [phone] Relation: Daughter  Goals of care: Advanced Directive information Advanced Directives 08/27/2017  Does Patient Have a Medical Advance Directive? Yes  Type of Advance Directive Living will  Does patient want to make changes to medical advance directive? No - Patient declined  Copy of Healthcare Power of Attorney in Chart? -  Pre-existing out of facility DNR order (yellow form or pink MOST form) -     Chief Complaint  Patient presents with  . Allergic Reaction    Patient stated that she may have had a allergic reaction to Boniva. She mentioned that she had some diarrhea, vomiting, some weakness, feeling jittery. She also mentioned that she had some nausea for 3 days after taking the medication.     HPI:  Pt is a 83 y.o. female seen today for acute visit. She has been on boniva for few months, she tolerated it well the first two times. The third dose was recent and she started feeling weak and jittery and had loose stool and vomiting 2-3 days after taking boniva. She is concerned about this and would like to come off it. On further questioning, she had taken 3 doses of boniva before as well and self stopped it for several months because she felt the dose was too strong for her. Denies any symptom this visit.    Past Medical History:  Diagnosis Date  . Anginal pain (HCC) 08/24/2010   Echo-EF =>55%,LV normal  . Arthritis   . Cataract 2008     Both eyes  Epes MD  . Chest pain, unspecified 09/04/2007   Lexiscan-- EF 72%; LV normal  . Constipation    Takes flax seed and honey .  Smooth Move tea.  . Depression   . Diverticulitis   . Head injury, closed    with fall  . Hypertension   . Mitral valve prolapse   . Osteopenia    Past Surgical History:  Procedure Laterality Date  . ANTERIOR CERVICAL DECOMP/DISCECTOMY FUSION N/A 10/23/2012   Procedure: Cervical Five to Cervical Six, Cervical Six to Cervical Seven anterior cervical decompression with fusion plating and bonegraft;  Surgeon: Hewitt Shorts, MD;  Location: [facility];  Service: Neurosurgery;  Laterality: N/A;  ANTERIOR CERVICAL DECOMPRESSION/DISCECTOMY FUSION 2 LEVELS  . BREAST CYST ASPIRATION  1990   benign  . CARDIAC CATHETERIZATION  08/01/2010   normal coronaries  . COLONOSCOPY  approx 2011  . EYE SURGERY Bilateral 2011  . intestinal blockage surgery  Feb. 2012   "kink in small intestine" per pt    Allergies  Allergen Reactions  . Contrast Media [Iodinated Diagnostic Agents] Hives    Hives during IVP at urology office '02, requires 13 hr prep///a.calhoun  Patient came thru ER and had a 1 hour pre-medication and did fine  . Oxycodone Other (See Comments)    Decreased appetite.  Sandrea Hammond [Ibandronic Acid]     Nausea, vomiting, weakness  . Codeine Diarrhea and Nausea And  Vomiting  . Darvocet [Propoxyphene N-Acetaminophen] Diarrhea and Nausea And Vomiting  . Erythromycin Nausea And Vomiting    Outpatient Encounter Medications as of 08/27/2017  Medication Sig  . acetaminophen (TYLENOL) 325 MG tablet Take 650 mg by mouth as needed (back pain).  Marland Kitchen. amoxicillin (AMOXIL) 500 MG capsule Take 1 tablet before dental appointment.  Marland Kitchen. buPROPion (WELLBUTRIN XL) 150 MG 24 hr tablet Take 150 mg by mouth daily with breakfast.   . calcium carbonate (OS-CAL) 600 MG TABS tablet Take 1 tablet (600 mg total) by mouth 2 (two) times daily with a meal.  . Cholecalciferol  (VITAMIN D-3) 5000 UNITS TABS Take 5,000 Units by mouth 3 (three) times a week. Takes on Monday, Wednesday, and Friday  . linaclotide (LINZESS) 145 MCG CAPS capsule Take 145 mcg by mouth daily before breakfast.  . metoprolol tartrate (LOPRESSOR) 25 MG tablet Take 1 tablet (25 mg total) by mouth 2 (two) times daily.  . Multiple Vitamin (MULTIVITAMIN WITH MINERALS) TABS Take 1 tablet by mouth daily.  . traMADol (ULTRAM) 50 MG tablet Take 1 tablet (50 mg total) by mouth every 6 (six) hours as needed for severe pain.  . [DISCONTINUED] ibandronate (BONIVA) 150 MG tablet Take 150 mg by mouth every 30 (thirty) days. Take in the morning with a full glass of water, on an empty stomach, and do not take anything else by mouth or lie down for the next 30 min.  Marland Kitchen. alendronate (FOSAMAX) 70 MG tablet Take 1 tablet (70 mg total) by mouth every 7 (seven) days. Take with a full glass of water on an empty stomach.   No facility-administered encounter medications on file as of 08/27/2017.     Review of Systems  Constitutional: Negative for appetite change, chills and fever.  Respiratory: Negative for cough and shortness of breath.   Cardiovascular: Negative for palpitations.  Gastrointestinal: Negative for abdominal pain, constipation, diarrhea, nausea and vomiting.  Genitourinary: Negative for frequency.  Musculoskeletal: Positive for gait problem.  Skin: Negative for rash.  Neurological: Negative for dizziness.  Psychiatric/Behavioral: Negative for behavioral problems and confusion.    Immunization History  Administered Date(s) Administered  . Influenza, High Dose Seasonal PF 12/08/2016  . Zoster Recombinat (Shingrix) 01/08/2017   Pertinent  Health Maintenance Due  Topic Date Due  . PNA vac Low Risk Adult (1 of 2 - PCV13) 11/26/2001  . INFLUENZA VACCINE  10/03/2017  . DEXA SCAN  Completed   No flowsheet data found. Functional Status Survey:    Vitals:   08/27/17 1136  BP: 136/70  Pulse: 63    Resp: 18  Temp: 98.4 F (36.9 C)  TempSrc: Oral  SpO2: 93%  Weight: 145 lb 12.8 oz (66.1 kg)  Height: 5\' 6"  (1.676 m)   Body mass index is 23.53 kg/m. Physical Exam  Constitutional: She is oriented to person, place, and time. She appears well-developed and well-nourished. No distress.  HENT:  Head: Normocephalic and atraumatic.  Mouth/Throat: Oropharynx is clear and moist.  Eyes: Conjunctivae are normal.  Neck: Neck supple.  Cardiovascular: Normal rate and regular rhythm.  Pulmonary/Chest: Effort normal and breath sounds normal. She has no wheezes.  Abdominal: Soft. Bowel sounds are normal.  Musculoskeletal: Normal range of motion. She exhibits no edema or tenderness.  No spinal tenderness, unsteady gait.  Lymphadenopathy:    She has no cervical adenopathy.  Neurological: She is alert and oriented to person, place, and time.  Skin: Skin is warm and dry. She is not diaphoretic.  Psychiatric: She has a normal mood and affect.    Labs reviewed: Recent Labs    07/16/17 0000  NA 141  K 3.8  CL 106  CO2 31  GLUCOSE 93  BUN 14  CREATININE 0.88  CALCIUM 9.7   Recent Labs    07/16/17 0000  AST 24  ALT 15  BILITOT 0.5  PROT 7.0   Recent Labs    07/16/17 0000  WBC 5.4  HGB 14.0  HCT 40.5  MCV 91.2  PLT 277   Lab Results  Component Value Date   TSH 3.12 07/16/2017   Lab Results  Component Value Date   HGBA1C  08/01/2010    5.4 (NOTE)                                                                       According to the ADA Clinical Practice Recommendations for 2011, when HbA1c is used as a screening test:   >=6.5%   Diagnostic of Diabetes Mellitus           (if abnormal result  is confirmed)  5.7-6.4%   Increased risk of developing Diabetes Mellitus  References:Diagnosis and Classification of Diabetes Mellitus,Diabetes Care,2011,34(Suppl 1):S62-S69 and Standards of Medical Care in         Diabetes - 2011,Diabetes Care,2011,34  (Suppl 1):S11-S61.   Lab  Results  Component Value Date   CHOL 169 07/16/2017   HDL 53 07/16/2017   LDLCALC 98 07/16/2017   TRIG 89 07/16/2017   CHOLHDL 3.2 07/16/2017    Significant Diagnostic Results in last 30 days:  No results found.  Assessment/Plan 1. Osteoporosis without current pathological fracture, unspecified osteoporosis type Stop ibandronic acid. Start weekly fosamax as pt has tolerated this well in the past.  - alendronate (FOSAMAX) 70 MG tablet; Take 1 tablet (70 mg total) by mouth every 7 (seven) days. Take with a full glass of water on an empty stomach.  Dispense: 4 tablet; Refill: 11  2. Adverse effect of drug, subsequent encounter adverses symptom have now resolved. D/c boniva and monitor    Family/ staff Communication: reviewed care plan with patient   Labs/tests ordered:  none   Oneal Grout, MD Internal Medicine [facility] [address] [place], [place] [address] Cell Phone (Monday-Friday 8 am - 5 pm): [phone] On Call: [phone] and follow prompts after 5 pm and on weekends Office Phone: [phone] Office Fax: [phone]

## 2017-08-27 NOTE — Patient Instructions (Signed)
  Stop taking boniva  Start taking fosamax 70 mg once a week for now for your osteoporosis.

## 2017-09-02 ENCOUNTER — Telehealth: Payer: Self-pay | Admitting: Internal Medicine

## 2017-09-02 NOTE — Telephone Encounter (Signed)
I left a message asking the patient to call me at [phone] to [facility] if available. VDM (DD)

## 2017-09-03 ENCOUNTER — Non-Acute Institutional Stay: Payer: Medicare Other

## 2017-09-03 VITALS — BP 126/78 | HR 49 | Temp 97.9°F | Ht 66.0 in | Wt 147.0 lb

## 2017-09-03 DIAGNOSIS — Z0000 Encounter for general adult medical examination without abnormal findings: Secondary | ICD-10-CM

## 2017-09-03 NOTE — Patient Instructions (Signed)
[name] , Thank you for taking time to come for your Medicare Wellness Visit. I appreciate your ongoing commitment to your health goals. Please review the following plan we discussed and let me know if I can assist you in the future.   Screening recommendations/referrals: Colonoscopy excluded, over age 81 Mammogram excluded, over age 81 Bone Density up to date Recommended yearly ophthalmology/optometry visit for glaucoma screening and checkup Recommended yearly dental visit for hygiene and checkup  Vaccinations: Influenza vaccine up to date, due 2019 fall season  Pneumococcal vaccine up to date, completed Tdap vaccine up to date, due 07/28/2019 Shingles vaccine up to date, completed    Advanced directives: in chart  Conditions/risks identified: none  Next appointment: Dr. Glade LloydPandey 10/15/2017 @ 11am   Preventive Care 65 Years and Older, Female Preventive care refers to lifestyle choices and visits with your health care provider that can promote health and wellness. What does preventive care include?  A yearly physical exam. This is also called an annual well check.  Dental exams once or twice a year.  Routine eye exams. Ask your health care provider how often you should have your eyes checked.  Personal lifestyle choices, including:  Daily care of your teeth and gums.  Regular physical activity.  Eating a healthy diet.  Avoiding tobacco and drug use.  Limiting alcohol use.  Practicing safe sex.  Taking low-dose aspirin every day.  Taking vitamin and mineral supplements as recommended by your health care provider. What happens during an annual well check? The services and screenings done by your health care provider during your annual well check will depend on your age, overall health, lifestyle risk factors, and family history of disease. Counseling  Your health care provider may ask you questions about your:  Alcohol use.  Tobacco use.  Drug use.  Emotional  well-being.  Home and relationship well-being.  Sexual activity.  Eating habits.  History of falls.  Memory and ability to understand (cognition).  Work and work Astronomerenvironment.  Reproductive health. Screening  You may have the following tests or measurements:  Height, weight, and BMI.  Blood pressure.  Lipid and cholesterol levels. These may be checked every 5 years, or more frequently if you are over 81 years old.  Skin check.  Lung cancer screening. You may have this screening every year starting at age 81 if you have a 30-pack-year history of smoking and currently smoke or have quit within the past 15 years.  Fecal occult blood test (FOBT) of the stool. You may have this test every year starting at age 81.  Flexible sigmoidoscopy or colonoscopy. You may have a sigmoidoscopy every 5 years or a colonoscopy every 10 years starting at age 81.  Hepatitis C blood test.  Hepatitis B blood test.  Sexually transmitted disease (STD) testing.  Diabetes screening. This is done by checking your blood sugar (glucose) after you have not eaten for a while (fasting). You may have this done every 1-3 years.  Bone density scan. This is done to screen for osteoporosis. You may have this done starting at age 81.  Mammogram. This may be done every 1-2 years. Talk to your health care provider about how often you should have regular mammograms. Talk with your health care provider about your test results, treatment options, and if necessary, the need for more tests. Vaccines  Your health care provider may recommend certain vaccines, such as:  Influenza vaccine. This is recommended every year.  Tetanus, diphtheria, and acellular  pertussis (Tdap, Td) vaccine. You may need a Td booster every 10 years.  Zoster vaccine. You may need this after age 18.  Pneumococcal 13-valent conjugate (PCV13) vaccine. One dose is recommended after age 31.  Pneumococcal polysaccharide (PPSV23) vaccine. One  dose is recommended after age 58. Talk to your health care provider about which screenings and vaccines you need and how often you need them. This information is not intended to replace advice given to you by your health care provider. Make sure you discuss any questions you have with your health care provider. Document Released: 03/18/2015 Document Revised: 11/09/2015 Document Reviewed: 12/21/2014 Elsevier Interactive Patient Education  2017 [place] Prevention in the Home Falls can cause injuries. They can happen to people of all ages. There are many things you can do to make your home safe and to help prevent falls. What can I do on the outside of my home?  Regularly fix the edges of walkways and driveways and fix any cracks.  Remove anything that might make you trip as you walk through a door, such as a raised step or threshold.  Trim any bushes or trees on the path to your home.  Use bright outdoor lighting.  Clear any walking paths of anything that might make someone trip, such as rocks or tools.  Regularly check to see if handrails are loose or broken. Make sure that both sides of any steps have handrails.  Any raised decks and porches should have guardrails on the edges.  Have any leaves, snow, or ice cleared regularly.  Use sand or salt on walking paths during winter.  Clean up any spills in your garage right away. This includes oil or grease spills. What can I do in the bathroom?  Use night lights.  Install grab bars by the toilet and in the tub and shower. Do not use towel bars as grab bars.  Use non-skid mats or decals in the tub or shower.  If you need to sit down in the shower, use a plastic, non-slip stool.  Keep the floor dry. Clean up any water that spills on the floor as soon as it happens.  Remove soap buildup in the tub or shower regularly.  Attach bath mats securely with double-sided non-slip rug tape.  Do not have throw rugs and other  things on the floor that can make you trip. What can I do in the bedroom?  Use night lights.  Make sure that you have a light by your bed that is easy to reach.  Do not use any sheets or blankets that are too big for your bed. They should not hang down onto the floor.  Have a firm chair that has side arms. You can use this for support while you get dressed.  Do not have throw rugs and other things on the floor that can make you trip. What can I do in the kitchen?  Clean up any spills right away.  Avoid walking on wet floors.  Keep items that you use a lot in easy-to-reach places.  If you need to reach something above you, use a strong step stool that has a grab bar.  Keep electrical cords out of the way.  Do not use floor polish or wax that makes floors slippery. If you must use wax, use non-skid floor wax.  Do not have throw rugs and other things on the floor that can make you trip. What can I do with my stairs?  Do not leave any items on the stairs.  Make sure that there are handrails on both sides of the stairs and use them. Fix handrails that are broken or loose. Make sure that handrails are as Zinni as the stairways.  Check any carpeting to make sure that it is firmly attached to the stairs. Fix any carpet that is loose or worn.  Avoid having throw rugs at the top or bottom of the stairs. If you do have throw rugs, attach them to the floor with carpet tape.  Make sure that you have a light switch at the top of the stairs and the bottom of the stairs. If you do not have them, ask someone to add them for you. What else can I do to help prevent falls?  Wear shoes that:  Do not have high heels.  Have rubber bottoms.  Are comfortable and fit you well.  Are closed at the toe. Do not wear sandals.  If you use a stepladder:  Make sure that it is fully opened. Do not climb a closed stepladder.  Make sure that both sides of the stepladder are locked into place.  Ask  someone to hold it for you, if possible.  Clearly mark and make sure that you can see:  Any grab bars or handrails.  First and last steps.  Where the edge of each step is.  Use tools that help you move around (mobility aids) if they are needed. These include:  Canes.  Walkers.  Scooters.  Crutches.  Turn on the lights when you go into a dark area. Replace any light bulbs as soon as they burn out.  Set up your furniture so you have a clear path. Avoid moving your furniture around.  If any of your floors are uneven, fix them.  If there are any pets around you, be aware of where they are.  Review your medicines with your doctor. Some medicines can make you feel dizzy. This can increase your chance of falling. Ask your doctor what other things that you can do to help prevent falls. This information is not intended to replace advice given to you by your health care provider. Make sure you discuss any questions you have with your health care provider. Document Released: 12/16/2008 Document Revised: 07/28/2015 Document Reviewed: 03/26/2014 Elsevier Interactive Patient Education  2017 [employer].

## 2017-09-03 NOTE — Progress Notes (Signed)
Subjective:   Adriana [name] is a 83 y.o. female who presents for Medicare Annual (Subsequent) preventive examination at [facility]  Last AWV-05/23/2016    Objective:     Vitals: BP 126/78 (BP Location: Left Arm, Patient Position: Sitting)   Pulse (!) 49   Temp 97.9 F (36.6 C) (Oral)   Ht 5\' 6"  (1.676 m)   Wt 147 lb (66.7 kg)   SpO2 98%   BMI 23.73 kg/m   Body mass index is 23.73 kg/m.  Advanced Directives 09/03/2017 08/27/2017 10/23/2012 10/21/2012 08/01/2012 08/01/2012  Does Patient Have a Medical Advance Directive? Yes Yes - Patient has advance directive, copy not in chart Patient has advance directive, copy not in chart Patient has advance directive, copy not in chart  Type of Advance Directive Healthcare Power of [place];Living will Living will - Healthcare Power of [place];Living will Healthcare Power of [place];Living will Healthcare Power of [place];Living will  Does patient want to make changes to medical advance directive? No - Patient declined No - Patient declined - - - -  Copy of Healthcare Power of Attorney in Chart? Yes - - Copy requested from family Copy requested from family Copy requested from family  Pre-existing out of facility DNR order (yellow form or pink MOST form) - - No - No -    Tobacco Social History   Tobacco Use  Smoking Status Never Smoker  Smokeless Tobacco Never Used     Counseling given: Not Answered   Clinical Intake:  Pre-visit preparation completed: No  Pain : 0-10 Pain Score: 2  Pain Type: Chronic pain Pain Location: Back Pain Orientation: Lower Pain Descriptors / Indicators: Aching Pain Onset: More than a month ago Pain Frequency: Intermittent     Nutritional Risks: None Diabetes: No  How often do you need to have someone help you when you read instructions, pamphlets, or other written materials from your doctor or pharmacy?: 1 - Never What is the last grade level you completed in  school?: Bachelors  Interpreter Needed?: No  Information entered by :: Tyron Russell, RN  Past Medical History:  Diagnosis Date  . Anginal pain (HCC) 08/24/2010   Echo-EF =>55%,LV normal  . Arthritis   . Cataract 2008   Both eyes  Epes MD  . Chest pain, unspecified 09/04/2007   Lexiscan-- EF 72%; LV normal  . Constipation    Takes flax seed and honey .  Smooth Move tea.  . Depression   . Diverticulitis   . Head injury, closed    with fall  . Hypertension   . Mitral valve prolapse   . Osteopenia    Past Surgical History:  Procedure Laterality Date  . ANTERIOR CERVICAL DECOMP/DISCECTOMY FUSION N/A 10/23/2012   Procedure: Cervical Five to Cervical Six, Cervical Six to Cervical Seven anterior cervical decompression with fusion plating and bonegraft;  Surgeon: Hewitt Shorts, MD;  Location: [facility];  Service: Neurosurgery;  Laterality: N/A;  ANTERIOR CERVICAL DECOMPRESSION/DISCECTOMY FUSION 2 LEVELS  . BREAST CYST ASPIRATION  1990   benign  . CARDIAC CATHETERIZATION  08/01/2010   normal coronaries  . COLONOSCOPY  approx 2011  . EYE SURGERY Bilateral 2011  . intestinal blockage surgery  Feb. 2012   "kink in small intestine" per pt   Family History  Problem Relation Age of Onset  . Hypertension Mother   . Transient ischemic attack Mother   . Parkinson's disease Mother   . Dementia Mother   .  Diabetes Father   . Coronary artery disease Father   . Heart failure Father   . Prostate cancer Brother   . Diabetes Brother   . Prostate cancer Paternal Grandmother    Social History   Socioeconomic History  . Marital status: Married    Spouse name: Adriana [name]  . Number of children: 2  . Years of education: college  . Highest education level: Bachelor's degree (e.g., BA, AB, BS)  Occupational History  . Not on file  Social Needs  . Financial resource strain: Not hard at all  . Food insecurity:    Worry: Never true    Inability: Never true  . Transportation needs:     Medical: No    Non-medical: No  Tobacco Use  . Smoking status: Never Smoker  . Smokeless tobacco: Never Used  Substance and Sexual Activity  . Alcohol use: Yes    Alcohol/week: 2.4 oz    Types: 2 Glasses of wine, 2 Cans of beer per week    Comment: occasionally  . Drug use: No  . Sexual activity: Yes  Lifestyle  . Physical activity:    Days per week: 7 days    Minutes per session: 40 min  . Stress: To some extent  Relationships  . Social connections:    Talks on phone: More than three times a week    Gets together: More than three times a week    Attends religious service: Never    Active member of club or organization: No    Attends meetings of clubs or organizations: Never    Relationship status: Married  Other Topics Concern  . Not on file  Social History Narrative   Diet:  Normal   Do you drink/eat things with caffeine?   Yes   Marital status:  Widow                            What year were you married? 1986 and 1960   Do you live in a house, apartment, assisted living, condo, trailer, etc)?  Apartment   Is it one or more stories? 1+   How many persons live in your home?  1   Do you have any pets in your home?  No   Current or past profession:  Runner, broadcasting/film/video, Environmental health practitioner at [employer] you exercise?   Yes                                                 Type & how often: Stretching,  Strengthening   Do you have a living will?  Yes   Do you have a DNR Form?   Do you have a POA/HPOA forms?  HPOA    Outpatient Encounter Medications as of 09/03/2017  Medication Sig  . acetaminophen (TYLENOL) 325 MG tablet Take 650 mg by mouth as needed (back pain).  Marland Kitchen alendronate (FOSAMAX) 70 MG tablet Take 1 tablet (70 mg total) by mouth every 7 (seven) days. Take with a full glass of water on an empty stomach.  Marland Kitchen amoxicillin (AMOXIL) 500 MG capsule Take 1 tablet before dental appointment.  Marland Kitchen buPROPion (WELLBUTRIN XL) 150 MG 24 hr tablet Take 150 mg by mouth daily with breakfast.     . calcium carbonate (OS-CAL) 600 MG TABS tablet Take  1 tablet (600 mg total) by mouth 2 (two) times daily with a meal.  . Cholecalciferol (VITAMIN D-3) 5000 UNITS TABS Take 5,000 Units by mouth 3 (three) times a week. Takes on Monday, Adriana, and Friday  . linaclotide (LINZESS) 145 MCG CAPS capsule Take 145 mcg by mouth daily before breakfast.  . metoprolol tartrate (LOPRESSOR) 25 MG tablet Take 1 tablet (25 mg total) by mouth 2 (two) times daily.  . Multiple Vitamin (MULTIVITAMIN WITH MINERALS) TABS Take 1 tablet by mouth daily.  . traMADol (ULTRAM) 50 MG tablet Take 1 tablet (50 mg total) by mouth every 6 (six) hours as needed for severe pain.   No facility-administered encounter medications on file as of 09/03/2017.     Activities of Daily Living In your present state of health, do you have any difficulty performing the following activities: 09/03/2017  Hearing? N  Vision? N  Difficulty concentrating or making decisions? Y  Walking or climbing stairs? Y  Dressing or bathing? N  Doing errands, shopping? N  Preparing Food and eating ? N  Using the Toilet? N  In the past six months, have you accidently leaked urine? Y  Comment sometimes during the night  Do you have problems with loss of bowel control? N  Managing your Medications? N  Managing your Finances? N  Housekeeping or managing your Housekeeping? N  Some recent data might be hidden    Patient Care Team: Oneal GroutPandey, Mahima, MD as PCP - General (Internal Medicine) [name], [name], NP as Nurse Practitioner (Internal Medicine)    Assessment:   This is a routine wellness examination for Adriana SquibbJane.  Exercise Activities and Dietary recommendations Current Exercise Habits: Home exercise routine, Type of exercise: strength training/weights, Time (Minutes): 40, Frequency (Times/Week): 7, Weekly Exercise (Minutes/Week): 280, Intensity: Mild, Exercise limited by: None identified  Goals    None      Fall Risk Fall Risk  09/03/2017  Falls  in the past year? No   Is the patient's home free of loose throw rugs in walkways, pet beds, electrical cords, etc?   yes      Grab bars in the bathroom? yes      Handrails on the stairs?   yes      Adequate lighting?   yes  Depression Screen PHQ 2/9 Scores 09/03/2017  PHQ - 2 Score 4  PHQ- 9 Score 9     Cognitive Function MMSE - Mini Mental State Exam 09/03/2017  Orientation to time 5  Orientation to Place 5  Registration 3  Attention/ Calculation 5  Recall 2  Language- name 2 objects 2  Language- repeat 1  Language- follow 3 step command 3  Language- read & follow direction 1  Write a sentence 1  Copy design 1  Total score 29        Immunization History  Administered Date(s) Administered  . Influenza, High Dose Seasonal PF 12/08/2016  . Pneumococcal Conjugate-13 06/12/2013  . Pneumococcal Polysaccharide-23 07/28/2002  . Tdap 07/27/2009  . Zoster Recombinat (Shingrix) 01/08/2017, 06/03/2017    Qualifies for Shingles Vaccine? No, up to date completed  Screening Tests Health Maintenance  Topic Date Due  . INFLUENZA VACCINE  10/03/2017  . TETANUS/TDAP  07/28/2019  . DEXA SCAN  Completed  . PNA vac Low Risk Adult  Completed    Cancer Screenings: Lung: Low Dose CT Chest recommended if Age 63-80 years, 30 pack-year currently smoking OR have quit w/in 15years. Patient does not qualify. Breast:  Up  to date on Mammogram? Yes   Up to date of Bone Density/Dexa? Yes Colorectal: up to date  Additional Screenings:  Hepatitis C Screening: declined     Plan:    I have personally reviewed and addressed the Medicare Annual Wellness questionnaire and have noted the following in the patient's chart:  A. Medical and social history B. Use of alcohol, tobacco or illicit drugs  C. Current medications and supplements D. Functional ability and status E.  Nutritional status F.  Physical activity G. Advance directives H. List of other physicians I.  Hospitalizations,  surgeries, and ER visits in previous 12 months J.  Vitals K. Screenings to include hearing, vision, cognitive, depression L. Referrals and appointments - none  In addition, I have reviewed and discussed with patient certain preventive protocols, quality metrics, and best practice recommendations. A written personalized care plan for preventive services as well as general preventive health recommendations were provided to patient.  See attached scanned questionnaire for additional information.   Signed,   Tyron Russell, RN Nurse Health Advisor  Patient concerns: None

## 2017-09-17 ENCOUNTER — Telehealth: Payer: Self-pay

## 2017-09-17 NOTE — Telephone Encounter (Signed)
Received a call from Lillia Abedinika Allen, RN stating that Mrs. Holzheimer was having some abdominal pain( no distention at present) with some vomiting and diarrhea.   Per Dr. Glade LloydPandey I advised Tinika to tell the patient to push fluids, try eating some soup and a banana. Patient scheduled to be seen in the clinic 09/19/17 at 9:30 am to evaluate and discuss treatment further. But if symptoms persist or get worse patient needs to go to urgent care.

## 2017-09-18 NOTE — Telephone Encounter (Signed)
Spoke with the patient and she mentioned that she feels better. Patient wanted to cancel her appointment for tomorrow. Patients appointment has been cancelled.

## 2017-09-19 ENCOUNTER — Encounter: Payer: Self-pay | Admitting: Internal Medicine

## 2017-10-10 ENCOUNTER — Other Ambulatory Visit: Payer: Self-pay

## 2017-10-15 ENCOUNTER — Other Ambulatory Visit: Payer: Self-pay

## 2017-10-15 ENCOUNTER — Encounter: Payer: Self-pay | Admitting: Internal Medicine

## 2017-10-15 ENCOUNTER — Non-Acute Institutional Stay: Payer: Medicare Other | Admitting: Internal Medicine

## 2017-10-15 VITALS — BP 124/70 | HR 60 | Temp 98.1°F | Resp 18 | Ht 66.0 in | Wt 143.8 lb

## 2017-10-15 DIAGNOSIS — M810 Age-related osteoporosis without current pathological fracture: Secondary | ICD-10-CM

## 2017-10-15 DIAGNOSIS — I341 Nonrheumatic mitral (valve) prolapse: Secondary | ICD-10-CM | POA: Insufficient documentation

## 2017-10-15 DIAGNOSIS — F320 Major depressive disorder, single episode, mild: Secondary | ICD-10-CM

## 2017-10-15 DIAGNOSIS — K5900 Constipation, unspecified: Secondary | ICD-10-CM

## 2017-10-15 DIAGNOSIS — I10 Essential (primary) hypertension: Secondary | ICD-10-CM | POA: Diagnosis not present

## 2017-10-15 MED ORDER — ALENDRONATE SODIUM 70 MG PO TABS: 70.0000 mg | ORAL_TABLET | ORAL | 11 refills | Status: DC

## 2017-10-15 NOTE — Progress Notes (Signed)
[facility]  Provider: Blanchie Serve MD   Location:  [place] of Service:  Clinic (12)  PCP: Blanchie Serve, MD Patient Care Team: Blanchie Serve, MD as PCP - General (Internal Medicine) [name], [name], NP as Nurse Practitioner (Internal Medicine)  Extended Emergency Contact Information Primary Emergency Contact: Bary Leriche Address: [address]          [place], [place] [address] Johnnette Litter of [place] Phone: [phone] Mobile Phone: [phone] Relation: Spouse Secondary Emergency Contact: Gaetana Michaelis [place] of [place] Mobile Phone: [phone] Relation: Daughter   Goals of Care: Advanced Directive information Advanced Directives 10/15/2017  Does Patient Have a Medical Advance Directive? Yes  Type of Paramedic of [place];Living will  Does patient want to make changes to medical advance directive? No - Patient declined  Copy of [place] in Chart? Yes  Pre-existing out of facility DNR order (yellow form or pink MOST form) -      Chief Complaint  Patient presents with  . Medical Management of Chronic Issues    3 month follow up. Patient wants to discuss discontinuing some of her medications.   . Medication Refill    No refills needed at this time    HPI: Patient is a 83 y.o. female seen today for routine visit. She has some concerns/ question about her medicine. In July she had 2-3 days episode of vomiting and diarrhea that has now resolved. She has been thinking about coming off bupropion and linzess. Since she felt she had no side effect from being off bupropion for 3 days, she decided to stay off it and has not been taking it since July. She has also not been taking linzess and her bowel movement is regular. She has question about calcium making her constipated. Denies any acute concern. Does not want to take her multivitamin. Taking her anti-hypertensive, tolerating  it well. Has not taken her fosamax for 4 weeks. Known history of osteoporosis since atleast 2014, most recent dexa scan with T score -2.2 with osteopenia.   Past Medical History:  Diagnosis Date  . Anginal pain ([place]) 08/24/2010   Echo-EF =>55%,LV normal  . Arthritis   . Cataract 2008   Both eyes  Epes MD  . Chest pain, unspecified 09/04/2007   Lexiscan-- EF 72%; LV normal  . Constipation    Takes flax seed and honey .  Smooth Move tea.  . Depression   . Diverticulitis   . Head injury, closed    with fall  . Hypertension   . Mitral valve prolapse   . Osteopenia    Past Surgical History:  Procedure Laterality Date  . ANTERIOR CERVICAL DECOMP/DISCECTOMY FUSION N/A 10/23/2012   Procedure: Cervical Five to Cervical Six, Cervical Six to Cervical Seven anterior cervical decompression with fusion plating and bonegraft;  Surgeon: Hosie Spangle, MD;  Location: [facility];  Service: Neurosurgery;  Laterality: N/A;  ANTERIOR CERVICAL DECOMPRESSION/DISCECTOMY FUSION 2 LEVELS  . BREAST CYST ASPIRATION  1990   benign  . CARDIAC CATHETERIZATION  08/01/2010   normal coronaries  . COLONOSCOPY  approx 2011  . EYE SURGERY Bilateral 2011  . intestinal blockage surgery  Feb. 2012   "kink in small intestine" per pt    reports that she has never smoked. She has never used smokeless tobacco. She reports that she drinks about 4.0 standard drinks of alcohol per week. She reports that she does  not use drugs. Social History   Socioeconomic History  . Marital status: Married    Spouse name: Joe  . Number of children: 2  . Years of education: college  . Highest education level: Bachelor's degree (e.g., BA, AB, BS)  Occupational History  . Not on file  Social Needs  . Financial resource strain: Not hard at all  . Food insecurity:    Worry: Never true    Inability: Never true  . Transportation needs:    Medical: No    Non-medical: No  Tobacco Use  . Smoking status: Never Smoker  .  Smokeless tobacco: Never Used  Substance and Sexual Activity  . Alcohol use: Yes    Alcohol/week: 4.0 standard drinks    Types: 2 Glasses of wine, 2 Cans of beer per week    Comment: occasionally  . Drug use: No  . Sexual activity: Yes  Lifestyle  . Physical activity:    Days per week: 7 days    Minutes per session: 40 min  . Stress: To some extent  Relationships  . Social connections:    Talks on phone: More than three times a week    Gets together: More than three times a week    Attends religious service: Never    Active member of club or organization: No    Attends meetings of clubs or organizations: Never    Relationship status: Married  . Intimate partner violence:    Fear of current or ex partner: No    Emotionally abused: No    Physically abused: No    Forced sexual activity: No  Other Topics Concern  . Not on file  Social History Narrative   Diet:  Normal   Do you drink/eat things with caffeine?   Yes   Marital status:  Widow                            What year were you married? 1986 and 1960   Do you live in a house, apartment, assisted living, condo, trailer, etc)?  Apartment   Is it one or more stories? 1+   How many persons live in your home?  1   Do you have any pets in your home?  No   Current or past profession:  Pharmacist, hospital, Web designer at [employer] you exercise?   Yes                                                 Type & how often: Stretching,  Strengthening   Do you have a living will?  Yes   Do you have a DNR Form?   Do you have a POA/HPOA forms?  HPOA    Functional Status Survey:    Family History  Problem Relation Age of Onset  . Hypertension Mother   . Transient ischemic attack Mother   . Parkinson's disease Mother   . Dementia Mother   . Diabetes Father   . Coronary artery disease Father   . Heart failure Father   . Prostate cancer Brother   . Diabetes Brother   . Prostate cancer Paternal Grandmother     Health Maintenance   Topic Date Due  . INFLUENZA VACCINE  10/03/2017  . TETANUS/TDAP  07/28/2019  . DEXA  SCAN  Completed  . PNA vac Low Risk Adult  Completed    Allergies  Allergen Reactions  . Contrast Media [Iodinated Diagnostic Agents] Hives    Hives during IVP at urology office '02, requires 13 hr prep///a.[email or web address]  Patient came thru ER and had a 1 hour pre-medication and did fine  . Oxycodone Other (See Comments)    Decreased appetite.  Jaclyn Prime [Ibandronic Acid]     Nausea, vomiting, weakness  . Codeine Diarrhea and Nausea And Vomiting  . Darvocet [Propoxyphene N-Acetaminophen] Diarrhea and Nausea And Vomiting  . Erythromycin Nausea And Vomiting    Outpatient Encounter Medications as of 10/15/2017  Medication Sig  . acetaminophen (TYLENOL) 325 MG tablet Take 650 mg by mouth as needed (back pain).  Marland Kitchen amoxicillin (AMOXIL) 500 MG capsule Take 1 tablet before dental appointment.  . calcium carbonate (OS-CAL) 600 MG TABS tablet Take 1 tablet (600 mg total) by mouth 2 (two) times daily with a meal.  . metoprolol tartrate (LOPRESSOR) 25 MG tablet Take 1 tablet (25 mg total) by mouth 2 (two) times daily.  Marland Kitchen alendronate (FOSAMAX) 70 MG tablet Take 1 tablet (70 mg total) by mouth every 7 (seven) days. Take with a full glass of water on an empty stomach. (Patient not taking: Reported on 10/15/2017)  . buPROPion (WELLBUTRIN XL) 150 MG 24 hr tablet Take 150 mg by mouth daily with breakfast.   . Cholecalciferol (VITAMIN D-3) 5000 UNITS TABS Take 5,000 Units by mouth 3 (three) times a week. Takes on Monday, Wednesday, and Friday  . linaclotide (LINZESS) 145 MCG CAPS capsule Take 145 mcg by mouth daily before breakfast.  . Multiple Vitamin (MULTIVITAMIN WITH MINERALS) TABS Take 1 tablet by mouth daily.  . traMADol (ULTRAM) 50 MG tablet Take 1 tablet (50 mg total) by mouth every 6 (six) hours as needed for severe pain. (Patient not taking: Reported on 10/15/2017)   No facility-administered encounter medications on  file as of 10/15/2017.     Review of Systems  Constitutional: Negative for appetite change, chills, fatigue and fever.  HENT: Negative for congestion, ear discharge, ear pain, mouth sores, sore throat and trouble swallowing.   Eyes: Positive for visual disturbance.       Has corrective glasses  Respiratory: Negative for cough, shortness of breath and wheezing.   Cardiovascular: Negative for chest pain and palpitations.  Gastrointestinal: Negative for abdominal pain, constipation, diarrhea, nausea and vomiting.       Has self stopped linzess, bowel movement is regular  Genitourinary: Negative for dysuria, frequency and hematuria.       Wakes up twice at night to urinate  Musculoskeletal: Positive for arthralgias and back pain. Negative for gait problem.       No fall reported  Skin: Negative for rash and wound.  Neurological: Negative for dizziness, tremors, numbness and headaches.  Psychiatric/Behavioral: Negative for behavioral problems, confusion and dysphoric mood. The patient is not nervous/anxious.     Vitals:   10/15/17 1104  BP: 124/70  Pulse: 60  Resp: 18  Temp: 98.1 F (36.7 C)  TempSrc: Oral  SpO2: 98%  Weight: 143 lb 12.8 oz (65.2 kg)  Height: 5' 6" (1.676 m)   Body mass index is 23.21 kg/m.   Wt Readings from Last 3 Encounters:  10/15/17 143 lb 12.8 oz (65.2 kg)  09/03/17 147 lb (66.7 kg)  08/27/17 145 lb 12.8 oz (66.1 kg)   Physical Exam  Constitutional: She is oriented to person, place, and time.  She appears well-developed and well-nourished. No distress.  HENT:  Head: Normocephalic and atraumatic.  Right Ear: External ear normal.  Left Ear: External ear normal.  Nose: Nose normal.  Mouth/Throat: Oropharynx is clear and moist. No oropharyngeal exudate.  Eyes: Pupils are equal, round, and reactive to light. Conjunctivae and EOM are normal. Right eye exhibits no discharge. Left eye exhibits no discharge.  Neck: Normal range of motion. Neck supple.    Cardiovascular: Normal rate and regular rhythm.  Pulmonary/Chest: Effort normal and breath sounds normal. She has no wheezes. She has no rales.  Abdominal: Soft. Bowel sounds are normal. There is no tenderness. There is no guarding.  Musculoskeletal: Normal range of motion. She exhibits no edema.  Lymphadenopathy:    She has no cervical adenopathy.  Neurological: She is alert and oriented to person, place, and time.  Skin: Skin is warm and dry. She is not diaphoretic.    Labs reviewed: Basic Metabolic Panel: Recent Labs    07/16/17 0000  NA 141  K 3.8  CL 106  CO2 31  GLUCOSE 93  BUN 14  CREATININE 0.88  CALCIUM 9.7   Liver Function Tests: Recent Labs    07/16/17 0000  AST 24  ALT 15  BILITOT 0.5  PROT 7.0   No results for input(s): LIPASE, AMYLASE in the last 8760 hours. No results for input(s): AMMONIA in the last 8760 hours. CBC: Recent Labs    07/16/17 0000  WBC 5.4  HGB 14.0  HCT 40.5  MCV 91.2  PLT 277   Cardiac Enzymes: No results for input(s): CKTOTAL, CKMB, CKMBINDEX, TROPONINI in the last 8760 hours. BNP: Invalid input(s): POCBNP Lab Results  Component Value Date   HGBA1C  08/01/2010    5.4 (NOTE)                                                                       According to the ADA Clinical Practice Recommendations for 2011, when HbA1c is used as a screening test:   >=6.5%   Diagnostic of Diabetes Mellitus           (if abnormal result  is confirmed)  5.7-6.4%   Increased risk of developing Diabetes Mellitus  References:Diagnosis and Classification of Diabetes Mellitus,Diabetes TMYT,1173,56(POLID 1):S62-S69 and Standards of Medical Care in         Diabetes - 2011,Diabetes CVUD,3143,88  (Suppl 1):S11-S61.   Lab Results  Component Value Date   TSH 3.12 07/16/2017   No results found for: VITAMINB12 No results found for: FOLATE No results found for: IRON, TIBC, FERRITIN  Lipid Panel: Recent Labs    07/16/17 0000  CHOL 169  HDL 53   LDLCALC 98  TRIG 89  CHOLHDL 3.2   Lab Results  Component Value Date   HGBA1C  08/01/2010    5.4 (NOTE)  According to the ADA Clinical Practice Recommendations for 2011, when HbA1c is used as a screening test:   >=6.5%   Diagnostic of Diabetes Mellitus           (if abnormal result  is confirmed)  5.7-6.4%   Increased risk of developing Diabetes Mellitus  References:Diagnosis and Classification of Diabetes Mellitus,Diabetes EYCX,4481,85(UDJSH 1):S62-S69 and Standards of Medical Care in         Diabetes - 2011,Diabetes FWYO,3785,88  (Suppl 1):S11-S61.    Procedures since last visit: No results found.  Assessment/Plan  1. Osteoporosis without current pathological fracture, unspecified osteoporosis type Continue weekly fosamax and daily calcium and vitamin d supplement - alendronate (FOSAMAX) 70 MG tablet; Take 1 tablet (70 mg total) by mouth every 7 (seven) days. Take with a full glass of water on an empty stomach.  Dispense: 4 tablet; Refill: 11 - CMP with eGFR([facility]); Future  2. MVP (mitral valve prolapse) Asymptomatic, monitor  3. Essential hypertension, benign Continue metoprolol tartrate 25 mg bid and monitor - CMP with eGFR([facility]); Future - Lipid Panel; Future - CBC (no diff); Future  4. Constipation, unspecified constipation type Self stopped linzess, encouraged fiber intake and hydration, monitor - CMP with eGFR([facility]); Future  5. Current mild episode of major depressive disorder, unspecified whether recurrent ([place]) Denies any symptom of depression, has self discontinued wellbutrin. Monitor clinically.    Labs/tests ordered:   Lab Orders     CMP with eGFR([facility])     Lipid Panel     CBC (no diff)   Next appointment: 4 months  Communication: reviewed care plan with patient    Blanchie Serve, MD Internal Medicine [place], [place] [address] Cell Phone (Monday-Friday 8 am - 5 pm): [phone] On Call: [phone] and follow prompts after 5 pm and on weekends Office Phone: [phone] Office Fax: [phone]

## 2017-10-16 ENCOUNTER — Encounter: Payer: Self-pay | Admitting: Internal Medicine

## 2017-10-30 ENCOUNTER — Other Ambulatory Visit: Payer: Self-pay

## 2017-10-30 DIAGNOSIS — I10 Essential (primary) hypertension: Secondary | ICD-10-CM

## 2017-10-30 DIAGNOSIS — M810 Age-related osteoporosis without current pathological fracture: Secondary | ICD-10-CM

## 2017-10-30 DIAGNOSIS — K5900 Constipation, unspecified: Secondary | ICD-10-CM

## 2017-11-06 ENCOUNTER — Encounter: Payer: Self-pay | Admitting: *Deleted

## 2017-11-06 LAB — BASIC METABOLIC PANEL
Calcium: 10.6
Carbon Dioxide, Total: 31
Chloride: 101
EGFR (Non-African Amer.): 61

## 2018-01-28 DIAGNOSIS — K5900 Constipation, unspecified: Secondary | ICD-10-CM

## 2018-01-28 DIAGNOSIS — M810 Age-related osteoporosis without current pathological fracture: Secondary | ICD-10-CM

## 2018-01-28 DIAGNOSIS — I10 Essential (primary) hypertension: Secondary | ICD-10-CM

## 2018-01-28 LAB — COMPLETE METABOLIC PANEL WITH GFR
AG Ratio: 1.9 (calc) (ref 1.0–2.5)
ALT: 12 U/L (ref 6–29)
AST: 18 U/L (ref 10–35)
Albumin: 4.3 g/dL (ref 3.6–5.1)
Alkaline phosphatase (APISO): 57 U/L (ref 33–130)
BUN: 20 mg/dL (ref 7–25)
CO2: 29 mmol/L (ref 20–32)
Calcium: 10.1 mg/dL (ref 8.6–10.4)
Chloride: 106 mmol/L (ref 98–110)
Creat: 0.75 mg/dL (ref 0.60–0.88)
GFR, Est African American: 87 mL/min/{1.73_m2} (ref >=60)
GFR, Est Non African American: 75 mL/min/{1.73_m2} (ref >=60)
Globulin: 2.3 g/dL (calc) (ref 1.9–3.7)
Glucose, Bld: 90 mg/dL (ref 65–99)
Potassium: 4.1 mmol/L (ref 3.5–5.3)
Sodium: 142 mmol/L (ref 135–146)
Total Bilirubin: 0.4 mg/dL (ref 0.2–1.2)
Total Protein: 6.6 g/dL (ref 6.1–8.1)

## 2018-01-28 LAB — CBC
HCT: 40 % (ref 35.0–45.0)
Hemoglobin: 13.5 g/dL (ref 11.7–15.5)
MCH: 30.9 pg (ref 27.0–33.0)
MCHC: 33.8 g/dL (ref 32.0–36.0)
MCV: 91.5 fL (ref 80.0–100.0)
MPV: 10.9 fL (ref 7.5–12.5)
Platelets: 246 10*3/uL (ref 140–400)
RBC: 4.37 10*6/uL (ref 3.80–5.10)
RDW: 11.6 % (ref 11.0–15.0)
WBC: 5.8 10*3/uL (ref 3.8–10.8)

## 2018-01-28 LAB — LIPID PANEL
Cholesterol: 160 mg/dL (ref <200)
HDL: 51 mg/dL (ref >50)
LDL Cholesterol (Calc): 93 mg/dL (calc)
Non-HDL Cholesterol (Calc): 109 mg/dL (calc) (ref <130)
Total CHOL/HDL Ratio: 3.1 (calc) (ref <5.0)
Triglycerides: 72 mg/dL (ref <150)

## 2018-02-06 ENCOUNTER — Non-Acute Institutional Stay: Payer: Medicare Other | Admitting: Nurse Practitioner

## 2018-02-06 ENCOUNTER — Encounter: Payer: Self-pay | Admitting: Nurse Practitioner

## 2018-02-06 DIAGNOSIS — M810 Age-related osteoporosis without current pathological fracture: Secondary | ICD-10-CM

## 2018-02-06 DIAGNOSIS — I10 Essential (primary) hypertension: Secondary | ICD-10-CM

## 2018-02-06 DIAGNOSIS — F320 Major depressive disorder, single episode, mild: Secondary | ICD-10-CM | POA: Diagnosis not present

## 2018-02-06 NOTE — Patient Instructions (Signed)
F/u in clinic 6 months.  

## 2018-02-06 NOTE — Assessment & Plan Note (Signed)
Improving since her husband passed away about a year ago, less crying, sleeps better at night, off Wellbutrin.

## 2018-02-06 NOTE — Assessment & Plan Note (Signed)
No recent fractures, continue Alendronate 70mg  weekly, Vit 5,000 u 3x/week, Ca 600mg  bid.

## 2018-02-06 NOTE — Progress Notes (Signed)
Location:   [facility]   Place of Service:   clinic [facility] Provider: Chipper Oman NP  Code Status: DNR Goals of Care: [place] Advanced Directives 10/15/2017  Does Patient Have a Medical Advance Directive? Yes  Type of Estate agent of [place];Living will  Does patient want to make changes to medical advance directive? No - Patient declined  Copy of Healthcare Power of Attorney in Chart? Yes  Pre-existing out of facility DNR order (yellow form or pink MOST form) -     Chief Complaint  Patient presents with  . Medical Management of Chronic Issues    3 mo f/u- HTN, constipation     HPI: Patient is a 83 y.o. female seen for managing her chronic medical conditions. Hx of HTN, blood pressure is controlled on Metoprolol 25mg  bid. Osteoporosis is managed with Alendronate 70mg  weekly, Ca 600mg  bid, Vit D 5,000 u 3x/week. Last DEXA T score -2.2, osteopenia.   Past Medical History:  Diagnosis Date  . Anginal pain ([facility]) 08/24/2010   Echo-EF =>55%,LV normal  . Arthritis   . Cataract 12/2006   Both eyes  Epes MD  . Chest pain, unspecified 09/04/2007   Lexiscan-- EF 72%; LV normal  . Constipation    Takes flax seed and honey .  Smooth Move tea.  . Depression   . Diverticulitis   . Head injury, closed    with fall  . Hypertension   . Mitral valve prolapse   . Osteopenia     Past Surgical History:  Procedure Laterality Date  . ANTERIOR CERVICAL DECOMP/DISCECTOMY FUSION N/A 10/23/2012   Procedure: Cervical Five to Cervical Six, Cervical Six to Cervical Seven anterior cervical decompression with fusion plating and bonegraft;  Surgeon: Hewitt Shorts, MD;  Location: [facility];  Service: Neurosurgery;  Laterality: N/A;  ANTERIOR CERVICAL DECOMPRESSION/DISCECTOMY FUSION 2 LEVELS  . BREAST CYST ASPIRATION  1990   benign  . CARDIAC CATHETERIZATION  08/01/2010   normal coronaries  . COLONOSCOPY  approx 2011  . EYE SURGERY Bilateral 2011  . intestinal blockage surgery   Feb. 2012   "kink in small intestine" per pt    Allergies  Allergen Reactions  . Contrast Media [Iodinated Diagnostic Agents] Hives    Hives during IVP at urology office '02, requires 13 hr prep///a.calhoun  Patient came thru ER and had a 1 hour pre-medication and did fine  . Oxycodone Other (See Comments)    Decreased appetite.  Sandrea Hammond [Ibandronic Acid]     Nausea, vomiting, weakness  . Codeine Diarrhea and Nausea And Vomiting  . Darvocet [Propoxyphene N-Acetaminophen] Diarrhea and Nausea And Vomiting  . Erythromycin Nausea And Vomiting    Allergies as of 02/06/2018      Reactions   Contrast Media [iodinated Diagnostic Agents] Hives   Hives during IVP at urology office '02, requires 13 hr prep///a.[email or web address] Patient came thru ER and had a 1 hour pre-medication and did fine   Oxycodone Other (See Comments)   Decreased appetite.   Boniva [ibandronic Acid]    Nausea, vomiting, weakness   Codeine Diarrhea, Nausea And Vomiting   Darvocet [propoxyphene N-acetaminophen] Diarrhea, Nausea And Vomiting   Erythromycin Nausea And Vomiting      Medication List        Accurate as of 02/06/18 11:59 PM. Always use your most recent med list.          acetaminophen 325 MG tablet Commonly known as:  TYLENOL Take 650 mg by mouth  as needed (back pain).   acetaminophen 500 MG tablet Commonly known as:  TYLENOL Take 500 mg by mouth every morning.   alendronate 70 MG tablet Commonly known as:  FOSAMAX Take 1 tablet (70 mg total) by mouth every 7 (seven) days. Take with a full glass of water on an empty stomach.   amoxicillin 500 MG capsule Commonly known as:  AMOXIL Take 1 tablet before dental appointment.   calcium carbonate 600 MG Tabs tablet Commonly known as:  OS-CAL Take 1 tablet (600 mg total) by mouth 2 (two) times daily with a meal.   metoprolol tartrate 25 MG tablet Commonly known as:  LOPRESSOR Take 1 tablet (25 mg total) by mouth 2 (two) times daily.   STOOL  SOFTENER 100 MG capsule Generic drug:  docusate sodium Take 100 mg by mouth at bedtime.   Vitamin D-3 125 MCG (5000 UT) Tabs Take 5,000 Units by mouth 3 (three) times a week. Takes on Monday, Wednesday, and Friday       Review of Systems:  Review of Systems  Constitutional: Negative for activity change, appetite change, chills, diaphoresis, fatigue, fever and unexpected weight change.  HENT: Positive for hearing loss. Negative for congestion and voice change.   Respiratory: Negative for cough, shortness of breath and wheezing.   Gastrointestinal: Negative for abdominal distention, abdominal pain, constipation, diarrhea, nausea and vomiting.  Genitourinary: Negative for difficulty urinating, dysuria and urgency.  Musculoskeletal: Positive for arthralgias, back pain and gait problem.       Chronic lower back, left hip region pain with movement  Skin: Negative for color change and pallor.  Neurological: Negative for dizziness, speech difficulty, weakness and headaches.  Psychiatric/Behavioral: Positive for sleep disturbance. Negative for agitation, behavioral problems and hallucinations. The patient is not nervous/anxious.        Improving since her husband passed away about a year ago    Health Maintenance  Topic Date Due  . INFLUENZA VACCINE  10/03/2017  . TETANUS/TDAP  07/28/2019  . DEXA SCAN  Completed  . PNA vac Low Risk Adult  Completed    Physical Exam: Vitals:   02/06/18 1327  BP: 134/82  Pulse: 61  Resp: 20  Temp: 98 F (36.7 C)  SpO2: 97%  Weight: 146 lb 9.6 oz (66.5 kg)  Height: 5\' 6"  (1.676 m)   Body mass index is 23.66 kg/m. Physical Exam  Constitutional: She is oriented to person, place, and time. She appears well-developed and well-nourished.  HENT:  Head: Normocephalic and atraumatic.  Eyes: Pupils are equal, round, and reactive to light. EOM are normal.  Neck: Normal range of motion. Neck supple. No JVD present. No thyromegaly present.    Cardiovascular: Normal rate and regular rhythm.  No murmur heard. Pulmonary/Chest: Effort normal. She has no wheezes. She has no rales.  Abdominal: Soft. She exhibits no distension. There is no tenderness. There is no rebound and no guarding.  Musculoskeletal:  Ambulates with walker.   Neurological: She is alert and oriented to person, place, and time. No cranial nerve deficit. She exhibits normal muscle tone. Coordination normal.  Skin: Skin is warm and dry.  Psychiatric: She has a normal mood and affect. Her behavior is normal. Judgment and thought content normal.    Labs reviewed: Basic Metabolic Panel: Recent Labs    05/10/17 07/16/17 0000 01/28/18 0740  NA 137 141 142  K 4.3 3.8 4.1  CL 101 106 106  CO2 31 31 29   GLUCOSE  --  93  90  BUN 16 14 20   CREATININE 0.9 0.88 0.75  CALCIUM 10.6 9.7 10.1  TSH  --  3.12  --    Liver Function Tests: Recent Labs    07/16/17 0000 01/28/18 0740  AST 24 18  ALT 15 12  BILITOT 0.5 0.4  PROT 7.0 6.6   No results for input(s): LIPASE, AMYLASE in the last 8760 hours. No results for input(s): AMMONIA in the last 8760 hours. CBC: Recent Labs    07/16/17 0000 01/28/18 0740  WBC 5.4 5.8  HGB 14.0 13.5  HCT 40.5 40.0  MCV 91.2 91.5  PLT 277 246   Lipid Panel: Recent Labs    07/16/17 0000 01/28/18 0740  CHOL 169 160  HDL 53 51  LDLCALC 98 93  TRIG 89 72  CHOLHDL 3.2 3.1   Lab Results  Component Value Date   HGBA1C  08/01/2010    5.4 (NOTE)                                                                       According to the ADA Clinical Practice Recommendations for 2011, when HbA1c is used as a screening test:   >=6.5%   Diagnostic of Diabetes Mellitus           (if abnormal result  is confirmed)  5.7-6.4%   Increased risk of developing Diabetes Mellitus  References:Diagnosis and Classification of Diabetes Mellitus,Diabetes Care,2011,34(Suppl 1):S62-S69 and Standards of Medical Care in         Diabetes - 2011,Diabetes  Care,2011,34  (Suppl 1):S11-S61.    Procedures since last visit: No results found.  Assessment/Plan Essential hypertension, benign Blood pressure is controlled, continue Metoprolol 25mg  bid.   Osteoporosis No recent fractures, continue Alendronate 70mg  weekly, Vit 5,000 u 3x/week, Ca 600mg  bid.   Depression Improving since her husband passed away about a year ago, less crying, sleeps better at night, off Wellbutrin.     Labs/tests ordered: none  Next appt: 6 months

## 2018-02-06 NOTE — Assessment & Plan Note (Signed)
Blood pressure is controlled, continue Metoprolol 25mg bid.  

## 2018-02-07 ENCOUNTER — Encounter: Payer: Self-pay | Admitting: Nurse Practitioner

## 2018-04-03 ENCOUNTER — Non-Acute Institutional Stay: Payer: Medicare Other | Admitting: Nurse Practitioner

## 2018-04-03 ENCOUNTER — Encounter: Payer: Self-pay | Admitting: Nurse Practitioner

## 2018-04-03 DIAGNOSIS — K5900 Constipation, unspecified: Secondary | ICD-10-CM

## 2018-04-03 DIAGNOSIS — I10 Essential (primary) hypertension: Secondary | ICD-10-CM | POA: Diagnosis not present

## 2018-04-03 DIAGNOSIS — K5732 Diverticulitis of large intestine without perforation or abscess without bleeding: Secondary | ICD-10-CM | POA: Diagnosis not present

## 2018-04-03 NOTE — Assessment & Plan Note (Addendum)
Rest, a liquid, gradual to  low fiber diet, delay anbiotics: Cipro with Metronidazole vs Augmenti vs Avelox.

## 2018-04-03 NOTE — Assessment & Plan Note (Signed)
Blood pressure is in control, continue Metoprolol 25mg bid.  

## 2018-04-03 NOTE — Assessment & Plan Note (Signed)
Stable, continue Colace 100mg qd.  

## 2018-04-03 NOTE — Patient Instructions (Signed)
Rest, a liquid, gradual to  low fiber diet, delay anbiotics: Cipro with Metronidazole vs Augmenti vs Avelox. Avoid constipation. Call to clinic [facility] if no better.

## 2018-04-03 NOTE — Progress Notes (Signed)
Location:   Clinic [facility]   Place of Service:  Clinic (12) Provider: Chipper OmanManXie Alainna Stawicki NP  Code Status: DNR Goals of Care: [place] 10/15/2017  Does Patient Have a Medical Advance Directive? Yes  Type of Estate agentAdvance Directive Healthcare Power of [place];Living will  Does patient want to make changes to medical advance directive? No - Patient declined  Copy of Healthcare Power of Attorney in Chart? Yes  Pre-existing out of facility DNR order (yellow form or pink MOST form) -     Chief Complaint  Patient presents with  . Acute Visit    ? diverticulitis- crampy pain in lower abd    HPI: Patient is a 84 y.o. female seen today for an acute visit for ? Diverticulitis, abd pain x1 day and no disturbance of bowel function. on Rest, a liquid or low fiber diet, DELAY anbiotics: Cipro with Metronidazole VS Augmenti VS Avelox.Hx of HTN, blood pressure is in control, on Metoprolol 25mg  bid. Hx of constipation, stable on Colace qhs.   Past Medical History:  Diagnosis Date  . Anginal pain (HCC) 08/24/2010   Echo-EF =>55%,LV normal  . Arthritis   . Cataract 12/2006   Both eyes  Epes MD  . Chest pain, unspecified 09/04/2007   Lexiscan-- EF 72%; LV normal  . Constipation    Takes flax seed and honey .  Smooth Move tea.  . Depression   . Diverticulitis   . Head injury, closed    with fall  . Hypertension   . Mitral valve prolapse   . Osteopenia     Past Surgical History:  Procedure Laterality Date  . ANTERIOR CERVICAL DECOMP/DISCECTOMY FUSION N/A 10/23/2012   Procedure: Cervical Five to Cervical Six, Cervical Six to Cervical Seven anterior cervical decompression with fusion plating and bonegraft;  Surgeon: Hewitt Shortsobert W Nudelman, MD;  Location: [facility];  Service: Neurosurgery;  Laterality: N/A;  ANTERIOR CERVICAL DECOMPRESSION/DISCECTOMY FUSION 2 LEVELS  . BREAST CYST ASPIRATION  1990   benign  . CARDIAC CATHETERIZATION  08/01/2010   normal coronaries  . COLONOSCOPY  approx 2011    . EYE SURGERY Bilateral 2011  . intestinal blockage surgery  Feb. 2012   "kink in small intestine" per pt    Allergies  Allergen Reactions  . Contrast Media [Iodinated Diagnostic Agents] Hives    Hives during IVP at urology office '02, requires 13 hr prep///a.[email or web address]  Patient came thru ER and had a 1 hour pre-medication and did fine  . Oxycodone Other (See Comments)    Decreased appetite.  Sandrea Hammond. Boniva [Ibandronic Acid]     Nausea, vomiting, weakness  . Codeine Diarrhea and Nausea And Vomiting  . Darvocet [Propoxyphene N-Acetaminophen] Diarrhea and Nausea And Vomiting  . Erythromycin Nausea And Vomiting    Allergies as of 04/03/2018      Reactions   Contrast Media [iodinated Diagnostic Agents] Hives   Hives during IVP at urology office '02, requires 13 hr prep///a.[email or web address] Patient came thru ER and had a 1 hour pre-medication and did fine   Oxycodone Other (See Comments)   Decreased appetite.   Boniva [ibandronic Acid]    Nausea, vomiting, weakness   Codeine Diarrhea, Nausea And Vomiting   Darvocet [propoxyphene N-acetaminophen] Diarrhea, Nausea And Vomiting   Erythromycin Nausea And Vomiting      Medication List       Accurate as of April 03, 2018 11:59 PM. Always use your most recent med list.        acetaminophen 325  MG tablet Commonly known as:  TYLENOL Take 650 mg by mouth as needed (back pain).   acetaminophen 500 MG tablet Commonly known as:  TYLENOL Take 500 mg by mouth every morning.   alendronate 70 MG tablet Commonly known as:  FOSAMAX Take 1 tablet (70 mg total) by mouth every 7 (seven) days. Take with a full glass of water on an empty stomach.   amoxicillin 500 MG capsule Commonly known as:  AMOXIL Take 1 tablet before dental appointment.   calcium carbonate 600 MG Tabs tablet Commonly known as:  OS-CAL Take 1 tablet (600 mg total) by mouth 2 (two) times daily with a meal.   metoprolol tartrate 25 MG tablet Commonly known as:  LOPRESSOR Take 1  tablet (25 mg total) by mouth 2 (two) times daily.   STOOL SOFTENER 100 MG capsule Generic drug:  docusate sodium Take 100 mg by mouth at bedtime.   Vitamin D-3 125 MCG (5000 UT) Tabs Take 5,000 Units by mouth 3 (three) times a week. Takes on Monday, Wednesday, and Friday       Review of Systems:  Review of Systems  Constitutional: Positive for fatigue. Negative for activity change, appetite change, chills, diaphoresis and fever.  HENT: Positive for hearing loss. Negative for congestion and voice change.   Respiratory: Negative for cough, shortness of breath and wheezing.   Cardiovascular: Negative for chest pain, palpitations and leg swelling.  Gastrointestinal: Positive for abdominal pain. Negative for abdominal distention, blood in stool, constipation, diarrhea, nausea and vomiting.  Genitourinary: Negative for difficulty urinating, dysuria, hematuria and urgency.  Musculoskeletal: Positive for arthralgias, back pain and gait problem.       Chronic pain in the lower back/left hip region.   Skin: Negative for color change and pallor.  Neurological: Negative for dizziness, speech difficulty, weakness and headaches.  Psychiatric/Behavioral: Negative for agitation, behavioral problems, hallucinations and sleep disturbance. The patient is not nervous/anxious.     Health Maintenance  Topic Date Due  . INFLUENZA VACCINE  10/03/2017  . TETANUS/TDAP  07/28/2019  . DEXA SCAN  Completed  . PNA vac Low Risk Adult  Completed    Physical Exam: Vitals:   04/03/18 1233  BP: 116/72  Pulse: (!) 52  Resp: 18  Temp: 98 F (36.7 C)  TempSrc: Oral  SpO2: 98%  Weight: 151 lb 6.4 oz (68.7 kg)  Height: 5\' 6"  (1.676 m)   Body mass index is 24.44 kg/m. Physical Exam Constitutional:      Appearance: Normal appearance.  HENT:     Head: Normocephalic and atraumatic.     Nose: Nose normal.     Mouth/Throat:     Mouth: Mucous membranes are moist.  Eyes:     General: No scleral  icterus.    Extraocular Movements: Extraocular movements intact.     Pupils: Pupils are equal, round, and reactive to light.  Neck:     Musculoskeletal: Normal range of motion and neck supple.  Cardiovascular:     Rate and Rhythm: Normal rate.     Heart sounds: No murmur.  Pulmonary:     Breath sounds: No wheezing, rhonchi or rales.  Abdominal:     General: There is no distension.     Palpations: Abdomen is soft.     Tenderness: There is no abdominal tenderness. There is no guarding or rebound.     Comments: Mild discomfort when palpated. Negative Murphy sign.   Musculoskeletal:     Right lower leg: No edema.  Left lower leg: No edema.     Comments: Ambulates with walker.   Skin:    General: Skin is warm and dry.     Coloration: Skin is not jaundiced or pale.  Neurological:     General: No focal deficit present.     Mental Status: She is alert and oriented to person, place, and time. Mental status is at baseline.     Motor: No weakness.     Coordination: Coordination normal.     Gait: Gait abnormal.  Psychiatric:        Mood and Affect: Mood normal.        Behavior: Behavior normal.        Thought Content: Thought content normal.        Judgment: Judgment normal.     Labs reviewed: Basic Metabolic Panel: Recent Labs    05/10/17 07/16/17 0000 01/28/18 0740  NA 137 141 142  K 4.3 3.8 4.1  CL 101 106 106  CO2 31 31 29   GLUCOSE  --  93 90  BUN 16 14 20   CREATININE 0.9 0.88 0.75  CALCIUM 10.6 9.7 10.1  TSH  --  3.12  --    Liver Function Tests: Recent Labs    07/16/17 0000 01/28/18 0740  AST 24 18  ALT 15 12  BILITOT 0.5 0.4  PROT 7.0 6.6   No results for input(s): LIPASE, AMYLASE in the last 8760 hours. No results for input(s): AMMONIA in the last 8760 hours. CBC: Recent Labs    07/16/17 0000 01/28/18 0740  WBC 5.4 5.8  HGB 14.0 13.5  HCT 40.5 40.0  MCV 91.2 91.5  PLT 277 246   Lipid Panel: Recent Labs    07/16/17 0000 01/28/18 0740  CHOL  169 160  HDL 53 51  LDLCALC 98 93  TRIG 89 72  CHOLHDL 3.2 3.1   Lab Results  Component Value Date   HGBA1C  08/01/2010    5.4 (NOTE)                                                                       According to the ADA Clinical Practice Recommendations for 2011, when HbA1c is used as a screening test:   >=6.5%   Diagnostic of Diabetes Mellitus           (if abnormal result  is confirmed)  5.7-6.4%   Increased risk of developing Diabetes Mellitus  References:Diagnosis and Classification of Diabetes Mellitus,Diabetes Care,2011,34(Suppl 1):S62-S69 and Standards of Medical Care in         Diabetes - 2011,Diabetes Care,2011,34  (Suppl 1):S11-S61.    Procedures since last visit: No results found.  Assessment/Plan Diverticulitis of colon Rest, a liquid, gradual to  low fiber diet, delay anbiotics: Cipro with Metronidazole vs Augmenti vs Avelox.  Essential hypertension, benign Blood pressure is in control, continue Metoprolol 25mg  bid.   Constipation Stable, continue Colace 100mg  qd.     Labs/tests ordered: none  Next appt:  08/07/2018

## 2018-06-05 ENCOUNTER — Encounter: Payer: Self-pay | Admitting: Nurse Practitioner

## 2018-07-03 ENCOUNTER — Other Ambulatory Visit: Payer: Self-pay

## 2018-07-03 ENCOUNTER — Non-Acute Institutional Stay: Payer: Medicare Other | Admitting: Nurse Practitioner

## 2018-07-03 ENCOUNTER — Encounter: Payer: Self-pay | Admitting: Nurse Practitioner

## 2018-07-03 DIAGNOSIS — F320 Major depressive disorder, single episode, mild: Secondary | ICD-10-CM

## 2018-07-03 DIAGNOSIS — R5383 Other fatigue: Secondary | ICD-10-CM | POA: Diagnosis not present

## 2018-07-03 DIAGNOSIS — M810 Age-related osteoporosis without current pathological fracture: Secondary | ICD-10-CM

## 2018-07-03 DIAGNOSIS — M159 Polyosteoarthritis, unspecified: Secondary | ICD-10-CM

## 2018-07-03 DIAGNOSIS — I10 Essential (primary) hypertension: Secondary | ICD-10-CM | POA: Diagnosis not present

## 2018-07-03 NOTE — Assessment & Plan Note (Signed)
Update TSH

## 2018-07-03 NOTE — Progress Notes (Signed)
Location:   clinic [facility]   Place of Service:  Clinic (12) Provider: Marlana Latus NP  Code Status: DNR [place]als of Care[place] 07/03/2018  Does Patient Have a Medical Advance Directive? Yes  Type of Paramedic of [place];Living will  Does patient want to make changes to medical advance directive? No - Patient declined  Copy of [place] in Chart? Yes - validated most recent copy scanned in chart (See row information)  Pre-existing out of facility DNR order (yellow form or pink MOST form) -     Chief Complaint  Patient presents with  . Acute Visit    FATIGUE,Sleeping a lot this has been going on for 6 weeks, discuss metoprolol     HPI: Patient is a 84 y.o. female seen today for medical management of chronic diseases.     The patient has history of HTN, blood pressure is controlled on Metoprolol 49m bid. No constipation, on Docusate 1080mqd. Osteoporosis, on Alendronate 7059m week, , Vit D, Ca 600m18md. OA pain is managed with daily Tylenol 1300mg49m   Past Medical History:  Diagnosis Date  . Anginal pain (HCC) [place]21/2012   Echo-EF =>55%,LV normal  . Arthritis   . Cataract 12/2006   Both eyes  Epes MD  . Chest pain, unspecified 09/04/2007   Lexiscan-- EF 72%; LV normal  . Constipation    Takes flax seed and honey .  Smooth Move tea.  . Depression   . Diverticulitis   . Head injury, closed    with fall  . Hypertension   . Mitral valve prolapse   . Osteopenia     Past Surgical History:  Procedure Laterality Date  . ANTERIOR CERVICAL DECOMP/DISCECTOMY FUSION N/A 10/23/2012   Procedure: Cervical Five to Cervical Six, Cervical Six to Cervical Seven anterior cervical decompression with fusion plating and bonegraft;  Surgeon: RoberHosie Spangle  [facility];  Service: Neurosurgery;  Laterality: N/A;  ANTERIOR CERVICAL DECOMPRESSION/DISCECTOMY FUSION 2 LEVELS  . BREAST CYST ASPIRATION  1990   benign   . CARDIAC CATHETERIZATION  08/01/2010   normal coronaries  . COLONOSCOPY  approx 2011  . EYE SURGERY Bilateral 2011  . intestinal blockage surgery  Feb. 2012   "kink in small intestine" per pt    Allergies  Allergen Reactions  . Contrast Media [Iodinated Diagnostic Agents] Hives    Hives during IVP at urology office '02, requires 13 hr prep///a.[email or web address]  Patient came thru ER and had a 1 hour pre-medication and did fine  . Oxycodone Other (See Comments)    Decreased appetite.  . BonJaclyn Primendronic Acid]     Nausea, vomiting, weakness  . Codeine Diarrhea and Nausea And Vomiting  . Darvocet [Propoxyphene N-Acetaminophen] Diarrhea and Nausea And Vomiting  . Erythromycin Nausea And Vomiting    Allergies as of 07/03/2018      Reactions   Contrast Media [iodinated Diagnostic Agents] Hives   Hives during IVP at urology office '02, requires 13 hr prep///a.[email or web address] Patient came thru ER and had a 1 hour pre-medication and did fine   Oxycodone Other (See Comments)   Decreased appetite.   Boniva [ibandronic Acid]    Nausea, vomiting, weakness   Codeine Diarrhea, Nausea And Vomiting   Darvocet [propoxyphene N-acetaminophen] Diarrhea, Nausea And Vomiting   Erythromycin Nausea And Vomiting      Medication List       Accurate as of July 03, 2018 11:59 PM.  Always use your most recent med list.        acetaminophen 500 MG tablet Commonly known as:  TYLENOL Take 1,000 mg by mouth as needed. For neck pain   ACETAMINOPHEN PO Take 1,300 mg by mouth daily.   alendronate 70 MG tablet Commonly known as:  FOSAMAX Take 1 tablet (70 mg total) by mouth every 7 (seven) days. Take with a full glass of water on an empty stomach.   amoxicillin 500 MG capsule Commonly known as:  AMOXIL 2,000 mg. Take 4 tablet before dental appointment.   CALCIUM 600+D3 PO Take 600 mg by mouth 2 (two) times a day.   metoprolol tartrate 25 MG tablet Commonly known as:  LOPRESSOR Take 1 tablet (25 mg total)  by mouth 2 (two) times daily.   Stool Softener 100 MG capsule Generic drug:  docusate sodium Take 100 mg by mouth at bedtime.       Review of Systems:  Review of Systems  Constitutional: Positive for fatigue. Negative for activity change, appetite change, chills, diaphoresis, fever and unexpected weight change.       Same with 02/2018  HENT: Positive for hearing loss. Negative for congestion and voice change.   Eyes: Negative for visual disturbance.  Respiratory: Negative for cough, shortness of breath and wheezing.   Cardiovascular: Negative for chest pain, palpitations and leg swelling.  Gastrointestinal: Negative for abdominal distention, abdominal pain, constipation, diarrhea, nausea and vomiting.  Genitourinary: Negative for difficulty urinating, dysuria and urgency.  Musculoskeletal: Positive for back pain and gait problem.       Lower back/left hip region pain is chronic.   Skin: Negative for color change and pallor.  Neurological: Negative for dizziness, speech difficulty, weakness and headaches.  Psychiatric/Behavioral: Negative for agitation, behavioral problems, hallucinations and sleep disturbance. The patient is not nervous/anxious.     Health Maintenance  Topic Date Due  . INFLUENZA VACCINE  10/04/2018  . TETANUS/TDAP  07/28/2019  . DEXA SCAN  Completed  . PNA vac Low Risk Adult  Completed    Physical Exam: Vitals:   07/03/18 1527  BP: 118/80  Pulse: 73  Temp: 100 F (37.8 C)  TempSrc: Tympanic  SpO2: 94%  Weight: 145 lb 12.8 oz (66.1 kg)  Height: _0  (1.676 m)   Body mass index is 23.53 kg/m. Physical Exam Vitals signs reviewed.  Constitutional:      Appearance: Normal appearance.  HENT:     Head: Normocephalic and atraumatic.     Nose: Nose normal.     Mouth/Throat:     Mouth: Mucous membranes are moist.  Eyes:     Extraocular Movements: Extraocular movements intact.     Pupils: Pupils are equal, round, and reactive to light.  Neck:      Musculoskeletal: Normal range of motion and neck supple.  Cardiovascular:     Rate and Rhythm: Normal rate and regular rhythm.     Heart sounds: No murmur.  Pulmonary:     Effort: Pulmonary effort is normal.     Breath sounds: No wheezing, rhonchi or rales.  Abdominal:     General: There is no distension.     Palpations: Abdomen is soft.     Tenderness: There is no abdominal tenderness. There is no right CVA tenderness, left CVA tenderness, guarding or rebound.  Musculoskeletal:     Right lower leg: No edema.     Left lower leg: No edema.  Skin:    General: Skin is warm and  dry.  Neurological:     General: No focal deficit present.     Mental Status: She is alert. Mental status is at baseline.     Gait: Gait abnormal.  Psychiatric:        Mood and Affect: Mood normal.        Behavior: Behavior normal.        Thought Content: Thought content normal.        Judgment: Judgment normal.     Labs reviewed: Basic Metabolic Panel: Recent Labs    07/16/17 0000 01/28/18 0740  NA 141 142  K 3.8 4.1  CL 106 106  CO2 31 29  GLUCOSE 93 90  BUN 14 20  CREATININE 0.88 0.75  CALCIUM 9.7 10.1  TSH 3.12  --    Liver Function Tests: Recent Labs    07/16/17 0000 01/28/18 0740  AST 24 18  ALT 15 12  BILITOT 0.5 0.4  PROT 7.0 6.6   No results for input(s): LIPASE, AMYLASE in the last 8760 hours. No results for input(s): AMMONIA in the last 8760 hours. CBC: Recent Labs    07/16/17 0000 01/28/18 0740  WBC 5.4 5.8  HGB 14.0 13.5  HCT 40.5 40.0  MCV 91.2 91.5  PLT 277 246   Lipid Panel: Recent Labs    07/16/17 0000 01/28/18 0740  CHOL 169 160  HDL 53 51  LDLCALC 98 93  TRIG 89 72  CHOLHDL 3.2 3.1   Lab Results  Component Value Date   HGBA1C  08/01/2010    5.4 (NOTE)                                                                       According to the ADA Clinical Practice Recommendations for 2011, when HbA1c is used as a screening test:   >=6.5%   Diagnostic of  Diabetes Mellitus           (if abnormal result  is confirmed)  5.7-6.4%   Increased risk of developing Diabetes Mellitus  References:Diagnosis and Classification of Diabetes Mellitus,Diabetes JHER,7408,14(GYJEH 1):S62-S69 and Standards of Medical Care in         Diabetes - 2011,Diabetes Care,2011,34  (Suppl 1):S11-S61.    Procedures since last visit: No results found.  Assessment/Plan  Essential hypertension, benign Blood pressure is controlled, continue Metoprolol 52m bid, update CMP/eGFR, lipid panel.   Generalized osteoarthritis of multiple sites Stable, continue daily Tylenol 13082mqd.   Osteoporosis Continue Alendronate, Vit D, Ca. Update Vit D level, CBC/diff  Fatigue Update TSH  Depression On and off since age of [age] on antidepressant presently, all of them caused indigestion and constipation, not sleeping well and fatigue. Observe.    Labs/tests ordered: CBC/diff, CMP/eGFR, lipid panel, TSH, Vit D prior to appointment with Dr. GuLyndel Safe months.   Next appt:  08/07/2018

## 2018-07-03 NOTE — Assessment & Plan Note (Addendum)
Stable, continue daily Tylenol 1300mg  qd.

## 2018-07-03 NOTE — Assessment & Plan Note (Addendum)
Blood pressure is controlled, continue Metoprolol 8m bid, update CMP/eGFR, lipid panel.

## 2018-07-03 NOTE — Assessment & Plan Note (Signed)
On and off since age of 72s, not on antidepressant presently, all of them caused indigestion and constipation, not sleeping well and fatigue. Observe.

## 2018-07-03 NOTE — Assessment & Plan Note (Signed)
Continue Alendronate, Vit D, Ca. Update Vit D level, CBC/diff

## 2018-07-03 NOTE — Patient Instructions (Addendum)
CBC/diff, CMP/eGFR, Vit D, lipid panel next week,  appointment with Dr. Lyndel Safe 6 months.

## 2018-07-04 ENCOUNTER — Encounter: Payer: Self-pay | Admitting: Nurse Practitioner

## 2018-07-08 ENCOUNTER — Other Ambulatory Visit: Payer: Medicare Other

## 2018-07-08 ENCOUNTER — Other Ambulatory Visit: Payer: Self-pay | Admitting: Nurse Practitioner

## 2018-07-08 ENCOUNTER — Other Ambulatory Visit: Payer: Self-pay

## 2018-07-08 DIAGNOSIS — R5383 Other fatigue: Secondary | ICD-10-CM

## 2018-07-14 LAB — CBC WITH DIFFERENTIAL/PLATELET
Absolute Monocytes: 534 cells/uL (ref 200–950)
Basophils Absolute: 41 cells/uL (ref 0–200)
Basophils Relative: 0.7 %
Eosinophils Absolute: 365 cells/uL (ref 15–500)
Eosinophils Relative: 6.3 %
HCT: 41.7 % (ref 35.0–45.0)
Hemoglobin: 14.2 g/dL (ref 11.7–15.5)
Lymphs Abs: 1612 cells/uL (ref 850–3900)
MCH: 31.1 pg (ref 27.0–33.0)
MCHC: 34.1 g/dL (ref 32.0–36.0)
MCV: 91.4 fL (ref 80.0–100.0)
MPV: 10.9 fL (ref 7.5–12.5)
Monocytes Relative: 9.2 %
Neutro Abs: 3248 cells/uL (ref 1500–7800)
Neutrophils Relative %: 56 %
Platelets: 265 10*3/uL (ref 140–400)
RBC: 4.56 10*6/uL (ref 3.80–5.10)
RDW: 11.4 % (ref 11.0–15.0)
Total Lymphocyte: 27.8 %
WBC: 5.8 10*3/uL (ref 3.8–10.8)

## 2018-07-14 LAB — VITAMIN D 1,25 DIHYDROXY
Vitamin D 1, 25 (OH)2 Total: 28 pg/mL (ref 18–72)
Vitamin D2 1, 25 (OH)2: <8 pg/mL
Vitamin D3 1, 25 (OH)2: 28 pg/mL

## 2018-07-14 LAB — TSH: TSH: 4.19 mIU/L (ref 0.40–4.50)

## 2018-08-07 ENCOUNTER — Encounter: Payer: Self-pay | Admitting: Nurse Practitioner

## 2018-08-07 ENCOUNTER — Other Ambulatory Visit: Payer: Self-pay

## 2018-08-07 ENCOUNTER — Non-Acute Institutional Stay: Payer: Medicare Other | Admitting: Nurse Practitioner

## 2018-08-07 DIAGNOSIS — R634 Abnormal weight loss: Secondary | ICD-10-CM

## 2018-08-07 DIAGNOSIS — I10 Essential (primary) hypertension: Secondary | ICD-10-CM

## 2018-08-07 DIAGNOSIS — M159 Polyosteoarthritis, unspecified: Secondary | ICD-10-CM | POA: Diagnosis not present

## 2018-08-07 DIAGNOSIS — M810 Age-related osteoporosis without current pathological fracture: Secondary | ICD-10-CM

## 2018-08-07 DIAGNOSIS — F320 Major depressive disorder, single episode, mild: Secondary | ICD-10-CM

## 2018-08-07 DIAGNOSIS — K5900 Constipation, unspecified: Secondary | ICD-10-CM

## 2018-08-07 NOTE — Assessment & Plan Note (Signed)
Stable, continue diet and Colace nightly.

## 2018-08-07 NOTE — Assessment & Plan Note (Signed)
Denied GI symptoms, declined appetite stimulants.

## 2018-08-07 NOTE — Assessment & Plan Note (Addendum)
Stable,right hip/ lower back pain, Tylenol 1353m qd prn, update CMP/eGFR, LFT. PT to eval.

## 2018-08-07 NOTE — Assessment & Plan Note (Signed)
Continue Alendronate, Ca 

## 2018-08-07 NOTE — Progress Notes (Signed)
Location:   clinic [facility]   Place of Service:  Clinic (12)clinic [facility] Provider: Marlana Latus NP  Code Status: DNR Goals of Care: [place] 08/07/2018  Does Patient Have a Medical Advance Directive? Yes  Type of [place];Living will  Does patient want to make changes to medical advance directive? No - Patient declined  Copy of [place] in Chart? Yes - validated most recent copy scanned in chart (See row information)  Pre-existing out of facility DNR order (yellow form or pink MOST form) -     Chief Complaint  Patient presents with  . Acute Visit    fatigue, sleepy during the day, discuss lab results, weakness in legs  symptoms worse since last visit     HPI: Patient is a 84 y.o. female seen today for medical management of chronic diseases.    The patient has history of HTN, blood pressure is controlled on Metoprolol 46m bid. No constipation on diet and Colace 1053mqhs . Osteoporosis, no recent pathologic fxs, on Alendronate 7076m wk. 07/10/18 wbc 5.8, Hgb 14.2, plt 265, 01/28/18 Na 142, K 4.1, Bun 20, creat 0.75.    Past Medical History:  Diagnosis Date  . Anginal pain (HCCWillis6/21/2012   Echo-EF =>55%,LV normal  . Arthritis   . Cataract 12/2006   Both eyes  Epes MD  . Chest pain, unspecified 09/04/2007   Lexiscan-- EF 72%; LV normal  . Constipation    Takes flax seed and honey .  Smooth Move tea.  . Depression   . Diverticulitis   . Head injury, closed    with fall  . Hypertension   . Mitral valve prolapse   . Osteopenia     Past Surgical History:  Procedure Laterality Date  . ANTERIOR CERVICAL DECOMP/DISCECTOMY FUSION N/A 10/23/2012   Procedure: Cervical Five to Cervical Six, Cervical Six to Cervical Seven anterior cervical decompression with fusion plating and bonegraft;  Surgeon: RobHosie SpangleD;  [facility];  Service: Neurosurgery;  Laterality: N/A;  ANTERIOR CERVICAL  DECOMPRESSION/DISCECTOMY FUSION 2 LEVELS  . BREAST CYST ASPIRATION  1990   benign  . CARDIAC CATHETERIZATION  08/01/2010   normal coronaries  . COLONOSCOPY  approx 2011  . EYE SURGERY Bilateral 2011  . intestinal blockage surgery  Feb. 2012   "kink in small intestine" per pt    Allergies  Allergen Reactions  . Contrast Media [Iodinated Diagnostic Agents] Hives    Hives during IVP at urology office '02, requires 13 hr prep///a.[email or web address]  Patient came thru ER and had a 1 hour pre-medication and did fine  . Oxycodone Other (See Comments)    Decreased appetite.  . BJaclyn Primebandronic Acid]     Nausea, vomiting, weakness  . Codeine Diarrhea and Nausea And Vomiting  . Darvocet [Propoxyphene N-Acetaminophen] Diarrhea and Nausea And Vomiting  . Erythromycin Nausea And Vomiting    Allergies as of 08/07/2018      Reactions   Contrast Media [iodinated Diagnostic Agents] Hives   Hives during IVP at urology office '02, requires 13 hr prep///a.calhoun Patient came thru ER and had a 1 hour pre-medication and did fine   Oxycodone Other (See Comments)   Decreased appetite.   Boniva [ibandronic Acid]    Nausea, vomiting, weakness   Codeine Diarrhea, Nausea And Vomiting   Darvocet [propoxyphene N-acetaminophen] Diarrhea, Nausea And Vomiting   Erythromycin Nausea And Vomiting      Medication List  Accurate as of August 07, 2018 11:59 PM. If you have any questions, ask your nurse or doctor.        acetaminophen 500 MG tablet Commonly known as:  TYLENOL Take 1,000 mg by mouth as needed. For neck pain   ACETAMINOPHEN PO Take 1,300 mg by mouth daily.   alendronate 70 MG tablet Commonly known as:  FOSAMAX Take 1 tablet (70 mg total) by mouth every 7 (seven) days. Take with a full glass of water on an empty stomach.   amoxicillin 500 MG capsule Commonly known as:  AMOXIL 2,000 mg. Take 4 tablet before dental appointment.   CALCIUM 600+D3 PO Take 600 mg by mouth 2 (two) times a day.    metoprolol tartrate 25 MG tablet Commonly known as:  LOPRESSOR TAKE 1 TABLET BY MOUTH TWICE DAILY.   Stool Softener 100 MG capsule Generic drug:  docusate sodium Take 100 mg by mouth at bedtime.       Review of Systems:  Review of Systems  Constitutional: Positive for unexpected weight change. Negative for activity change, appetite change, chills, diaphoresis, fatigue and fever.       #4Ibs weight loss in the past month. Frequent daytime naps.  HENT: Positive for hearing loss. Negative for congestion and voice change.   Eyes: Negative for visual disturbance.  Respiratory: Negative for cough and shortness of breath.   Cardiovascular: Negative for chest pain, palpitations and leg swelling.  Gastrointestinal: Negative for abdominal distention, abdominal pain, constipation, diarrhea, nausea and vomiting.  Genitourinary: Positive for frequency. Negative for difficulty urinating, dysuria and urgency.       2-3x/night  Musculoskeletal: Positive for back pain and gait problem.       Right hip/SIJ, not traveling.   Skin: Negative for pallor.  Neurological: Negative for dizziness, speech difficulty, weakness and headaches.  Psychiatric/Behavioral: Positive for sleep disturbance. Negative for agitation, behavioral problems and hallucinations. The patient is not nervous/anxious.     Health Maintenance  Topic Date Due  . INFLUENZA VACCINE  10/04/2018  . TETANUS/TDAP  07/28/2019  . DEXA SCAN  Completed  . PNA vac Low Risk Adult  Completed    Physical Exam: Vitals:   08/07/18 1336  BP: 124/72  Pulse: (!) 56  Temp: 97.8 F (36.6 C)  SpO2: 97%  Weight: 141 lb 3.2 oz (64 kg)  Height: '5\' 6"'  (1.676 m)   Body mass index is 22.79 kg/m. Physical Exam Vitals signs and nursing note reviewed.  Constitutional:      General: She is not in acute distress.    Appearance: Normal appearance. She is not ill-appearing, toxic-appearing or diaphoretic.  HENT:     Head: Normocephalic and  atraumatic.     Nose: Nose normal. No congestion.     Mouth/Throat:     Mouth: Mucous membranes are moist.  Eyes:     Extraocular Movements: Extraocular movements intact.     Conjunctiva/sclera: Conjunctivae normal.     Pupils: Pupils are equal, round, and reactive to light.  Neck:     Musculoskeletal: Normal range of motion and neck supple.  Cardiovascular:     Rate and Rhythm: Normal rate and regular rhythm.     Heart sounds: No murmur.  Pulmonary:     Breath sounds: No wheezing, rhonchi or rales.  Abdominal:     General: Bowel sounds are normal. There is no distension.     Palpations: Abdomen is soft.     Tenderness: There is no abdominal tenderness. There is  no right CVA tenderness, left CVA tenderness or guarding.  Musculoskeletal:     Right lower leg: No edema.     Left lower leg: No edema.  Skin:    General: Skin is warm and dry.  Neurological:     General: No focal deficit present.     Mental Status: She is alert and oriented to person, place, and time. Mental status is at baseline.  Psychiatric:        Mood and Affect: Mood normal.        Behavior: Behavior normal.        Thought Content: Thought content normal.        Judgment: Judgment normal.     Labs reviewed: Basic Metabolic Panel: Recent Labs    01/28/18 0740 07/10/18 0710  NA 142  --   K 4.1  --   CL 106  --   CO2 29  --   GLUCOSE 90  --   BUN 20  --   CREATININE 0.75  --   CALCIUM 10.1  --   TSH  --  4.19   Liver Function Tests: Recent Labs    01/28/18 0740  AST 18  ALT 12  BILITOT 0.4  PROT 6.6   No results for input(s): LIPASE, AMYLASE in the last 8760 hours. No results for input(s): AMMONIA in the last 8760 hours. CBC: Recent Labs    01/28/18 0740 07/10/18 0710  WBC 5.8 5.8  NEUTROABS  --  3,248  HGB 13.5 14.2  HCT 40.0 41.7  MCV 91.5 91.4  PLT 246 265   Lipid Panel: Recent Labs    01/28/18 0740  CHOL 160  HDL 51  LDLCALC 93  TRIG 72  CHOLHDL 3.1   Lab Results   Component Value Date   HGBA1C  08/01/2010    5.4 (NOTE)                                                                       According to the ADA Clinical Practice Recommendations for 2011, when HbA1c is used as a screening test:   >=6.5%   Diagnostic of Diabetes Mellitus           (if abnormal result  is confirmed)  5.7-6.4%   Increased risk of developing Diabetes Mellitus  References:Diagnosis and Classification of Diabetes Mellitus,Diabetes OMVE,7209,47(SJGGE 1):S62-S69 and Standards of Medical Care in         Diabetes - 2011,Diabetes Care,2011,34  (Suppl 1):S11-S61.    Procedures since last visit: No results found.  Assessment/Plan  Essential hypertension, benign Blood pressure is controlled, continue Metoprolol 168m bid.   Osteoporosis Continue Alendronate, Ca  Constipation Stable, continue diet and Colace nightly.   Generalized osteoarthritis of multiple sites Stable,right hip/ lower back pain, Tylenol 13081mqd prn, update CMP/eGFR, LFT. PT to eval.   Depression Admitted depression, difficulty maintaining asleep at night, declined Mirtazapine 7.68m26msh, sleeping aids, antidepressants.   Weight loss Denied GI symptoms, declined appetite stimulants.    Labs/tests ordered:  CMP LFT prior to the next appointment  Next appt:  4 months with Dr. GupLyndel Safe

## 2018-08-07 NOTE — Patient Instructions (Signed)
F/u Dr Lyndel Safe 4 months, obtain CMP/eGFR, LFT prior to the appointment.

## 2018-08-07 NOTE — Assessment & Plan Note (Signed)
Blood pressure is controlled, continue Metoprolol 25mg bid.  

## 2018-08-07 NOTE — Assessment & Plan Note (Addendum)
Admitted depression, difficulty maintaining asleep at night, declined Mirtazapine 7.5mg  qsh, sleeping aids, antidepressants.

## 2018-09-22 ENCOUNTER — Other Ambulatory Visit: Payer: Self-pay | Admitting: Nurse Practitioner

## 2018-09-22 DIAGNOSIS — M810 Age-related osteoporosis without current pathological fracture: Secondary | ICD-10-CM

## 2018-10-21 ENCOUNTER — Other Ambulatory Visit: Payer: Self-pay | Admitting: Nurse Practitioner

## 2018-10-21 DIAGNOSIS — M810 Age-related osteoporosis without current pathological fracture: Secondary | ICD-10-CM

## 2018-11-18 ENCOUNTER — Other Ambulatory Visit: Payer: Self-pay | Admitting: Nurse Practitioner

## 2018-11-18 DIAGNOSIS — M810 Age-related osteoporosis without current pathological fracture: Secondary | ICD-10-CM

## 2018-11-28 ENCOUNTER — Other Ambulatory Visit: Payer: Self-pay | Admitting: *Deleted

## 2018-11-28 DIAGNOSIS — M159 Polyosteoarthritis, unspecified: Secondary | ICD-10-CM

## 2018-12-02 ENCOUNTER — Emergency Department (HOSPITAL_COMMUNITY): Payer: PRIVATE HEALTH INSURANCE

## 2018-12-02 ENCOUNTER — Emergency Department (HOSPITAL_COMMUNITY)
Admission: EM | Admit: 2018-12-02 | Discharge: 2018-12-03 | Disposition: A | Discharge status: Home / Self Care | Payer: PRIVATE HEALTH INSURANCE | Attending: Emergency Medicine | Admitting: Emergency Medicine

## 2018-12-02 ENCOUNTER — Other Ambulatory Visit: Payer: Self-pay

## 2018-12-02 ENCOUNTER — Encounter (HOSPITAL_COMMUNITY): Payer: Self-pay | Admitting: Emergency Medicine

## 2018-12-02 DIAGNOSIS — Z79899 Other long term (current) drug therapy: Secondary | ICD-10-CM | POA: Diagnosis not present

## 2018-12-02 DIAGNOSIS — H5461 Unqualified visual loss, right eye, normal vision left eye: Secondary | ICD-10-CM

## 2018-12-02 DIAGNOSIS — I10 Essential (primary) hypertension: Secondary | ICD-10-CM | POA: Insufficient documentation

## 2018-12-02 DIAGNOSIS — I639 Cerebral infarction, unspecified: Secondary | ICD-10-CM | POA: Insufficient documentation

## 2018-12-02 LAB — DIFFERENTIAL
Abs Immature Granulocytes: 0.01 10*3/uL (ref 0.00–0.07)
Basophils Absolute: 0.1 10*3/uL (ref 0.0–0.1)
Basophils Relative: 1 %
Eosinophils Absolute: 0.3 10*3/uL (ref 0.0–0.5)
Eosinophils Relative: 4 %
Immature Granulocytes: 0 %
Lymphocytes Relative: 21 %
Lymphs Abs: 1.5 10*3/uL (ref 0.7–4.0)
Monocytes Absolute: 0.6 10*3/uL (ref 0.1–1.0)
Monocytes Relative: 8 %
Neutro Abs: 5 10*3/uL (ref 1.7–7.7)
Neutrophils Relative %: 66 %

## 2018-12-02 LAB — CBG MONITORING, ED: Glucose-Capillary: 93 mg/dL (ref 70–99)

## 2018-12-02 LAB — COMPREHENSIVE METABOLIC PANEL
ALT: 16 U/L (ref 0–44)
AST: 31 U/L (ref 15–41)
Albumin: 4.5 g/dL (ref 3.5–5.0)
Alkaline Phosphatase: 57 U/L (ref 38–126)
Anion gap: 11 (ref 5–15)
BUN: 13 mg/dL (ref 8–23)
CO2: 26 mmol/L (ref 22–32)
Calcium: 10.3 mg/dL (ref 8.9–10.3)
Chloride: 103 mmol/L (ref 98–111)
Creatinine, Ser: 0.84 mg/dL (ref 0.44–1.00)
GFR calc Af Amer: >60 mL/min (ref >60)
GFR calc non Af Amer: >60 mL/min (ref >60)
Glucose, Bld: 106 mg/dL — ABNORMAL HIGH (ref 70–99)
Potassium: 3.8 mmol/L (ref 3.5–5.1)
Sodium: 140 mmol/L (ref 135–145)
Total Bilirubin: 0.9 mg/dL (ref 0.3–1.2)
Total Protein: 7.8 g/dL (ref 6.5–8.1)

## 2018-12-02 LAB — APTT: aPTT: 32 seconds (ref 24–36)

## 2018-12-02 LAB — PROTIME-INR
INR: 1 (ref 0.8–1.2)
Prothrombin Time: 13.6 seconds (ref 11.4–15.2)

## 2018-12-02 LAB — I-STAT CHEM 8, ED
BUN: 17 mg/dL (ref 8–23)
Calcium, Ion: 1.02 mmol/L — ABNORMAL LOW (ref 1.15–1.40)
Chloride: 110 mmol/L (ref 98–111)
Creatinine, Ser: 0.7 mg/dL (ref 0.44–1.00)
Glucose, Bld: 98 mg/dL (ref 70–99)
HCT: 45 % (ref 36.0–46.0)
Hemoglobin: 15.3 g/dL — ABNORMAL HIGH (ref 12.0–15.0)
Potassium: 4.2 mmol/L (ref 3.5–5.1)
Sodium: 139 mmol/L (ref 135–145)
TCO2: 26 mmol/L (ref 22–32)

## 2018-12-02 LAB — CBC
HCT: 45.2 % (ref 36.0–46.0)
Hemoglobin: 14.4 g/dL (ref 12.0–15.0)
MCH: 31.3 pg (ref 26.0–34.0)
MCHC: 31.9 g/dL (ref 30.0–36.0)
MCV: 98.3 fL (ref 80.0–100.0)
Platelets: 273 10*3/uL (ref 150–400)
RBC: 4.6 MIL/uL (ref 3.87–5.11)
RDW: 12 % (ref 11.5–15.5)
WBC: 7.5 10*3/uL (ref 4.0–10.5)
nRBC: 0 % (ref 0.0–0.2)

## 2018-12-02 MED ORDER — LORAZEPAM 1 MG PO TABS
0.5000 mg | ORAL_TABLET | Freq: Once | ORAL | Status: AC
  Administered 2018-12-03: 0.5 mg via ORAL
  Filled 2018-12-02: qty 1

## 2018-12-02 MED ORDER — SODIUM CHLORIDE 0.9% FLUSH: 3.0000 mL | Freq: Once | INTRAVENOUS | Status: DC

## 2018-12-02 NOTE — ED Provider Notes (Signed)
[facility] EMERGENCY DEPARTMENT Provider Note   CSN: [id number] Arrival date & time: 12/02/18  1749     History   Chief Complaint Chief Complaint  Patient presents with  . Loss of Vision    HPI Adriana [name] is a 84 y.o. female.     Patient with history of HTN presents for evaluation of vision loss. It started about 2 months ago with 'floaters' in the right eye she describes a colorful. She saw her eye doctor are reports having a normal exam. During the last 4 weeks, she is unsure how [place], she has developed loss of lateral vision in the right eye described as being unable to see half the page while reading. She saw her ophthalmologist today who reported a normal retinal/eye exam and sent her here to r/o stroke. No weakness of the extremities, significant headache, nausea.   The history is provided by the patient. No language interpreter was used.    Past Medical History:  Diagnosis Date  . Anginal pain (HCC) 08/24/2010   Echo-EF =>55%,LV normal  . Arthritis   . Cataract 12/2006   Both eyes  Epes MD  . Chest pain, unspecified 09/04/2007   Lexiscan-- EF 72%; LV normal  . Constipation    Takes flax seed and honey .  Smooth Move tea.  . Depression   . Diverticulitis   . Head injury, closed    with fall  . Hypertension   . Mitral valve prolapse   . Osteopenia     Patient Active Problem List   Diagnosis Date Noted  . Weight loss 08/07/2018  . MVP (mitral valve prolapse) 10/15/2017  . Constipation 07/11/2017  . Generalized osteoarthritis of multiple sites 07/11/2017  . Fatigue 07/11/2017  . Incontinent of urine 07/11/2017  . Localized swelling, mass or lump of neck 07/11/2017  . Osteoporosis 09/04/2012  . Depression 09/01/2012  . Essential hypertension, benign 09/01/2012  . C6 cervical fracture (HCC) 08/01/2012  . C7 cervical fracture (HCC) 08/01/2012  . Diverticulitis of colon 08/01/2012    Past Surgical History:  Procedure Laterality Date   . ANTERIOR CERVICAL DECOMP/DISCECTOMY FUSION N/A 10/23/2012   Procedure: Cervical Five to Cervical Six, Cervical Six to Cervical Seven anterior cervical decompression with fusion plating and bonegraft;  Surgeon: Hewitt Shorts, MD;  Location: [facility];  Service: Neurosurgery;  Laterality: N/A;  ANTERIOR CERVICAL DECOMPRESSION/DISCECTOMY FUSION 2 LEVELS  . BREAST CYST ASPIRATION  1990   benign  . CARDIAC CATHETERIZATION  08/01/2010   normal coronaries  . COLONOSCOPY  approx 2011  . EYE SURGERY Bilateral 2011  . intestinal blockage surgery  Feb. 2012   "kink in small intestine" per pt     OB History   No obstetric history on file.      Home Medications    Prior to Admission medications   Medication Sig Start Date End Date Taking? Authorizing Provider  acetaminophen (TYLENOL) 500 MG tablet Take 1,000 mg by mouth as needed. For neck pain    [provider]  ACETAMINOPHEN PO Take 1,300 mg by mouth daily.     [provider]  alendronate (FOSAMAX) 70 MG tablet TAKE 1 TAB ONCE A WEEK, AT LEAST 30 MIN BEFORE 1ST FOOD.DO NOT LIE DOWN FOR 30 MIN AFTER TAKING. 11/19/18   [name], [name], NP  amoxicillin (AMOXIL) 500 MG capsule 2,000 mg. Take 4 tablet before dental appointment. 07/31/17   [provider]  Calcium Carb-Cholecalciferol (CALCIUM 600+D3 PO) Take  600 mg by mouth 2 (two) times a day.    [provider]  docusate sodium (STOOL SOFTENER) 100 MG capsule Take 100 mg by mouth at bedtime.    [provider]  metoprolol tartrate (LOPRESSOR) 25 MG tablet TAKE 1 TABLET BY MOUTH TWICE DAILY. 07/08/18   [name], [name], NP    Family History Family History  Problem Relation Age of Onset  . Hypertension Mother   . Transient ischemic attack Mother   . Parkinson's disease Mother   . Dementia Mother   . CVA Mother   . Diabetes Father   . Coronary artery disease Father   . Heart failure Father   . Cancer Father        prostate  . Depression Father    . Congestive Heart Failure Father   . Prostate cancer Brother   . Diabetes Brother   . Prostate cancer Paternal Grandmother     Social History Social History   Tobacco Use  . Smoking status: Never Smoker  . Smokeless tobacco: Never Used  Substance Use Topics  . Alcohol use: Yes    Alcohol/week: 4.0 standard drinks    Types: 2 Glasses of wine, 2 Cans of beer per week    Comment: occasionally  . Drug use: No     Allergies   Contrast media [iodinated diagnostic agents], Oxycodone, Boniva [ibandronic acid], Codeine, Darvocet [propoxyphene n-acetaminophen], and Erythromycin   Review of Systems Review of Systems  Constitutional: Negative for chills and fever.  HENT: Negative.   Eyes: Positive for visual disturbance.  Respiratory: Negative.   Cardiovascular: Negative.   Gastrointestinal: Negative.   Musculoskeletal: Negative.   Skin: Negative.   Neurological: Negative.  Negative for syncope, speech difficulty, numbness and headaches.     Physical Exam Updated Vital Signs BP (!) 158/99   Pulse 68   Temp 98 F (36.7 C) (Oral)   Resp 16   Ht 5\' 6"  (1.676 m)   Wt 64 kg   SpO2 93%   BMI 22.77 kg/m   Physical Exam Vitals signs and nursing note reviewed.  Constitutional:      Appearance: She is well-developed.  HENT:     Head: Normocephalic.  Eyes:     Extraocular Movements: Extraocular movements intact.     Conjunctiva/sclera: Conjunctivae normal.  Neck:     Musculoskeletal: Normal range of motion and neck supple.     Vascular: No carotid bruit.  Cardiovascular:     Rate and Rhythm: Normal rate and regular rhythm.     Heart sounds: No murmur.  Pulmonary:     Effort: Pulmonary effort is normal.     Breath sounds: Normal breath sounds. No wheezing, rhonchi or rales.  Abdominal:     General: Bowel sounds are normal.     Palpations: Abdomen is soft.     Tenderness: There is no abdominal tenderness. There is no guarding or rebound.  Musculoskeletal: Normal  range of motion.  Skin:    General: Skin is warm and dry.     Findings: No rash.  Neurological:     Mental Status: She is alert and oriented to person, place, and time.     Comments: There is loss of right peripheral vision. Full movement of the eyes bilaterally. No lateralizing weakness.       ED Treatments / Results  Labs (all labs ordered are listed, but only abnormal results are displayed) Labs Reviewed  COMPREHENSIVE METABOLIC PANEL - Abnormal; Notable for the following  components:      Result Value   Glucose, Bld 106 (*)    All other components within normal limits  I-STAT CHEM 8, ED - Abnormal; Notable for the following components:   Calcium, Ion 1.02 (*)    Hemoglobin 15.3 (*)    All other components within normal limits  PROTIME-INR  APTT  CBC  DIFFERENTIAL  CBG MONITORING, ED  CBG MONITORING, ED    EKG None  Radiology Ct Head Wo Contrast  Result Date: 12/02/2018 CLINICAL DATA:  Diplopia. Possible stroke. EXAM: CT HEAD WITHOUT CONTRAST TECHNIQUE: Contiguous axial images were obtained from the base of the skull through the vertex without intravenous contrast. COMPARISON:  Head CT and brain MRI 08/01/2012 FINDINGS: Brain: No evidence of acute infarction, hemorrhage, hydrocephalus, extra-axial collection or mass lesion/mass effect. Stable degree of atrophy with moderate chronic small vessel ischemia, not significantly progressed from prior exam on CT. Vascular: Atherosclerosis of skullbase vasculature without hyperdense vessel or abnormal calcification. Skull: No fracture or focal lesion. Sinuses/Orbits: Paranasal sinuses and mastoid air cells are clear. The visualized orbits are unremarkable. Bilateral lens extraction. Other: None. IMPRESSION: 1. No acute intracranial abnormality. 2. Stable atrophy and moderate chronic small vessel ischemia. Electronically Signed   By: Narda RutherfordMelanie  Sanford M.D.   On: 12/02/2018 20:19    Procedures Procedures (including critical care time)   Medications Ordered in ED Medications  sodium chloride flush (NS) 0.9 % injection 3 mL (has no administration in time range)     Initial Impression / Assessment and Plan / ED Course  I have reviewed the triage vital signs and the nursing notes.  Pertinent labs & imaging results that were available during my care of the patient were reviewed by me and considered in my medical decision making (see chart for details).        Patient to ED for evaluation of vision disturbances that include color floaters 2 months ago, now having complete lateral peripheral vision loss in the right eye. Seen by ophthalmology today with normal eye exam as reported by patient. She was referred here by ophthalmology for r/o stroke.   MRI/MRA ordered. Patient is comfortable and has no needs currently.  5:50 - Study ordered at 11:20 pm 12/02/18 and delayed due to census in the department. She is being transported now to MRI.  Patient care signed out to oncoming provider pending MRI/MRA results.   Final Clinical Impressions(s) / ED Diagnoses   Final diagnoses:  None   1. Right vision loss  ED Discharge Orders    None       Elpidio AnisUpstill, Myking Sar, PA-C 12/03/18 [id number]    Vanetta MuldersZackowski, Scott, MD 12/15/18 [phone]

## 2018-12-02 NOTE — ED Notes (Signed)
CBG collected. Result "93."

## 2018-12-02 NOTE — ED Triage Notes (Addendum)
Pt transported by EMS from [facility] for stroke workup. Pt states she has periperal vision loss in R eye for several months, started as floaters, pt reports loss is not complete, at times its blurred.  denies curtain loss. Pt is A & O, neuro intact.  Pt also c/o recent generalized feeling of weakness, more in lower extremities. Pt states opth reports retinas are intact.  Pt resides at [facility]

## 2018-12-02 NOTE — ED Notes (Signed)
Please call daughter, Glennis Brink, with updates - [phone]. She is on her way to ED.

## 2018-12-03 ENCOUNTER — Emergency Department (HOSPITAL_COMMUNITY): Payer: PRIVATE HEALTH INSURANCE

## 2018-12-03 ENCOUNTER — Encounter (HOSPITAL_COMMUNITY): Payer: Self-pay | Admitting: *Deleted

## 2018-12-03 DIAGNOSIS — I639 Cerebral infarction, unspecified: Secondary | ICD-10-CM | POA: Diagnosis not present

## 2018-12-03 MED ORDER — ASPIRIN 81 MG PO CHEW: 81.0000 mg | CHEWABLE_TABLET | Freq: Every day | ORAL | 0 refills | Status: DC

## 2018-12-03 NOTE — ED Provider Notes (Signed)
  Physical Exam  BP 133/90   Pulse 63   Temp 98 F (36.7 C) (Oral)   Resp 15   Ht 5\' 6"  (1.676 m)   Wt 64 kg   SpO2 94%   BMI 22.77 kg/m   Physical Exam  Gen: appears nontoxic  ED Course/Procedures     Procedures  MDM   Patient signed out to me by Sharlene Motts, PA-C.  Please see previous notes for further history.  In brief, patient presenting for evaluation of right-sided vision loss.  This is been going for an unknown amount of time, but likely several weeks.  Patient saw her ophthalmologist today, who found no acute abnormality of the eye itself, recommended she come to the ER for rule out stroke.  Labs overall reassuring.  MRI and MRA pending.  MRI/MRA shows subacute stroke.  Will consult with neurology to see if patient should be admitted for stroke work-up, or due to several weeks of symptoms, can follow-up outpatient.  Discussed with Dr. Leonel Ramsay from neurology who recommends discharge and follow-up outpatient.  Recommend starting patient on aspirin.  As symptoms have been present for several weeks, he does not feel admission to the hospital will be beneficial.  I discussed findings and plan with patient, who is agreeable.  Stressed importance of taking aspirin daily.  Stressed importance of follow-up with neurology outpatient.  At this time, patient appears safe for discharge.  Return precautions given.  Patient states she understands and agrees to plan.       Franchot Heidelberg, PA-C 12/03/18 0810    Quintella Reichert, MD 12/04/18 [phone]

## 2018-12-03 NOTE — Discharge Instructions (Signed)
Start taking aspirin every day. Continue taking all your other medications as prescribed. You will need to follow-up with neurology in the clinic.  The information is listed below.  Call to set up an appointment. Return to the emergency room if you develop any worsening weakness or deficits.  Return if you have speech difficulty, weakness or numbness of your arms or legs, or with any new, worsening, concerning symptoms.

## 2018-12-03 NOTE — ED Provider Notes (Signed)
Medical screening examination/treatment/procedure(s) were conducted as a shared visit with non-physician practitioner(s) and myself.  I personally evaluated the patient during the encounter.  EKG Interpretation  Date/Time:  Wednesday December 03 2018 02:16:15 EDT Ventricular Rate:  75 PR Interval:    QRS Duration: 77 QT Interval:  382 QTC Calculation: 427 R Axis:   28 Text Interpretation:  Sinus rhythm Low voltage, precordial leads Borderline T abnormalities, anterior leads No significant change since last tracing Confirmed by Pryor Curia [phone]) on 12/03/2018 2:20:31 AM    Patient is an 84 year old female who presents to the emergency department with several weeks of right peripheral vision loss.  Seen by ophthalmology who sent her here to rule out stroke.  MRI pending.  Signed out to oncoming ED team to follow-up on MRI results.  If subacute, chronic findings, anticipate discharge home with outpatient neurologic follow-up.   Richar Dunklee, Delice Bison, DO 12/04/18 [phone]

## 2018-12-04 ENCOUNTER — Other Ambulatory Visit: Payer: Self-pay

## 2018-12-04 ENCOUNTER — Encounter: Payer: Self-pay | Admitting: Nurse Practitioner

## 2018-12-04 ENCOUNTER — Non-Acute Institutional Stay: Payer: Medicare Other | Admitting: Nurse Practitioner

## 2018-12-04 DIAGNOSIS — I10 Essential (primary) hypertension: Secondary | ICD-10-CM

## 2018-12-04 DIAGNOSIS — K5900 Constipation, unspecified: Secondary | ICD-10-CM

## 2018-12-04 DIAGNOSIS — I639 Cerebral infarction, unspecified: Secondary | ICD-10-CM | POA: Diagnosis not present

## 2018-12-04 MED ORDER — LISINOPRIL 5 MG PO TABS: 5.0000 mg | ORAL_TABLET | Freq: Every day | ORAL | 2 refills | Status: DC

## 2018-12-04 NOTE — Assessment & Plan Note (Signed)
Stable, continue Colace.  

## 2018-12-04 NOTE — Progress Notes (Signed)
Location:   clinic [facility]   Place of Service:  Clinic (12) Provider: Chipper Oman NP  Code Status: DNR Goals of Care: [place] 08/07/2018  Does Patient Have a Medical Advance Directive? Yes  Type of Estate agent of [place];Living will  Does patient want to make changes to medical advance directive? No - Patient declined  Copy of Healthcare Power of Attorney in Chart? Yes - validated most recent copy scanned in chart (See row information)  Pre-existing out of facility DNR order (yellow form or pink MOST form) -     Chief Complaint  Patient presents with  . Medical Management of Chronic Issues    Sudden hospital visit    HPI: Patient is a 84 y.o. female seen today for f/u ED visit 12/02/18 for loss of vision R side for several weeks, saw Ophthalmology, no acute abnormality, ED CVA workup, MRI MRA showed subacute stroke, f/u Neurology as out patient 12/08/18(12/03/18 MRASmall subacute left occipital cortex infarct associated with severe narrowing at the left P3/P4 junction) , started ASA in ED. No further left side HA, denied focal weakness, chest pain/pressure, palpitation. Hx of HTN, blood pressure is mildly elevated in 140-150/80-90s, on  Metoprolol  bid. Constipation, stable, taking Colace  qd.   Past Medical History:  Diagnosis Date  . Anginal pain (HCC) 08/24/2010   Echo-EF =>55%,LV normal  . Arthritis   . Cataract 12/2006   Both eyes  Epes MD  . Chest pain, unspecified 09/04/2007   Lexiscan-- EF 72%; LV normal  . Constipation    Takes flax seed and honey .  Smooth Move tea.  . Depression   . Diverticulitis   . Head injury, closed    with fall  . Hypertension   . Mitral valve prolapse   . Osteopenia     Past Surgical History:  Procedure Laterality Date  . ANTERIOR CERVICAL DECOMP/DISCECTOMY FUSION N/A 10/23/2012   Procedure: Cervical Five to Cervical Six, Cervical Six to Cervical Seven anterior cervical decompression with  fusion plating and bonegraft;  Surgeon: Hewitt Shorts, MD;  Location: [facility];  Service: Neurosurgery;  Laterality: N/A;  ANTERIOR CERVICAL DECOMPRESSION/DISCECTOMY FUSION 2 LEVELS  . BREAST CYST ASPIRATION  1990   benign  . CARDIAC CATHETERIZATION  08/01/2010   normal coronaries  . COLONOSCOPY  approx 2011  . EYE SURGERY Bilateral 2011  . intestinal blockage surgery  Feb. 2012   "kink in small intestine" per pt    Allergies  Allergen Reactions  . Contrast Media [Iodinated Diagnostic Agents] Hives    Hives during IVP at urology office '02, requires 13 hr prep///[email or web address]  Patient came thru ER and had a 1 hour pre-medication and did fine  . Oxycodone Other (See Comments)    Decreased appetite.  Sandrea Hammond [Ibandronic Acid]     Nausea, vomiting, weakness  . Codeine Diarrhea and Nausea And Vomiting  . Darvocet [Propoxyphene N-Acetaminophen] Diarrhea and Nausea And Vomiting  . Erythromycin Nausea And Vomiting    Allergies as of 12/04/2018      Reactions   Contrast Media [iodinated Diagnostic Agents] Hives   Hives during IVP at urology office '02, requires 13 hr prep///[email or web address] Patient came thru ER and had a 1 hour pre-medication and did fine   Oxycodone Other (See Comments)   Decreased appetite.   Boniva [ibandronic Acid]    Nausea, vomiting, weakness   Codeine Diarrhea, Nausea And Vomiting   Darvocet [propoxyphene N-acetaminophen] Diarrhea, Nausea And Vomiting  Erythromycin Nausea And Vomiting      Medication List       Accurate as of December 04, 2018 11:59 PM. If you have any questions, ask your nurse or doctor.        acetaminophen 500 MG tablet Commonly known as: TYLENOL Take 1,000 mg by mouth every 6 (six) hours as needed for mild pain. For neck pain   alendronate 70 MG tablet Commonly known as: FOSAMAX TAKE 1 TAB ONCE A WEEK, AT LEAST 30 MIN BEFORE 1ST FOOD.DO NOT LIE DOWN FOR 30 MIN AFTER TAKING. What changed: See the new instructions.   amoxicillin  500 MG capsule Commonly known as: AMOXIL Take 2,000 mg by mouth See admin instructions. Take 4 tablet before dental appointment.   aspirin 81 MG chewable tablet Chew 1 tablet (81 mg total) by mouth daily.   CALCIUM 600+D3 PO Take 600 mg by mouth 2 (two) times a day.   lisinopril 5 MG tablet Commonly known as: ZESTRIL Take 1 tablet (5 mg total) by mouth daily. Started by: Juliann Olesky X Tarica Harl, NP   metoprolol tartrate 25 MG tablet Commonly known as: LOPRESSOR TAKE 1 TABLET BY MOUTH TWICE DAILY.   Stool Softener 100 MG capsule Generic drug: docusate sodium Take 100 mg by mouth at bedtime.       Review of Systems:  Review of Systems  Constitutional: Negative for activity change, appetite change, chills, diaphoresis, fatigue, fever and unexpected weight change.  HENT: Positive for hearing loss. Negative for congestion and voice change.   Eyes: Positive for visual disturbance.       Lateral right peripheral vision loss  Respiratory: Negative for cough, shortness of breath and wheezing.   Cardiovascular: Negative for chest pain, palpitations and leg swelling.  Gastrointestinal: Negative for abdominal distention, abdominal pain, constipation, diarrhea, nausea and vomiting.  Genitourinary: Positive for frequency. Negative for difficulty urinating, dysuria and urgency.       3-4 x/night.   Musculoskeletal: Positive for back pain and gait problem.       Right lower back, mostly in am  Skin: Negative for color change and pallor.  Neurological: Negative for dizziness, speech difficulty, weakness and headaches.  Psychiatric/Behavioral: Positive for sleep disturbance. Negative for agitation, behavioral problems and hallucinations. The patient is not nervous/anxious.     Health Maintenance  Topic Date Due  . INFLUENZA VACCINE  10/04/2018  . TETANUS/TDAP  07/28/2019  . DEXA SCAN  Completed  . PNA vac Low Risk Adult  Completed    Physical Exam: Vitals:   12/04/18 1557  BP: (!) 142/88   Pulse: (!) 54  Temp: (!) 96.8 F (36 C)  SpO2: 98%  Weight: 137 lb 12.8 oz (62.5 kg)  Height: 5\' 6"  (1.676 m)   Body mass index is 22.24 kg/m. Physical Exam Vitals signs reviewed.  Constitutional:      General: She is not in acute distress.    Appearance: Normal appearance. She is not ill-appearing, toxic-appearing or diaphoretic.  HENT:     Head: Normocephalic and atraumatic.     Nose: Nose normal.     Mouth/Throat:     Mouth: Mucous membranes are moist.  Eyes:     Extraocular Movements: Extraocular movements intact.     Conjunctiva/sclera: Conjunctivae normal.     Pupils: Pupils are equal, round, and reactive to light.     Comments: Vision loss lateral right eye  Neck:     Musculoskeletal: Normal range of motion and neck supple.  Cardiovascular:  Rate and Rhythm: Normal rate and regular rhythm.     Heart sounds: No murmur.  Pulmonary:     Effort: Pulmonary effort is normal.     Breath sounds: No wheezing, rhonchi or rales.  Abdominal:     General: Bowel sounds are normal. There is no distension.     Palpations: Abdomen is soft.     Tenderness: There is no abdominal tenderness. There is no right CVA tenderness, left CVA tenderness, guarding or rebound.  Musculoskeletal:     Right lower leg: No edema.     Left lower leg: No edema.  Skin:    General: Skin is warm and dry.  Neurological:     General: No focal deficit present.     Mental Status: She is alert and oriented to person, place, and time. Mental status is at baseline.     Cranial Nerves: No cranial nerve deficit.     Motor: No weakness.     Coordination: Coordination normal.     Gait: Gait abnormal.  Psychiatric:        Mood and Affect: Mood normal.        Behavior: Behavior normal.        Thought Content: Thought content normal.        Judgment: Judgment normal.     Labs reviewed: Basic Metabolic Panel: Recent Labs    01/28/18 0740 07/10/18 0710 12/02/18 1809 12/02/18 1829  NA 142  --  140  139  K 4.1  --  3.8 4.2  CL 106  --  103 110  CO2 29  --  26  --   GLUCOSE 90  --  106* 98  BUN 20  --  13 17  CREATININE 0.75  --  0.84 0.70  CALCIUM 10.1  --  10.3  --   TSH  --  4.19  --   --    Liver Function Tests: Recent Labs    01/28/18 0740 12/02/18 1809  AST 18 31  ALT 12 16  ALKPHOS  --  57  BILITOT 0.4 0.9  PROT 6.6 7.8  ALBUMIN  --  4.5   No results for input(s): LIPASE, AMYLASE in the last 8760 hours. No results for input(s): AMMONIA in the last 8760 hours. CBC: Recent Labs    01/28/18 0740 07/10/18 0710 12/02/18 1809 12/02/18 1829  WBC 5.8 5.8 7.5  --   NEUTROABS  --  3,248 5.0  --   HGB 13.5 14.2 14.4 15.3*  HCT 40.0 41.7 45.2 45.0  MCV 91.5 91.4 98.3  --   PLT 246 265 273  --    Lipid Panel: Recent Labs    01/28/18 0740  CHOL 160  HDL 51  LDLCALC 93  TRIG 72  CHOLHDL 3.1   Lab Results  Component Value Date   HGBA1C  08/01/2010    5.4 (NOTE)                                                                       According to the ADA Clinical Practice Recommendations for 2011, when HbA1c is used as a screening test:   >=6.5%   Diagnostic of Diabetes Mellitus           (  if abnormal result  is confirmed)  5.7-6.4%   Increased risk of developing Diabetes Mellitus  References:Diagnosis and Classification of Diabetes Mellitus,Diabetes EQAS,3419,62(IWLNL 1):S62-S69 and Standards of Medical Care in         Diabetes - 2011,Diabetes GXQJ,1941,74  (Suppl 1):S11-S61.    Procedures since last visit: Ct Head Wo Contrast  Result Date: 12/02/2018 CLINICAL DATA:  Diplopia. Possible stroke. EXAM: CT HEAD WITHOUT CONTRAST TECHNIQUE: Contiguous axial images were obtained from the base of the skull through the vertex without intravenous contrast. COMPARISON:  Head CT and brain MRI 08/01/2012 FINDINGS: Brain: No evidence of acute infarction, hemorrhage, hydrocephalus, extra-axial collection or mass lesion/mass effect. Stable degree of atrophy with moderate chronic  small vessel ischemia, not significantly progressed from prior exam on CT. Vascular: Atherosclerosis of skullbase vasculature without hyperdense vessel or abnormal calcification. Skull: No fracture or focal lesion. Sinuses/Orbits: Paranasal sinuses and mastoid air cells are clear. The visualized orbits are unremarkable. Bilateral lens extraction. Other: None. IMPRESSION: 1. No acute intracranial abnormality. 2. Stable atrophy and moderate chronic small vessel ischemia. Electronically Signed   By: Keith Rake M.D.   On: 12/02/2018 20:19   Mr Angio Head Wo Contrast  Result Date: 12/03/2018 CLINICAL DATA:  Subacute neuro deficit. Vision loss beginning 2 months ago EXAM: MRI HEAD WITHOUT CONTRAST MRA HEAD WITHOUT CONTRAST TECHNIQUE: Multiplanar, multiecho pulse sequences of the brain and surrounding structures were obtained without intravenous contrast. Angiographic images of the head were obtained using MRA technique without contrast. COMPARISON:  Head CT from yesterday.  Brain MRI 08/01/2012 FINDINGS: MRI HEAD FINDINGS Brain: Weakly restricted diffusion along the left occipital cortex involving a small area. Confluent FLAIR hyperintensity in the deep cerebral white matter. Moderate patchy FLAIR hyperintensity in the pons. This chronic small vessel ischemia is essentially stable from 2014. Age normal brain volume. No hemorrhage, hydrocephalus, or masslike finding. Vascular: Normal flow voids Skull and upper cervical spine: Negative for marrow lesion Sinuses/Orbits: Bilateral cataract resection.  No acute finding MRA HEAD FINDINGS Carotid, vertebral, and basilar arteries are widely patent. Apparent bilateral ICA irregularity at the skull base is a commonly encountered artifact. Severe narrowing at the left P3-4 segment with flow gap and downstream underfilling. Early branching left MCA. No aneurysm or vascular malformation IMPRESSION: 1. Small subacute left occipital cortex infarct associated with severe  narrowing at the left P3/P4 junction. 2. Generalized chronic small vessel ischemia that is similar to 2014. Electronically Signed   By: Monte Fantasia M.D.   On: 12/03/2018 07:22   Mr Brain Wo Contrast  Result Date: 12/03/2018 CLINICAL DATA:  Subacute neuro deficit. Vision loss beginning 2 months ago EXAM: MRI HEAD WITHOUT CONTRAST MRA HEAD WITHOUT CONTRAST TECHNIQUE: Multiplanar, multiecho pulse sequences of the brain and surrounding structures were obtained without intravenous contrast. Angiographic images of the head were obtained using MRA technique without contrast. COMPARISON:  Head CT from yesterday.  Brain MRI 08/01/2012 FINDINGS: MRI HEAD FINDINGS Brain: Weakly restricted diffusion along the left occipital cortex involving a small area. Confluent FLAIR hyperintensity in the deep cerebral white matter. Moderate patchy FLAIR hyperintensity in the pons. This chronic small vessel ischemia is essentially stable from 2014. Age normal brain volume. No hemorrhage, hydrocephalus, or masslike finding. Vascular: Normal flow voids Skull and upper cervical spine: Negative for marrow lesion Sinuses/Orbits: Bilateral cataract resection.  No acute finding MRA HEAD FINDINGS Carotid, vertebral, and basilar arteries are widely patent. Apparent bilateral ICA irregularity at the skull base is a commonly encountered artifact. Severe narrowing at  the left P3-4 segment with flow gap and downstream underfilling. Early branching left MCA. No aneurysm or vascular malformation IMPRESSION: 1. Small subacute left occipital cortex infarct associated with severe narrowing at the left P3/P4 junction. 2. Generalized chronic small vessel ischemia that is similar to 2014. Electronically Signed   By: Marnee SpringJonathon  Watts M.D.   On: 12/03/2018 07:22    Assessment/Plan Essential hypertension, benign Better control blood pressure, add Lisinopril 5mg  qd, continue Metoprolol 25mg  bid po.   CVA (cerebral vascular accident) (HCC) R eye vision  loss, continue ASA, update lipid panel. F/u neurology 12/08/18. Carotid artery US maybe needed.   Constipation Stable, continue Colace.     Labs/tests ordered: lipid panel.   Next appt:  12/30/2018

## 2018-12-04 NOTE — Assessment & Plan Note (Addendum)
R eye vision loss, continue ASA, update lipid panel. F/u neurology 12/08/18. Carotid artery [place] maybe needed.

## 2018-12-04 NOTE — Patient Instructions (Addendum)
Will add Lisinopril 5mg  daily for better blood pressure control. Continue blood pressure monitoring. Update lipid panel in setting of subacute stroke. F/u neurology 12/08/18. Continue diet, exercise, hydration for life style modification.

## 2018-12-04 NOTE — Assessment & Plan Note (Addendum)
Better control blood pressure, add Lisinopril 5mg  qd, continue Metoprolol 25mg  bid po.

## 2018-12-08 ENCOUNTER — Ambulatory Visit: Payer: Medicare Other | Admitting: Diagnostic Neuroimaging

## 2018-12-08 ENCOUNTER — Encounter: Payer: Self-pay | Admitting: Diagnostic Neuroimaging

## 2018-12-08 ENCOUNTER — Other Ambulatory Visit: Payer: Self-pay

## 2018-12-08 VITALS — BP 163/90 | HR 58 | Temp 97.8°F | Ht 62.0 in | Wt 139.0 lb

## 2018-12-08 DIAGNOSIS — I639 Cerebral infarction, unspecified: Secondary | ICD-10-CM

## 2018-12-08 MED ORDER — ATORVASTATIN CALCIUM 20 MG PO TABS: 20.0000 mg | ORAL_TABLET | Freq: Every day | ORAL | 3 refills | Status: DC

## 2018-12-08 NOTE — Patient Instructions (Signed)
LEFT OCCIPITAL STROKE - continue aspirin 81mg  daily  - start atorvastatin 20mg ; repeat lipid panel with PCP; goal LDL < 70 - continue BP control - check ultrasound of heart and cardiac monitor (30 day)

## 2018-12-08 NOTE — Progress Notes (Signed)
[facility]  PATIENT: Adriana [name] DOB: 26-Jan-1935  REFERRING CLINICIAN: ER / [name], Man HISTORY FROM: patient and daughter  REASON FOR VISIT: new consult    HISTORICAL  CHIEF COMPLAINT:  Chief Complaint  Patient presents with  . Cerebrovascular Accident    rm 7 New Pt, dgtr- Almyra Free, "seen in ED on 12/02/18 for right vision loss, ED work up for stroke"    HISTORY OF PRESENT ILLNESS:   84 year old female here for evaluation of stroke.  2 to 3 weeks ago patient had onset of blurred vision and loss of vision on the right side, mainly affecting her right eye.  Patient went to eye doctor for evaluation was noted to have right homonymous hemianopsia, right inferior quadrant.  Patient referred to the emergency room for evaluation.  MRI of the brain confirmed a subacute left occipital ischemic infarct.  Also noted to have left posterior cerebral artery P3 segment stenosis.  Patient was started on aspirin 81 mg daily and referred to neurology clinic.  Since that time symptoms are stable.  No numbness or weakness on the right side of her body.  She has noted some emotional lability and balance issues since this time.  She has had some left-sided headaches.  Patient has high blood pressure is on medical therapy.   REVIEW OF SYSTEMS: Full 14 system review of systems performed and negative with exception of: As per HPI.  ALLERGIES: Allergies  Allergen Reactions  . Contrast Media [Iodinated Diagnostic Agents] Hives    Hives during IVP at urology office '02, requires 13 hr prep///a.calhoun  Patient came thru ER and had a 1 hour pre-medication and did fine  . Oxycodone Other (See Comments)    Decreased appetite.  Jaclyn Prime [Ibandronic Acid]     Nausea, vomiting, weakness  . Codeine Diarrhea and Nausea And Vomiting  . Darvocet [Propoxyphene N-Acetaminophen] Diarrhea and Nausea And Vomiting  . Erythromycin Nausea And Vomiting    HOME MEDICATIONS: Outpatient Medications  Prior to Visit  Medication Sig Dispense Refill  . acetaminophen (TYLENOL) 500 MG tablet Take 1,000 mg by mouth every 6 (six) hours as needed for mild pain. For neck pain     . alendronate (FOSAMAX) 70 MG tablet TAKE 1 TAB ONCE A WEEK, AT LEAST 30 MIN BEFORE 1ST FOOD.DO NOT LIE DOWN FOR 30 MIN AFTER TAKING. (Patient taking differently: Take 70 mg by mouth every Thursday. ) 4 tablet 0  . aspirin 81 MG chewable tablet Chew 1 tablet (81 mg total) by mouth daily. 30 tablet 0  . Calcium Carb-Cholecalciferol (CALCIUM 600+D3 PO) Take 600 mg by mouth 2 (two) times a day.    . docusate sodium (STOOL SOFTENER) 100 MG capsule Take 100 mg by mouth at bedtime.    . metoprolol tartrate (LOPRESSOR) 25 MG tablet TAKE 1 TABLET BY MOUTH TWICE DAILY. (Patient taking differently: Take 25 mg by mouth 2 (two) times daily. ) 180 tablet 1  . amoxicillin (AMOXIL) 500 MG capsule Take 2,000 mg by mouth See admin instructions. Take 4 tablet before dental appointment.    Marland Kitchen lisinopril (ZESTRIL) 5 MG tablet Take 1 tablet (5 mg total) by mouth daily. (Patient not taking: Reported on 12/08/2018) 30 tablet 2   No facility-administered medications prior to visit.     PAST MEDICAL HISTORY: Past Medical History:  Diagnosis Date  . Anginal pain ([place]) 08/24/2010   Echo-EF =>55%,LV normal  . Arthritis   . Cataract 12/2006   Both eyes  Epes  MD  . Chest pain, unspecified 09/04/2007   Lexiscan-- EF 72%; LV normal  . Constipation    Takes flax seed and honey .  Smooth Move tea.  . Depression   . Diverticulitis   . Head injury, closed    with fall  . Hypertension   . Mitral valve prolapse   . Osteopenia   . Stroke [facility](HCC)     PAST SURGICAL HISTORY: Past Surgical History:  Procedure Laterality Date  . ANTERIOR CERVICAL DECOMP/DISCECTOMY FUSION N/A 10/23/2012   Procedure: Cervical Five to Cervical Six, Cervical Six to Cervical Seven anterior cervical decompression with fusion plating and bonegraft;  Surgeon: Hewitt Shortsobert W Nudelman,  MD;  Location: [facility];  Service: Neurosurgery;  Laterality: N/A;  ANTERIOR CERVICAL DECOMPRESSION/DISCECTOMY FUSION 2 LEVELS  . BREAST CYST ASPIRATION  1990   benign  . CARDIAC CATHETERIZATION  08/01/2010   normal coronaries  . COLONOSCOPY  approx 2011  . EYE SURGERY Bilateral 2011  . intestinal blockage surgery  Feb. 2012   "kink in small intestine" per pt    FAMILY HISTORY: Family History  Problem Relation Age of Onset  . Hypertension Mother   . Transient ischemic attack Mother   . Parkinson's disease Mother   . Dementia Mother   . CVA Mother   . Diabetes Father   . Coronary artery disease Father   . Heart failure Father   . Cancer Father        prostate  . Depression Father   . Congestive Heart Failure Father   . Prostate cancer Brother   . Diabetes Brother   . Prostate cancer Paternal Grandmother     SOCIAL HISTORY: Social History   Socioeconomic History  . Marital status: Widowed    Spouse name: Joe  . Number of children: 2  . Years of education: college  . Highest education level: Bachelor's degree (e.g., BA, AB, BS)  Occupational History    Comment: retired Runner, broadcasting/film/videoteacher  Social Needs  . Financial resource strain: Not hard at all  . Food insecurity    Worry: Never true    Inability: Never true  . Transportation needs    Medical: No    Non-medical: No  Tobacco Use  . Smoking status: Never Smoker  . Smokeless tobacco: Never Used  Substance and Sexual Activity  . Alcohol use: Yes    Alcohol/week: 4.0 standard drinks    Types: 2 Glasses of wine, 2 Cans of beer per week    Comment: occasionally  . Drug use: No  . Sexual activity: Yes  Lifestyle  . Physical activity    Days per week: 7 days    Minutes per session: 40 min  . Stress: To some extent  Relationships  . Social connections    Talks on phone: More than three times a week    Gets together: More than three times a week    Attends religious service: Never    Active member of club or  organization: No    Attends meetings of clubs or organizations: Never    Relationship status: Married  . Intimate partner violence    Fear of current or ex partner: No    Emotionally abused: No    Physically abused: No    Forced sexual activity: No  Other Topics Concern  . Not on file  Social History Narrative   Diet:  Normal   Do you drink/eat things with caffeine?   Yes   Marital status:  Widow  What year were you married? 1986 and 1960   Do you live in a house, apartment, assisted living, condo, trailer, etc)?  Apartment at [facility], independent living   Is it one or more stories? 1+   How many persons live in your home?  1   Do you have any pets in your home?  No   Current or past profession:  Runner, broadcasting/film/video, Environmental health practitioner at [employer] you exercise?   Yes                                                 Type & how often: Stretching,  Strengthening   Do you have a living will?  Yes   Do you have a DNR Form?   Do you have a POA/HPOA forms?  HPOA     PHYSICAL EXAM  GENERAL EXAM/CONSTITUTIONAL: Vitals:  Vitals:   12/08/18 1547  BP: (!) 163/90  Pulse: (!) 58  Temp: 97.8 F (36.6 C)  Weight: 139 lb (63 kg)  Height:  (1.575 m)     Body mass index is 25.42 kg/m. Wt Readings from Last 3 Encounters:  12/08/18 139 lb (63 kg)  12/04/18 137 lb 12.8 oz (62.5 kg)  12/02/18 141 lb 1.5 oz (64 kg)     Patient is in no distress; well developed, nourished and groomed; neck is supple  CARDIOVASCULAR:  Examination of carotid arteries is normal; no carotid bruits  Regular rate and rhythm, no murmurs  Examination of peripheral vascular system by observation and palpation is normal  EYES:  Ophthalmoscopic exam of optic discs and posterior segments is normal; no papilledema or hemorrhages  No exam data present  MUSCULOSKELETAL:  Gait, strength, tone, movements noted in Neurologic exam below  NEUROLOGIC: MENTAL STATUS:   MMSE - Mini Mental State Exam 09/03/2017  Orientation to time 5  Orientation to Place 5  Registration 3  Attention/ Calculation 5  Recall 2  Language- name 2 objects 2  Language- repeat 1  Language- follow 3 step command 3  Language- read & follow direction 1  Write a sentence 1  Copy design 1  Total score 29    awake, alert, oriented to person, place and time  recent and remote memory intact  normal attention and concentration  language fluent, comprehension intact, naming intact  fund of knowledge appropriate  CRANIAL NERVE:   2nd - no papilledema on fundoscopic exam  2nd, 3rd, 4th, 6th - pupils equal and reactive to light, visual fields full to confrontation, extraocular muscles intact, no nystagmus; EXCEPT DECR RIGHT VISION FIELD FROM RIGHT EYE  5th - facial sensation symmetric  7th - facial strength symmetric  8th - hearing intact  9th - palate elevates symmetrically, uvula midline  11th - shoulder shrug symmetric  12th - tongue protrusion midline  MOTOR:   normal bulk and tone, full strength in the BUE, BLE  SENSORY:   normal and symmetric to light touch, temperature, vibration  COORDINATION:   finger-nose-finger, fine finger movements normal  REFLEXES:   deep tendon reflexes TRACE and symmetric  GAIT/STATION:   narrow based gait; SCOLIOSIS AND KYPHOSIS; USES WALKER     DIAGNOSTIC DATA (LABS, IMAGING, TESTING) - I reviewed patient records, labs, notes, testing and imaging myself where available.  Lab Results  Component Value Date   WBC 7.5  12/02/2018   HGB 15.3 (H) 12/02/2018   HCT 45.0 12/02/2018   MCV 98.3 12/02/2018   PLT 273 12/02/2018      Component Value Date/Time   NA 139 12/02/2018 1829   NA 137 05/10/2017   K 4.2 12/02/2018 1829   CL 110 12/02/2018 1829   CL 101 05/10/2017   CO2 26 12/02/2018 1809   CO2 31 05/10/2017   GLUCOSE 98 12/02/2018 1829   BUN 17 12/02/2018 1829   BUN 16 05/10/2017   CREATININE 0.70  12/02/2018 1829   CREATININE 0.75 01/28/2018 0740   CALCIUM 10.3 12/02/2018 1809   CALCIUM 10.6 05/10/2017   PROT 7.8 12/02/2018 1809   ALBUMIN 4.5 12/02/2018 1809   AST 31 12/02/2018 1809   ALT 16 12/02/2018 1809   ALKPHOS 57 12/02/2018 1809   BILITOT 0.9 12/02/2018 1809   GFRNONAA >60 12/02/2018 1809   GFRNONAA 75 01/28/2018 0740   GFRAA >60 12/02/2018 1809   GFRAA 87 01/28/2018 0740   Lab Results  Component Value Date   CHOL 160 01/28/2018   HDL 51 01/28/2018   LDLCALC 93 01/28/2018   TRIG 72 01/28/2018   CHOLHDL 3.1 01/28/2018   Lab Results  Component Value Date   HGBA1C  08/01/2010    5.4 (NOTE)                                                                       According to the ADA Clinical Practice Recommendations for 2011, when HbA1c is used as a screening test:   >=6.5%   Diagnostic of Diabetes Mellitus           (if abnormal result  is confirmed)  5.7-6.4%   Increased risk of developing Diabetes Mellitus  References:Diagnosis and Classification of Diabetes Mellitus,Diabetes Care,2011,34(Suppl 1):S62-S69 and Standards of Medical Care in         Diabetes - 2011,Diabetes Care,2011,34  (Suppl 1):S11-S61.   No results found for: VITAMINB12 Lab Results  Component Value Date   TSH 4.19 07/10/2018   12/03/18 MRI brain / MRA head [I reviewed images myself and agree with interpretation. Mild atrophy and moderate chronic small vessel ischemic disease. -VRP]  1. Small subacute left occipital cortex infarct associated with severe narrowing at the left P3/P4 junction. 2. Generalized chronic small vessel ischemia that is similar to 2014.   ASSESSMENT AND PLAN  84 y.o. year old female here with left occipital ischemic infarction, possibly embolic.   Dx:  1. Occipital stroke (HCC)     PLAN:  LEFT OCCIPITAL ISCHEMIC INFARCTION - continue aspirin 81mg  daily  - start statin (atorvastatin 20mg ; repeat lipid panel with PCP; goal LDL < 70) - continue BP control - check TTE  and cardiac monitor (30 day) --> cardioembolic workup  Orders Placed This Encounter  Procedures  . Cardiac event monitor  . ECHOCARDIOGRAM COMPLETE   Return for pending if symptoms worsen or fail to improve, return to PCP.    , MD 12/08/2018, 3:58 PM Certified in Neurology, Neurophysiology and Neuroimaging  [facility] [address], [address] [place], [address] [place] [phone]

## 2018-12-10 ENCOUNTER — Telehealth: Payer: Self-pay | Admitting: Diagnostic Neuroimaging

## 2018-12-10 NOTE — Telephone Encounter (Signed)
Pt states when she was in the hospital on 09-29 she had an EKG, she wants to know if that is the same as the Echo Cardio Gram that Dr Leta Baptist wants ordered on her, please call

## 2018-12-10 NOTE — Telephone Encounter (Signed)
Called patient and advised her an EKG shows heart's rhythm and echocardiogram shows heart's function and structure. She verbalized understanding, stated she would have echocardiogram done when she gets call to schedule it. Patient verbalized understanding, appreciation.

## 2018-12-22 ENCOUNTER — Ambulatory Visit (HOSPITAL_COMMUNITY): Payer: Medicare Other | Attending: Cardiovascular Disease

## 2018-12-22 ENCOUNTER — Other Ambulatory Visit: Payer: Self-pay

## 2018-12-22 ENCOUNTER — Other Ambulatory Visit: Payer: Self-pay | Admitting: Nurse Practitioner

## 2018-12-22 DIAGNOSIS — I10 Essential (primary) hypertension: Secondary | ICD-10-CM | POA: Insufficient documentation

## 2018-12-22 DIAGNOSIS — I639 Cerebral infarction, unspecified: Secondary | ICD-10-CM | POA: Insufficient documentation

## 2018-12-22 DIAGNOSIS — I313 Pericardial effusion (noninflammatory): Secondary | ICD-10-CM | POA: Diagnosis not present

## 2018-12-22 DIAGNOSIS — M810 Age-related osteoporosis without current pathological fracture: Secondary | ICD-10-CM

## 2018-12-22 NOTE — Telephone Encounter (Signed)
High risk or very high risk warning populated when attempting to refill medication. RX request sent to PCP for review and approval if warranted.   

## 2018-12-23 ENCOUNTER — Telehealth: Payer: Self-pay | Admitting: *Deleted

## 2018-12-23 NOTE — Telephone Encounter (Signed)
[employer] to ship a 30 day cardiac event monitor to the patients home.  Instructions included in the monitor kit. 

## 2018-12-26 ENCOUNTER — Telehealth: Payer: Self-pay | Admitting: Diagnostic Neuroimaging

## 2018-12-26 NOTE — Telephone Encounter (Signed)
Pt is asking for a call from RN with the results to her Echo Cardiogram from 12-22-18 please call

## 2018-12-29 ENCOUNTER — Telehealth: Payer: Self-pay | Admitting: Diagnostic Neuroimaging

## 2018-12-29 NOTE — Telephone Encounter (Signed)
Pt called back stating that she is wanting a copy of her Echo Cardiogram mailed to her and to make sure that a copy is sent to her PCP. Please advise.

## 2018-12-29 NOTE — Telephone Encounter (Addendum)
Spoke with patient and informed her echocardiogram showed overall no source of stroke. Another abnormal noted: Mild pericardial effusion; previously diagnosed in 2012. He advised she follow up with PCP and cardiology. She stated she doesn't have a cardiologist. She asked when the 2012 study was done; she didn't recollect. I advised her it was in June. She verbalized concern she wasn't told about pericardial effusion. I advised her the ordering provider would have called her if it was of a concern to be followed. She  verbalized understanding, appreciation.

## 2018-12-30 ENCOUNTER — Other Ambulatory Visit: Payer: Self-pay | Admitting: Nurse Practitioner

## 2018-12-30 ENCOUNTER — Other Ambulatory Visit: Payer: Self-pay

## 2018-12-30 ENCOUNTER — Other Ambulatory Visit: Payer: Medicare Other

## 2018-12-30 DIAGNOSIS — M159 Polyosteoarthritis, unspecified: Secondary | ICD-10-CM

## 2018-12-30 DIAGNOSIS — I639 Cerebral infarction, unspecified: Secondary | ICD-10-CM

## 2018-12-30 LAB — COMPLETE METABOLIC PANEL WITH GFR
AG Ratio: 1.8 (calc) (ref 1.0–2.5)
ALT: 14 U/L (ref 6–29)
AST: 21 U/L (ref 10–35)
Albumin: 4.3 g/dL (ref 3.6–5.1)
Alkaline phosphatase (APISO): 46 U/L (ref 37–153)
BUN: 19 mg/dL (ref 7–25)
CO2: 29 mmol/L (ref 20–32)
Calcium: 9.9 mg/dL (ref 8.6–10.4)
Chloride: 106 mmol/L (ref 98–110)
Creat: 0.76 mg/dL (ref 0.60–0.88)
GFR, Est African American: 85 mL/min/{1.73_m2} (ref >=60)
GFR, Est Non African American: 73 mL/min/{1.73_m2} (ref >=60)
Globulin: 2.4 g/dL (calc) (ref 1.9–3.7)
Glucose, Bld: 93 mg/dL (ref 65–99)
Potassium: 4.3 mmol/L (ref 3.5–5.3)
Sodium: 142 mmol/L (ref 135–146)
Total Bilirubin: 0.5 mg/dL (ref 0.2–1.2)
Total Protein: 6.7 g/dL (ref 6.1–8.1)

## 2018-12-30 LAB — HEPATIC FUNCTION PANEL
AG Ratio: 1.8 (calc) (ref 1.0–2.5)
ALT: 14 U/L (ref 6–29)
AST: 21 U/L (ref 10–35)
Albumin: 4.3 g/dL (ref 3.6–5.1)
Alkaline phosphatase (APISO): 46 U/L (ref 37–153)
Bilirubin, Direct: 0.1 mg/dL (ref 0.0–0.2)
Globulin: 2.4 g/dL (calc) (ref 1.9–3.7)
Indirect Bilirubin: 0.4 mg/dL (calc) (ref 0.2–1.2)
Total Bilirubin: 0.5 mg/dL (ref 0.2–1.2)
Total Protein: 6.7 g/dL (ref 6.1–8.1)

## 2018-12-30 LAB — LIPID PANEL
Cholesterol: 116 mg/dL (ref <200)
HDL: 44 mg/dL — ABNORMAL LOW (ref >=50)
LDL Cholesterol (Calc): 57 mg/dL (calc)
Non-HDL Cholesterol (Calc): 72 mg/dL (calc) (ref <130)
Total CHOL/HDL Ratio: 2.6 (calc) (ref <5.0)
Triglycerides: 69 mg/dL (ref <150)

## 2018-12-31 ENCOUNTER — Ambulatory Visit (INDEPENDENT_AMBULATORY_CARE_PROVIDER_SITE_OTHER): Payer: Medicare Other

## 2018-12-31 DIAGNOSIS — I4811 Longstanding persistent atrial fibrillation: Secondary | ICD-10-CM

## 2018-12-31 DIAGNOSIS — I639 Cerebral infarction, unspecified: Secondary | ICD-10-CM

## 2019-01-02 ENCOUNTER — Other Ambulatory Visit: Payer: Self-pay

## 2019-01-02 ENCOUNTER — Encounter: Payer: Self-pay | Admitting: Internal Medicine

## 2019-01-02 ENCOUNTER — Non-Acute Institutional Stay: Payer: Medicare Other | Admitting: Internal Medicine

## 2019-01-02 VITALS — BP 122/70 | HR 99 | Temp 97.1°F | Ht 62.0 in | Wt 144.4 lb

## 2019-01-02 DIAGNOSIS — I639 Cerebral infarction, unspecified: Secondary | ICD-10-CM

## 2019-01-02 DIAGNOSIS — M810 Age-related osteoporosis without current pathological fracture: Secondary | ICD-10-CM

## 2019-01-02 DIAGNOSIS — I313 Pericardial effusion (noninflammatory): Secondary | ICD-10-CM | POA: Diagnosis not present

## 2019-01-02 DIAGNOSIS — E785 Hyperlipidemia, unspecified: Secondary | ICD-10-CM

## 2019-01-02 DIAGNOSIS — I10 Essential (primary) hypertension: Secondary | ICD-10-CM

## 2019-01-02 DIAGNOSIS — R2681 Unsteadiness on feet: Secondary | ICD-10-CM

## 2019-01-02 DIAGNOSIS — F320 Major depressive disorder, single episode, mild: Secondary | ICD-10-CM

## 2019-01-02 DIAGNOSIS — I3139 Other pericardial effusion (noninflammatory): Secondary | ICD-10-CM

## 2019-01-02 NOTE — Progress Notes (Signed)
[facility] [place]   Place of Service:  Clinic (12)  Provider:   Code Status:  Goals of Care:  Advanced Directives 08/07/2018  Does Patient Have a Medical Advance Directive? Yes  Type of Estate agentAdvance Directive Healthcare Power [place]  Does patient want to make changes to medical advance directive? No - Patient declined  Copy of Healthcare Power of Attorney in Chart? Yes - validated most recent copy scanned in chart (See row information)  Pre-existing out of facility DNR order (yellow form or pink MOST form) -     Chief Complaint  Patient presents with  . Medical Management of Chronic Issues    6 month follow up. Patient states this will be her first time seeing Dr. Chales AbrahamsGupta. She had a stroke 3-4 weeks ago. She does complain of some depression and mobility issues. She has to wear a heart monitor for 30 days.     HPI: Patient is a 84 y.o. female seen today for an Routine Visit Patient has h/o Hypertension, Hyperlipidemia, Osteoporosis,  She recently had Posterior Occipital Stroke. Acute Posterior CVA  Patient had Acute Stroke in 9/20. It was Small Subacute Left Occipital Cortex with Severe Narrowing in P3-4 She was seen by Neurology and they have her Holter Monitor for 30 days now.  She did have Cardiac Echo and it showed Moderate  Pericardial Effusion on Right Ventricular. Patient is Completely Asymptomatic. No Chest Pain, SOB or Swelling. No PND Due to her stroke patient did lose some vision in both her eyes.  But she states she is able to walk and do her ADLs without any issues.  She does have lower extremity weakness is worked with therapy before.  Has not had any falls recently.  Is able to walk with her walker.  Does have unsteady gait. Depression Patient has a history of depression has been on Wellbutrin before.  She says she does not want to try any new medications yet.  She had questions about [facility] Unsteady gait Does not seem to be related  to the stroke.  Patient states that she was like this before and has worked with therapy.  Continues to walk 25 to 30 minutes every day with her walker   Past Medical History:  Diagnosis Date  . Anginal pain (HCC) 08/24/2010   Echo-EF =>55%,LV normal  . Arthritis   . Cataract 12/2006   Both eyes  Epes MD  . Chest pain, unspecified 09/04/2007   Lexiscan-- EF 72%; LV normal  . Constipation    Takes flax seed and honey .  Smooth Move tea.  . Depression   . Diverticulitis   . Head injury, closed    with fall  . Hypertension   . Mitral valve prolapse   . Osteopenia   . Stroke [facility](HCC)     Past Surgical History:  Procedure Laterality Date  . ANTERIOR CERVICAL DECOMP/DISCECTOMY FUSION N/A 10/23/2012   Procedure: Cervical Five to Cervical Six, Cervical Six to Cervical Seven anterior cervical decompression with fusion plating and bonegraft;  Surgeon: Hewitt Shortsobert W Nudelman, MD;  Location: [facility];  Service: Neurosurgery;  Laterality: N/A;  ANTERIOR CERVICAL DECOMPRESSION/DISCECTOMY FUSION 2 LEVELS  . BREAST CYST ASPIRATION  1990   benign  . CARDIAC CATHETERIZATION  08/01/2010   normal coronaries  . COLONOSCOPY  approx 2011  . EYE SURGERY Bilateral 2011  . intestinal blockage surgery  Feb. 2012   "kink in small intestine" per pt    Allergies  Allergen Reactions  . Contrast Media [Iodinated Diagnostic Agents] Hives    Hives during IVP at urology office '02, requires 13 hr prep///a.calhoun  Patient came thru ER and had a 1 hour pre-medication and did fine  . Oxycodone Other (See Comments)    Decreased appetite.  Jaclyn Prime [Ibandronic Acid]     Nausea, vomiting, weakness  . Codeine Diarrhea and Nausea And Vomiting  . Darvocet [Propoxyphene N-Acetaminophen] Diarrhea and Nausea And Vomiting  . Erythromycin Nausea And Vomiting    Outpatient Encounter Medications as of 01/02/2019  Medication Sig  . alendronate (FOSAMAX) 70 MG tablet TAKE 1 TAB ONCE A WEEK, AT LEAST 30 MIN BEFORE 1ST  FOOD.DO NOT LIE DOWN FOR 30 MIN AFTER TAKING.  Marland Kitchen amoxicillin (AMOXIL) 500 MG capsule Take 2,000 mg by mouth See admin instructions. Take 4 tablet before dental appointment.  Marland Kitchen aspirin 81 MG chewable tablet Chew 1 tablet (81 mg total) by mouth daily.  Marland Kitchen atorvastatin (LIPITOR) 20 MG tablet Take 1 tablet (20 mg total) by mouth daily.  . Calcium Carb-Cholecalciferol (CALCIUM 600+D3 PO) Take 600 mg by mouth 2 (two) times a day.  . docusate sodium (STOOL SOFTENER) 100 MG capsule Take 100 mg by mouth at bedtime.  Marland Kitchen lisinopril (ZESTRIL) 5 MG tablet Take 1 tablet (5 mg total) by mouth daily.  . metoprolol tartrate (LOPRESSOR) 25 MG tablet TAKE 1 TABLET BY MOUTH TWICE DAILY. (Patient taking differently: Take 25 mg by mouth 2 (two) times daily. )  . [DISCONTINUED] acetaminophen (TYLENOL) 500 MG tablet Take 1,000 mg by mouth every 6 (six) hours as needed for mild pain. For neck pain    No facility-administered encounter medications on file as of 01/02/2019.     Review of Systems:  Review of Systems  Constitutional: Negative.   HENT: Negative.   Respiratory: Negative for chest tightness and shortness of breath.   Cardiovascular: Negative.   Genitourinary: Negative.   Musculoskeletal: Positive for back pain.  Skin: Negative.   Neurological: Positive for weakness.  Psychiatric/Behavioral: Positive for dysphoric mood and sleep disturbance.  All other systems reviewed and are negative.   Health Maintenance  Topic Date Due  . TETANUS/TDAP  07/28/2019  . INFLUENZA VACCINE  Completed  . DEXA SCAN  Completed  . PNA vac Low Risk Adult  Completed    Physical Exam: Vitals:   01/02/19 0915  Weight: 144 lb 6.4 oz (65.5 kg)  Height: 5\' 2"  (1.575 m)   Body mass index is 26.41 kg/m. Physical Exam Vitals signs reviewed.  Constitutional:      Appearance: Normal appearance.  HENT:     Head: Normocephalic.     Nose: Nose normal.     Mouth/Throat:     Mouth: Mucous membranes are moist.     Pharynx:  Oropharynx is clear.  Eyes:     Pupils: Pupils are equal, round, and reactive to light.  Neck:     Musculoskeletal: Neck supple.  Cardiovascular:     Rate and Rhythm: Normal rate and regular rhythm.     Pulses: Normal pulses.     Heart sounds: Normal heart sounds. No murmur.  Pulmonary:     Effort: Pulmonary effort is normal. No respiratory distress.     Breath sounds: Normal breath sounds. No wheezing or rales.  Abdominal:     General: Abdomen is flat. Bowel sounds are normal. There is no distension.     Palpations: Abdomen is soft.     Tenderness: There is no abdominal  tenderness. There is no guarding.  Musculoskeletal:        General: No swelling.  Skin:    General: Skin is warm and dry.  Neurological:     General: No focal deficit present.     Mental Status: She is alert and oriented to person, place, and time.     Comments: Slightly Unstable Gait. But walks with walker. Very Slow  Psychiatric:        Mood and Affect: Mood normal.        Thought Content: Thought content normal.     Labs reviewed: Basic Metabolic Panel: Recent Labs    01/28/18 0740 07/10/18 0710 12/02/18 1809 12/02/18 1829 12/30/18 1020  NA 142  --  140 139 142  K 4.1  --  3.8 4.2 4.3  CL 106  --  103 110 106  CO2 29  --  26  --  29  GLUCOSE 90  --  106* 98 93  BUN 20  --  13 17 19   CREATININE 0.75  --  0.84 0.70 0.76  CALCIUM 10.1  --  10.3  --  9.9  TSH  --  4.19  --   --   --    Liver Function Tests: Recent Labs    01/28/18 0740 12/02/18 1809 12/30/18 1020  AST 18 31 21  21   ALT 12 16 14  14   ALKPHOS  --  57  --   BILITOT 0.4 0.9 0.5  0.5  PROT 6.6 7.8 6.7  6.7  ALBUMIN  --  4.5  --    No results for input(s): LIPASE, AMYLASE in the last 8760 hours. No results for input(s): AMMONIA in the last 8760 hours. CBC: Recent Labs    01/28/18 0740 07/10/18 0710 12/02/18 1809 12/02/18 1829  WBC 5.8 5.8 7.5  --   NEUTROABS  --  3,248 5.0  --   HGB 13.5 14.2 14.4 15.3*  HCT 40.0  41.7 45.2 45.0  MCV 91.5 91.4 98.3  --   PLT 246 265 273  --    Lipid Panel: Recent Labs    01/28/18 0740 12/30/18 1020  CHOL 160 116  HDL 51 44*  LDLCALC 93 57  TRIG 72 69  CHOLHDL 3.1 2.6   Lab Results  Component Value Date   HGBA1C  08/01/2010    5.4 (NOTE)                                                                       According to the ADA Clinical Practice Recommendations for 2011, when HbA1c is used as a screening test:   >=6.5%   Diagnostic of Diabetes Mellitus           (if abnormal result  is confirmed)  5.7-6.4%   Increased risk of developing Diabetes Mellitus  References:Diagnosis and Classification of Diabetes Mellitus,Diabetes Care,2011,34(Suppl 1):S62-S69 and Standards of Medical Care in         Diabetes - 2011,Diabetes Care,2011,34  (Suppl 1):S11-S61.    Procedures since last visit: No results found.  Assessment/Plan Pericardial effusion Completely Asymptomatic Referal to Cardiology  Essential hypertension, benign Controlled on Metoprolol and Lisinopril  Cerebrovascular accident (CVA),  BP controlled On Aspirin On statin Follow up  with Neurology. Has Holter Monitor  Osteoporosis  On Fosamax Will review her DEXA in next visit Current mild episode of major depressive disorder,  Has been on Wellbutrin before Does not want to try anything new as she has had side effects with other Meds before  Hyperlipidemia LDL less then 100  Unsteady gait Has seen Therapy before No recent Falls Will continue to [place]  Labs reviewed Up to date on Vaccinations  Labs/tests ordered: Next appt:  Follow up in 6 weeks  Total time spent in this patient care encounter was  45_  minutes; greater than 50% of the visit spent counseling patient and staff, reviewing records , Labs and coordinating care for problems addressed at this encounter.

## 2019-01-05 ENCOUNTER — Other Ambulatory Visit: Payer: Self-pay | Admitting: Nurse Practitioner

## 2019-01-06 ENCOUNTER — Other Ambulatory Visit: Payer: Self-pay

## 2019-01-09 ENCOUNTER — Other Ambulatory Visit: Payer: Self-pay | Admitting: Nurse Practitioner

## 2019-01-09 DIAGNOSIS — I10 Essential (primary) hypertension: Secondary | ICD-10-CM

## 2019-01-14 ENCOUNTER — Telehealth: Payer: Self-pay

## 2019-01-14 DIAGNOSIS — I10 Essential (primary) hypertension: Secondary | ICD-10-CM

## 2019-01-14 MED ORDER — LISINOPRIL 5 MG PO TABS: 5.0000 mg | ORAL_TABLET | Freq: Every day | ORAL | 0 refills | Status: DC

## 2019-01-14 NOTE — Telephone Encounter (Signed)
[name], [name], NP  Oralia Manis, CMA        That's okay, thanks.   Previous Messages  ----- Message -----  From: Oralia Manis, CMA  Sent: 01/14/2019  4:20 PM EST  To: [name], NP   Hello [name] X I have a request for Adriana [name] 2034/10/08 to have her Lisinopril 5 mg changed from 30 to 90 tablets from [facility] Please Advise

## 2019-01-21 ENCOUNTER — Telehealth: Payer: Self-pay | Admitting: Cardiology

## 2019-01-21 NOTE — Telephone Encounter (Signed)
I tried reaching the patient.  Telephone rang, no answer 11/18

## 2019-01-21 NOTE — Telephone Encounter (Signed)
New Message:     Pt have an appointment with Dr Harrell Gave on 01-28-19. She says she need her daughter to come in with her for her appointment. She have problem remembering.

## 2019-01-22 NOTE — Telephone Encounter (Signed)
I agree with recommendations--office policy is to limit visitors, especially with rising Covid rates, but I am always more than happy to do a conference call to get additional history. Thank you.

## 2019-01-22 NOTE — Telephone Encounter (Signed)
Pt called to ask if her daughter can assist her to her appt with Dr. Harrell Gave on 01/28/19.Marland Kitchen she recently had a CVA 11/2018 but she can ambulate well with her cane... her daughter can assist her to the office and wait in her car with her cell and listen in on the visit. But, she says her daughter knows all of her recent medical history.. I advised her that we can still talk with her over the phone during the visit but will forward to Dr. Harrell Gave to be sure she is okay with that..   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 so she just wanted to be honest and let us know that her workplace can make her more susceptible to exposure but she has also recently tested negative.   Will forward to Dr. Donny Pique for review prior to her appt.

## 2019-01-22 NOTE — Telephone Encounter (Signed)
Pt advised and she will have her cell phone with her at her visit.

## 2019-01-28 ENCOUNTER — Encounter: Payer: Self-pay | Admitting: Cardiology

## 2019-01-28 ENCOUNTER — Ambulatory Visit (INDEPENDENT_AMBULATORY_CARE_PROVIDER_SITE_OTHER): Payer: Medicare Other | Admitting: Cardiology

## 2019-01-28 ENCOUNTER — Other Ambulatory Visit: Payer: Self-pay

## 2019-01-28 VITALS — BP 137/77 | HR 54 | Ht 62.0 in | Wt 138.4 lb

## 2019-01-28 DIAGNOSIS — Z7189 Other specified counseling: Secondary | ICD-10-CM

## 2019-01-28 DIAGNOSIS — I10 Essential (primary) hypertension: Secondary | ICD-10-CM | POA: Diagnosis not present

## 2019-01-28 DIAGNOSIS — Z8673 Personal history of transient ischemic attack (TIA), and cerebral infarction without residual deficits: Secondary | ICD-10-CM

## 2019-01-28 DIAGNOSIS — I313 Pericardial effusion (noninflammatory): Secondary | ICD-10-CM | POA: Diagnosis not present

## 2019-01-28 DIAGNOSIS — I3139 Other pericardial effusion (noninflammatory): Secondary | ICD-10-CM

## 2019-01-28 DIAGNOSIS — E7800 Pure hypercholesterolemia, unspecified: Secondary | ICD-10-CM

## 2019-01-28 NOTE — Patient Instructions (Signed)
Medication Instructions:  Your Physician recommend you continue on your current medication as directed.    *If you need a refill on your cardiac medications before your next appointment, please call your pharmacy*  Lab Work: None  Testing/Procedures: Your physician has requested that you have an echocardiogram. Echocardiography is a painless test that uses sound waves to create images of your heart. It provides your doctor with information about the size and shape of your heart and how well your heart's chambers and valves are working. This procedure takes approximately one hour. There are no restrictions for this procedure. [address]   Follow-Up: At [facility], you and your health needs are our priority.  As part of our continuing mission to provide you with exceptional heart care, we have created designated Provider Care Teams.  These Care Teams include your primary Cardiologist (physician) and Advanced Practice Providers (APPs -  Physician Assistants and Nurse Practitioners) who all work together to provide you with the care you need, when you need it.  Your next appointment:   6 month(s)  The format for your next appointment:   In Person  Provider:   Bridgette Christopher, MD   

## 2019-01-28 NOTE — Progress Notes (Signed)
Cardiology Office Note:    Date:  01/28/2019   ID:  Adriana [name], DOB 11/16/1934, MRN [id number]  PCP:  [name], [name], NP  Cardiologist:  Jodelle RedBridgette Sybol Morre, MD  Referring MD: Mahlon GammonGupta, Anjali L, MD   CC: new patient appointment for evaluation of pericardial effusion  History of Present Illness:    Adriana [name] is a 84 y.o. female with a hx of CVA 11/2018, hypertension, hyperlipidemia who is seen as a new consult at the request of Mahlon GammonGupta, Anjali L, MD for the evaluation and management of pericardial effusion on echo.  Today: Also discussed via speakerphone at patient's request with daughter Raynelle FanningJulie at [phone].  Wearing heart monitor currently, day 29/30. [facility], has gotten some help with her monitor from the nursing staff. Discussed that we are looking for afib as a possible cause of her stroke. She asked very good questions regarding this. No palpitations that she has noticed. No significant prior cardiac history except for distant report of mitral valve prolapse.  Also discussed pericardial effusion on her echo. Denies chest pain, shortness of breath at rest or with normal exertion. No PND, orthopnea, LE edema or unexpected weight gain. No syncope or palpitations. Discussed limited echo to follow this up.  Overall feels that she is gradually recovering from stroke. Vision has been the largest issues. She has gotten therapy at home and this has helped.   Past Medical History:  Diagnosis Date  . Anginal pain (HCC) 08/24/2010   Echo-EF =>55%,LV normal  . Arthritis   . Cataract 12/2006   Both eyes  Epes MD  . Chest pain, unspecified 09/04/2007   Lexiscan-- EF 72%; LV normal  . Constipation    Takes flax seed and honey .  Smooth Move tea.  . Depression   . Diverticulitis   . Head injury, closed    with fall  . Hypertension   . Mitral valve prolapse   . Osteopenia   . Stroke [facility](HCC)     Past Surgical History:  Procedure Laterality Date  .  ANTERIOR CERVICAL DECOMP/DISCECTOMY FUSION N/A 10/23/2012   Procedure: Cervical Five to Cervical Six, Cervical Six to Cervical Seven anterior cervical decompression with fusion plating and bonegraft;  Surgeon: Hewitt Shortsobert W Nudelman, MD;  Location: [facility];  Service: Neurosurgery;  Laterality: N/A;  ANTERIOR CERVICAL DECOMPRESSION/DISCECTOMY FUSION 2 LEVELS  . BREAST CYST ASPIRATION  1990   benign  . CARDIAC CATHETERIZATION  08/01/2010   normal coronaries  . COLONOSCOPY  approx 2011  . EYE SURGERY Bilateral 2011  . intestinal blockage surgery  Feb. 2012   "kink in small intestine" per pt    Current Medications: Current Outpatient Medications on File Prior to Visit  Medication Sig  . alendronate (FOSAMAX) 70 MG tablet TAKE 1 TAB ONCE A WEEK, AT LEAST 30 MIN BEFORE 1ST FOOD.DO NOT LIE DOWN FOR 30 MIN AFTER TAKING.  Marland Kitchen. amoxicillin (AMOXIL) 500 MG capsule Take 2,000 mg by mouth See admin instructions. Take 4 tablet before dental appointment.  Marland Kitchen. aspirin 81 MG chewable tablet Chew 1 tablet (81 mg total) by mouth daily.  Marland Kitchen. atorvastatin (LIPITOR) 20 MG tablet Take 1 tablet (20 mg total) by mouth daily.  . Calcium Carb-Cholecalciferol (CALCIUM 600+D3 PO) Take 600 mg by mouth 2 (two) times a day.  . docusate sodium (STOOL SOFTENER) 100 MG capsule Take 100 mg by mouth at bedtime.  Marland Kitchen. lisinopril (ZESTRIL) 5 MG tablet Take 1 tablet (5 mg total)  by mouth daily.  . metoprolol tartrate (LOPRESSOR) 25 MG tablet TAKE 1 TABLET BY MOUTH TWICE DAILY.   No current facility-administered medications on file prior to visit.      Allergies:   Contrast media [iodinated diagnostic agents], Oxycodone, Boniva [ibandronic acid], Codeine, Darvocet [propoxyphene n-acetaminophen], and Erythromycin   Social History   Tobacco Use  . Smoking status: Never Smoker  . Smokeless tobacco: Never Used  Substance Use Topics  . Alcohol use: Yes    Alcohol/week: 4.0 standard drinks    Types: 2 Glasses of wine, 2 Cans of beer per  week    Comment: occasionally  . Drug use: No    Family History: family history includes CVA in her mother; Cancer in her father; Congestive Heart Failure in her father; Coronary artery disease in her father; Dementia in her mother; Depression in her father; Diabetes in her brother and father; Heart failure in her father; Hypertension in her mother; Parkinson's disease in her mother; Prostate cancer in her brother and paternal grandmother; Transient ischemic attack in her mother.  ROS:   Please see the history of present illness.  Additional pertinent ROS: Constitutional: Negative for chills, fever, night sweats, unintentional weight loss  HENT: Negative for ear pain and hearing loss.   Eyes: Negative for loss of vision and eye pain.  Respiratory: Negative for cough, sputum, wheezing.   Cardiovascular: See HPI. Gastrointestinal: Negative for abdominal pain, melena, and hematochezia.  Genitourinary: Negative for dysuria and hematuria.  Musculoskeletal: Negative for falls and myalgias.  Skin: Negative for itching and rash.  Neurological: Negative for focal weakness, focal sensory changes and loss of consciousness.  Endo/Heme/Allergies: Does not bruise/bleed easily.     EKGs/Labs/Other Studies Reviewed:    The following studies were reviewed today: Echo 12/22/18 1. There is a small to moderate loculated pericardial effusion present over the anterior RV. There is no echocardiographic evidence of tamponade.  2. Left ventricular ejection fraction, by visual estimation, is 60 to 65%. The left ventricle has normal function. Normal left ventricular size. There is borderline left ventricular hypertrophy.  3. Left ventricular diastolic Doppler parameters are consistent with impaired relaxation pattern of LV diastolic filling.  4. Global right ventricle has normal systolic function.The right ventricular size is normal. No increase in right ventricular wall thickness.  5. Left atrial size was  normal.  6. Right atrial size was normal.  7. Moderate pericardial effusion.  8. The pericardial effusion is anterior to the right ventricle.  9. Mild thickening of the anterior and posterior mitral valve leaflet(s). 10. The mitral valve is myxomatous. No evidence of mitral valve regurgitation. 11. The tricuspid valve is normal in structure. Tricuspid valve regurgitation is trivial. 12. The aortic valve is tricuspid Aortic valve regurgitation was not visualized by color flow Doppler. Structurally normal aortic valve, with no evidence of sclerosis or stenosis. 13. There is Mild calcification of the aortic valve. 14. There is Mild thickening of the aortic valve. 15. The pulmonic valve was grossly normal. Pulmonic valve regurgitation is not visualized by color flow Doppler. 16. Mild plaque invoving the ascending aorta. 17. Normal pulmonary artery systolic pressure. 18. The tricuspid regurgitant velocity is 2.02 m/s, and with an assumed right atrial pressure of 3 mmHg, the estimated right ventricular systolic pressure is normal at 19.4 mmHg. 19. The inferior vena cava is normal in size with greater than 50% respiratory variability, suggesting right atrial pressure of 3 mmHg.  Heart cath 08/02/2010 HEMODYNAMIC RESULTS: 1. Aortic systolic pressure 409,  diastolic pressure 101. 2. Left ventricular systolic pressure 184, end-diastolic pressure 22. 3. RA pressure A-wave 4, V-wave 2, and mean 1. 4. RV pressure systolic 29, end-diastolic pressure 2. 5. PA pressure systolic 27, diastolic pressure 4, and mean 12. 6. Pulmonary capillary wedge pressure A-wave 4, V-wave 4, and mean 2.  SELECTIVE CORONARY ANGIOGRAPHY: 1. Left main normal. 2. LAD normal. 3. Left circumflex normal. 4. Right coronary artery was dominant and normal.  Ventriculography; RAO left ventriculogram was performed using 25 mL Visipaque dye at 12 mL per second.  The overall LVEF was estimated greater than 60% without focal wall  motion abnormalities.  Supravalvular aortography; the supravalvular aortogram was performed using 20 mL of Visipaque dye at 20 mL per second.  Aortic root was normal in caliber.  There was no dissection.  There is no AI.  Arch vessels were intact.  IMPRESSION:  Ms. Duffey has essentially normal coronary artery, normal LV function, and normal supravalvular aorta and low filling pressures suggesting she is dry.  She certainly is not in tamponade.  The etiology of her chest pain is not cardiac.  She may have pericarditis.  Continued medical therapy will be recommended.  Echo 08/01/2010: Normal EF, small to moderate pericardial effusion  EKG:  EKG is personally reviewed.  The ekg ordered today demonstrates sinus bradycardia  Recent Labs: 07/10/2018: TSH 4.19 12/02/2018: Hemoglobin 15.3; Platelets 273 12/30/2018: ALT 14; ALT 14; BUN 19; Creat 0.76; Potassium 4.3; Sodium 142  Recent Lipid Panel    Component Value Date/Time   CHOL 116 12/30/2018 1020   TRIG 69 12/30/2018 1020   HDL 44 (L) 12/30/2018 1020   CHOLHDL 2.6 12/30/2018 1020   VLDL 14 08/01/2010 0130   LDLCALC 57 12/30/2018 1020    Physical Exam:    VS:  BP 137/77   Pulse (!) 54   Ht 5\' 2"  (1.575 m)   Wt 138 lb 6.4 oz (62.8 kg)   SpO2 98%   BMI 25.31 kg/m     Wt Readings from Last 3 Encounters:  01/28/19 138 lb 6.4 oz (62.8 kg)  01/02/19 144 lb 6.4 oz (65.5 kg)  12/08/18 139 lb (63 kg)    GEN: Frail appearing, kyphotic spine, pleasant, in no acute distress HEENT: Normal, moist mucous membranes NECK: No JVD CARDIAC: regular rhythm, normal S1 and S2, no rubs or gallops. No murmurs. Monitor in place over sternum. VASCULAR: Radial and DP pulses 2+ bilaterally. No carotid bruits RESPIRATORY:  Clear to auscultation without rales, wheezing or rhonchi  ABDOMEN: Soft, non-tender, non-distended MUSCULOSKELETAL:  Ambulates independently SKIN: Warm and dry, no edema NEUROLOGIC:  Alert and oriented x 3. No focal neuro  deficits noted. PSYCHIATRIC:  Normal affect    ASSESSMENT:    1. Pericardial effusion   2. History of CVA (cerebrovascular accident)   3. Essential hypertension   4. Pure hypercholesterolemia   5. Cardiac risk counseling   6. Counseling on health promotion and disease prevention    PLAN:    Pericardial effusion: noted on echo in 2012, cath hemodynamics not suggestive of tamponade -would do limited repeat in several weeks. If stable, no further treatment  History of CVA: monitor in place to evaluate for afib -recovering well with therapy -on aspirin and statin  Hypertension: reasonable control given age, no changes  Hyperlipidemia: LDL goal <70. Currently 57 -continue atorvastatin  Cardiac risk counseling and prevention recommendations: -recommend heart healthy/Mediterranean diet, with whole grains, fruits, vegetable, fish, lean meats, nuts, and olive oil. Limit  salt. -recommend moderate walking, 3-5 times/week for 30-50 minutes each session. Aim for at least 150 minutes.week. Goal should be pace of 3 miles/hours, or walking 1.5 miles in 30 minutes -recommend avoidance of tobacco products. Avoid excess alcohol.  Plan for follow up: 6 mos or sooner PRN  Medication Adjustments/Labs and Tests Ordered: Current medicines are reviewed at length with the patient today.  Concerns regarding medicines are outlined above.  Orders Placed This Encounter  Procedures  . EKG 12-Lead  . ECHOCARDIOGRAM LIMITED   No orders of the defined types were placed in this encounter.   Patient Instructions  Medication Instructions:  Your Physician recommend you continue on your current medication as directed.    *If you need a refill on your cardiac medications before your next appointment, please call your pharmacy*  Lab Work: None  Testing/Procedures: Your physician has requested that you have an echocardiogram. Echocardiography is a painless test that uses sound waves to create images of  your heart. It provides your doctor with information about the size and shape of your heart and how well your heart's chambers and valves are working. This procedure takes approximately one hour. There are no restrictions for this procedure. [address]   Follow-Up: At [facility], you and your health needs are our priority.  As part of our continuing mission to provide you with exceptional heart care, we have created designated Provider Care Teams.  These Care Teams include your primary Cardiologist (physician) and Advanced Practice Providers (APPs -  Physician Assistants and Nurse Practitioners) who all work together to provide you with the care you need, when you need it.  Your next appointment:   6 month(s)  The format for your next appointment:   In Person  Provider:   [name], MD    Signed, [name], MD PhD 01/28/2019 3:25 PM    [facility]

## 2019-02-11 ENCOUNTER — Other Ambulatory Visit: Payer: Self-pay

## 2019-02-11 ENCOUNTER — Ambulatory Visit (HOSPITAL_COMMUNITY): Payer: Medicare Other | Attending: Internal Medicine

## 2019-02-11 DIAGNOSIS — I3139 Other pericardial effusion (noninflammatory): Secondary | ICD-10-CM

## 2019-02-11 DIAGNOSIS — I313 Pericardial effusion (noninflammatory): Secondary | ICD-10-CM

## 2019-02-15 ENCOUNTER — Encounter: Payer: Self-pay | Admitting: Nurse Practitioner

## 2019-02-15 DIAGNOSIS — I5030 Unspecified diastolic (congestive) heart failure: Secondary | ICD-10-CM | POA: Insufficient documentation

## 2019-02-19 ENCOUNTER — Telehealth: Payer: Self-pay | Admitting: Cardiology

## 2019-02-19 ENCOUNTER — Telehealth: Payer: Self-pay | Admitting: Diagnostic Neuroimaging

## 2019-02-19 NOTE — Telephone Encounter (Signed)
Pls contact cardiology (Dr. Rayann Heman office) to get final report. Prelim seems to be scanned in CV procedure tab. -VRP

## 2019-02-19 NOTE — Telephone Encounter (Signed)
Called Dr Jackalyn Lombard office, spoke with front staff and requested final result of cardiac monitor. She will send message to nurse.

## 2019-02-19 NOTE — Telephone Encounter (Signed)
Adriana [name] from [employer] calling from Dr.Penamolli's office to request the final result report for the patient's monitor.

## 2019-02-19 NOTE — Telephone Encounter (Signed)
Pt called wanting to know if her heart monitor results are in. Please advise.

## 2019-02-19 NOTE — Telephone Encounter (Signed)
Called patient and LVM informing her that the heart monitor report available is preliminary. I called cardiologist and requested final report be faxed to Dr Leta Baptist. I advised we've not received it yet. Gave her office hours and #.

## 2019-02-20 ENCOUNTER — Encounter: Payer: Self-pay | Admitting: Internal Medicine

## 2019-02-20 ENCOUNTER — Other Ambulatory Visit: Payer: Self-pay

## 2019-02-20 ENCOUNTER — Non-Acute Institutional Stay: Payer: Medicare Other | Admitting: Internal Medicine

## 2019-02-20 VITALS — BP 126/80 | HR 59 | Temp 96.6°F | Ht 62.0 in | Wt 140.6 lb

## 2019-02-20 DIAGNOSIS — I313 Pericardial effusion (noninflammatory): Secondary | ICD-10-CM | POA: Diagnosis not present

## 2019-02-20 DIAGNOSIS — I639 Cerebral infarction, unspecified: Secondary | ICD-10-CM

## 2019-02-20 DIAGNOSIS — M810 Age-related osteoporosis without current pathological fracture: Secondary | ICD-10-CM

## 2019-02-20 DIAGNOSIS — I3139 Other pericardial effusion (noninflammatory): Secondary | ICD-10-CM

## 2019-02-20 DIAGNOSIS — E785 Hyperlipidemia, unspecified: Secondary | ICD-10-CM

## 2019-02-20 DIAGNOSIS — I10 Essential (primary) hypertension: Secondary | ICD-10-CM

## 2019-02-20 NOTE — Progress Notes (Signed)
Location: [place] of Service:  Clinic (12)  Provider:   Code Status:  Goals of Care:  Advanced Directives 08/07/2018  Does Patient Have a Medical Advance Directive? Yes  Type of Paramedic of [place];Living will  Does patient want to make changes to medical advance directive? No - Patient declined  Copy of [place] in Chart? Yes - validated most recent copy scanned in chart (See row information)  Pre-existing out of facility DNR order (yellow form or pink MOST form) -     Chief Complaint  Patient presents with  . Medical Management of Chronic Issues    2 month follow up    HPI: Patient is a 84 y.o. female seen today for an acute visit for Follow up  Acute posterior CVA Patient had Acute Stroke in 9/20. It was Small Subacute Left Occipital Cortex with Severe Narrowing in P3-4 Neurology recommended Holter monitor Patient is still awaiting the results of the Moderate pericardial effusion Was found on her echo Patient continues to be completely asymptomatic Was seen by cardiology and they repeated the echo Since the fluid was stable nothing to be done unless patient becomes symptomatic Depression Patient states she has tried number of medications before.  She gets GI side effects.  She does not want to try anything right now Unsteady gait Continues to be an issue since the stroke.  Continues to have some proximal muscle weakness and tunnel vision Patient has not had any falls.  She states that she is doing 25 to 30 minutes of walking every day with her walker   Past Medical History:  Diagnosis Date  . Anginal pain ([place]) 08/24/2010   Echo-EF =>55%,LV normal  . Arthritis   . Cataract 12/2006   Both eyes  Epes MD  . Chest pain, unspecified 09/04/2007   Lexiscan-- EF 72%; LV normal  . Constipation    Takes flax seed and honey .  Smooth Move tea.  . Depression   . Diverticulitis   . Head injury, closed      with fall  . Hypertension   . Mitral valve prolapse   . Osteopenia   . Stroke [facility])     Past Surgical History:  Procedure Laterality Date  . ANTERIOR CERVICAL DECOMP/DISCECTOMY FUSION N/A 10/23/2012   Procedure: Cervical Five to Cervical Six, Cervical Six to Cervical Seven anterior cervical decompression with fusion plating and bonegraft;  Surgeon: Hosie Spangle, MD;  Location: [facility];  Service: Neurosurgery;  Laterality: N/A;  ANTERIOR CERVICAL DECOMPRESSION/DISCECTOMY FUSION 2 LEVELS  . BREAST CYST ASPIRATION  1990   benign  . CARDIAC CATHETERIZATION  08/01/2010   normal coronaries  . COLONOSCOPY  approx 2011  . EYE SURGERY Bilateral 2011  . intestinal blockage surgery  Feb. 2012   "kink in small intestine" per pt    Allergies  Allergen Reactions  . Contrast Media [Iodinated Diagnostic Agents] Hives    Hives during IVP at urology office '02, requires 13 hr prep///a.[email or web address]  Patient came thru ER and had a 1 hour pre-medication and did fine  . Oxycodone Other (See Comments)    Decreased appetite.  Jaclyn Prime [Ibandronic Acid]     Nausea, vomiting, weakness  . Codeine Diarrhea and Nausea And Vomiting  . Darvocet [Propoxyphene N-Acetaminophen] Diarrhea and Nausea And Vomiting  . Erythromycin Nausea And Vomiting    Outpatient Encounter Medications as of 02/20/2019  Medication Sig  . alendronate (FOSAMAX) 70  MG tablet TAKE 1 TAB ONCE A WEEK, AT LEAST 30 MIN BEFORE 1ST FOOD.DO NOT LIE DOWN FOR 30 MIN AFTER TAKING.  Marland Kitchen amoxicillin (AMOXIL) 500 MG capsule Take 2,000 mg by mouth See admin instructions. Take 4 tablet before dental appointment.  Marland Kitchen aspirin 81 MG chewable tablet Chew 1 tablet (81 mg total) by mouth daily.  Marland Kitchen atorvastatin (LIPITOR) 20 MG tablet Take 1 tablet (20 mg total) by mouth daily.  . Calcium Carb-Cholecalciferol (CALCIUM 600+D3 PO) Take 600 mg by mouth daily.   Marland Kitchen docusate sodium (STOOL SOFTENER) 100 MG capsule Take 100 mg by mouth at bedtime.  Marland Kitchen  lisinopril (ZESTRIL) 5 MG tablet Take 1 tablet (5 mg total) by mouth daily.  . metoprolol tartrate (LOPRESSOR) 25 MG tablet TAKE 1 TABLET BY MOUTH TWICE DAILY.  . NON FORMULARY Hemp lemon salve  Rub on back for pain   No facility-administered encounter medications on file as of 02/20/2019.    Review of Systems:  Review of Systems  Constitutional: Positive for activity change.  HENT: Negative.   Respiratory: Negative.   Cardiovascular: Negative.   Gastrointestinal: Negative.   Genitourinary: Negative.   Musculoskeletal: Positive for gait problem.  Neurological: Positive for dizziness and weakness.  Psychiatric/Behavioral: Positive for dysphoric mood.    Health Maintenance  Topic Date Due  . TETANUS/TDAP  07/28/2019  . INFLUENZA VACCINE  Completed  . DEXA SCAN  Completed  . PNA vac Low Risk Adult  Completed    Physical Exam: Vitals:   02/20/19 1024  BP: 126/80 Checked for Orthostatic and was negative at  Pulse: (!) 59  Temp: (!) 96.6 F (35.9 C)  SpO2: 95%  Weight: 140 lb 9.6 oz (63.8 kg)  Height:  (1.575 m)   Body mass index is 25.72 kg/m. Physical Exam  Constitutional: Oriented to person, place, and time. Well-developed and well-nourished.  HENT:  Head: Normocephalic.  Mouth/Throat: Oropharynx is clear and moist.  Eyes: Pupils are equal, round, and reactive to light.  Neck: Neck supple.  Cardiovascular: Normal rate and normal heart sounds.  No murmur heard. Pulmonary/Chest: Effort normal and breath sounds normal. No respiratory distress. No wheezes. She has no rales.  Abdominal: Soft. Bowel sounds are normal. No distension. There is no tenderness. There is no rebound.  Musculoskeletal: No edema.  Lymphadenopathy: none Neurological: Alert and oriented to person, place, and time.  Was slow getting up from the Exam table And was unsteady without her Walker Skin: Skin is warm and dry.  Psychiatric: Normal mood and affect. Behavior is normal. Thought  content normal.    Labs reviewed: Basic Metabolic Panel: Recent Labs    07/10/18 0710 12/02/18 1809 12/02/18 1829 12/30/18 1020  NA  --  140 139 142  K  --  3.8 4.2 4.3  CL  --  103 110 106  CO2  --  26  --  29  GLUCOSE  --  106* 98 93  BUN  --  CREATININE  --  0.84 0.70 0.76  CALCIUM  --  10.3  --  9.9  TSH 4.19  --   --   --    Liver Function Tests: Recent Labs    12/02/18 1809 12/30/18 1020  AST ALT ALKPHOS 57  --   BILITOT 0.9 0.5  0.5  PROT 7.8 6.7  6.7  ALBUMIN 4.5  --    No results for input(s): LIPASE,  AMYLASE in the last 8760 hours. No results for input(s): AMMONIA in the last 8760 hours. CBC: Recent Labs    07/10/18 0710 12/02/18 1809 12/02/18 1829  WBC 5.8 7.5  --   NEUTROABS 3,248 5.0  --   HGB 14.2 14.4 15.3*  HCT 41.7 45.2 45.0  MCV 91.4 98.3  --   PLT 265 273  --    Lipid Panel: Recent Labs    12/30/18 1020  CHOL 116  HDL 44*  LDLCALC 57  TRIG 69  CHOLHDL 2.6   Lab Results  Component Value Date   HGBA1C  08/01/2010    5.4 (NOTE)                                                                       According to the ADA Clinical Practice Recommendations for 2011, when HbA1c is used as a screening test:   >=6.5%   Diagnostic of Diabetes Mellitus           (if abnormal result  is confirmed)  5.7-6.4%   Increased risk of developing Diabetes Mellitus  References:Diagnosis and Classification of Diabetes Mellitus,Diabetes Care,2011,34(Suppl 1):S62-S69 and Standards of Medical Care in         Diabetes - 2011,Diabetes Care,2011,34  (Suppl 1):S11-S61.    Procedures since last visit: ECHOCARDIOGRAM LIMITED  Result Date: 02/11/2019   ECHOCARDIOGRAM LIMITED REPORT   Patient Name:   Adriana [name]   Date of Exam: 02/11/2019 Medical Rec #:  [id number]     Height:       62.0 in Accession #:    [id number]    Weight:       138.4 lb Date of Birth:  1935-02-21     BSA:          1.63 m Patient Age:    84 years      BP:            137/77 mmHg Patient Gender: F             HR:           57 bpm. Exam Location:  [address]  Procedure: Limited Echo, Cardiac Doppler and Limited Color Doppler Indications:    I31.3 Pericardial effusion (noninflammatory)  History:        Patient has prior history of Echocardiogram examinations, most                 recent 12/22/2018.  Sonographer:    Cathie Beams RCS Referring Phys: [phone] BRIDGETTE CHRISTOPHER IMPRESSIONS  1. Left ventricular ejection fraction, by visual estimation, is 60 to 65%. The left ventricle has normal function. There is no left ventricular hypertrophy.  2. Elevated left ventricular end-diastolic pressure.  3. Left ventricular diastolic parameters are consistent with Grade I diastolic dysfunction (impaired relaxation).  4. Global right ventricle has normal systolic function.The right ventricular size is normal. No increase in right ventricular wall thickness.  5. Left atrial size was normal.  6. Right atrial size was normal.  7. Small to moderate pericardial effusion with no significant change compared to the prior exam. No evidence of tamponade physiology. Mild right atrial collapse stable as compared to previous. No septal shudder, no respiratory related septal shift. No respiratory  variation in mitral inflow. Hepatic veins poorly visualized for Doppler assessment.  8. The pericardial effusion is primarily anterior to the right ventricle. Small effusion posterior to the left ventricle.  9. The mitral valve is normal in structure. Trivial mitral valve regurgitation. No evidence of mitral stenosis. 10. The tricuspid valve is normal in structure. Tricuspid valve regurgitation is trivial. 11. The aortic valve is tricuspid. Aortic valve regurgitation is not visualized. No evidence of aortic valve sclerosis or stenosis. 12. The pulmonic valve was normal in structure. Pulmonic valve regurgitation is trivial. 13. The left ventricle has no regional wall motion abnormalities. FINDINGS   Left Ventricle: Left ventricular ejection fraction, by visual estimation, is 60 to 65%. The left ventricle has normal function. The left ventricle has no regional wall motion abnormalities. There is no left ventricular hypertrophy. Left ventricular diastolic parameters are consistent with Grade I diastolic dysfunction (impaired relaxation). Elevated left ventricular end-diastolic pressure. Right Ventricle: The right ventricular size is normal. No increase in right ventricular wall thickness. Global RV systolic function is has normal systolic function. Left Atrium: Left atrial size was normal in size. Right Atrium: Right atrial size was normal in size Pericardium: A small pericardial effusion is present. The pericardial effusion is anterior to the right ventricle and posterior to the left ventricle. Mitral Valve: The mitral valve is normal in structure. No evidence of mitral valve stenosis by observation. MV Area by PHT, 3.85 cm. MV PHT, 57.13 msec. Trivial mitral valve regurgitation. Tricuspid Valve: The tricuspid valve is normal in structure. Tricuspid valve regurgitation is trivial. Aortic Valve: The aortic valve is tricuspid. Aortic valve regurgitation is not visualized. The aortic valve is structurally normal, with no evidence of sclerosis or stenosis. Pulmonic Valve: The pulmonic valve was normal in structure. Pulmonic valve regurgitation is trivial. Aorta: The aortic root is normal in size and structure. Venous: The inferior vena cava was not well visualized. Shunts: There is no evidence of a patent foramen ovale. No ventricular septal defect is seen or detected. There is no evidence of an atrial septal defect. No atrial level shunt detected by color flow Doppler.  LEFT VENTRICLE          Normals PLAX 2D LVIDd:         3.30 cm  3.6 cm   Diastology                 Normals LVIDs:         2.20 cm  1.7 cm   LV e' lateral:   6.08 cm/s 6.42 cm/s LV PW:         1.00 cm  1.4 cm   LV E/e' lateral: 13.4      15.4 LV  IVS:        1.60 cm  1.3 cm   LV e' medial:    2.76 cm/s 6.96 cm/s LVOT diam:     1.70 cm  2.0 cm   LV E/e' medial:  29.5      6.96 LV SV:         28 ml    79 ml LV SV Index:   16.72    45 ml/m2 LVOT Area:     2.27 cm 3.14 cm2  RIGHT VENTRICLE RV Basal diam:  2.20 cm RV S prime:     12.70 cm/s TAPSE (M-mode): 1.9 cm LEFT ATRIUM             Index       RIGHT ATRIUM  Index LA diam:        3.90 cm 2.39 cm/m  RA Area:     12.40 cm LA Vol (A2C):   43.8 ml 26.79 ml/m RA Volume:   27.10 ml  16.58 ml/m LA Vol (A4C):   48.5 ml 29.67 ml/m LA Biplane Vol: 46.7 ml 28.57 ml/m  AORTIC VALVE             Normals LVOT Vmax:   116.00 cm/s LVOT Vmean:  61.900 cm/s 75 cm/s LVOT VTI:    0.219 m     25.3 cm  AORTA                 Normals Ao Root diam: 2.70 cm 31 mm MITRAL VALVE              Normals MV Area (PHT): 3.85 cm             SHUNTS MV PHT:        57.13 msec 55 ms     Systemic VTI:  0.22 m MV Decel Time: 197 msec   187 ms    Systemic Diam: 1.70 cm MV E velocity: 81.40 cm/s 103 cm/s MV A velocity: 93.40 cm/s 70.3 cm/s MV E/A ratio:  0.87       1.5  Weston BrassGayatri Acharya MD Electronically signed by Weston BrassGayatri Acharya MD Signature Date/Time: 02/11/2019/10:01:15 PM    Final     Assessment/Plan  Pericardial effusion Per Cardiology as she is asymptomatic they have not not recommended at any intervention.   Her repeat echo did not show any change in effusion   At essential hypertension, benign Controlled on metoprolol and lisinopril She was negative for any Postural Hypotension  Cerebrovascular accident (CVA), PCA area with Vision effects BP controlled On statin and Aspirin Awaiting for Holter Monitor She will get appointment with her ophthalmologist Osteoporosis  On Fosamax Will review her DEXA in next visit Last DeXA in 07/2017 Tscore -2.2 Current mild episode of major depressive disorder,  Has been on Wellbutrin before Does not want to try anything new as she has had side effects with other Meds  before  Hyperlipidemia LDL less then 100  Unsteady gait Does have some Proximal Muscle weakness Does not want to do Therapy right now D/W her About walking carefully with the the walker  Labs reviewed Up to date on Vaccinations  Labs/tests ordered:  * No order type specified * Next appt:  Visit date not found Total time spent in this patient care encounter was  45_  minutes; greater than 50% of the visit spent counseling patient and staff, reviewing records , Labs and coordinating care for problems addressed at this encounter.

## 2019-02-20 NOTE — Addendum Note (Signed)
Addended by: Georgina Snell on: 02/20/2019 01:01 PM   Modules accepted: Orders

## 2019-02-24 NOTE — Telephone Encounter (Addendum)
Called Dr Jackalyn Lombard office, spoke with Altha Harm who stated she will send another message to the nurse.

## 2019-02-24 NOTE — Telephone Encounter (Signed)
Received call back from [facility] who stated she will have Dr Harrell Gave read heart monitor and fax result to Dr Leta Baptist.

## 2019-02-24 NOTE — Telephone Encounter (Signed)
Follow up   Per Stanton Kidney is calling to following up per the previous message to get the monitor report. Please advise.

## 2019-02-24 NOTE — Telephone Encounter (Signed)
Spoke with Stanton Kidney from [facility] who is requesting copy of monitor report. Informed Stanton Kidney that report has been uploaded to chart and awaiting MD's interpretation. Will fax report to [phone] once MD review.

## 2019-02-24 NOTE — Telephone Encounter (Signed)
FORWARD TO  DR Harrell Gave AND RN

## 2019-02-25 ENCOUNTER — Encounter: Payer: Self-pay | Admitting: Internal Medicine

## 2019-02-25 NOTE — Telephone Encounter (Signed)
This is set to be read by Dr. Rayann Heman but final results not available yet. Will be able to send once read. Thanks.

## 2019-03-03 NOTE — Telephone Encounter (Addendum)
I called patient to advise we haven't received heart monitor report from cardiologist. I advised her I called last week to get report and was told Dr Harrell Gave would read and a final report will be faxed to Dr Leta Baptist. She stated she will call cardiologist, her monitor completed on Nov 26th. I advised she'll get a call from [place] when we receive results. Patient verbalized understanding, appreciation.

## 2019-03-04 ENCOUNTER — Encounter: Payer: Self-pay | Admitting: Internal Medicine

## 2019-03-17 NOTE — Telephone Encounter (Signed)
Per note from Dr Chales Abrahams, heart monitor result is normal. They will discuss at her upcoming appointment in March. The patient was notified by Dr Urban Gibson office of normal result. Marland Kitchen

## 2019-04-03 ENCOUNTER — Other Ambulatory Visit: Payer: Self-pay | Admitting: Nurse Practitioner

## 2019-04-16 ENCOUNTER — Encounter: Payer: Self-pay | Admitting: Nurse Practitioner

## 2019-04-16 ENCOUNTER — Non-Acute Institutional Stay: Payer: Medicare PPO | Admitting: Nurse Practitioner

## 2019-04-16 ENCOUNTER — Other Ambulatory Visit: Payer: Self-pay

## 2019-04-16 DIAGNOSIS — M810 Age-related osteoporosis without current pathological fracture: Secondary | ICD-10-CM

## 2019-04-16 DIAGNOSIS — F418 Other specified anxiety disorders: Secondary | ICD-10-CM | POA: Diagnosis not present

## 2019-04-16 DIAGNOSIS — I639 Cerebral infarction, unspecified: Secondary | ICD-10-CM

## 2019-04-16 DIAGNOSIS — I10 Essential (primary) hypertension: Secondary | ICD-10-CM | POA: Diagnosis not present

## 2019-04-16 MED ORDER — SERTRALINE HCL 25 MG PO TABS: 25.0000 mg | ORAL_TABLET | Freq: Every day | ORAL | 2 refills | Status: DC

## 2019-04-16 NOTE — Patient Instructions (Addendum)
CBC/diff, CMP/eGFR, Lipid panel prior to the next appointment. Sertraline 80m by mouth daily  is prescribed for anxiety

## 2019-04-16 NOTE — Assessment & Plan Note (Signed)
Continue Alendronate.

## 2019-04-16 NOTE — Assessment & Plan Note (Signed)
Stable, 12/08/19 neurology, left occipital ischemic stroke. Continue ASA, Atorvastatin.

## 2019-04-16 NOTE — Progress Notes (Signed)
Location:   [facility]   Place of Service:  Clinic (12) Provider: Marlana Latus NP  Code Status: DNR Goals of Care: [place] 08/07/2018  Does Patient Have a Medical Advance Directive? Yes  Type of Paramedic of [place];Living will  Does patient want to make changes to medical advance directive? No - Patient declined  Copy of [place] in Chart? Yes - validated most recent copy scanned in chart (See row information)  Pre-existing out of facility DNR order (yellow form or pink MOST form) -     Chief Complaint  Patient presents with  . Medical Management of Chronic Issues    Patient would like a medication for axiety and depression. She complains of some unsteadiness feeeling.     HPI: Patient is a 85 y.o. female seen today for medical management of chronic diseases.    Hx of HTN, blood pressure is controlled on Lisinopril 51m qd, Metoprolol 250mbid. CVA, stable, feels lack of energy/motivation,  on ASA, Atorvastatin. Constipation, stale, on Colace daily. OA, on Alendronate. Stated she feels anxious at times, not sleep well, feels exhausted in morning, Hx of depression, not taking meds.     Past Medical History:  Diagnosis Date  . [place]   Echo-EF =>55%,LV normal  . Arthritis   . Cataract 12/2006   Both eyes  Epes MD  . Chest pain, unspecified 09/04/2007   Lexiscan-- EF 72%; LV normal  . Constipation    Takes flax seed and honey .  Smooth Move tea.  . Depression   . Diverticulitis   . Head injury, closed    with fall  . Hypertension   . Mitral valve prolapse   . Osteopenia   [facility]    Past Surgical History:  Procedure Laterality Date  . ANTERIOR CERVICAL DECOMP/DISCECTOMY FUSION N/A 10/23/2012   Procedure: Cervical Five to Cervical Six, Cervical Six to Cervical Seven anterior cervical decompression with fusion plating and bonegraft;  Surgeon: RoHosie SpangleMD;  [facility];  Service: Neurosurgery;  Laterality: N/A;  ANTERIOR CERVICAL DECOMPRESSION/DISCECTOMY FUSION 2 LEVELS  . BREAST CYST ASPIRATION  1990   benign  . CARDIAC CATHETERIZATION  08/01/2010   normal coronaries  . COLONOSCOPY  approx 2011  . EYE SURGERY Bilateral 2011  . intestinal blockage surgery  Feb. 2012   "kink in small intestine" per pt    Allergies  Allergen Reactions  . Contrast Media [Iodinated Diagnostic Agents] Hives    Hives during IVP at urology office '02, requires 13 hr prep///a.calhoun  Patient came thru ER and had a 1 hour pre-medication and did fine  . Oxycodone Other (See Comments)    Decreased appetite.  . Jaclyn PrimeIbandronic Acid]     Nausea, vomiting, weakness  . Codeine Diarrhea and Nausea And Vomiting  . Darvocet [Propoxyphene N-Acetaminophen] Diarrhea and Nausea And Vomiting  . Erythromycin Nausea And Vomiting    Allergies as of 04/16/2019      Reactions   Contrast Media [iodinated Diagnostic Agents] Hives   Hives during IVP at urology office '02, requires 13 hr prep///a.calhoun Patient came thru ER and had a 1 hour pre-medication and did fine   Oxycodone Other (See Comments)   Decreased appetite.   Boniva [ibandronic Acid]    Nausea, vomiting, weakness   Codeine Diarrhea, Nausea And Vomiting   Darvocet [propoxyphene N-acetaminophen] Diarrhea, Nausea And Vomiting   Erythromycin Nausea And Vomiting  Medication List       Accurate as of April 16, 2019 11:59 PM. If you have any questions, ask your nurse or doctor.        alendronate 70 MG tablet Commonly known as: FOSAMAX TAKE 1 TAB ONCE A WEEK, AT LEAST 30 MIN BEFORE 1ST FOOD.DO NOT LIE DOWN FOR 30 MIN AFTER TAKING.   amoxicillin 500 MG capsule Commonly known as: AMOXIL Take 2,000 mg by mouth See admin instructions. Take 4 tablet before dental appointment.   aspirin 81 MG chewable tablet Chew 1 tablet (81 mg total) by mouth daily.   atorvastatin 20 MG tablet Commonly known as:  LIPITOR TAKE 1 TABLET BY MOUTH DAILY.   CALCIUM 600+D3 PO Take 600 mg by mouth daily.   lisinopril 5 MG tablet Commonly known as: ZESTRIL Take 1 tablet (5 mg total) by mouth daily.   metoprolol tartrate 25 MG tablet Commonly known as: LOPRESSOR TAKE 1 TABLET BY MOUTH TWICE DAILY.   NON FORMULARY Hemp lemon salve 547m Rub on back for pain   sertraline 25 MG tablet Commonly known as: Zoloft Take 1 tablet (25 mg total) by mouth daily. Started by: [name], NP   Stool Softener 100 MG capsule Generic drug: docusate sodium Take 100 mg by mouth at bedtime.       Review of Systems:  Review of Systems  Constitutional: Positive for fatigue. Negative for activity change, appetite change, chills, diaphoresis, fever and unexpected weight change.       Sometime feels shaky/weak in general, not focal weakness or tremor.   HENT: Positive for hearing loss. Negative for congestion and voice change.   Eyes: Negative for visual disturbance.  Respiratory: Negative for cough, shortness of breath and wheezing.   Cardiovascular: Negative for chest pain, palpitations and leg swelling.  Gastrointestinal: Negative for abdominal distention, abdominal pain, constipation, diarrhea, nausea and vomiting.  Genitourinary: Positive for frequency. Negative for difficulty urinating and urgency.       2-3x/night.   Musculoskeletal: Positive for gait problem.  Skin: Negative for color change and pallor.  Neurological: Negative for dizziness, speech difficulty, weakness and headaches.       Memory lapses occasionally.   Psychiatric/Behavioral: Positive for dysphoric mood and sleep disturbance. Negative for agitation, behavioral problems and hallucinations. The patient is nervous/anxious.     Health Maintenance  Topic Date Due  . TETANUS/TDAP  07/28/2019  . INFLUENZA VACCINE  Completed  . DEXA SCAN  Completed  . PNA vac Low Risk Adult  Completed    Physical Exam: Vitals:   04/16/19 1432  BP: (!)  144/88  Pulse: (!) 56  Temp: (!) 96.9 F (36.1 C)  SpO2: 95%  Weight: 146 lb (66.2 kg)  Height: 5' 2" (1.575 m)   Body mass index is 26.7 kg/m. Physical Exam Vitals and nursing note reviewed.  Constitutional:      General: She is not in acute distress.    Appearance: Normal appearance. She is not ill-appearing, toxic-appearing or diaphoretic.  HENT:     Head: Normocephalic and atraumatic.     Nose: Nose normal.     Mouth/Throat:     Mouth: Mucous membranes are moist.  Eyes:     Extraocular Movements: Extraocular movements intact.     Conjunctiva/sclera: Conjunctivae normal.     Pupils: Pupils are equal, round, and reactive to light.     Comments: Lateral right eye peripheral vision field loss since CVA 11/2018  Cardiovascular:     Rate  and Rhythm: Normal rate and regular rhythm.     Heart sounds: No murmur.  Pulmonary:     Breath sounds: No wheezing, rhonchi or rales.  Abdominal:     General: Bowel sounds are normal. There is no distension.     Palpations: Abdomen is soft.     Tenderness: There is no abdominal tenderness. There is no right CVA tenderness, left CVA tenderness, guarding or rebound.  Musculoskeletal:     Cervical back: Normal range of motion and neck supple.     Right lower leg: No edema.     Left lower leg: No edema.  Skin:    General: Skin is warm and dry.  Neurological:     General: No focal deficit present.     Mental Status: She is alert and oriented to person, place, and time. Mental status is at baseline.     Motor: No weakness.     Coordination: Coordination normal.     Gait: Gait abnormal.  Psychiatric:     Comments: Flat affect     Labs reviewed: Basic Metabolic Panel: Recent Labs    07/10/18 0710 12/02/18 1809 12/02/18 1829 12/30/18 1020  NA  --  140 139 142  K  --  3.8 4.2 4.3  CL  --  103 110 106  CO2  --  26  --  29  GLUCOSE  --  106* 98 93  BUN  --  _0 CREATININE  --  0.84 0.70 0.76  CALCIUM  --  10.3  --  9.9  TSH  4.19  --   --   --    Liver Function Tests: Recent Labs    12/02/18 1809 12/30/18 1020  AST _1 ALT _2 ALKPHOS 57  --   BILITOT 0.9 0.5  0.5  PROT 7.8 6.7  6.7  ALBUMIN 4.5  --    No results for input(s): LIPASE, AMYLASE in the last 8760 hours. No results for input(s): AMMONIA in the last 8760 hours. CBC: Recent Labs    07/10/18 0710 12/02/18 1809 12/02/18 1829  WBC 5.8 7.5  --   NEUTROABS 3,248 5.0  --   HGB 14.2 14.4 15.3*  HCT 41.7 45.2 45.0  MCV 91.4 98.3  --   PLT 265 273  --    Lipid Panel: Recent Labs    12/30/18 1020  CHOL 116  HDL 44*  LDLCALC 57  TRIG 69  CHOLHDL 2.6   Lab Results  Component Value Date   HGBA1C  08/01/2010    5.4 (NOTE)                                                                       According to the ADA Clinical Practice Recommendations for 2011, when HbA1c is used as a screening test:   >=6.5%   Diagnostic of Diabetes Mellitus           (if abnormal result  is confirmed)  5.7-6.4%   Increased risk of developing Diabetes Mellitus  References:Diagnosis and Classification of Diabetes Mellitus,Diabetes EPPI,9518,84(ZYSAY 1):S62-S69 and Standards of Medical Care in         Diabetes - 2011,Diabetes TKZS,0109,32  (Suppl  1):S11-S61.    Procedures since last visit: No results found.  Assessment/Plan  CVA (cerebral vascular accident) ([place]) Stable, 12/08/19 neurology, left occipital ischemic stroke. Continue ASA, Atorvastatin.   Essential hypertension Blood pressure is not controlled, Sbp 160s, rechecked 140s,  denied headache, dizziness, chest pain/pressure, or palpitation,  continue Lisinopril, Metoprolol.   Osteoporosis Continue Alendronate.   Depression with anxiety Not sleep well at night, no trouble falling asleep, wakes up after 2-3 hours of after falling asleep, difficulty returning to sleep,  feel exhausted next morning, escalated worries. Will trial of Sertraline 72m qd, f/u in 4 weeks.    Labs/tests  ordered: CBC/diff, CMP/eGFR, Lipid panel prior to the next appointment.   Next appt:  05/19/2019

## 2019-04-16 NOTE — Assessment & Plan Note (Addendum)
Not sleep well at night, no trouble falling asleep, wakes up after 2-3 hours of after falling asleep, difficulty returning to sleep,  feel exhausted next morning, escalated worries. Will trial of Sertraline 25mg  qd, f/u in 4 weeks.

## 2019-04-16 NOTE — Assessment & Plan Note (Addendum)
Blood pressure is not controlled, Sbp 160s, rechecked 140s,  denied headache, dizziness, chest pain/pressure, or palpitation,  continue Lisinopril, Metoprolol.

## 2019-04-17 ENCOUNTER — Encounter: Payer: Self-pay | Admitting: Nurse Practitioner

## 2019-04-22 DIAGNOSIS — C44712 Basal cell carcinoma of skin of right lower limb, including hip: Secondary | ICD-10-CM | POA: Diagnosis not present

## 2019-04-22 DIAGNOSIS — D485 Neoplasm of uncertain behavior of skin: Secondary | ICD-10-CM | POA: Diagnosis not present

## 2019-04-22 DIAGNOSIS — L820 Inflamed seborrheic keratosis: Secondary | ICD-10-CM | POA: Diagnosis not present

## 2019-04-22 DIAGNOSIS — Z85828 Personal history of other malignant neoplasm of skin: Secondary | ICD-10-CM | POA: Diagnosis not present

## 2019-04-22 DIAGNOSIS — L821 Other seborrheic keratosis: Secondary | ICD-10-CM | POA: Diagnosis not present

## 2019-04-27 ENCOUNTER — Telehealth: Payer: Self-pay | Admitting: *Deleted

## 2019-04-27 NOTE — Telephone Encounter (Signed)
I think she can drive. She can be seen in [facility] in [facility].

## 2019-04-27 NOTE — Telephone Encounter (Signed)
Patient stated that she is having stomach pains. Stated that she has a history of Diverticulitis and it feels like this. No fever. On a clear liquid diet. Been going on for about 48 hours. Wants to know if you have anymore advise or would see her. No available appointments this week at [facility]. Patient is requesting to speak with Dr. Chales Abrahams or her Medical Assistant.  Please Advise.

## 2019-04-27 NOTE — Telephone Encounter (Signed)
Patient notified and scheduled an appointment for 2/23 at [facility] with [facility]

## 2019-04-28 ENCOUNTER — Ambulatory Visit (INDEPENDENT_AMBULATORY_CARE_PROVIDER_SITE_OTHER): Payer: Medicare PPO | Admitting: Family

## 2019-04-28 ENCOUNTER — Encounter: Payer: Self-pay | Admitting: Family

## 2019-04-28 ENCOUNTER — Other Ambulatory Visit: Payer: Self-pay

## 2019-04-28 ENCOUNTER — Encounter: Payer: Self-pay | Admitting: Nurse Practitioner

## 2019-04-28 DIAGNOSIS — R109 Unspecified abdominal pain: Secondary | ICD-10-CM

## 2019-04-28 NOTE — Patient Instructions (Signed)
-   Advised to advance her diet to regular as tolerated. - Also advised to notify provider's office or go to ED if symptoms worsen or fail to resolve.

## 2019-04-28 NOTE — Progress Notes (Signed)
Patient ID: Adriana [name], female   DOB: 02-19-1935, 85 y.o.   MRN: [id number] This service is provided via telemedicine  No vital signs collected/recorded due to the encounter was a telemedicine visit.   Location of patient (ex: home, work):  HOME   Patient consents to a telephone visit:  YES   Location of the provider (ex: office, home):  OFFICE   Name of any referring provider:  Veleta Miners, MD  Names of all persons participating in the telemedicine service and their role in the encounter:  PATIENT, Edwin Dada, Vina, Marlowe Sax, NP  Time spent on call:  4:38     Provider: Versia Mignogna FNP-C  Virgie Dad, MD  Patient Care Team: Virgie Dad, MD as PCP - General (Internal Medicine) Buford Dresser, MD as PCP - Cardiology (Cardiology) [name], [name], NP as Nurse Practitioner (Internal Medicine) Keene Breath., MD (Ophthalmology) Danella Sensing, MD as Consulting Physician (Dermatology) Danella Sensing, MD as Consulting Physician (Dermatology) Delila Pereyra, MD as Consulting Physician (Gynecology)  Extended Emergency Contact Information Primary Emergency Contact: Gaetana Michaelis [place] Phone: [phone] Relation: Daughter Secondary Emergency Contact: Black,Joel [place] Phone: [phone] Relation: Son  Code Status: Full code  Goals of care: Advanced Directive information Advanced Directives 08/07/2018  Does Patient Have a Medical Advance Directive? Yes  Type of Paramedic of [place];Living will  Does patient want to make changes to medical advance directive? No - Patient declined  Copy of [place] in Chart? Yes - validated most recent copy scanned in chart (See row information)  Pre-existing out of facility DNR order (yellow form or pink MOST form) -     Chief Complaint  Patient presents with  . Acute Visit    Abdominal pain x3 days    HPI:  Pt is a 85 y.o. female seen today for  an acute visit for evaluation of abdominal pain x 3 days.she states pain started on Sunday.The pain was located on mid-Abdomen extending slightly to lower midline part of the abdomen.The pain is described as achy.she states pain has improved since Sunday.Had x 3 bowel movements on Sunday but none since then.She has had no aggravating factors.she has not taken any medication to relieve the abdominal pain.she had lots of gas on Sunday but now abdomen just feels slightly bloated.she has been eating clear liquid diet since Sunday.she states had diverticulitis several years ago.states Temp was 98.3 yesterday.she denies any fever,chills,nausea or vomiting.she states when she made the appointment for today she had pain but doesn't think she needs any medicine now that the pain is gone.      Past Medical History:  Diagnosis Date  . Anginal pain ([place]) 08/24/2010   Echo-EF =>55%,LV normal  . Arthritis   . Cataract 12/2006   Both eyes  Epes MD  . Chest pain, unspecified 09/04/2007   Lexiscan-- EF 72%; LV normal  . Constipation    Takes flax seed and honey .  Smooth Move tea.  . Depression   . Diverticulitis   . Head injury, closed    with fall  . Hypertension   . Mitral valve prolapse   . Osteopenia   . Stroke [facility])    Past Surgical History:  Procedure Laterality Date  . ANTERIOR CERVICAL DECOMP/DISCECTOMY FUSION N/A 10/23/2012   Procedure: Cervical Five to Cervical Six, Cervical Six to Cervical Seven anterior cervical decompression with fusion plating and bonegraft;  Surgeon: Hosie Spangle, MD;  Location: [facility];  Service: Neurosurgery;  Laterality: N/A;  ANTERIOR CERVICAL DECOMPRESSION/DISCECTOMY FUSION 2 LEVELS  . BREAST CYST ASPIRATION  1990   benign  . CARDIAC CATHETERIZATION  08/01/2010   normal coronaries  . COLONOSCOPY  approx 2011  . EYE SURGERY Bilateral 2011  . intestinal blockage surgery  Feb. 2012   "kink in small intestine" per pt    Allergies  Allergen Reactions  .  Contrast Media [Iodinated Diagnostic Agents] Hives    Hives during IVP at urology office '02, requires 13 hr prep///a.calhoun  Patient came thru ER and had a 1 hour pre-medication and did fine  . Oxycodone Other (See Comments)    Decreased appetite.  Sandrea Hammond [Ibandronic Acid]     Nausea, vomiting, weakness  . Codeine Diarrhea and Nausea And Vomiting  . Darvocet [Propoxyphene N-Acetaminophen] Diarrhea and Nausea And Vomiting  . Erythromycin Nausea And Vomiting    Outpatient Encounter Medications as of 04/28/2019  Medication Sig  . alendronate (FOSAMAX) 70 MG tablet TAKE 1 TAB ONCE A WEEK, AT LEAST 30 MIN BEFORE 1ST FOOD.DO NOT LIE DOWN FOR 30 MIN AFTER TAKING.  Marland Kitchen amoxicillin (AMOXIL) 500 MG capsule Take 2,000 mg by mouth See admin instructions. Take 4 tablet before dental appointment.  Marland Kitchen aspirin 81 MG chewable tablet Chew 1 tablet (81 mg total) by mouth daily.  Marland Kitchen atorvastatin (LIPITOR) 20 MG tablet TAKE 1 TABLET BY MOUTH DAILY.  . Calcium Carb-Cholecalciferol (CALCIUM 600+D3 PO) Take 600 mg by mouth daily.   Marland Kitchen docusate sodium (STOOL SOFTENER) 100 MG capsule Take 100 mg by mouth at bedtime.  Marland Kitchen lisinopril (ZESTRIL) 5 MG tablet Take 1 tablet (5 mg total) by mouth daily.  . metoprolol tartrate (LOPRESSOR) 25 MG tablet TAKE 1 TABLET BY MOUTH TWICE DAILY.  . NON FORMULARY Hemp lemon salve 500mg  Rub on back for pain  . sertraline (ZOLOFT) 25 MG tablet Take 1 tablet (25 mg total) by mouth daily.   No facility-administered encounter medications on file as of 04/28/2019.    Review of Systems  Constitutional: Negative for chills, fatigue and fever.  HENT: Negative for congestion.   Respiratory: Negative for chest tightness, shortness of breath and wheezing.        Chronic dry cough   Cardiovascular: Negative for chest pain, palpitations and leg swelling.  Gastrointestinal: Negative for abdominal distention, blood in stool, constipation, diarrhea, nausea and vomiting.       LBM 04/26/2019 had  mid-Abdominal pain x 3 days but now today.   Genitourinary: Negative for decreased urine volume, difficulty urinating, dysuria, flank pain, frequency, urgency and vaginal discharge.  Skin: Negative for color change, pallor and rash.  Neurological: Negative for dizziness, speech difficulty, weakness, light-headedness and headaches.  Psychiatric/Behavioral: Negative for agitation and sleep disturbance. The patient is not nervous/anxious.     Immunization History  Administered Date(s) Administered  . Fluad Quad(high Dose 65+) 12/17/2018  . Influenza, High Dose Seasonal PF 12/08/2016  . Moderna SARS-COVID-2 Vaccination 03/09/2019, 04/06/2019  . Pneumococcal Conjugate-13 06/12/2013  . Pneumococcal Polysaccharide-23 07/28/2002  . Tdap 07/27/2009  . Zoster Recombinat (Shingrix) 01/08/2017, 06/03/2017   Pertinent  Health Maintenance Due  Topic Date Due  . INFLUENZA VACCINE  Completed  . DEXA SCAN  Completed  . PNA vac Low Risk Adult  Completed   Fall Risk  04/28/2019 01/02/2019 12/04/2018 07/03/2018 09/03/2017  Falls in the past year? 0 0 0 1 No  Number falls in past yr: 0 0 0 0 -  Injury with Fall? 0 - - 0 -   There were no vitals filed for this visit. There is no height or weight on file to calculate BMI. Physical Exam Unable to complete on telephone visit.   Labs reviewed: Recent Labs    12/02/18 1809 12/02/18 1829 12/30/18 1020  NA 140 139 142  K 3.8 4.2 4.3  CL 103 110 106  CO2 26  --  29  GLUCOSE 106* 98 93  BUN 13 17 19   CREATININE 0.84 0.70 0.76  CALCIUM 10.3  --  9.9   Recent Labs    12/02/18 1809 12/30/18 1020  AST 31 21  21   ALT 16 14  14   ALKPHOS 57  --   BILITOT 0.9 0.5  0.5  PROT 7.8 6.7  6.7  ALBUMIN 4.5  --    Recent Labs    07/10/18 0710 12/02/18 1809 12/02/18 1829  WBC 5.8 7.5  --   NEUTROABS 3,248 5.0  --   HGB 14.2 14.4 15.3*  HCT 41.7 45.2 45.0  MCV 91.4 98.3  --   PLT 265 273  --    Lab Results  Component Value Date   TSH 4.19  07/10/2018   Lab Results  Component Value Date   HGBA1C  08/01/2010    5.4 (NOTE)                                                                       According to the ADA Clinical Practice Recommendations for 2011, when HbA1c is used as a screening test:   >=6.5%   Diagnostic of Diabetes Mellitus           (if abnormal result  is confirmed)  5.7-6.4%   Increased risk of developing Diabetes Mellitus  References:Diagnosis and Classification of Diabetes Mellitus,Diabetes Care,2011,34(Suppl 1):S62-S69 and Standards of Medical Care in         Diabetes - 2011,Diabetes Care,2011,34  (Suppl 1):S11-S61.   Lab Results  Component Value Date   CHOL 116 12/30/2018   HDL 44 (L) 12/30/2018   LDLCALC 57 12/30/2018   TRIG 69 12/30/2018   CHOLHDL 2.6 12/30/2018    Significant Diagnostic Results in last 30 days:  No results found.  Assessment/Plan  Abdominal pain, unspecified abdominal location Reports no fever,chills,N/V/D or constipation.Unclear etiology of abdominal pain.Pain has resolved since making her appointment.tolerating clear liquid diet. - Advised to advance her diet to regular as tolerated. - Also advised to notify provider's office or go to ED if symptoms worsen or fail to resolve.   Family/ staff Communication: Reviewed plan of care with patient  Labs/tests ordered: None   Next Appointment:Has upcoming appointment with Dr.Gupta 05/22/2019 Spent 15 minutes of non-face to face with patient.    01/01/2019, NP

## 2019-04-30 ENCOUNTER — Encounter: Payer: Self-pay | Admitting: Family

## 2019-04-30 ENCOUNTER — Other Ambulatory Visit: Payer: Self-pay

## 2019-04-30 ENCOUNTER — Ambulatory Visit (INDEPENDENT_AMBULATORY_CARE_PROVIDER_SITE_OTHER): Payer: Medicare PPO | Admitting: Family

## 2019-04-30 DIAGNOSIS — R109 Unspecified abdominal pain: Secondary | ICD-10-CM | POA: Diagnosis not present

## 2019-04-30 DIAGNOSIS — R1912 Hyperactive bowel sounds: Secondary | ICD-10-CM | POA: Diagnosis not present

## 2019-04-30 NOTE — Progress Notes (Signed)
Patient ID: Adriana [name], female   DOB: May 23, 1934, 85 y.o.   MRN: [id number] This service is provided via telemedicine  No vital signs collected/recorded due to the encounter was a telemedicine visit.   Location of patient (ex: home, work):  HOME  Patient consents to a telephone visit:  YES  Location of the provider (ex: office, home):  OFFICE  Name of any referring provider:  Veleta Miners, MD  Names of all persons participating in the telemedicine service and their role in the encounter:  PATIENT, Edwin Dada, [place], Marlowe Sax, NP  Time spent on call:  3:08    Provider: Jayliana Valencia FNP-C  Virgie Dad, MD  Patient Care Team: Virgie Dad, MD as PCP - General (Internal Medicine) Buford Dresser, MD as PCP - Cardiology (Cardiology) [name], [name], NP as Nurse Practitioner (Internal Medicine) Keene Breath., MD (Ophthalmology) Danella Sensing, MD as Consulting Physician (Dermatology) Danella Sensing, MD as Consulting Physician (Dermatology) Delila Pereyra, MD as Consulting Physician (Gynecology)  Extended Emergency Contact Information Primary Emergency Contact: Gaetana Michaelis [place] Phone: [phone] Relation: Daughter Secondary Emergency Contact: Black,Joel [place] Phone: [phone] Relation: Son  Code Status:  Full Code  Goals of care: Advanced Directive information Advanced Directives 08/07/2018  Does Patient Have a Medical Advance Directive? Yes  Type of Paramedic of [place];Living will  Does patient want to make changes to medical advance directive? No - Patient declined  Copy of [place] in Chart? Yes - validated most recent copy scanned in chart (See row information)  Pre-existing out of facility DNR order (yellow form or pink MOST form) -     Chief Complaint  Patient presents with  . Acute Visit    abdominal pain    HPI:  Pt is a 85 y.o. female seen today for an acute  visit for evaluation of Abdominal pain.she states was feeling better since she had a telephone visit with me on 04/28/2019 for mid- lower abdominal pain,had normal Bowel movements  Though was concerned since she had x 3 BM's.she reported abdominal pain was gone on last visit but felt bloated.she was on clear liquid diet.she was advised to advance her diet to regular as tolerated and to notify provider if symptoms worsen or fail to improve. She states was feeling well.Yesterday felt so hungry so she eat large amounts including crackers,one can of chicken Nodules soup but didn't eat the chicken,marsh potatoes,gritts and yorgurt.she has been drinking apple juice,cranberry juice and water.she woke up this morning with abdominal before getting out of bed.Her stomach making a lot of noises and slightly tender on the lower- mid abdomen.No bowel movement since 04/26/2019. thinks might be diverticulitis that she had in the past.She denies any fever,chills,abdominal cramping,nausea or vomiting.    Past Medical History:  Diagnosis Date  . Anginal pain ([place]) 08/24/2010   Echo-EF =>55%,LV normal  . Arthritis   . Cataract 12/2006   Both eyes  Epes MD  . Chest pain, unspecified 09/04/2007   Lexiscan-- EF 72%; LV normal  . Constipation    Takes flax seed and honey .  Smooth Move tea.  . Depression   . Diverticulitis   . Head injury, closed    with fall  . Hypertension   . Mitral valve prolapse   . Osteopenia   . Stroke [facility])    Past Surgical History:  Procedure Laterality Date  . ANTERIOR CERVICAL DECOMP/DISCECTOMY FUSION N/A 10/23/2012   Procedure:  Cervical Five to Cervical Six, Cervical Six to Cervical Seven anterior cervical decompression with fusion plating and bonegraft;  Surgeon: Hewitt Shorts, MD;  Location: [facility];  Service: Neurosurgery;  Laterality: N/A;  ANTERIOR CERVICAL DECOMPRESSION/DISCECTOMY FUSION 2 LEVELS  . BREAST CYST ASPIRATION  1990   benign  . CARDIAC CATHETERIZATION   08/01/2010   normal coronaries  . COLONOSCOPY  approx 2011  . EYE SURGERY Bilateral 2011  . intestinal blockage surgery  Feb. 2012   "kink in small intestine" per pt    Allergies  Allergen Reactions  . Contrast Media [Iodinated Diagnostic Agents] Hives    Hives during IVP at urology office '02, requires 13 hr prep///a.calhoun  Patient came thru ER and had a 1 hour pre-medication and did fine  . Oxycodone Other (See Comments)    Decreased appetite.  Sandrea Hammond [Ibandronic Acid]     Nausea, vomiting, weakness  . Codeine Diarrhea and Nausea And Vomiting  . Darvocet [Propoxyphene N-Acetaminophen] Diarrhea and Nausea And Vomiting  . Erythromycin Nausea And Vomiting    Outpatient Encounter Medications as of 04/30/2019  Medication Sig  . alendronate (FOSAMAX) 70 MG tablet TAKE 1 TAB ONCE A WEEK, AT LEAST 30 MIN BEFORE 1ST FOOD.DO NOT LIE DOWN FOR 30 MIN AFTER TAKING.  Marland Kitchen amoxicillin (AMOXIL) 500 MG capsule Take 2,000 mg by mouth See admin instructions. Take 4 tablet before dental appointment.  Marland Kitchen aspirin 81 MG chewable tablet Chew 1 tablet (81 mg total) by mouth daily.  Marland Kitchen atorvastatin (LIPITOR) 20 MG tablet TAKE 1 TABLET BY MOUTH DAILY.  . Calcium Carb-Cholecalciferol (CALCIUM 600+D3 PO) Take 600 mg by mouth daily.   Marland Kitchen docusate sodium (STOOL SOFTENER) 100 MG capsule Take 100 mg by mouth at bedtime.  Marland Kitchen lisinopril (ZESTRIL) 5 MG tablet Take 1 tablet (5 mg total) by mouth daily.  . metoprolol tartrate (LOPRESSOR) 25 MG tablet TAKE 1 TABLET BY MOUTH TWICE DAILY.  . NON FORMULARY Hemp lemon salve 500mg  Rub on back for pain  . sertraline (ZOLOFT) 25 MG tablet Take 1 tablet (25 mg total) by mouth daily.   No facility-administered encounter medications on file as of 04/30/2019.    Review of Systems  Constitutional: Negative for appetite change, chills, fatigue and fever.  HENT: Negative for congestion, rhinorrhea, sinus pressure, sinus pain, sneezing and sore throat.   Respiratory: Negative for  cough, chest tightness, shortness of breath and wheezing.   Cardiovascular: Negative for chest pain, palpitations and leg swelling.  Gastrointestinal: Positive for abdominal pain. Negative for abdominal distention, blood in stool, constipation, diarrhea, nausea and vomiting.       Feels bloated.LBM 04/26/2019   Skin: Negative for color change, pallor and rash.  Neurological: Negative for dizziness, speech difficulty, light-headedness, numbness and headaches.  Psychiatric/Behavioral: Negative for agitation, behavioral problems and sleep disturbance. The patient is not nervous/anxious.     Immunization History  Administered Date(s) Administered  . Fluad Quad(high Dose 65+) 12/17/2018  . Influenza, High Dose Seasonal PF 12/08/2016  . Moderna SARS-COVID-2 Vaccination 03/09/2019, 04/06/2019  . Pneumococcal Conjugate-13 06/12/2013  . Pneumococcal Polysaccharide-23 07/28/2002  . Tdap 07/27/2009  . Zoster Recombinat (Shingrix) 01/08/2017, 06/03/2017   Pertinent  Health Maintenance Due  Topic Date Due  . INFLUENZA VACCINE  Completed  . DEXA SCAN  Completed  . PNA vac Low Risk Adult  Completed   Fall Risk  04/28/2019 01/02/2019 12/04/2018 07/03/2018 09/03/2017  Falls in the past year? 0 0 0 1 No  Number falls in  past yr: 0 0 0 0 -  Injury with Fall? 0 - - 0 -   There were no vitals filed for this visit. There is no height or weight on file to calculate BMI. Physical Exam Unable to complete on Telephone visit.  Labs reviewed: Recent Labs    12/02/18 1809 12/02/18 1829 12/30/18 1020  NA 140 139 142  K 3.8 4.2 4.3  CL 103 110 106  CO2 26  --  29  GLUCOSE 106* 98 93  BUN 13 17 19   CREATININE 0.84 0.70 0.76  CALCIUM 10.3  --  9.9   Recent Labs    12/02/18 1809 12/30/18 1020  AST 31 21  21   ALT 16 14  14   ALKPHOS 57  --   BILITOT 0.9 0.5  0.5  PROT 7.8 6.7  6.7  ALBUMIN 4.5  --    Recent Labs    07/10/18 0710 12/02/18 1809 12/02/18 1829  WBC 5.8 7.5  --   NEUTROABS  3,248 5.0  --   HGB 14.2 14.4 15.3*  HCT 41.7 45.2 45.0  MCV 91.4 98.3  --   PLT 265 273  --    Lab Results  Component Value Date   TSH 4.19 07/10/2018   Lab Results  Component Value Date   HGBA1C  08/01/2010    5.4 (NOTE)                                                                       According to the ADA Clinical Practice Recommendations for 2011, when HbA1c is used as a screening test:   >=6.5%   Diagnostic of Diabetes Mellitus           (if abnormal result  is confirmed)  5.7-6.4%   Increased risk of developing Diabetes Mellitus  References:Diagnosis and Classification of Diabetes Mellitus,Diabetes Care,2011,34(Suppl 1):S62-S69 and Standards of Medical Care in         Diabetes - 2011,Diabetes Care,2011,34  (Suppl 1):S11-S61.   Lab Results  Component Value Date   CHOL 116 12/30/2018   HDL 44 (L) 12/30/2018   LDLCALC 57 12/30/2018   TRIG 69 12/30/2018   CHOLHDL 2.6 12/30/2018    Significant Diagnostic Results in last 30 days:  No results found.  Assessment/Plan 1. Hyperactive bowel sounds Worst this morning prior to getting out of bed.Also feels bloated.Unable to assess on Telephone visit.MD not available today at the facility to assess and declines evaluation in the ER.Agrees to getting X-ray done to rule out obstruction. Advised to get X-ray done at [facility] [address] over [place].states familiar with the imaging place will get her daughter to take her.   - DG Abd 2 Views; Future  2. Abdominal pain, unspecified abdominal location Reports mid-lower abdominal pain umbilicus though thinks might be diverticulitis that she had in the past. Advised to schedule an appointment with Dr.Gupta to evaluate location of pain.Pain seems to be on and off. - Advised to Go to ED if symptoms worse.    - DG Abd 2 Views; Future  Family/ staff Communication: Reviewed plan of care with patient  Labs/tests ordered: None   Next Appointment:  As soon as possible with  Dr.Gupta at the facility.  Spent 11 minutes of non-face to face with patient    Caesar Bookman, NP

## 2019-04-30 NOTE — Patient Instructions (Addendum)
-   Please get Abdominal X-ray done at [facility] over [place] as soon as possible to rule out obstruction.will call you with the results.   - go to ED if symptoms worsen.  - Follow up with Dr[facility]Gupta at [facility] as directed.

## 2019-05-04 ENCOUNTER — Ambulatory Visit
Admission: RE | Admit: 2019-05-04 | Discharge: 2019-05-04 | Disposition: A | Discharge status: Home / Self Care | Payer: Medicare Other | Source: Ambulatory Visit | Attending: Family | Admitting: Family

## 2019-05-04 ENCOUNTER — Other Ambulatory Visit: Payer: Self-pay

## 2019-05-04 DIAGNOSIS — R1912 Hyperactive bowel sounds: Secondary | ICD-10-CM | POA: Diagnosis not present

## 2019-05-04 DIAGNOSIS — R109 Unspecified abdominal pain: Secondary | ICD-10-CM

## 2019-05-05 ENCOUNTER — Other Ambulatory Visit: Payer: Self-pay | Admitting: Nurse Practitioner

## 2019-05-05 DIAGNOSIS — I10 Essential (primary) hypertension: Secondary | ICD-10-CM

## 2019-05-08 ENCOUNTER — Other Ambulatory Visit: Payer: Self-pay

## 2019-05-08 ENCOUNTER — Non-Acute Institutional Stay: Payer: Medicare PPO | Admitting: Internal Medicine

## 2019-05-08 ENCOUNTER — Encounter: Payer: Self-pay | Admitting: Internal Medicine

## 2019-05-08 VITALS — BP 136/80 | HR 60 | Temp 96.8°F | Ht 62.0 in | Wt 139.6 lb

## 2019-05-08 DIAGNOSIS — R109 Unspecified abdominal pain: Secondary | ICD-10-CM

## 2019-05-08 DIAGNOSIS — K5792 Diverticulitis of intestine, part unspecified, without perforation or abscess without bleeding: Secondary | ICD-10-CM | POA: Diagnosis not present

## 2019-05-08 MED ORDER — METRONIDAZOLE 500 MG PO TABS: 500.0000 mg | ORAL_TABLET | Freq: Three times a day (TID) | ORAL | 0 refills | Status: DC

## 2019-05-08 MED ORDER — CIPROFLOXACIN HCL 500 MG PO TABS: 500.0000 mg | ORAL_TABLET | Freq: Two times a day (BID) | ORAL | 0 refills | Status: DC

## 2019-05-09 ENCOUNTER — Encounter: Payer: Self-pay | Admitting: Internal Medicine

## 2019-05-09 NOTE — Progress Notes (Signed)
Location: [facility] Special educational needs teacher of Service:  Clinic (12)  Provider:   Code Status:  Goals of Care:  Advanced Directives 08/07/2018  Does Patient Have a Medical Advance Directive? Yes  Type of Estate agent of [place];Living will  Does patient want to make changes to medical advance directive? No - Patient declined  Copy of Healthcare Power of Attorney in Chart? Yes - validated most recent copy scanned in chart (See row information)  Pre-existing out of facility DNR order (yellow form or pink MOST form) -     Chief Complaint  Patient presents with  . Acute Visit    Stomach issues    HPI: Patient is a 85 y.o. female seen today for an acute visit for Lower Abdominal Pain Patient has h/o posterior CVA, pericardial effusion, depression, diverticulosis.  She says for the past 2 to 3 weeks she has this lower abdominal pain more on the right side sometimes midepigastric.  She does not have any fever chills nausea or vomiting.  She does have loose stools but not diarrhea.  No blood in her stools.  She has been on clear liquids diet for last few weeks.  She said every time she tries to eat her stomach starts hurting again.  She had abdominal x-ray ordered which did not show any acute issues.  She has lost almost 3 to 4 pounds in past few weeks  Past Medical History:  Diagnosis Date  . Anginal pain ([facility]) 08/24/2010   Echo-EF =>55%,LV normal  . Arthritis   . Cataract 12/2006   Both eyes  Epes MD  . Chest pain, unspecified 09/04/2007   Lexiscan-- EF 72%; LV normal  . Constipation    Takes flax seed and honey .  Smooth Move tea.  . Depression   . Diverticulitis   . Head injury, closed    with fall  . Hypertension   . Mitral valve prolapse   . Osteopenia   . Stroke [facility])     Past Surgical History:  Procedure Laterality Date  . ANTERIOR CERVICAL DECOMP/DISCECTOMY FUSION N/A 10/23/2012   Procedure: Cervical Five to Cervical Six, Cervical Six to  Cervical Seven anterior cervical decompression with fusion plating and bonegraft;  Surgeon: Hewitt Shorts, MD;  Location: [facility];  Service: Neurosurgery;  Laterality: N/A;  ANTERIOR CERVICAL DECOMPRESSION/DISCECTOMY FUSION 2 LEVELS  . BREAST CYST ASPIRATION  1990   benign  . CARDIAC CATHETERIZATION  08/01/2010   normal coronaries  . COLONOSCOPY  approx 2011  . EYE SURGERY Bilateral 2011  . intestinal blockage surgery  Feb. 2012   "kink in small intestine" per pt    Allergies  Allergen Reactions  . Contrast Media [Iodinated Diagnostic Agents] Hives    Hives during IVP at urology office '02, requires 13 hr prep///a.calhoun  Patient came thru ER and had a 1 hour pre-medication and did fine  . Oxycodone Other (See Comments)    Decreased appetite.  Sandrea Hammond [Ibandronic Acid]     Nausea, vomiting, weakness  . Codeine Diarrhea and Nausea And Vomiting  . Darvocet [Propoxyphene N-Acetaminophen] Diarrhea and Nausea And Vomiting  . Erythromycin Nausea And Vomiting    Outpatient Encounter Medications as of 05/08/2019  Medication Sig  . alendronate (FOSAMAX) 70 MG tablet TAKE 1 TAB ONCE A WEEK, AT LEAST 30 MIN BEFORE 1ST FOOD.DO NOT LIE DOWN FOR 30 MIN AFTER TAKING.  Marland Kitchen amoxicillin (AMOXIL) 500 MG capsule Take 2,000 mg by mouth See admin  instructions. Take 4 tablet before dental appointment.  Marland Kitchen aspirin 81 MG chewable tablet Chew 1 tablet (81 mg total) by mouth daily.  Marland Kitchen atorvastatin (LIPITOR) 20 MG tablet TAKE 1 TABLET BY MOUTH DAILY.  . Calcium Carb-Cholecalciferol (CALCIUM 600+D3 PO) Take 600 mg by mouth daily.   Marland Kitchen docusate sodium (STOOL SOFTENER) 100 MG capsule Take 100 mg by mouth at bedtime.  Marland Kitchen lisinopril (ZESTRIL) 5 MG tablet TAKE 1 TABLET ONCE DAILY.  . metoprolol tartrate (LOPRESSOR) 25 MG tablet TAKE 1 TABLET BY MOUTH TWICE DAILY.  . NON FORMULARY Hemp lemon salve 500mg  Rub on back for pain  . sertraline (ZOLOFT) 25 MG tablet Take 1 tablet (25 mg total) by mouth daily.  .  ciprofloxacin (CIPRO) 500 MG tablet Take 1 tablet (500 mg total) by mouth 2 (two) times daily.  . metroNIDAZOLE (FLAGYL) 500 MG tablet Take 1 tablet (500 mg total) by mouth 3 (three) times daily.   No facility-administered encounter medications on file as of 05/08/2019.    Review of Systems:  Review of Systems  Constitutional: Positive for activity change and appetite change.  HENT: Negative.   Respiratory: Negative.   Cardiovascular: Negative.   Gastrointestinal: Positive for abdominal pain.  Genitourinary: Negative.   Musculoskeletal: Negative.   Skin: Negative.   Neurological: Positive for weakness.  Psychiatric/Behavioral: Negative.   All other systems reviewed and are negative.   Health Maintenance  Topic Date Due  . TETANUS/TDAP  07/28/2019  . INFLUENZA VACCINE  Completed  . DEXA SCAN  Completed  . PNA vac Low Risk Adult  Completed    Physical Exam: Vitals:   05/08/19 1143  BP: 136/80  Pulse: (!) 48 Manually Counted was 60  Temp: (!) 96.8 F (36 C)  SpO2: 98%  Weight: 139 lb 9.6 oz (63.3 kg)  Height: 5\' 2"  (1.575 m)   Body mass index is 25.53 kg/m. Physical Exam  Constitutional: Oriented to person, place, and time. Well-developed and well-nourished.  HENT:  Head: Normocephalic.  Mouth/Throat: Oropharynx is clear and moist.  Eyes: Pupils are equal, round, and reactive to light.  Neck: Neck supple.  Cardiovascular: Normal rate and normal heart sounds.  No murmur heard. Pulmonary/Chest: Effort normal and breath sounds normal. No respiratory distress. No wheezes. She has no rales.  Abdominal: Soft. Tender in Right Lower quadrant. No Masses were Felt. No Rebound Tenderness. Some tenderness around Mid Umbilical. BS were Incrased  Musculoskeletal: No edema.  Lymphadenopathy: none Neurological: Alert and oriented to person, place, and time.  Skin: Skin is warm and dry.  Psychiatric: Normal mood and affect. Behavior is normal. Thought content normal.    Labs  reviewed: Basic Metabolic Panel: Recent Labs    07/10/18 0710 12/02/18 1809 12/02/18 1829 12/30/18 1020  NA  --  140 139 142  K  --  3.8 4.2 4.3  CL  --  103 110 106  CO2  --  26  --  29  GLUCOSE  --  106* 98 93  BUN  --  13 17 19   CREATININE  --  0.84 0.70 0.76  CALCIUM  --  10.3  --  9.9  TSH 4.19  --   --   --    Liver Function Tests: Recent Labs    12/02/18 1809 12/30/18 1020  AST 31 21  21   ALT 16 14  14   ALKPHOS 57  --   BILITOT 0.9 0.5  0.5  PROT 7.8 6.7  6.7  ALBUMIN 4.5  --  No results for input(s): LIPASE, AMYLASE in the last 8760 hours. No results for input(s): AMMONIA in the last 8760 hours. CBC: Recent Labs    07/10/18 0710 12/02/18 1809 12/02/18 1829  WBC 5.8 7.5  --   NEUTROABS 3,248 5.0  --   HGB 14.2 14.4 15.3*  HCT 41.7 45.2 45.0  MCV 91.4 98.3  --   PLT 265 273  --    Lipid Panel: Recent Labs    12/30/18 1020  CHOL 116  HDL 44*  LDLCALC 57  TRIG 69  CHOLHDL 2.6   Lab Results  Component Value Date   HGBA1C  08/01/2010    5.4 (NOTE)                                                                       According to the ADA Clinical Practice Recommendations for 2011, when HbA1c is used as a screening test:   >=6.5%   Diagnostic of Diabetes Mellitus           (if abnormal result  is confirmed)  5.7-6.4%   Increased risk of developing Diabetes Mellitus  References:Diagnosis and Classification of Diabetes Mellitus,Diabetes Care,2011,34(Suppl 1):S62-S69 and Standards of Medical Care in         Diabetes - 2011,Diabetes Care,2011,34  (Suppl 1):S11-S61.    Procedures since last visit: DG Abd 2 Views  Result Date: 05/04/2019 CLINICAL DATA:  Hyperactive bowel sounds.  Abdominal pain EXAM: ABDOMEN - 2 VIEW COMPARISON:  08/30/2015 FINDINGS: Normal bowel gas pattern.  No obstruction or free air. No urinary tract calculi. Moderate levoscoliosis with multilevel degenerative change in the lumbar spine. IMPRESSION: No acute abnormality.  Lumbar  scoliosis and degenerative change. Electronically Signed   By: Marlan Palau M.D.   On: 05/04/2019 16:27    Assessment/Plan 1. Acute diverticulitis Patient has a history of diverticulitis Will treat empirically with antibiotics Cipro and Flagyl call for 7 days side effects were discussed Also will get - Hepatic Function Panel; Future - Lipase; Future CRP And a CBC with Diff  2. Abdominal pain, unspecified abdominal location Treating empirically for diverticulitis Will get labs.  If not better will consider CT scan She has a follow-up appointment in 2 weeks    Labs/tests ordered:  * No order type specified * Next appt:  05/12/2019

## 2019-05-11 ENCOUNTER — Other Ambulatory Visit: Payer: Self-pay | Admitting: Internal Medicine

## 2019-05-11 ENCOUNTER — Telehealth: Payer: Self-pay | Admitting: Family

## 2019-05-11 MED ORDER — ONDANSETRON HCL 4 MG PO TABS: 4.0000 mg | ORAL_TABLET | Freq: Three times a day (TID) | ORAL | 0 refills | Status: DC | PRN

## 2019-05-11 NOTE — Telephone Encounter (Signed)
Patient called the office again stating that if Rx called in before 1:30 she can get today.  She states that she would really like to have it today.

## 2019-05-11 NOTE — Telephone Encounter (Signed)
Late Entry: 05/10/2019 patient called to let Dr.gupta know that the Flagyl that was recently prescribed was making her sick on her stomach and has stopped taking it.she will call on Monday to schedule appointment with Dr.Gupta.

## 2019-05-11 NOTE — Telephone Encounter (Signed)
I will call in Zofran 4mg  Q6 hour for Nausea for 1 week

## 2019-05-11 NOTE — Telephone Encounter (Signed)
Patient called this a.m. complaining of nausea with Cipro and Flagyl, but stated she gets nauseated within 30 minutes of taking the Flagyl and feels like that is the main medication causing the nausea.  She is requesting a medication for the nausea.  She has a lab appointment tomorrow, 3/9.  She also has a followup appointment on 05/22/19.

## 2019-05-12 ENCOUNTER — Other Ambulatory Visit: Payer: Self-pay

## 2019-05-12 DIAGNOSIS — K5792 Diverticulitis of intestine, part unspecified, without perforation or abscess without bleeding: Secondary | ICD-10-CM | POA: Diagnosis not present

## 2019-05-12 DIAGNOSIS — I10 Essential (primary) hypertension: Secondary | ICD-10-CM

## 2019-05-12 DIAGNOSIS — R109 Unspecified abdominal pain: Secondary | ICD-10-CM

## 2019-05-12 NOTE — Telephone Encounter (Signed)
Patient notified and agreed.  

## 2019-05-13 LAB — CBC
HCT: 41.6 % (ref 35.0–45.0)
Hemoglobin: 14.2 g/dL (ref 11.7–15.5)
MCH: 31.3 pg (ref 27.0–33.0)
MCHC: 34.1 g/dL (ref 32.0–36.0)
MCV: 91.6 fL (ref 80.0–100.0)
MPV: 11.7 fL (ref 7.5–12.5)
Platelets: 216 10*3/uL (ref 140–400)
RBC: 4.54 10*6/uL (ref 3.80–5.10)
RDW: 11.8 % (ref 11.0–15.0)
WBC: 4.9 10*3/uL (ref 3.8–10.8)

## 2019-05-13 LAB — COMPLETE METABOLIC PANEL WITH GFR
AG Ratio: 1.8 (calc) (ref 1.0–2.5)
ALT: 23 U/L (ref 6–29)
AST: 42 U/L — ABNORMAL HIGH (ref 10–35)
Albumin: 4.4 g/dL (ref 3.6–5.1)
Alkaline phosphatase (APISO): 50 U/L (ref 37–153)
BUN/Creatinine Ratio: 5 (calc) — ABNORMAL LOW (ref 6–22)
BUN: 5 mg/dL — ABNORMAL LOW (ref 7–25)
CO2: 30 mmol/L (ref 20–32)
Calcium: 10.1 mg/dL (ref 8.6–10.4)
Chloride: 102 mmol/L (ref 98–110)
Creat: 1 mg/dL — ABNORMAL HIGH (ref 0.60–0.88)
GFR, Est African American: 61 mL/min/{1.73_m2} (ref >=60)
GFR, Est Non African American: 52 mL/min/{1.73_m2} — ABNORMAL LOW (ref >=60)
Globulin: 2.5 g/dL (calc) (ref 1.9–3.7)
Glucose, Bld: 97 mg/dL (ref 65–99)
Potassium: 3.7 mmol/L (ref 3.5–5.3)
Sodium: 139 mmol/L (ref 135–146)
Total Bilirubin: 0.7 mg/dL (ref 0.2–1.2)
Total Protein: 6.9 g/dL (ref 6.1–8.1)

## 2019-05-13 LAB — HEPATIC FUNCTION PANEL
AG Ratio: 1.8 (calc) (ref 1.0–2.5)
ALT: 23 U/L (ref 6–29)
AST: 42 U/L — ABNORMAL HIGH (ref 10–35)
Albumin: 4.4 g/dL (ref 3.6–5.1)
Alkaline phosphatase (APISO): 50 U/L (ref 37–153)
Bilirubin, Direct: 0.2 mg/dL (ref 0.0–0.2)
Globulin: 2.5 g/dL (calc) (ref 1.9–3.7)
Indirect Bilirubin: 0.5 mg/dL (calc) (ref 0.2–1.2)
Total Bilirubin: 0.7 mg/dL (ref 0.2–1.2)
Total Protein: 6.9 g/dL (ref 6.1–8.1)

## 2019-05-13 LAB — LIPID PANEL
Cholesterol: 84 mg/dL (ref <200)
HDL: 41 mg/dL — ABNORMAL LOW (ref >=50)
LDL Cholesterol (Calc): 29 mg/dL (calc)
Non-HDL Cholesterol (Calc): 43 mg/dL (calc) (ref <130)
Total CHOL/HDL Ratio: 2 (calc) (ref <5.0)
Triglycerides: 67 mg/dL (ref <150)

## 2019-05-13 LAB — LIPASE: Lipase: 81 U/L — ABNORMAL HIGH (ref 7–60)

## 2019-05-13 LAB — C-REACTIVE PROTEIN: CRP: 0.3 mg/L (ref <8.0)

## 2019-05-22 ENCOUNTER — Non-Acute Institutional Stay: Payer: Medicare PPO | Admitting: Internal Medicine

## 2019-05-22 ENCOUNTER — Encounter: Payer: Self-pay | Admitting: Internal Medicine

## 2019-05-22 ENCOUNTER — Other Ambulatory Visit: Payer: Self-pay

## 2019-05-22 VITALS — BP 132/80 | HR 60 | Temp 97.1°F | Ht 62.0 in | Wt 136.8 lb

## 2019-05-22 DIAGNOSIS — K5792 Diverticulitis of intestine, part unspecified, without perforation or abscess without bleeding: Secondary | ICD-10-CM | POA: Diagnosis not present

## 2019-05-22 DIAGNOSIS — I639 Cerebral infarction, unspecified: Secondary | ICD-10-CM

## 2019-05-22 DIAGNOSIS — I3139 Other pericardial effusion (noninflammatory): Secondary | ICD-10-CM

## 2019-05-22 DIAGNOSIS — F418 Other specified anxiety disorders: Secondary | ICD-10-CM

## 2019-05-22 DIAGNOSIS — I10 Essential (primary) hypertension: Secondary | ICD-10-CM | POA: Diagnosis not present

## 2019-05-22 DIAGNOSIS — E785 Hyperlipidemia, unspecified: Secondary | ICD-10-CM

## 2019-05-22 DIAGNOSIS — M810 Age-related osteoporosis without current pathological fracture: Secondary | ICD-10-CM | POA: Diagnosis not present

## 2019-05-22 DIAGNOSIS — I313 Pericardial effusion (noninflammatory): Secondary | ICD-10-CM

## 2019-05-22 NOTE — Patient Instructions (Signed)
Decrease atorvastatin to 10 mg.

## 2019-05-22 NOTE — Progress Notes (Signed)
Location: [facility] of Service:  Clinic (12)  Provider:   Code Status:  Goals of Care:  Advanced Directives 05/22/2019  Does Patient Have a Medical Advance Directive? Yes  Type of Advance Directive Living will;Healthcare Power of Attorney  Does patient want to make changes to medical advance directive? -  Copy of Healthcare Power of Attorney in Chart? Yes - validated most recent copy scanned in chart (See row information)  Pre-existing out of facility DNR order (yellow form or pink MOST form) -     Chief Complaint  Patient presents with  . Medical Management of Chronic Issues    Follow up. Patient is assuming she has diverticulitis.     HPI: Patient is a 85 y.o. female seen today for an acute visit for Follow up and Review her Meds Patient has h/o posterior CVA, pericardial effusion, depression, diverticulosis.  Acute diverticulitis Was seen 2 weeks ago for right lower abdominal pain mostly on the right lower side with loose stools.  She was treated with Cipro and Flagyl for acute diverticulitis.  She did have some nausea with Flagyl and use Zofran as needed.  She says since then she is feeling better is eating better.  But still continues to have some loose stools.  Her pain is resolved but she feels that her bowels are not normal yet.  She is also sometimes scared to eat because she does not want a flareup of her diverticulitis.  Acute posterior CVA Patient had acute stroke in 9/20.It was Small Subacute Left Occipital Cortex with Severe Narrowing in P3-4 She has recovered well and doing okay Continues to have proximal muscle weakness and tunnel vision.   Moderate pericardial effusion Was found on her echo Patient continues to be completely asymptomatic Was seen by cardiology and they repeated the echo Since the fluid was stable nothing to be done unless patient becomes symptomatic  Past Medical History:  Diagnosis Date  . Anginal pain (HCC)  08/24/2010   Echo-EF =>55%,LV normal  . Arthritis   . Cataract 12/2006   Both eyes  Epes MD  . Chest pain, unspecified 09/04/2007   Lexiscan-- EF 72%; LV normal  . Constipation    Takes flax seed and honey .  Smooth Move tea.  . Depression   . Diverticulitis   . Head injury, closed    with fall  . Hypertension   . Mitral valve prolapse   . Osteopenia   . Stroke [facility])     Past Surgical History:  Procedure Laterality Date  . ANTERIOR CERVICAL DECOMP/DISCECTOMY FUSION N/A 10/23/2012   Procedure: Cervical Five to Cervical Six, Cervical Six to Cervical Seven anterior cervical decompression with fusion plating and bonegraft;  Surgeon: Hewitt Shorts, MD;  Location: [facility];  Service: Neurosurgery;  Laterality: N/A;  ANTERIOR CERVICAL DECOMPRESSION/DISCECTOMY FUSION 2 LEVELS  . BREAST CYST ASPIRATION  1990   benign  . CARDIAC CATHETERIZATION  08/01/2010   normal coronaries  . COLONOSCOPY  approx 2011  . EYE SURGERY Bilateral 2011  . intestinal blockage surgery  Feb. 2012   "kink in small intestine" per pt    Allergies  Allergen Reactions  . Contrast Media [Iodinated Diagnostic Agents] Hives    Hives during IVP at urology office '02, requires 13 hr prep///a.calhoun  Patient came thru ER and had a 1 hour pre-medication and did fine  . Oxycodone Other (See Comments)    Decreased appetite.  Sandrea Hammond [Ibandronic Acid]  Nausea, vomiting, weakness  . Codeine Diarrhea and Nausea And Vomiting  . Darvocet [Propoxyphene N-Acetaminophen] Diarrhea and Nausea And Vomiting  . Erythromycin Nausea And Vomiting    Outpatient Encounter Medications as of 05/22/2019  Medication Sig  . alendronate (FOSAMAX) 70 MG tablet TAKE 1 TAB ONCE A WEEK, AT LEAST 30 MIN BEFORE 1ST FOOD.DO NOT LIE DOWN FOR 30 MIN AFTER TAKING.  Marland Kitchen amoxicillin (AMOXIL) 500 MG capsule Take 2,000 mg by mouth See admin instructions. Take 4 tablet before dental appointment.  Marland Kitchen aspirin 81 MG chewable tablet Chew 1  tablet (81 mg total) by mouth daily.  Marland Kitchen atorvastatin (LIPITOR) 20 MG tablet TAKE 1 TABLET BY MOUTH DAILY. (Patient taking differently: 10 mg. Take half tablet QD)  . Calcium Carb-Cholecalciferol (CALCIUM 600+D3 PO) Take 600 mg by mouth daily.   Marland Kitchen docusate sodium (STOOL SOFTENER) 100 MG capsule Take 100 mg by mouth at bedtime.  Marland Kitchen lisinopril (ZESTRIL) 5 MG tablet TAKE 1 TABLET ONCE DAILY.  . metoprolol tartrate (LOPRESSOR) 25 MG tablet TAKE 1 TABLET BY MOUTH TWICE DAILY.  . NON FORMULARY Hemp lemon salve 500mg  Rub on back for pain  . ondansetron (ZOFRAN) 4 MG tablet Take 1 tablet (4 mg total) by mouth every 8 (eight) hours as needed for nausea or vomiting.  . sertraline (ZOLOFT) 25 MG tablet Take 1 tablet (25 mg total) by mouth daily.  . [DISCONTINUED] ciprofloxacin (CIPRO) 500 MG tablet Take 1 tablet (500 mg total) by mouth 2 (two) times daily.  . [DISCONTINUED] metroNIDAZOLE (FLAGYL) 500 MG tablet Take 1 tablet (500 mg total) by mouth 3 (three) times daily.   No facility-administered encounter medications on file as of 05/22/2019.    Review of Systems:  Review of Systems  Constitutional: Positive for activity change, appetite change and unexpected weight change.  HENT: Negative.   Respiratory: Negative.   Cardiovascular: Negative.   Gastrointestinal: Negative.   Genitourinary: Negative.   Musculoskeletal: Negative.   Neurological: Negative.   Psychiatric/Behavioral: Negative.     Health Maintenance  Topic Date Due  . TETANUS/TDAP  07/28/2019  . INFLUENZA VACCINE  Completed  . DEXA SCAN  Completed  . PNA vac Low Risk Adult  Completed    Physical Exam: Vitals:   05/22/19 1154  BP: 132/80  Pulse: (!) 49 Manually Counted was 60 again  Temp: (!) 97.1 F (36.2 C)  SpO2: 97%  Weight: 136 lb 12.8 oz (62.1 kg)  Height: 5\' 2"  (1.575 m)   Body mass index is 25.02 kg/m. Physical Exam  Constitutional: Oriented to person, place, and time. Well-developed and well-nourished.  HENT:    Head: Normocephalic.  Mouth/Throat: Oropharynx is clear and moist.  Eyes: Pupils are equal, round, and reactive to light.  Neck: Neck supple.  Cardiovascular: Normal rate and normal heart sounds.  No murmur heard. Pulmonary/Chest: Effort normal and breath sounds normal. No respiratory distress. No wheezes. She has no rales.  Abdominal: Soft. Bowel sounds are normal. No distension. There is no tenderness. There is no rebound.  Musculoskeletal: No edema.  Lymphadenopathy: none Neurological: Alert and oriented to person, place, and time.  Skin: Skin is warm and dry.  Psychiatric: Normal mood and affect. Behavior is normal. Thought content normal.    Labs reviewed: Basic Metabolic Panel: Recent Labs    07/10/18 0710 12/02/18 1809 12/02/18 1809 12/02/18 1829 12/30/18 1020 05/12/19 0745  NA  --  140   < > 139 142 139  K  --  3.8   < >  4.2 4.3 3.7  CL  --  103   < > 110 106 102  CO2  --  26  --   --  29 30  GLUCOSE  --  106*   < > 98 93 97  BUN  --  13   < > 17 19 5*  CREATININE  --  0.84   < > 0.70 0.76 1.00*  CALCIUM  --  10.3  --   --  9.9 10.1  TSH 4.19  --   --   --   --   --    < > = values in this interval not displayed.   Liver Function Tests: Recent Labs    12/02/18 1809 12/30/18 1020 05/12/19 0745  AST 31 21  21  42*  42*  ALT 16 14  14 23  23   ALKPHOS 57  --   --   BILITOT 0.9 0.5  0.5 0.7  0.7  PROT 7.8 6.7  6.7 6.9  6.9  ALBUMIN 4.5  --   --    Recent Labs    05/12/19 0745  LIPASE 81*   No results for input(s): AMMONIA in the last 8760 hours. CBC: Recent Labs    07/10/18 0710 07/10/18 0710 12/02/18 1809 12/02/18 1829 05/12/19 0745  WBC 5.8  --  7.5  --  4.9  NEUTROABS 3,248  --  5.0  --   --   HGB 14.2   < > 14.4 15.3* 14.2  HCT 41.7   < > 45.2 45.0 41.6  MCV 91.4  --  98.3  --  91.6  PLT 265  --  273  --  216   < > = values in this interval not displayed.   Lipid Panel: Recent Labs    12/30/18 1020 05/12/19 0745  CHOL 116 84   HDL 44* 41*  LDLCALC 57 29  TRIG 69 67  CHOLHDL 2.6 2.0   Lab Results  Component Value Date   HGBA1C  08/01/2010    5.4 (NOTE)                                                                       According to the ADA Clinical Practice Recommendations for 2011, when HbA1c is used as a screening test:   >=6.5%   Diagnostic of Diabetes Mellitus           (if abnormal result  is confirmed)  5.7-6.4%   Increased risk of developing Diabetes Mellitus  References:Diagnosis and Classification of Diabetes Mellitus,Diabetes Care,2011,34(Suppl 1):S62-S69 and Standards of Medical Care in         Diabetes - 2011,Diabetes Care,2011,34  (Suppl 1):S11-S61.    Procedures since last visit: DG Abd 2 Views  Result Date: 05/04/2019 CLINICAL DATA:  Hyperactive bowel sounds.  Abdominal pain EXAM: ABDOMEN - 2 VIEW COMPARISON:  08/30/2015 FINDINGS: Normal bowel gas pattern.  No obstruction or free air. No urinary tract calculi. Moderate levoscoliosis with multilevel degenerative change in the lumbar spine. IMPRESSION: No acute abnormality.  Lumbar scoliosis and degenerative change. Electronically Signed   By: 07/04/2019 M.D.   On: 05/04/2019 16:27    Assessment/Plan  Acute diverticulitis Pain Better  Advance Diet except Avoid Red Meat  If Loose Stool does not get better to let our Office know  Possible GI consult if Symptoms not better Will repeat Hepatic Panel and Lipase  Cerebrovascular accident (CVA),  BP is controlled On statin and aspirin Holter was negative.  Essential hypertension On low-dose of lisinopril and metoprolol If she continues with bradycardia we will back off on her metoprolol She has been negative for any postural hypotension in the past Osteoporosis without current pathological fracture, unspecified osteoporosis type On Fosamax Last DeXA in 07/2017 Tscore -2.2 Depression with anxiety Patient is doing well on Zoloft Pericardial effusion By cardiology she is asymptomatic and  they have not recommended any intervention Hyperlipidemia, unspecified hyperlipidemia type Her LDL was 29 I have told her to back off on Lipitor to 10 mg  Unsteady gait Continues to be an issue since the stroke.  Continues to have some proximal muscle weakness and tunnel vision Patient has not had any falls  [employer] reviewed Up to date on Vaccinations bs/tests ordered:  * No order type specified * Next appt:  06/30/2019

## 2019-06-04 ENCOUNTER — Other Ambulatory Visit: Payer: Self-pay | Admitting: Nurse Practitioner

## 2019-06-04 DIAGNOSIS — M810 Age-related osteoporosis without current pathological fracture: Secondary | ICD-10-CM

## 2019-06-08 ENCOUNTER — Telehealth: Payer: Self-pay

## 2019-06-08 NOTE — Telephone Encounter (Signed)
Per [name]   "Adriana [name], were her abd pain and diarrhea better when she was on liquid diet? If so, then she should stay on liquid diet, make an referral to GI. She should be seen in our office as needed if no better."  Called patient, confirmed she is not having diarrhea and her pain wasn't necessarily better on the liquid diet or soft diet. Soft diet has gone pretty well according to the patient. This is the first day of increased pain on the soft diet. She does not drive. She is able to get a ride sometimes but she doesn't feel comfortable riding with her friends from the facility and her daughter is an Geophysicist/field seismologist principal with a busy schedule although she could possibly get a ride from her. She will find out if the facility [name] is booked for a trip to [facility] clinic if you prefer to see her there. To [employer]

## 2019-06-08 NOTE — Telephone Encounter (Signed)
Patient called stating she has been having the worst stomach pains thus far. She completed the antibiotics and a liquid diet. She made it back to eating soft foods but has been in so much pain and now wondering should she go back to a liquid diet for what she assumed was diverticulitis. Advised patient Dr. Chales Abrahams is out of office and I will be sending the [facility].

## 2019-06-08 NOTE — Telephone Encounter (Signed)
Patient scheduled for tomorrow at 3p.

## 2019-06-09 ENCOUNTER — Other Ambulatory Visit: Payer: Self-pay

## 2019-06-09 ENCOUNTER — Non-Acute Institutional Stay: Payer: Medicare PPO | Admitting: Nurse Practitioner

## 2019-06-09 ENCOUNTER — Encounter: Payer: Self-pay | Admitting: Nurse Practitioner

## 2019-06-09 DIAGNOSIS — F418 Other specified anxiety disorders: Secondary | ICD-10-CM

## 2019-06-09 DIAGNOSIS — I10 Essential (primary) hypertension: Secondary | ICD-10-CM | POA: Diagnosis not present

## 2019-06-09 DIAGNOSIS — I639 Cerebral infarction, unspecified: Secondary | ICD-10-CM | POA: Diagnosis not present

## 2019-06-09 DIAGNOSIS — K5732 Diverticulitis of large intestine without perforation or abscess without bleeding: Secondary | ICD-10-CM | POA: Diagnosis not present

## 2019-06-09 DIAGNOSIS — E785 Hyperlipidemia, unspecified: Secondary | ICD-10-CM

## 2019-06-09 DIAGNOSIS — K5900 Constipation, unspecified: Secondary | ICD-10-CM | POA: Diagnosis not present

## 2019-06-09 MED ORDER — ATORVASTATIN CALCIUM 10 MG PO TABS: 10.0000 mg | ORAL_TABLET | Freq: Every day | ORAL | 0 refills | Status: DC

## 2019-06-09 NOTE — Telephone Encounter (Signed)
Talked with patient.  She stated she was taking 20 mg, but then Dr. Chales Abrahams changed it to 10 mg and she was just cutting the 20 mg tablet in half. She stated it looked as if she was noncompliant because she wasn't refilling it as often.  She stated she spoke with [facility] today also. I deleted the 20 mg dosing and changed it to 10 mg qd. She states she still has medication for approximately 2 weeks since she was taking 1/2 tablet. New Rx sent to pharmacy as requested.

## 2019-06-09 NOTE — Progress Notes (Signed)
Location:   clinic [facility]   Place of Service:  Clinic (12) Provider: Chipper Oman NP  Code Status: DNR Goals of Care: [place]ives 06/09/2019  Does Patient Have a Medical Advance Directive? Yes  Type of Advance Directive Living will;Healthcare Power of Attorney  Does patient want to make changes to medical advance directive? No - Patient declined  Copy of Healthcare Power of Attorney in Chart? Yes - validated most recent copy scanned in chart (See row information)  Pre-existing out of facility DNR order (yellow form or pink MOST form) -     Chief Complaint  Patient presents with  . Acute Visit    Recurrent abdominal pain, grabbing in nature. Has been on and off clear liquid diet x5 weeks.    HPI: Patient is a 85 y.o. [name] seen today for recurrent abd pain, no diarrhea, better after BM, last abd pain  better after treated for acute diverticulitis with 7 day course of Cipro and Flaygyl 05/08/19, advanced diet from liquid to solid. Hx of similar presentation for acute diverticulitis several times in the past,  may consider GI consult, CT abd,  LFT(AST 42 (10-35)) 4 wks ago,/Lipase 81 4 wks ago(7-60),  Pending TSH BMP LFT Lipase Vit D.   Hx of CVA, on Statin, ASA. HTN, blood pressure is controlled on Lisinopril, Metoprolol. Her mood is stable, on Sertraline.   Past Medical History:  Diagnosis Date  . Anginal pain (HCC) 08/24/2010   Echo-EF =>55%,LV normal  . Arthritis   . Cataract 12/2006   Both eyes  Epes MD  . Chest pain, unspecified 09/04/2007   Lexiscan-- EF 72%; LV normal  . Constipation    Takes flax seed and honey .  Smooth Move tea.  . Depression   . Diverticulitis   . Head injury, closed    with fall  . Hypertension   . Mitral valve prolapse   . Osteopenia   . Stroke [facility])     Past Surgical History:  Procedure Laterality Date  . ANTERIOR CERVICAL DECOMP/DISCECTOMY FUSION N/A 10/23/2012   Procedure: Cervical Five to Cervical Six, Cervical Six to Cervical  Seven anterior cervical decompression with fusion plating and bonegraft;  Surgeon: Hewitt Shorts, MD;  Location: [facility];  Service: Neurosurgery;  Laterality: N/A;  ANTERIOR CERVICAL DECOMPRESSION/DISCECTOMY FUSION 2 LEVELS  . BREAST CYST ASPIRATION  1990   benign  . CARDIAC CATHETERIZATION  08/01/2010   normal coronaries  . COLONOSCOPY  approx 2011  . EYE SURGERY Bilateral 2011  . intestinal blockage surgery  Feb. 2012   "kink in small intestine" per pt    Allergies  Allergen Reactions  . Contrast Media [Iodinated Diagnostic Agents] Hives    Hives during IVP at urology office '02, requires 13 hr prep///a.[email or web address]  Patient came thru ER and had a 1 hour pre-medication and did fine  . Oxycodone Other (See Comments)    Decreased appetite.  Adriana [name] [Ibandronic Acid]     Nausea, vomiting, weakness  . Codeine Diarrhea and Nausea And Vomiting  . Darvocet [Propoxyphene N-Acetaminophen] Diarrhea and Nausea And Vomiting  . Erythromycin Nausea And Vomiting    Allergies as of 06/09/2019      Reactions   Contrast Media [iodinated Diagnostic Agents] Hives   Hives during IVP at urology office '02, requires 13 hr prep///a.[email or web address] Patient came thru ER and had a 1 hour pre-medication and did fine   Oxycodone Other (See Comments)   Decreased appetite.   Boniva [ibandronic  Acid]    Nausea, vomiting, weakness   Codeine Diarrhea, Nausea And Vomiting   Darvocet [propoxyphene N-acetaminophen] Diarrhea, Nausea And Vomiting   Erythromycin Nausea And Vomiting      Medication List       Accurate as of June 09, 2019  4:02 PM. If you have any questions, ask your nurse or doctor.        STOP taking these medications   ondansetron 4 MG tablet Commonly known as: Zofran Stopped by: Kenosha Doster X Jhony Antrim, NP     TAKE these medications   alendronate 70 MG tablet Commonly known as: FOSAMAX TAKE 1 TAB ONCE A WEEK, AT LEAST 30 MIN BEFORE 1ST FOOD.DO NOT LIE DOWN FOR 30 MIN AFTER TAKING.   amoxicillin  500 MG capsule Commonly known as: AMOXIL Take 2,000 mg by mouth See admin instructions. Take 4 tablet before dental appointment.   aspirin 81 MG chewable tablet Chew 1 tablet (81 mg total) by mouth daily.   atorvastatin 10 MG tablet Commonly known as: LIPITOR Take 1 tablet (10 mg total) by mouth daily. What changed: Another medication with the same name was removed. Continue taking this medication, and follow the directions you see here. Changed by: Najai Waszak X Jerome Viglione, NP   CALCIUM 600+D3 PO Take 600 mg by mouth daily.   lisinopril 5 MG tablet Commonly known as: ZESTRIL TAKE 1 TABLET ONCE DAILY.   metoprolol tartrate 25 MG tablet Commonly known as: LOPRESSOR TAKE 1 TABLET BY MOUTH TWICE DAILY.   NON FORMULARY daily as needed. Hemp lemon salve 500mg  Rub on back for pain   sertraline 25 MG tablet Commonly known as: Zoloft Take 1 tablet (25 mg total) by mouth daily.   Stool Softener 100 MG capsule Generic drug: docusate sodium Take 100 mg by mouth at bedtime.       Review of Systems:  Review of Systems  Constitutional: Positive for fatigue. Negative for activity change, appetite change, fever and unexpected weight change.       Sometime feels shaky/weak in general, not focal weakness or tremor.   HENT: Positive for hearing loss. Negative for congestion and voice change.   Eyes: Negative for visual disturbance.  Respiratory: Negative for cough, shortness of breath and wheezing.   Cardiovascular: Negative for chest pain, palpitations and leg swelling.  Gastrointestinal: Positive for abdominal pain. Negative for abdominal distention, constipation, diarrhea, nausea and vomiting.  Genitourinary: Positive for frequency. Negative for difficulty urinating and urgency.       2-3x/night.   Musculoskeletal: Positive for gait problem.  Skin: Negative for color change and pallor.  Neurological: Positive for speech difficulty. Negative for weakness, light-headedness and headaches.        Memory lapses occasionally. Weak up 3x/night, difficulty return to sleep, but better than prior.   Psychiatric/Behavioral: Positive for dysphoric mood. Negative for agitation, behavioral problems, hallucinations and sleep disturbance. The patient is not nervous/anxious.     Health Maintenance  Topic Date Due  . TETANUS/TDAP  07/28/2019  . INFLUENZA VACCINE  10/04/2019  . DEXA SCAN  Completed  . PNA vac Low Risk Adult  Completed    Physical Exam: Vitals:   06/09/19 1510  BP: 128/64  Pulse: (!) 50  Temp: 97.6 F (36.4 C)  TempSrc: Oral  SpO2: 97%  Weight: 132 lb 12.8 oz (60.2 kg)  Height: 5\' 2"  (1.575 m)   Body mass index is 24.29 kg/m. Physical Exam Vitals and nursing note reviewed.  Constitutional:      General:  She is not in acute distress.    Appearance: Normal appearance. She is not ill-appearing.  HENT:     Head: Normocephalic and atraumatic.     Mouth/Throat:     Mouth: Mucous membranes are moist.  Eyes:     Extraocular Movements: Extraocular movements intact.     Conjunctiva/sclera: Conjunctivae normal.     Pupils: Pupils are equal, round, and reactive to light.     Comments: Lateral right eye peripheral vision field loss since CVA 11/2018  Cardiovascular:     Rate and Rhythm: Regular rhythm. Bradycardia present.     Heart sounds: No murmur.  Pulmonary:     Breath sounds: No wheezing or rales.  Abdominal:     General: Bowel sounds are normal. There is no distension.     Palpations: Abdomen is soft.     Tenderness: There is no abdominal tenderness. There is no right CVA tenderness, left CVA tenderness, guarding or rebound.  Musculoskeletal:     Cervical back: Normal range of motion and neck supple.     Right lower leg: No edema.     Left lower leg: No edema.  Skin:    General: Skin is warm and dry.  Neurological:     General: No focal deficit present.     Mental Status: She is alert and oriented to person, place, and time. Mental status is at baseline.      Motor: No weakness.     Coordination: Coordination normal.     Gait: Gait abnormal.     Comments: walker  Psychiatric:        Mood and Affect: Mood normal.        Behavior: Behavior normal.     Labs reviewed: Basic Metabolic Panel: Recent Labs    07/10/18 0710 12/02/18 1809 12/02/18 1809 12/02/18 1829 12/30/18 1020 05/12/19 0745  NA  --  140   < > 139 142 139  K  --  3.8   < > 4.2 4.3 3.7  CL  --  103   < > 110 106 102  CO2  --  26  --   --  29 30  GLUCOSE  --  106*   < > 98 93 97  BUN  --  13   < > 17 19 5*  CREATININE  --  0.84   < > 0.70 0.76 1.00*  CALCIUM  --  10.3  --   --  9.9 10.1  TSH 4.19  --   --   --   --   --    < > = values in this interval not displayed.   Liver Function Tests: Recent Labs    12/02/18 1809 12/30/18 1020 05/12/19 0745  AST 31 21  21  42*  42*  ALT 16 14  14 23  23   ALKPHOS 57  --   --   BILITOT 0.9 0.5  0.5 0.7  0.7  PROT 7.8 6.7  6.7 6.9  6.9  ALBUMIN 4.5  --   --    Recent Labs    05/12/19 0745  LIPASE 81*   No results for input(s): AMMONIA in the last 8760 hours. CBC: Recent Labs    07/10/18 0710 07/10/18 0710 12/02/18 1809 12/02/18 1829 05/12/19 0745  WBC 5.8  --  7.5  --  4.9  NEUTROABS 3,248  --  5.0  --   --   HGB 14.2   < > 14.4 15.3* 14.2  HCT 41.7   < >  45.2 45.0 41.6  MCV 91.4  --  98.3  --  91.6  PLT 265  --  273  --  216   < > = values in this interval not displayed.   Lipid Panel: Recent Labs    12/30/18 1020 05/12/19 0745  CHOL 116 84  HDL 44* 41*  LDLCALC 57 29  TRIG 69 67  CHOLHDL 2.6 2.0   Lab Results  Component Value Date   HGBA1C  08/01/2010    5.4 (NOTE)                                                                       According to the ADA Clinical Practice Recommendations for 2011, when HbA1c is used as a screening test:   >=6.5%   Diagnostic of Diabetes Mellitus           (if abnormal result  is confirmed)  5.7-6.4%   Increased risk of developing Diabetes Mellitus   References:Diagnosis and Classification of Diabetes Mellitus,Diabetes UYQI,3474,25(ZDGLO 1):S62-S69 and Standards of Medical Care in         Diabetes - 2011,Diabetes Care,2011,34  (Suppl 1):S11-S61.    Procedures since last visit: No results found.  Assessment/Plan  Diverticulitis of colon recurrent abd pain, no diarrhea, better after BM today, last episode abd pain got better after treated for acute diverticulitis with 7 day course of Cipro and Flaygyl 05/08/19, may consider GI consult, CT abd,  LFT(AST 42 (10-35)) 4 wks ago,/Lipase 81 4 wks ago(7-60),  Pending TSH BMP LFT Lipase Vit D.  Will repeat Cipro/Flagyl if abd pain recurs, may add Amylase, CBC/diff, CRP, CT abd, [place] abd,  GI referral if abd pain recurs.   Essential hypertension Blood pressure is controlled, HR in 50s, asymptomatic, continue Metoprolol, Lisinopril.   CVA (cerebral vascular accident) ([place]) Continue statin, ASA  Depression with anxiety Feagan standing issue, better, but still wake up 3-4x/night, difficulty return to sleep, may consider increase Sertraline 50mg  qd for mood/sleep.   Constipation Not use suppository during acute diverticulitis flare up.    Labs/tests ordered: none   Next appt:  06/30/2019

## 2019-06-09 NOTE — Assessment & Plan Note (Addendum)
List standing issue, better, but still wake up 3-4x/night, difficulty return to sleep, may consider increase Sertraline 50mg  qd for mood/sleep.

## 2019-06-09 NOTE — Patient Instructions (Signed)
The patient is doing better after BM today, no change in treatment.

## 2019-06-09 NOTE — Assessment & Plan Note (Signed)
Not use suppository during acute diverticulitis flare up.

## 2019-06-09 NOTE — Telephone Encounter (Signed)
Only gave 30 tablets with 0 refills.  Patient has repeat labs, as well as a followup later this month with Dr. Chales Abrahams.

## 2019-06-09 NOTE — Assessment & Plan Note (Signed)
Continue statin, ASA 

## 2019-06-09 NOTE — Telephone Encounter (Signed)
Patient will see ManXie today at 3:00 pm  A new rx needs to be submitted for Lipitor. Incoming fax received from [facility] stating patient says she is taking Lipitor 10 mg although last rx states 20 mg   Please clarify with patient and send new rx to pharmacy

## 2019-06-09 NOTE — Assessment & Plan Note (Addendum)
recurrent abd pain, no diarrhea, better after BM today, last episode abd pain got better after treated for acute diverticulitis with 7 day course of Cipro and Flaygyl 05/08/19, may consider GI consult, CT abd,  LFT(AST 42 (10-35)) 4 wks ago,/Lipase 81 4 wks ago(7-60),  Pending TSH BMP LFT Lipase Vit D.  Will repeat Cipro/Flagyl if abd pain recurs, may add Amylase, CBC/diff, CRP, CT abd, [place] abd,  GI referral if abd pain recurs.

## 2019-06-09 NOTE — Assessment & Plan Note (Addendum)
Blood pressure is controlled, HR in 50s, asymptomatic, continue Metoprolol, Lisinopril.

## 2019-06-25 ENCOUNTER — Other Ambulatory Visit: Payer: Self-pay

## 2019-06-25 ENCOUNTER — Non-Acute Institutional Stay: Payer: Medicare PPO | Admitting: Nurse Practitioner

## 2019-06-25 ENCOUNTER — Encounter: Payer: Self-pay | Admitting: Nurse Practitioner

## 2019-06-25 DIAGNOSIS — K5900 Constipation, unspecified: Secondary | ICD-10-CM | POA: Diagnosis not present

## 2019-06-25 DIAGNOSIS — R1033 Periumbilical pain: Secondary | ICD-10-CM

## 2019-06-25 DIAGNOSIS — R109 Unspecified abdominal pain: Secondary | ICD-10-CM | POA: Insufficient documentation

## 2019-06-25 DIAGNOSIS — F418 Other specified anxiety disorders: Secondary | ICD-10-CM | POA: Diagnosis not present

## 2019-06-25 DIAGNOSIS — I10 Essential (primary) hypertension: Secondary | ICD-10-CM

## 2019-06-25 NOTE — Progress Notes (Signed)
Location:   clinic [place]   Place of Service:  Clinic (12) Provider: Marlana Latus NP  Code Status: DNR Goals of Care: [place] Directives 06/09/2019  Does Patient Have a Medical Advance Directive? Yes  Type of Advance Directive Living will;Healthcare Power of Attorney  Does patient want to make changes to medical advance directive? No - Patient declined  Copy of [place] in Chart? Yes - validated most recent copy scanned in chart (See row information)  Pre-existing out of facility DNR order (yellow form or pink MOST form) -     Chief Complaint  Patient presents with  . Acute Visit    on and off abd pain since 04/2019    HPI: Patient is a 85 y.o. female seen today for an acute visit for persisted abd pain, bloated, better after BM, but doesn't always able to have BM, onset 04/26/19, treated for clinically for diverticulitis 05/08/19 with 7 day course of Cipro/Flagyl, wax and wane.  04/16/19 started Sertraline 25mg  qd for early am awake, fatigue, escalated worries, improves all above stated symptoms. The patient stated depression medicine caused her constipation in the past, but declined.  stop Sertraline today.   Hx of constipation, s/p GI Dr. Collene Mares evaluation, Colace qd. HTN, blood pressure is controlled on Metoprolol 25mg  bid, Lisinopril 5mg  qd.   Past Medical History:  Diagnosis Date  . Anginal pain ([place]) 08/24/2010   Echo-EF =>55%,LV normal  . Arthritis   . Cataract 12/2006   Both eyes  Epes MD  . Chest pain, unspecified 09/04/2007   Lexiscan-- EF 72%; LV normal  . Constipation    Takes flax seed and honey .  Smooth Move tea.  . Depression   . Diverticulitis   . Head injury, closed    with fall  . Hypertension   . Mitral valve prolapse   . Osteopenia   . Stroke [facility])     Past Surgical History:  Procedure Laterality Date  . ANTERIOR CERVICAL DECOMP/DISCECTOMY FUSION N/A 10/23/2012   Procedure: Cervical Five to Cervical Six, Cervical Six to Cervical  Seven anterior cervical decompression with fusion plating and bonegraft;  Surgeon: Hosie Spangle, MD;  Location: [facility];  Service: Neurosurgery;  Laterality: N/A;  ANTERIOR CERVICAL DECOMPRESSION/DISCECTOMY FUSION 2 LEVELS  . BREAST CYST ASPIRATION  1990   benign  . CARDIAC CATHETERIZATION  08/01/2010   normal coronaries  . COLONOSCOPY  approx 2011  . EYE SURGERY Bilateral 2011  . intestinal blockage surgery  Feb. 2012   "kink in small intestine" per pt    Allergies  Allergen Reactions  . Contrast Media [Iodinated Diagnostic Agents] Hives    Hives during IVP at urology office '02, requires 13 hr prep///a.[email or web address]  Patient came thru ER and had a 1 hour pre-medication and did fine  . Oxycodone Other (See Comments)    Decreased appetite.  Jaclyn Prime [Ibandronic Acid]     Nausea, vomiting, weakness  . Codeine Diarrhea and Nausea And Vomiting  . Darvocet [Propoxyphene N-Acetaminophen] Diarrhea and Nausea And Vomiting  . Erythromycin Nausea And Vomiting    Allergies as of 06/25/2019      Reactions   Contrast Media [iodinated Diagnostic Agents] Hives   Hives during IVP at urology office '02, requires 13 hr prep///a.[email or web address] Patient came thru ER and had a 1 hour pre-medication and did fine   Oxycodone Other (See Comments)   Decreased appetite.   Boniva [ibandronic Acid]    Nausea, vomiting, weakness  Codeine Diarrhea, Nausea And Vomiting   Darvocet [propoxyphene N-acetaminophen] Diarrhea, Nausea And Vomiting   Erythromycin Nausea And Vomiting      Medication List       Accurate as of June 25, 2019 11:59 PM. If you have any questions, ask your nurse or doctor.        alendronate 70 MG tablet Commonly known as: FOSAMAX TAKE 1 TAB ONCE A WEEK, AT LEAST 30 MIN BEFORE 1ST FOOD.DO NOT LIE DOWN FOR 30 MIN AFTER TAKING.   amoxicillin 500 MG capsule Commonly known as: AMOXIL Take 2,000 mg by mouth See admin instructions. Take 4 tablet before dental appointment.   aspirin  81 MG chewable tablet Chew 1 tablet (81 mg total) by mouth daily.   atorvastatin 10 MG tablet Commonly known as: LIPITOR Take 1 tablet (10 mg total) by mouth daily.   CALCIUM 600+D3 PO Take 600 mg by mouth daily.   lisinopril 5 MG tablet Commonly known as: ZESTRIL TAKE 1 TABLET ONCE DAILY.   metoprolol tartrate 25 MG tablet Commonly known as: LOPRESSOR TAKE 1 TABLET BY MOUTH TWICE DAILY.   NON FORMULARY daily as needed. Hemp lemon salve 500mg  Rub on back for pain   sertraline 25 MG tablet Commonly known as: Zoloft Take 1 tablet (25 mg total) by mouth daily.   Stool Softener 100 MG capsule Generic drug: docusate sodium Take 100 mg by mouth at bedtime.       Review of Systems:  Review of Systems  Constitutional: Negative for activity change, appetite change, fatigue and fever.       Sometime feels shaky/weak in general, not focal weakness or tremor.   HENT: Positive for hearing loss. Negative for congestion and voice change.   Eyes: Negative for visual disturbance.  Respiratory: Negative for cough, shortness of breath and wheezing.   Cardiovascular: Negative for chest pain, palpitations and leg swelling.  Gastrointestinal: Positive for abdominal pain. Negative for abdominal distention, constipation, diarrhea, nausea and vomiting.       Bloated and pain in umbilical area, better after BM  Genitourinary: Negative for difficulty urinating, frequency and urgency.  Musculoskeletal: Positive for gait problem.  Skin: Negative for color change.  Neurological: Negative for dizziness, speech difficulty and weakness.       Memory lapses occasionally.   Psychiatric/Behavioral: Negative for agitation, behavioral problems and sleep disturbance.    Health Maintenance  Topic Date Due  . TETANUS/TDAP  07/28/2019  . INFLUENZA VACCINE  10/04/2019  . DEXA SCAN  Completed  . COVID-19 Vaccine  Completed  . PNA vac Low Risk Adult  Completed    Physical Exam: Vitals:   06/25/19  1410  BP: 122/80  Pulse: (!) 52  Temp: (!) 96.8 F (36 C)  SpO2: 99%  Weight: 130 lb 3.2 oz (59.1 kg)  Height: 5\' 2"  (1.575 m)   Body mass index is 23.81 kg/m. Physical Exam Vitals and nursing note reviewed.  Constitutional:      General: She is not in acute distress.    Appearance: Normal appearance. She is not ill-appearing.  HENT:     Head: Normocephalic and atraumatic.     Mouth/Throat:     Mouth: Mucous membranes are moist.  Eyes:     Extraocular Movements: Extraocular movements intact.     Conjunctiva/sclera: Conjunctivae normal.     Pupils: Pupils are equal, round, and reactive to light.     Comments: Lateral right eye peripheral vision field loss since CVA 11/2018  Cardiovascular:  Rate and Rhythm: Regular rhythm. Bradycardia present.     Heart sounds: No murmur.  Pulmonary:     Breath sounds: No wheezing or rales.  Abdominal:     General: Bowel sounds are normal. There is no distension.     Palpations: Abdomen is soft.     Tenderness: There is no abdominal tenderness. There is no right CVA tenderness, left CVA tenderness, guarding or rebound.  Musculoskeletal:     Cervical back: Normal range of motion and neck supple.     Right lower leg: No edema.     Left lower leg: No edema.  Skin:    General: Skin is warm and dry.  Neurological:     General: No focal deficit present.     Mental Status: She is alert and oriented to person, place, and time. Mental status is at baseline.     Motor: No weakness.     Coordination: Coordination normal.     Gait: Gait abnormal.     Comments: walker  Psychiatric:        Mood and Affect: Mood normal.        Behavior: Behavior normal.     Labs reviewed: Basic Metabolic Panel: Recent Labs    07/10/18 0710 12/02/18 1809 12/02/18 1809 12/02/18 1829 12/30/18 1020 05/12/19 0745  NA  --  140   < > 139 142 139  K  --  3.8   < > 4.2 4.3 3.7  CL  --  103   < > 110 106 102  CO2  --  26  --   --  29 30  GLUCOSE  --  106*    < > 98 93 97  BUN  --  13   < > 17 19 5*  CREATININE  --  0.84   < > 0.70 0.76 1.00*  CALCIUM  --  10.3  --   --  9.9 10.1  TSH 4.19  --   --   --   --   --    < > = values in this interval not displayed.   Liver Function Tests: Recent Labs    12/02/18 1809 12/30/18 1020 05/12/19 0745  AST 31 21  21  42*  42*  ALT 16 14  14 23  23   ALKPHOS 57  --   --   BILITOT 0.9 0.5  0.5 0.7  0.7  PROT 7.8 6.7  6.7 6.9  6.9  ALBUMIN 4.5  --   --    Recent Labs    05/12/19 0745  LIPASE 81*   No results for input(s): AMMONIA in the last 8760 hours. CBC: Recent Labs    07/10/18 0710 07/10/18 0710 12/02/18 1809 12/02/18 1829 05/12/19 0745  WBC 5.8  --  7.5  --  4.9  NEUTROABS 3,248  --  5.0  --   --   HGB 14.2   < > 14.4 15.3* 14.2  HCT 41.7   < > 45.2 45.0 41.6  MCV 91.4  --  98.3  --  91.6  PLT 265  --  273  --  216   < > = values in this interval not displayed.   Lipid Panel: Recent Labs    12/30/18 1020 05/12/19 0745  CHOL 116 84  HDL 44* 41*  LDLCALC 57 29  TRIG 69 67  CHOLHDL 2.6 2.0   Lab Results  Component Value Date   HGBA1C  08/01/2010    5.4 (NOTE)  According to the ADA Clinical Practice Recommendations for 2011, when HbA1c is used as a screening test:   >=6.5%   Diagnostic of Diabetes Mellitus           (if abnormal result  is confirmed)  5.7-6.4%   Increased risk of developing Diabetes Mellitus  References:Diagnosis and Classification of Diabetes Mellitus,Diabetes Care,2011,34(Suppl 1):S62-S69 and Standards of Medical Care in         Diabetes - 2011,Diabetes Care,2011,34  (Suppl 1):S11-S61.    Procedures since last visit: No results found.  Assessment/Plan Abdominal pain Pain and bloated, usually better after BM, but not always able to have BM, denied fever, nausea, or constipation, denied dysuria, or CVA tenderness. onset 04/26/19, treated for clinically for diverticulitis 05/08/19  with 7 day course of Cipro/Flagyl, wax and wane. The patient declined hold/stop taking Sertraline to eliminate possible cause of abd pain/bloated abd which started 10 days prior to the onset of above stated GI symptoms.  GI ASAP with Dr. Loreta Ave 05/12/19 lipase 81(7-60), pending repeat lipase.   Depression with anxiety 04/16/19 started Sertraline 25mg  qd for early am awake, fatigue, escalated worries, improves all above stated symptoms. The patient stated depression medicine caused her constipation in the past, but declined.   Essential hypertension Blood pressure is controlled, continue Metoprolol, Lisinopril.   Constipation The patient stated her constipation is stable on Colace. She took Liness in the past which caused her explosive BM while she was taking depression medication about a year and half ago. The patient declined hold or stop taking Sertraline to eliminate possible cause of her GI symptoms today.     Labs/tests ordered:  None  Next appt:  06/30/2019

## 2019-06-25 NOTE — Assessment & Plan Note (Addendum)
The patient stated her constipation is stable on Colace. She took Liness in the past which caused her explosive BM while she was taking depression medication about a year and half ago. The patient declined hold or stop taking Sertraline to eliminate possible cause of her GI symptoms today.

## 2019-06-25 NOTE — Assessment & Plan Note (Signed)
Blood pressure is controlled, continue Metoprolol, Lisinopril.  

## 2019-06-25 NOTE — Assessment & Plan Note (Signed)
04/16/19 started Sertraline 25mg  qd for early am awake, fatigue, escalated worries, improves all above stated symptoms. The patient stated depression medicine caused her constipation in the past, but declined.

## 2019-06-25 NOTE — Patient Instructions (Signed)
F/u GI Dr. Loreta Ave ASAP

## 2019-06-25 NOTE — Assessment & Plan Note (Addendum)
Pain and bloated, usually better after BM, but not always able to have BM, denied fever, nausea, or constipation, denied dysuria, or CVA tenderness. onset 04/26/19, treated for clinically for diverticulitis 05/08/19 with 7 day course of Cipro/Flagyl, wax and wane. The patient declined hold/stop taking Sertraline to eliminate possible cause of abd pain/bloated abd which started 10 days prior to the onset of above stated GI symptoms.  GI ASAP with Dr. Loreta Ave 05/12/19 lipase 81(7-60), pending repeat lipase.

## 2019-06-26 ENCOUNTER — Encounter: Payer: Self-pay | Admitting: Nurse Practitioner

## 2019-06-30 ENCOUNTER — Other Ambulatory Visit: Payer: Self-pay

## 2019-06-30 DIAGNOSIS — M810 Age-related osteoporosis without current pathological fracture: Secondary | ICD-10-CM

## 2019-06-30 DIAGNOSIS — E785 Hyperlipidemia, unspecified: Secondary | ICD-10-CM | POA: Diagnosis not present

## 2019-06-30 DIAGNOSIS — K5792 Diverticulitis of intestine, part unspecified, without perforation or abscess without bleeding: Secondary | ICD-10-CM | POA: Diagnosis not present

## 2019-07-01 LAB — BASIC METABOLIC PANEL
BUN: 11 mg/dL (ref 7–25)
CO2: 29 mmol/L (ref 20–32)
Calcium: 9.9 mg/dL (ref 8.6–10.4)
Chloride: 106 mmol/L (ref 98–110)
Creat: 0.72 mg/dL (ref 0.60–0.88)
Glucose, Bld: 94 mg/dL (ref 65–99)
Potassium: 3.7 mmol/L (ref 3.5–5.3)
Sodium: 141 mmol/L (ref 135–146)

## 2019-07-01 LAB — LIPID PANEL
Cholesterol: 129 mg/dL (ref <200)
HDL: 46 mg/dL — ABNORMAL LOW (ref >=50)
LDL Cholesterol (Calc): 69 mg/dL (calc)
Non-HDL Cholesterol (Calc): 83 mg/dL (calc) (ref <130)
Total CHOL/HDL Ratio: 2.8 (calc) (ref <5.0)
Triglycerides: 65 mg/dL (ref <150)

## 2019-07-01 LAB — HEPATIC FUNCTION PANEL
AG Ratio: 1.6 (calc) (ref 1.0–2.5)
ALT: 12 U/L (ref 6–29)
AST: 20 U/L (ref 10–35)
Albumin: 4.1 g/dL (ref 3.6–5.1)
Alkaline phosphatase (APISO): 53 U/L (ref 37–153)
Bilirubin, Direct: 0.1 mg/dL (ref 0.0–0.2)
Globulin: 2.5 g/dL (calc) (ref 1.9–3.7)
Indirect Bilirubin: 0.3 mg/dL (calc) (ref 0.2–1.2)
Total Bilirubin: 0.4 mg/dL (ref 0.2–1.2)
Total Protein: 6.6 g/dL (ref 6.1–8.1)

## 2019-07-01 LAB — VITAMIN D 25 HYDROXY (VIT D DEFICIENCY, FRACTURES): Vit D, 25-Hydroxy: 37 ng/mL (ref 30–100)

## 2019-07-01 LAB — TSH: TSH: 4.05 mIU/L (ref 0.40–4.50)

## 2019-07-01 LAB — LIPASE: Lipase: 25 U/L (ref 7–60)

## 2019-07-03 ENCOUNTER — Encounter: Payer: Self-pay | Admitting: Internal Medicine

## 2019-07-03 ENCOUNTER — Non-Acute Institutional Stay: Payer: Medicare PPO | Admitting: Internal Medicine

## 2019-07-03 ENCOUNTER — Other Ambulatory Visit: Payer: Self-pay

## 2019-07-03 VITALS — BP 126/80 | HR 54 | Temp 96.9°F | Ht 62.0 in | Wt 135.4 lb

## 2019-07-03 DIAGNOSIS — M810 Age-related osteoporosis without current pathological fracture: Secondary | ICD-10-CM | POA: Diagnosis not present

## 2019-07-03 DIAGNOSIS — R1033 Periumbilical pain: Secondary | ICD-10-CM

## 2019-07-03 DIAGNOSIS — I3139 Other pericardial effusion (noninflammatory): Secondary | ICD-10-CM

## 2019-07-03 DIAGNOSIS — F418 Other specified anxiety disorders: Secondary | ICD-10-CM

## 2019-07-03 DIAGNOSIS — I313 Pericardial effusion (noninflammatory): Secondary | ICD-10-CM

## 2019-07-03 DIAGNOSIS — E785 Hyperlipidemia, unspecified: Secondary | ICD-10-CM

## 2019-07-03 DIAGNOSIS — I10 Essential (primary) hypertension: Secondary | ICD-10-CM

## 2019-07-04 NOTE — Progress Notes (Signed)
Location:  [facility] [employer] needs teacher of Service:  Clinic (12)  Provider:   Code Status:  Goals of Care:  Advanced Directives 07/03/2019  Does Patient Have a Medical Advance Directive? Yes  Type of Estate agent of [place];Living will  Does patient want to make changes to medical advance directive? No - Patient declined  Copy of Healthcare Power of Attorney in Chart? Yes - validated most recent copy scanned in chart (See row information)  Pre-existing out of facility DNR order (yellow form or pink MOST form) -     Chief Complaint  Patient presents with  . Medical Management of Chronic Issues    Follow up    HPI: Patient is a 85 y.o. female seen today for medical management of chronic diseases.    Patient has h/oposterior CVA, pericardial effusion, depression, diverticulosis  Active issues Abdominal Pain Was treated for acute diverticulitis with Cipro and Flagyl.  Her pain did improve but she continues to have a lingering pain now almost around her periumbilical area.  She continues to watch her diet and is scared to eat because she thinks that will make her pain worse. Also complaining of alternate constipation and diarrhea. Does have appointment with Dr. Loreta Ave in 2 weeks Acute posterior CVA Patient had acute stroke in 9/20.It was Small Subacute Left Occipital Cortex with Severe Narrowing in P3-4 Has done well but continues to have problems with her vision Moderate pericardial effusion Was found on her echo Patient continues to be completely asymptomatic Was seen by cardiology and they repeated the echo Since the fluid was stable nothing to be done unless patient becomes symptomatic  Past Medical History:  Diagnosis Date  . Anginal pain ([facility]) 08/24/2010   Echo-EF =>55%,LV normal  . Arthritis   . Cataract 12/2006   Both eyes  Epes MD  . Chest pain, unspecified 09/04/2007   Lexiscan-- EF 72%; LV normal  . Constipation    Takes flax seed and  honey .  Smooth Move tea.  . Depression   . Diverticulitis   . Head injury, closed    with fall  . Hypertension   . Mitral valve prolapse   . Osteopenia   . Stroke [facility])     Past Surgical History:  Procedure Laterality Date  . ANTERIOR CERVICAL DECOMP/DISCECTOMY FUSION N/A 10/23/2012   Procedure: Cervical Five to Cervical Six, Cervical Six to Cervical Seven anterior cervical decompression with fusion plating and bonegraft;  Surgeon: Hewitt Shorts, MD;  Location: [facility];  Service: Neurosurgery;  Laterality: N/A;  ANTERIOR CERVICAL DECOMPRESSION/DISCECTOMY FUSION 2 LEVELS  . BREAST CYST ASPIRATION  1990   benign  . CARDIAC CATHETERIZATION  08/01/2010   normal coronaries  . COLONOSCOPY  approx 2011  . EYE SURGERY Bilateral 2011  . intestinal blockage surgery  Feb. 2012   "kink in small intestine" per pt    Allergies  Allergen Reactions  . Contrast Media [Iodinated Diagnostic Agents] Hives    Hives during IVP at urology office '02, requires 13 hr prep///a.[email or web address]  Patient came thru ER and had a 1 hour pre-medication and did fine  . Oxycodone Other (See Comments)    Decreased appetite.  Sandrea Hammond [Ibandronic Acid]     Nausea, vomiting, weakness  . Codeine Diarrhea and Nausea And Vomiting  . Darvocet [Propoxyphene N-Acetaminophen] Diarrhea and Nausea And Vomiting  . Erythromycin Nausea And Vomiting    Outpatient Encounter Medications as of 07/03/2019  Medication Sig  .  alendronate (FOSAMAX) 70 MG tablet TAKE 1 TAB ONCE A WEEK, AT LEAST 30 MIN BEFORE 1ST FOOD.DO NOT LIE DOWN FOR 30 MIN AFTER TAKING.  Marland Kitchen amoxicillin (AMOXIL) 500 MG capsule Take 2,000 mg by mouth See admin instructions. Take 4 tablet before dental appointment.  Marland Kitchen aspirin 81 MG chewable tablet Chew 1 tablet (81 mg total) by mouth daily.  Marland Kitchen atorvastatin (LIPITOR) 10 MG tablet Take 1 tablet (10 mg total) by mouth daily.  . Calcium Carb-Cholecalciferol (CALCIUM 600+D3 PO) Take 600 mg by mouth daily.   Marland Kitchen  docusate sodium (STOOL SOFTENER) 100 MG capsule Take 100 mg by mouth at bedtime.  Marland Kitchen lisinopril (ZESTRIL) 5 MG tablet TAKE 1 TABLET ONCE DAILY.  . metoprolol tartrate (LOPRESSOR) 25 MG tablet TAKE 1 TABLET BY MOUTH TWICE DAILY.  . NON FORMULARY daily as needed. Hemp lemon salve 500mg  Rub on back for pain   . sertraline (ZOLOFT) 25 MG tablet Take 1 tablet (25 mg total) by mouth daily.   No facility-administered encounter medications on file as of 07/03/2019.    Review of Systems:  Review of Systems  Constitutional: Positive for appetite change.  HENT: Negative.   Respiratory: Negative.   Cardiovascular: Negative.   Gastrointestinal: Positive for abdominal distention, abdominal pain, constipation and diarrhea. Negative for nausea and vomiting.  Genitourinary: Negative.   Musculoskeletal: Positive for gait problem.  Skin: Negative.   Neurological: Positive for weakness.  Psychiatric/Behavioral: Negative.     Health Maintenance  Topic Date Due  . TETANUS/TDAP  07/28/2019  . INFLUENZA VACCINE  10/04/2019  . DEXA SCAN  Completed  . COVID-19 Vaccine  Completed  . PNA vac Low Risk Adult  Completed    Physical Exam: Vitals:   07/03/19 1102  BP: 126/80  Pulse: (!) 54  Temp: (!) 96.9 F (36.1 C)  SpO2: 98%  Weight: 135 lb 6.4 oz (61.4 kg)  Height: 5\' 2"  (1.575 m)   Body mass index is 24.76 kg/m. Physical Exam  Constitutional: Oriented to person, place, and time. Well-developed and well-nourished.  HENT:  Head: Normocephalic.  Mouth/Throat: Oropharynx is clear and moist.  Eyes: Pupils are equal, round, and reactive to light.  Neck: Neck supple.  Cardiovascular: Normal rate and normal heart sounds.  No murmur heard. Pulmonary/Chest: Effort normal and breath sounds normal. No respiratory distress. No wheezes. She has no rales.  Abdominal: Soft. Bowel sounds are normal. No distension.  C/O Pain in her Periumbilical Region  Musculoskeletal: No edema.  Lymphadenopathy:  none Neurological: Alert and oriented to person, place, and time.  Skin: Skin is warm and dry.  Psychiatric: Normal mood and affect. Behavior is normal. Thought content normal.    Labs reviewed: Basic Metabolic Panel: Recent Labs    07/10/18 0710 12/02/18 1809 12/30/18 1020 05/12/19 0745 06/30/19 0715  NA  --    < > 142 139 141  K  --    < > 4.3 3.7 3.7  CL  --    < > 106 102 106  CO2  --    < > 29 30 29   GLUCOSE  --    < > 93 97 94  BUN  --    < > 19 5* 11  CREATININE  --    < > 0.76 1.00* 0.72  CALCIUM  --    < > 9.9 10.1 9.9  TSH 4.19  --   --   --  4.05   < > = values in this interval not displayed.  Liver Function Tests: Recent Labs    12/02/18 1809 12/02/18 1809 12/30/18 1020 05/12/19 0745 06/30/19 0715  AST 31   < > 21  21 42*  42* 20  ALT 16   < > 14  14 23  23 12   ALKPHOS 57  --   --   --   --   BILITOT 0.9   < > 0.5  0.5 0.7  0.7 0.4  PROT 7.8   < > 6.7  6.7 6.9  6.9 6.6  ALBUMIN 4.5  --   --   --   --    < > = values in this interval not displayed.   Recent Labs    05/12/19 0745 06/30/19 0715  LIPASE 81* 25   No results for input(s): AMMONIA in the last 8760 hours. CBC: Recent Labs    07/10/18 0710 07/10/18 0710 12/02/18 1809 12/02/18 1829 05/12/19 0745  WBC 5.8  --  7.5  --  4.9  NEUTROABS 3,248  --  5.0  --   --   HGB 14.2   < > 14.4 15.3* 14.2  HCT 41.7   < > 45.2 45.0 41.6  MCV 91.4  --  98.3  --  91.6  PLT 265  --  273  --  216   < > = values in this interval not displayed.   Lipid Panel: Recent Labs    12/30/18 1020 05/12/19 0745 06/30/19 0715  CHOL 116 84 129  HDL 44* 41* 46*  LDLCALC 57 29 69  TRIG 69 67 65  CHOLHDL 2.6 2.0 2.8   Lab Results  Component Value Date   HGBA1C  08/01/2010    5.4 (NOTE)                                                                       According to the ADA Clinical Practice Recommendations for 2011, when HbA1c is used as a screening test:   >=6.5%   Diagnostic of Diabetes  [place]           (if abnormal result  is confirmed)  5.7-6.4%   Increased risk of developing Diabetes Mellitus  References:Diagnosis and Classification of Diabetes Mellitus,Diabetes Care,2011,34(Suppl 1):S62-S69 and Standards of Medical Care in         Diabetes - 2011,Diabetes Care,2011,34  (Suppl 1):S11-S61.    Procedures since last visit: No results found.  Assessment/Plan  Periumbilical abdominal pain Colace QHS Continue Probiotic Increased Diet  Follow up with GI  Depression with anxiety On Zoloft Doing well  Essential hypertension On Lisinopril and Metoprolol Osteoporosis without current pathological fracture, unspecified osteoporosis type Continue Fosamax Last DeXA in 07/2017 Tscore -2.2 Due next visit Pericardial effusion By cardiology she is asymptomatic and they have not recommended any intervention Hyperlipidemia, unspecified hyperlipidemia type LDL at target on Lipitor  Unsteady gait Continues to be an issue since the stroke. Continues to have some proximal muscle weakness and tunnel vision Patient has not had any falls She is going to call her Ophthalmologist for Vision Issues Up to date on Vaccinations  Labs/tests ordered:  * No order type specified * Next appt:  10/09/2019  Total time spent in this patient care encounter was 45 _  minutes;  greater than 50% of the visit spent counseling patient and staff, reviewing records , Labs and coordinating care for problems addressed at this encounter.

## 2019-07-07 ENCOUNTER — Other Ambulatory Visit: Payer: Self-pay | Admitting: Nurse Practitioner

## 2019-07-07 DIAGNOSIS — M810 Age-related osteoporosis without current pathological fracture: Secondary | ICD-10-CM

## 2019-07-07 NOTE — Telephone Encounter (Signed)
High risk warning populated and sent to provider for approval.   

## 2019-07-13 ENCOUNTER — Other Ambulatory Visit: Payer: Self-pay | Admitting: Nurse Practitioner

## 2019-07-14 DIAGNOSIS — Z86010 Personal history of colonic polyps: Secondary | ICD-10-CM | POA: Diagnosis not present

## 2019-07-14 DIAGNOSIS — K5900 Constipation, unspecified: Secondary | ICD-10-CM | POA: Diagnosis not present

## 2019-07-14 DIAGNOSIS — K5730 Diverticulosis of large intestine without perforation or abscess without bleeding: Secondary | ICD-10-CM | POA: Diagnosis not present

## 2019-07-14 DIAGNOSIS — R1032 Left lower quadrant pain: Secondary | ICD-10-CM | POA: Diagnosis not present

## 2019-07-21 ENCOUNTER — Other Ambulatory Visit: Payer: Self-pay | Admitting: Internal Medicine

## 2019-07-21 DIAGNOSIS — E785 Hyperlipidemia, unspecified: Secondary | ICD-10-CM

## 2019-07-27 ENCOUNTER — Other Ambulatory Visit: Payer: Self-pay | Admitting: Internal Medicine

## 2019-07-27 DIAGNOSIS — E785 Hyperlipidemia, unspecified: Secondary | ICD-10-CM

## 2019-07-28 ENCOUNTER — Other Ambulatory Visit: Payer: Self-pay | Admitting: Internal Medicine

## 2019-07-28 DIAGNOSIS — E785 Hyperlipidemia, unspecified: Secondary | ICD-10-CM

## 2019-07-30 ENCOUNTER — Encounter: Payer: Self-pay | Admitting: Nurse Practitioner

## 2019-07-30 ENCOUNTER — Other Ambulatory Visit: Payer: Self-pay

## 2019-07-30 ENCOUNTER — Non-Acute Institutional Stay: Payer: Medicare PPO | Admitting: Nurse Practitioner

## 2019-07-30 VITALS — BP 136/88 | HR 54 | Temp 96.0°F | Ht 62.0 in | Wt 138.6 lb

## 2019-07-30 DIAGNOSIS — H6122 Impacted cerumen, left ear: Secondary | ICD-10-CM | POA: Diagnosis not present

## 2019-07-30 DIAGNOSIS — M159 Polyosteoarthritis, unspecified: Secondary | ICD-10-CM

## 2019-07-30 DIAGNOSIS — M810 Age-related osteoporosis without current pathological fracture: Secondary | ICD-10-CM | POA: Diagnosis not present

## 2019-07-30 DIAGNOSIS — I10 Essential (primary) hypertension: Secondary | ICD-10-CM | POA: Diagnosis not present

## 2019-07-30 DIAGNOSIS — F418 Other specified anxiety disorders: Secondary | ICD-10-CM | POA: Diagnosis not present

## 2019-07-30 MED ORDER — SERTRALINE HCL 50 MG PO TABS: 50.0000 mg | ORAL_TABLET | Freq: Every day | ORAL | 3 refills | Status: DC

## 2019-07-30 NOTE — Assessment & Plan Note (Signed)
Will increase Sertraline to 50mg  qd

## 2019-07-30 NOTE — Progress Notes (Addendum)
Location:   clinic [facility]   Place of Service:  Clinic (12) Provider: Chipper Oman NP  Code Status: DNR Goals of Care: [place] 07/03/2019  Does Patient Have a Medical Advance Directive? Yes  Type of Estate agent of [place];Living will  Does patient want to make changes to medical advance directive? No - Patient declined  Copy of Healthcare Power of Attorney in Chart? Yes - validated most recent copy scanned in chart (See row information)  Pre-existing out of facility DNR order (yellow form or pink MOST form) -     Chief Complaint  Patient presents with  . Medical Management of Chronic Issues    Patient returns  to the clinic to discuss her right side sharp pains that she has had for a couple of weeks now. She would also like to discuss her hearing loss in her left ear and depression.    HPI: Patient is a 85 y.o. female seen today for medical management of chronic diseases.    HOH L ear impacted cerumen.   Right sided pain, under breat, better now, denied nausea, vomiting, negative Murphy's sign, denied change of hack cough, denied sputum production or SOB, abd X-ray 05/04/19 L spine DDD  HR 54, asymptomatic.   HTN, blood pressure is controlled, on Lisinopril 5mg  qd, Metoprolol 25mg  bid, Atorvastatin, ASA.  OP, Alendronate. Ca  Depression, feels depressed after initial improvement, Sertraline 25mg  qd.   Past Medical History:  Diagnosis Date  . Anginal pain (HCC) 08/24/2010   Echo-EF =>55%,LV normal  . Arthritis   . Cataract 12/2006   Both eyes  Epes MD  . Chest pain, unspecified 09/04/2007   Lexiscan-- EF 72%; LV normal  . Constipation    Takes flax seed and honey .  Smooth Move tea.  . Depression   . Diverticulitis   . Head injury, closed    with fall  . Hypertension   . Mitral valve prolapse   . Osteopenia   . Stroke [facility])     Past Surgical History:  Procedure Laterality Date  . ANTERIOR CERVICAL DECOMP/DISCECTOMY FUSION N/A  10/23/2012   Procedure: Cervical Five to Cervical Six, Cervical Six to Cervical Seven anterior cervical decompression with fusion plating and bonegraft;  Surgeon: 11/05/2007, MD;  Location: [facility];  Service: Neurosurgery;  Laterality: N/A;  ANTERIOR CERVICAL DECOMPRESSION/DISCECTOMY FUSION 2 LEVELS  . BREAST CYST ASPIRATION  1990   benign  . CARDIAC CATHETERIZATION  08/01/2010   normal coronaries  . COLONOSCOPY  approx 2011  . EYE SURGERY Bilateral 2011  . intestinal blockage surgery  Feb. 2012   "kink in small intestine" per pt    Allergies  Allergen Reactions  . Contrast Media [Iodinated Diagnostic Agents] Hives    Hives during IVP at urology office '02, requires 13 hr prep///[email or web address]  Patient came thru ER and had a 1 hour pre-medication and did fine  . Oxycodone Other (See Comments)    Decreased appetite.  2012 [Ibandronic Acid]     Nausea, vomiting, weakness  . Codeine Diarrhea and Nausea And Vomiting  . Darvocet [Propoxyphene N-Acetaminophen] Diarrhea and Nausea And Vomiting  . Erythromycin Nausea And Vomiting    Allergies as of 07/30/2019      Reactions   Contrast Media [iodinated Diagnostic Agents] Hives   Hives during IVP at urology office '02, requires 13 hr prep///[email or web address] Patient came thru ER and had a 1 hour pre-medication and did fine   Oxycodone Other (  See Comments)   Decreased appetite.   Boniva [ibandronic Acid]    Nausea, vomiting, weakness   Codeine Diarrhea, Nausea And Vomiting   Darvocet [propoxyphene N-acetaminophen] Diarrhea, Nausea And Vomiting   Erythromycin Nausea And Vomiting      Medication List       Accurate as of Jul 30, 2019 11:59 PM. If you have any questions, ask your nurse or doctor.        alendronate 70 MG tablet Commonly known as: FOSAMAX TAKE 1 TAB ONCE A WEEK, AT LEAST 30 MIN BEFORE 1ST FOOD.DO NOT LIE DOWN FOR 30 MIN AFTER TAKING.   amoxicillin 500 MG capsule Commonly known as: AMOXIL Take 2,000 mg by mouth  See admin instructions. Take 4 tablet before dental appointment.   aspirin 81 MG chewable tablet Chew 1 tablet (81 mg total) by mouth daily.   atorvastatin 10 MG tablet Commonly known as: LIPITOR TAKE 1 TABLET BY MOUTH DAILY.   CALCIUM 600+D3 PO Take 600 mg by mouth daily.   lisinopril 5 MG tablet Commonly known as: ZESTRIL TAKE 1 TABLET ONCE DAILY.   metoprolol tartrate 25 MG tablet Commonly known as: LOPRESSOR TAKE 1 TABLET BY MOUTH TWICE DAILY.   NON FORMULARY daily as needed. Hemp lemon salve 500mg  Rub on back for pain   sertraline 50 MG tablet Commonly known as: ZOLOFT Take 1 tablet (50 mg total) by mouth daily. What changed:   medication strength  how much to take Changed by: Denessa Cavan X Winnie Barsky, NP   Stool Softener 100 MG capsule Generic drug: docusate sodium Take 100 mg by mouth at bedtime.       Review of Systems:  Review of Systems  Constitutional: Negative for fatigue, fever and unexpected weight change.       Sometime feels shaky/weak in general, not focal weakness or tremor.   HENT: Positive for hearing loss. Negative for congestion, ear discharge, ear pain and voice change.        L ear impacted ear wax.   Eyes: Positive for visual disturbance.       Pending eye appointment  Respiratory: Positive for cough. Negative for chest tightness, shortness of breath and wheezing.        Chest wall pain palpated under the right breast, better. Chronic hacking cough occasionally.   Cardiovascular: Positive for chest pain. Negative for palpitations and leg swelling.       Pain in chest wall under the right breat is better.   Gastrointestinal: Negative for abdominal pain and constipation.  Genitourinary: Negative for difficulty urinating, frequency and urgency.  Musculoskeletal: Positive for arthralgias, back pain and gait problem.       Balance exercise, lower back exercise.   Skin: Negative for color change.  Neurological: Negative for dizziness, speech difficulty,  weakness and headaches.       Memory lapses occasionally.   Psychiatric/Behavioral: Negative for agitation, behavioral problems and sleep disturbance.       Depressed, not interested in activities, doesn't want to live like this    Health Maintenance  Topic Date Due  . TETANUS/TDAP  07/28/2019  . INFLUENZA VACCINE  10/04/2019  . DEXA SCAN  Completed  . COVID-19 Vaccine  Completed  . PNA vac Low Risk Adult  Completed    Physical Exam: Vitals:   07/30/19 1538  BP: 136/88  Pulse: (!) 54  Temp: (!) 96 F (35.6 C)  SpO2: 96%  Weight: 138 lb 9.6 oz (62.9 kg)  Height: 5\' 2"  (1.575 m)  Body mass index is 25.35 kg/m. Physical Exam Vitals and nursing note reviewed.  Constitutional:      Appearance: Normal appearance.  HENT:     Head: Normocephalic and atraumatic.     Ears:     Comments: Left ear impacted cerumen removed with ear lavage-irrigation only.     Nose: Nose normal.     Mouth/Throat:     Mouth: Mucous membranes are moist.  Eyes:     Extraocular Movements: Extraocular movements intact.     Conjunctiva/sclera: Conjunctivae normal.     Pupils: Pupils are equal, round, and reactive to light.     Comments: Lateral right eye peripheral vision field loss since CVA 11/2018  Cardiovascular:     Rate and Rhythm: Regular rhythm. Bradycardia present.     Heart sounds: No murmur.     Comments: HR 54 bpm Pulmonary:     Breath sounds: No wheezing, rhonchi or rales.     Comments: Discomfort palpated about under the right breast in chest wall/ribs.  Chest:     Chest wall: Tenderness present.  Abdominal:     General: Bowel sounds are normal.     Palpations: Abdomen is soft.     Tenderness: There is no abdominal tenderness.  Musculoskeletal:     Cervical back: Normal range of motion and neck supple.     Right lower leg: No edema.     Left lower leg: No edema.  Skin:    General: Skin is warm and dry.  Neurological:     General: No focal deficit present.     Mental Status:  She is alert and oriented to person, place, and time. Mental status is at baseline.     Motor: No weakness.     Coordination: Coordination normal.     Gait: Gait abnormal.     Comments: walker  Psychiatric:        Behavior: Behavior normal.     Comments: Flat affect, low voice.      Labs reviewed: Basic Metabolic Panel: Recent Labs    12/30/18 1020 05/12/19 0745 06/30/19 0715  NA 142 139 141  K 4.3 3.7 3.7  CL 106 102 106  CO2 29 30 29   GLUCOSE 93 97 94  BUN 19 5* 11  CREATININE 0.76 1.00* 0.72  CALCIUM 9.9 10.1 9.9  TSH  --   --  4.05   Liver Function Tests: Recent Labs    12/02/18 1809 12/02/18 1809 12/30/18 1020 05/12/19 0745 06/30/19 0715  AST 31   < > 21  21 42*  42* 20  ALT 16   < > 14  14 23  23 12   ALKPHOS 57  --   --   --   --   BILITOT 0.9   < > 0.5  0.5 0.7  0.7 0.4  PROT 7.8   < > 6.7  6.7 6.9  6.9 6.6  ALBUMIN 4.5  --   --   --   --    < > = values in this interval not displayed.   Recent Labs    05/12/19 0745 06/30/19 0715  LIPASE 81* 25   No results for input(s): AMMONIA in the last 8760 hours. CBC: Recent Labs    12/02/18 1809 12/02/18 1829 05/12/19 0745  WBC 7.5  --  4.9  NEUTROABS 5.0  --   --   HGB 14.4 15.3* 14.2  HCT 45.2 45.0 41.6  MCV 98.3  --  91.6  PLT  273  --  216   Lipid Panel: Recent Labs    12/30/18 1020 05/12/19 0745 06/30/19 0715  CHOL 116 84 129  HDL 44* 41* 46*  LDLCALC 57 29 69  TRIG 69 67 65  CHOLHDL 2.6 2.0 2.8   Lab Results  Component Value Date   HGBA1C  08/01/2010    5.4 (NOTE)                                                                       According to the ADA Clinical Practice Recommendations for 2011, when HbA1c is used as a screening test:   >=6.5%   Diagnostic of Diabetes Mellitus           (if abnormal result  is confirmed)  5.7-6.4%   Increased risk of developing Diabetes Mellitus  References:Diagnosis and Classification of Diabetes Mellitus,Diabetes Care,2011,34(Suppl  1):S62-S69 and Standards of Medical Care in         Diabetes - 2011,Diabetes Care,2011,34  (Suppl 1):S11-S61.    Procedures since last visit: No results found.  Assessment/Plan  Depression with anxiety Will increase Sertraline to 50mg  qd   Generalized osteoarthritis of multiple sites Better chest wall pain under the right breast, no bruise, negative Murphy's sign, denied GI or respiratory symptoms associated with the pain, no recollection of injury. Observe.   Impacted cerumen of left ear Ear lavage removed ear wax.   Essential hypertension Blood pressure is controlled, continue Metoprolol, Lisinopril. HR in 50s, asymptomatic. Observe.   Osteoporosis Continue Alendronate, Ca   Labs/tests ordered: none  Next appt:  10/09/2019

## 2019-07-31 ENCOUNTER — Encounter: Payer: Self-pay | Admitting: Nurse Practitioner

## 2019-07-31 DIAGNOSIS — H6122 Impacted cerumen, left ear: Secondary | ICD-10-CM | POA: Insufficient documentation

## 2019-07-31 NOTE — Assessment & Plan Note (Signed)
Continue Alendronate, Ca

## 2019-07-31 NOTE — Assessment & Plan Note (Signed)
Blood pressure is controlled, continue Metoprolol, Lisinopril. HR in 50s, asymptomatic. Observe.

## 2019-07-31 NOTE — Assessment & Plan Note (Signed)
Better chest wall pain under the right breast, no bruise, negative Murphy's sign, denied GI or respiratory symptoms associated with the pain, no recollection of injury. Observe.

## 2019-07-31 NOTE — Assessment & Plan Note (Signed)
Ear lavage removed ear wax.  °

## 2019-08-05 ENCOUNTER — Ambulatory Visit (INDEPENDENT_AMBULATORY_CARE_PROVIDER_SITE_OTHER): Payer: Medicare PPO | Admitting: Cardiology

## 2019-08-05 ENCOUNTER — Encounter: Payer: Self-pay | Admitting: Cardiology

## 2019-08-05 ENCOUNTER — Other Ambulatory Visit: Payer: Self-pay

## 2019-08-05 VITALS — BP 138/62 | HR 53 | Ht 62.0 in | Wt 134.2 lb

## 2019-08-05 DIAGNOSIS — E7800 Pure hypercholesterolemia, unspecified: Secondary | ICD-10-CM | POA: Diagnosis not present

## 2019-08-05 DIAGNOSIS — I3139 Other pericardial effusion (noninflammatory): Secondary | ICD-10-CM

## 2019-08-05 DIAGNOSIS — Z8673 Personal history of transient ischemic attack (TIA), and cerebral infarction without residual deficits: Secondary | ICD-10-CM

## 2019-08-05 DIAGNOSIS — Z7189 Other specified counseling: Secondary | ICD-10-CM | POA: Diagnosis not present

## 2019-08-05 DIAGNOSIS — I10 Essential (primary) hypertension: Secondary | ICD-10-CM | POA: Diagnosis not present

## 2019-08-05 DIAGNOSIS — I313 Pericardial effusion (noninflammatory): Secondary | ICD-10-CM

## 2019-08-05 NOTE — Progress Notes (Signed)
Cardiology Office Note:    Date:  08/05/2019   ID:  Adriana [name], DOB Oct 01, 1934, MRN [id number]  PCP:  Virgie Dad, MD  Cardiologist:  Buford Dresser, MD  Referring MD: Virgie Dad, MD   CC: follow up  History of Present Illness:    Adriana [name] is a 85 y.o. female with a hx of CVA 11/2018, hypertension, hyperlipidemia who is seen for follow up today. I initially met her 01/22/19 as a new consult at the request of Virgie Dad, MD for the evaluation and management of pericardial effusion on echo.  Today: Followed by [facility] at the [facility]. Struggled with diverticulitis for a total of about 10 weeks, but has enjoyed being back to her baseline.   Vision is still an issue post CVA, has follow up upcoming with ophthalmology.  Was told in the past she had mitral valve prolapse. Reviewed recent echoes, no significant regurgitation or prolapse seen.   ROS positive for [place] term depression, recently had sertraline dose increased. Stays involved with music as a hobby.  Denies chest pain, shortness of breath at rest or with normal exertion. No PND, orthopnea, LE edema or unexpected weight gain. No syncope or palpitations.   Past Medical History:  Diagnosis Date  . Anginal pain ([place]) 08/24/2010   Echo-EF =>55%,LV normal  . Arthritis   . Cataract 12/2006   Both eyes  Epes MD  . Chest pain, unspecified 09/04/2007   Lexiscan-- EF 72%; LV normal  . Constipation    Takes flax seed and honey .  Smooth Move tea.  . Depression   . Diverticulitis   . Head injury, closed    with fall  . Hypertension   . Mitral valve prolapse   . Osteopenia   . Stroke [facility])     Past Surgical History:  Procedure Laterality Date  . ANTERIOR CERVICAL DECOMP/DISCECTOMY FUSION N/A 10/23/2012   Procedure: Cervical Five to Cervical Six, Cervical Six to Cervical Seven anterior cervical decompression with fusion plating and bonegraft;  Surgeon: Hosie Spangle, MD;  Location: [facility]  NEUR[facility];  Service: Neurosurgery;  Laterality: N/A;  ANTERIOR CERVICAL DECOMPRESSION/DISCECTOMY FUSION 2 LEVELS  . BREAST CYST ASPIRATION  1990   benign  . CARDIAC CATHETERIZATION  08/01/2010   normal coronaries  . COLONOSCOPY  approx 2011  . EYE SURGERY Bilateral 2011  . intestinal blockage surgery  Feb. 2012   "kink in small intestine" per pt    Current Medications: Current Outpatient Medications on File Prior to Visit  Medication Sig  . alendronate (FOSAMAX) 70 MG tablet TAKE 1 TAB ONCE A WEEK, AT LEAST 30 MIN BEFORE 1ST FOOD.DO NOT LIE DOWN FOR 30 MIN AFTER TAKING.  Marland Kitchen amoxicillin (AMOXIL) 500 MG capsule Take 2,000 mg by mouth See admin instructions. Take 4 tablet before dental appointment.  Marland Kitchen aspirin 81 MG chewable tablet Chew 1 tablet (81 mg total) by mouth daily.  Marland Kitchen atorvastatin (LIPITOR) 10 MG tablet TAKE 1 TABLET BY MOUTH DAILY.  . Calcium Carb-Cholecalciferol (CALCIUM 600+D3 PO) Take 600 mg by mouth daily.   Marland Kitchen docusate sodium (STOOL SOFTENER) 100 MG capsule Take 100 mg by mouth at bedtime.  Marland Kitchen lisinopril (ZESTRIL) 5 MG tablet TAKE 1 TABLET ONCE DAILY.  . metoprolol tartrate (LOPRESSOR) 25 MG tablet TAKE 1 TABLET BY MOUTH TWICE DAILY.  . NON FORMULARY daily as needed. Hemp lemon salve 551m Rub on back for pain   . sertraline (ZOLOFT) 50 MG tablet  Take 1 tablet (50 mg total) by mouth daily.   No current facility-administered medications on file prior to visit.     Allergies:   Contrast media [iodinated diagnostic agents], Oxycodone, Boniva [ibandronic acid], Codeine, Darvocet [propoxyphene n-acetaminophen], and Erythromycin   Social History   Tobacco Use  . Smoking status: Never Smoker  . Smokeless tobacco: Never Used  Substance Use Topics  . Alcohol use: Yes    Alcohol/week: 4.0 standard drinks    Types: 2 Glasses of wine, 2 Cans of beer per week    Comment: occasionally  . Drug use: No    Family History: family history includes CVA in her mother; Cancer in her  father; Congestive Heart Failure in her father; Coronary artery disease in her father; Dementia in her mother; Depression in her father; Diabetes in her brother and father; Heart failure in her father; Hypertension in her mother; Parkinson's disease in her mother; Prostate cancer in her brother and paternal grandmother; Transient ischemic attack in her mother.  ROS:   Please see the history of present illness.  Additional pertinent ROS otherwise unremarkable.     EKGs/Labs/Other Studies Reviewed:    The following studies were reviewed today: Monitor 1.4.21 Sinus rhythm Average heart rates 62 bpm (range 44-110 bpm) No prolonged pauses or profound bradycardias No atrial fibrillation  Echo 02/11/19 1. Left ventricular ejection fraction, by visual estimation, is 60 to  65%. The left ventricle has normal function. There is no left ventricular  hypertrophy.  2. Elevated left ventricular end-diastolic pressure.  3. Left ventricular diastolic parameters are consistent with Grade I  diastolic dysfunction (impaired relaxation).  4. Global right ventricle has normal systolic function.The right  ventricular size is normal. No increase in right ventricular wall  thickness.  5. Left atrial size was normal.  6. Right atrial size was normal.  7. Small to moderate pericardial effusion with no significant change  compared to the prior exam. No evidence of tamponade physiology. Mild  right atrial collapse stable as compared to previous. No septal shudder,  no respiratory related septal shift. No  respiratory variation in mitral inflow. Hepatic veins poorly visualized  for Doppler assessment.  8. The pericardial effusion is primarily anterior to the right ventricle.  Small effusion posterior to the left ventricle.  9. The mitral valve is normal in structure. Trivial mitral valve  regurgitation. No evidence of mitral stenosis.  10. The tricuspid valve is normal in structure. Tricuspid valve   regurgitation is trivial.  11. The aortic valve is tricuspid. Aortic valve regurgitation is not  visualized. No evidence of aortic valve sclerosis or stenosis.  12. The pulmonic valve was normal in structure. Pulmonic valve  regurgitation is trivial.  13. The left ventricle has no regional wall motion abnormalities.   Echo 12/22/18 1. There is a small to moderate loculated pericardial effusion present over the anterior RV. There is no echocardiographic evidence of tamponade.  2. Left ventricular ejection fraction, by visual estimation, is 60 to 65%. The left ventricle has normal function. Normal left ventricular size. There is borderline left ventricular hypertrophy.  3. Left ventricular diastolic Doppler parameters are consistent with impaired relaxation pattern of LV diastolic filling.  4. Global right ventricle has normal systolic function.The right ventricular size is normal. No increase in right ventricular wall thickness.  5. Left atrial size was normal.  6. Right atrial size was normal.  7. Moderate pericardial effusion.  8. The pericardial effusion is anterior to the right ventricle.  9.  Mild thickening of the anterior and posterior mitral valve leaflet(s). 10. The mitral valve is myxomatous. No evidence of mitral valve regurgitation. 11. The tricuspid valve is normal in structure. Tricuspid valve regurgitation is trivial. 12. The aortic valve is tricuspid Aortic valve regurgitation was not visualized by color flow Doppler. Structurally normal aortic valve, with no evidence of sclerosis or stenosis. 13. There is Mild calcification of the aortic valve. 14. There is Mild thickening of the aortic valve. 15. The pulmonic valve was grossly normal. Pulmonic valve regurgitation is not visualized by color flow Doppler. 16. Mild plaque invoving the ascending aorta. 17. Normal pulmonary artery systolic pressure. 18. The tricuspid regurgitant velocity is 2.02 m/s, and with an assumed right  atrial pressure of 3 mmHg, the estimated right ventricular systolic pressure is normal at 19.4 mmHg. 19. The inferior vena cava is normal in size with greater than 50% respiratory variability, suggesting right atrial pressure of 3 mmHg.  Heart cath 08/02/2010 HEMODYNAMIC RESULTS: 1. Aortic systolic pressure 619, diastolic pressure 509. 2. Left ventricular systolic pressure 326, end-diastolic pressure 22. 3. RA pressure A-wave 4, V-wave 2, and mean 1. 4. RV pressure systolic 29, end-diastolic pressure 2. 5. PA pressure systolic 27, diastolic pressure 4, and mean 12. 6. Pulmonary capillary wedge pressure A-wave 4, V-wave 4, and mean 2.  SELECTIVE CORONARY ANGIOGRAPHY: 1. Left main normal. 2. LAD normal. 3. Left circumflex normal. 4. Right coronary artery was dominant and normal.  Ventriculography; RAO left ventriculogram was performed using 25 mL Visipaque dye at 12 mL per second.  The overall LVEF was estimated greater than 60% without focal wall motion abnormalities.  Supravalvular aortography; the supravalvular aortogram was performed using 20 mL of Visipaque dye at 20 mL per second.  Aortic root was normal in caliber.  There was no dissection.  There is no AI.  Arch vessels were intact.  IMPRESSION:  Ms. Yust has essentially normal coronary artery, normal LV function, and normal supravalvular aorta and low filling pressures suggesting she is dry.  She certainly is not in tamponade.  The etiology of her chest pain is not cardiac.  She may have pericarditis.  Continued medical therapy will be recommended.  Echo 08/01/2010: Normal EF, small to moderate pericardial effusion  EKG:  EKG is personally reviewed.  The ekg ordered 02/02/19 demonstrates sinus bradycardia  Recent Labs: 05/12/2019: Hemoglobin 14.2; Platelets 216 06/30/2019: ALT 12; BUN 11; Creat 0.72; Potassium 3.7; Sodium 141; TSH 4.05  Recent Lipid Panel    Component Value Date/Time   CHOL 129 06/30/2019 0715    TRIG 65 06/30/2019 0715   HDL 46 (L) 06/30/2019 0715   CHOLHDL 2.8 06/30/2019 0715   VLDL 14 08/01/2010 0130   LDLCALC 69 06/30/2019 0715    Physical Exam:    VS:  BP 138/62   Pulse (!) 53   Ht '5\' 2"'  (1.575 m)   Wt 134 lb 3.2 oz (60.9 kg)   BMI 24.55 kg/m     Wt Readings from Last 3 Encounters:  08/05/19 134 lb 3.2 oz (60.9 kg)  07/30/19 138 lb 9.6 oz (62.9 kg)  07/03/19 135 lb 6.4 oz (61.4 kg)    GEN: frail appearing, in no acute distress HEENT: Normal, moist mucous membranes NECK: No JVD CARDIAC: regular rhythm, normal S1 and S2, no rubs or gallops. No murmur. VASCULAR: Radial and DP pulses 2+ bilaterally. No carotid bruits RESPIRATORY:  Clear to auscultation without rales, wheezing or rhonchi  ABDOMEN: Soft, non-tender, non-distended MUSCULOSKELETAL:  Ambulates  independently SKIN: Warm and dry, no edema NEUROLOGIC:  Alert and oriented x 3. No focal neuro deficits noted. PSYCHIATRIC:  Normal affect   ASSESSMENT:    1. History of CVA (cerebrovascular accident)   2. Essential hypertension   3. Pure hypercholesterolemia   4. Pericardial effusion   5. Cardiac risk counseling   6. Counseling on health promotion and disease prevention    PLAN:    History of pericardial effusion: noted on echo in 2012, cath hemodynamics not suggestive of tamponade -limited echo 02/2019 stable  History of CVA:  -monitor did not show atrial fibrillation -on aspirin and statin  Hypertension: reasonable control given age, no changes  Hypercholesterolemia: LDL goal <70. LDL 69 06/30/19 -continue atorvastatin  Cardiac risk counseling and prevention recommendations: -recommend heart healthy/Mediterranean diet, with whole grains, fruits, vegetable, fish, lean meats, nuts, and olive oil. Limit salt. -recommend moderate walking, 3-5 times/week for 30-50 minutes each session. Aim for at least 150 minutes.week. Goal should be pace of 3 miles/hours, or walking 1.5 miles in 30  minutes -recommend avoidance of tobacco products. Avoid excess alcohol.  Plan for follow up: 1 year or sooner as needed  Medication Adjustments/Labs and Tests Ordered: Current medicines are reviewed at length with the patient today.  Concerns regarding medicines are outlined above.  No orders of the defined types were placed in this encounter.  No orders of the defined types were placed in this encounter.   Patient Instructions  Medication Instructions:  Your Physician recommend you continue on your current medication as directed.    *If you need a refill on your cardiac medications before your next appointment, please call your pharmacy*   Lab Work: None   Testing/Procedures: None   Follow-Up: At [facility], you and your health needs are our priority.  As part of our continuing mission to provide you with exceptional heart care, we have created designated Provider Care Teams.  These Care Teams include your primary Cardiologist (physician) and Advanced Practice Providers (APPs -  Physician Assistants and Nurse Practitioners) who all work together to provide you with the care you need, when you need it.  We recommend signing up for the patient portal called "MyChart".  Sign up information is provided on this After Visit Summary.  MyChart is used to connect with patients for Virtual Visits (Telemedicine).  Patients are able to view lab/test results, encounter notes, upcoming appointments, etc.  Non-urgent messages can be sent to your provider as well.   To learn more about what you can do with MyChart, go to [email or web address].    Your next appointment:   1 year(s)  The format for your next appointment:   In Person  Provider:   Buford Dresser, MD      Signed, Buford Dresser, MD PhD 08/05/2019  [place]

## 2019-08-05 NOTE — Patient Instructions (Signed)

## 2019-08-12 ENCOUNTER — Other Ambulatory Visit: Payer: Self-pay | Admitting: Internal Medicine

## 2019-08-12 DIAGNOSIS — I10 Essential (primary) hypertension: Secondary | ICD-10-CM

## 2019-09-01 DIAGNOSIS — Z961 Presence of intraocular lens: Secondary | ICD-10-CM | POA: Diagnosis not present

## 2019-09-01 DIAGNOSIS — H53461 Homonymous bilateral field defects, right side: Secondary | ICD-10-CM | POA: Diagnosis not present

## 2019-09-01 DIAGNOSIS — H43813 Vitreous degeneration, bilateral: Secondary | ICD-10-CM | POA: Diagnosis not present

## 2019-09-01 DIAGNOSIS — H26493 Other secondary cataract, bilateral: Secondary | ICD-10-CM | POA: Diagnosis not present

## 2019-09-02 ENCOUNTER — Encounter (HOSPITAL_COMMUNITY): Payer: Self-pay | Admitting: Emergency Medicine

## 2019-09-02 ENCOUNTER — Emergency Department (HOSPITAL_COMMUNITY): Payer: Medicare PPO

## 2019-09-02 ENCOUNTER — Inpatient Hospital Stay (HOSPITAL_COMMUNITY)
Admission: EM | Admit: 2019-09-02 | Discharge: 2019-09-05 | DRG: 065 | Disposition: A | Discharge status: Skilled Nursing Facility | Payer: Medicare PPO | Attending: Internal Medicine | Admitting: Internal Medicine

## 2019-09-02 ENCOUNTER — Inpatient Hospital Stay (HOSPITAL_COMMUNITY): Payer: Medicare PPO

## 2019-09-02 ENCOUNTER — Other Ambulatory Visit: Payer: Self-pay

## 2019-09-02 DIAGNOSIS — Z79899 Other long term (current) drug therapy: Secondary | ICD-10-CM

## 2019-09-02 DIAGNOSIS — I110 Hypertensive heart disease with heart failure: Secondary | ICD-10-CM | POA: Diagnosis not present

## 2019-09-02 DIAGNOSIS — G8194 Hemiplegia, unspecified affecting left nondominant side: Secondary | ICD-10-CM | POA: Diagnosis present

## 2019-09-02 DIAGNOSIS — Z7983 Long term (current) use of bisphosphonates: Secondary | ICD-10-CM | POA: Diagnosis not present

## 2019-09-02 DIAGNOSIS — Z8249 Family history of ischemic heart disease and other diseases of the circulatory system: Secondary | ICD-10-CM | POA: Diagnosis not present

## 2019-09-02 DIAGNOSIS — G4489 Other headache syndrome: Secondary | ICD-10-CM | POA: Diagnosis not present

## 2019-09-02 DIAGNOSIS — Y9389 Activity, other specified: Secondary | ICD-10-CM

## 2019-09-02 DIAGNOSIS — Z20822 Contact with and (suspected) exposure to covid-19: Secondary | ICD-10-CM | POA: Diagnosis not present

## 2019-09-02 DIAGNOSIS — I6381 Other cerebral infarction due to occlusion or stenosis of small artery: Principal | ICD-10-CM | POA: Diagnosis present

## 2019-09-02 DIAGNOSIS — W1811XA Fall from or off toilet without subsequent striking against object, initial encounter: Secondary | ICD-10-CM | POA: Diagnosis present

## 2019-09-02 DIAGNOSIS — E785 Hyperlipidemia, unspecified: Secondary | ICD-10-CM | POA: Diagnosis present

## 2019-09-02 DIAGNOSIS — Z823 Family history of stroke: Secondary | ICD-10-CM

## 2019-09-02 DIAGNOSIS — R29700 NIHSS score 0: Secondary | ICD-10-CM | POA: Diagnosis present

## 2019-09-02 DIAGNOSIS — I63331 Cerebral infarction due to thrombosis of right posterior cerebral artery: Secondary | ICD-10-CM | POA: Diagnosis not present

## 2019-09-02 DIAGNOSIS — I5032 Chronic diastolic (congestive) heart failure: Secondary | ICD-10-CM | POA: Diagnosis present

## 2019-09-02 DIAGNOSIS — Z8042 Family history of malignant neoplasm of prostate: Secondary | ICD-10-CM

## 2019-09-02 DIAGNOSIS — R404 Transient alteration of awareness: Secondary | ICD-10-CM | POA: Diagnosis not present

## 2019-09-02 DIAGNOSIS — R5381 Other malaise: Secondary | ICD-10-CM | POA: Diagnosis not present

## 2019-09-02 DIAGNOSIS — S0990XA Unspecified injury of head, initial encounter: Secondary | ICD-10-CM | POA: Diagnosis not present

## 2019-09-02 DIAGNOSIS — R52 Pain, unspecified: Secondary | ICD-10-CM | POA: Diagnosis not present

## 2019-09-02 DIAGNOSIS — W19XXXA Unspecified fall, initial encounter: Secondary | ICD-10-CM | POA: Diagnosis present

## 2019-09-02 DIAGNOSIS — R531 Weakness: Secondary | ICD-10-CM | POA: Diagnosis not present

## 2019-09-02 DIAGNOSIS — I5030 Unspecified diastolic (congestive) heart failure: Secondary | ICD-10-CM | POA: Diagnosis present

## 2019-09-02 DIAGNOSIS — Z981 Arthrodesis status: Secondary | ICD-10-CM | POA: Diagnosis not present

## 2019-09-02 DIAGNOSIS — M810 Age-related osteoporosis without current pathological fracture: Secondary | ICD-10-CM | POA: Diagnosis present

## 2019-09-02 DIAGNOSIS — Z833 Family history of diabetes mellitus: Secondary | ICD-10-CM

## 2019-09-02 DIAGNOSIS — I10 Essential (primary) hypertension: Secondary | ICD-10-CM | POA: Diagnosis not present

## 2019-09-02 DIAGNOSIS — I639 Cerebral infarction, unspecified: Secondary | ICD-10-CM

## 2019-09-02 DIAGNOSIS — Z820 Family history of epilepsy and other diseases of the nervous system: Secondary | ICD-10-CM

## 2019-09-02 DIAGNOSIS — Y92031 Bathroom in apartment as the place of occurrence of the external cause: Secondary | ICD-10-CM

## 2019-09-02 DIAGNOSIS — E876 Hypokalemia: Secondary | ICD-10-CM | POA: Diagnosis present

## 2019-09-02 DIAGNOSIS — Z7982 Long term (current) use of aspirin: Secondary | ICD-10-CM | POA: Diagnosis not present

## 2019-09-02 DIAGNOSIS — Z8673 Personal history of transient ischemic attack (TIA), and cerebral infarction without residual deficits: Secondary | ICD-10-CM | POA: Diagnosis not present

## 2019-09-02 DIAGNOSIS — Z743 Need for continuous supervision: Secondary | ICD-10-CM | POA: Diagnosis not present

## 2019-09-02 DIAGNOSIS — R6889 Other general symptoms and signs: Secondary | ICD-10-CM | POA: Diagnosis not present

## 2019-09-02 DIAGNOSIS — M2550 Pain in unspecified joint: Secondary | ICD-10-CM | POA: Diagnosis not present

## 2019-09-02 DIAGNOSIS — F418 Other specified anxiety disorders: Secondary | ICD-10-CM | POA: Diagnosis not present

## 2019-09-02 DIAGNOSIS — H05232 Hemorrhage of left orbit: Secondary | ICD-10-CM

## 2019-09-02 DIAGNOSIS — S0012XA Contusion of left eyelid and periocular area, initial encounter: Secondary | ICD-10-CM | POA: Diagnosis present

## 2019-09-02 DIAGNOSIS — Z7401 Bed confinement status: Secondary | ICD-10-CM | POA: Diagnosis not present

## 2019-09-02 DIAGNOSIS — S0512XA Contusion of eyeball and orbital tissues, left eye, initial encounter: Secondary | ICD-10-CM | POA: Diagnosis not present

## 2019-09-02 DIAGNOSIS — I6389 Other cerebral infarction: Secondary | ICD-10-CM | POA: Diagnosis not present

## 2019-09-02 DIAGNOSIS — S0993XA Unspecified injury of face, initial encounter: Secondary | ICD-10-CM | POA: Diagnosis not present

## 2019-09-02 HISTORY — DX: Cerebral infarction, unspecified: I63.9

## 2019-09-02 LAB — COMPREHENSIVE METABOLIC PANEL
ALT: 15 U/L (ref 0–44)
AST: 30 U/L (ref 15–41)
Albumin: 4 g/dL (ref 3.5–5.0)
Alkaline Phosphatase: 52 U/L (ref 38–126)
Anion gap: 10 (ref 5–15)
BUN: 14 mg/dL (ref 8–23)
CO2: 25 mmol/L (ref 22–32)
Calcium: 10 mg/dL (ref 8.9–10.3)
Chloride: 105 mmol/L (ref 98–111)
Creatinine, Ser: 0.75 mg/dL (ref 0.44–1.00)
GFR calc Af Amer: >60 mL/min (ref >60)
GFR calc non Af Amer: >60 mL/min (ref >60)
Glucose, Bld: 102 mg/dL — ABNORMAL HIGH (ref 70–99)
Potassium: 4 mmol/L (ref 3.5–5.1)
Sodium: 140 mmol/L (ref 135–145)
Total Bilirubin: 1 mg/dL (ref 0.3–1.2)
Total Protein: 6.9 g/dL (ref 6.5–8.1)

## 2019-09-02 LAB — CBC WITH DIFFERENTIAL/PLATELET
Abs Immature Granulocytes: 0.02 10*3/uL (ref 0.00–0.07)
Basophils Absolute: 0.1 10*3/uL (ref 0.0–0.1)
Basophils Relative: 1 %
Eosinophils Absolute: 0.2 10*3/uL (ref 0.0–0.5)
Eosinophils Relative: 2 %
HCT: 41.6 % (ref 36.0–46.0)
Hemoglobin: 13.5 g/dL (ref 12.0–15.0)
Immature Granulocytes: 0 %
Lymphocytes Relative: 15 %
Lymphs Abs: 1.2 10*3/uL (ref 0.7–4.0)
MCH: 30.7 pg (ref 26.0–34.0)
MCHC: 32.5 g/dL (ref 30.0–36.0)
MCV: 94.5 fL (ref 80.0–100.0)
Monocytes Absolute: 0.7 10*3/uL (ref 0.1–1.0)
Monocytes Relative: 8 %
Neutro Abs: 6 10*3/uL (ref 1.7–7.7)
Neutrophils Relative %: 74 %
Platelets: 236 10*3/uL (ref 150–400)
RBC: 4.4 MIL/uL (ref 3.87–5.11)
RDW: 12.4 % (ref 11.5–15.5)
WBC: 8.1 10*3/uL (ref 4.0–10.5)
nRBC: 0 % (ref 0.0–0.2)

## 2019-09-02 LAB — ECHOCARDIOGRAM COMPLETE
Height: 66 in
Weight: 2160 oz

## 2019-09-02 LAB — URINALYSIS, ROUTINE W REFLEX MICROSCOPIC
Bacteria, UA: NONE SEEN
Bilirubin Urine: NEGATIVE
Glucose, UA: NEGATIVE mg/dL
Ketones, ur: NEGATIVE mg/dL
Leukocytes,Ua: NEGATIVE
Nitrite: NEGATIVE
Protein, ur: NEGATIVE mg/dL
Specific Gravity, Urine: 1.005 (ref 1.005–1.030)
pH: 7 (ref 5.0–8.0)

## 2019-09-02 LAB — RAPID URINE DRUG SCREEN, HOSP PERFORMED
Amphetamines: NOT DETECTED
Barbiturates: NOT DETECTED
Benzodiazepines: NOT DETECTED
Cocaine: NOT DETECTED
Opiates: NOT DETECTED
Tetrahydrocannabinol: NOT DETECTED

## 2019-09-02 LAB — APTT: aPTT: 28 seconds (ref 24–36)

## 2019-09-02 LAB — SARS CORONAVIRUS 2 BY RT PCR (HOSPITAL ORDER, PERFORMED IN ~~LOC~~ HOSPITAL LAB): SARS Coronavirus 2: NEGATIVE

## 2019-09-02 LAB — PROTIME-INR
INR: 1 (ref 0.8–1.2)
Prothrombin Time: 13 seconds (ref 11.4–15.2)

## 2019-09-02 LAB — ETHANOL: Alcohol, Ethyl (B): <10 mg/dL (ref <10)

## 2019-09-02 MED ORDER — ACETAMINOPHEN 160 MG/5ML PO SOLN: 650.0000 mg | ORAL | Status: DC | PRN

## 2019-09-02 MED ORDER — STROKE: EARLY STAGES OF RECOVERY BOOK: Freq: Once | Status: DC

## 2019-09-02 MED ORDER — ACETAMINOPHEN 325 MG PO TABS
650.0000 mg | ORAL_TABLET | ORAL | Status: DC | PRN
  Administered 2019-09-02 (×2): 650 mg via ORAL
  Filled 2019-09-02 (×3): qty 2

## 2019-09-02 MED ORDER — ACETAMINOPHEN 650 MG RE SUPP: 650.0000 mg | RECTAL | Status: DC | PRN

## 2019-09-02 NOTE — H&P (Addendum)
History and Physical    Adriana [name] WIO:[id number] DOB: 06-25-1934 DOA: 09/02/2019  PCP: Mahlon Gammon, MD  Patient coming from: Home  I have personally briefly reviewed patient's old medical records in [facility]  Chief Complaint: Left leg weakness since 3 to 4 days, fall this morning.  HPI: Adriana [name] is a 85 y.o. female with medical history significant of hypertension, CVA with some residual blurry vision in September 2020, hyperlipidemia, depression, cervical spondylosis presents to emergency department for evaluation of left-sided weakness since 3 to 4 days and fall this morning.  Patient tells me that since couple of days he has been unsteady and feels as if she is leaning towards left side.  This morning around 6 AM she fell on the ground and hit on her head.  Denies lightheadedness, dizziness, headache, change in vision prior or after the fall.  No history of loss of consciousness, seizure, slurred speech, facial droop.  Reports unsteady gait and left-sided weakness since couple of days.  She was seen by ophthalmologist outpatient for follow-up and her eyes were dilated for examination.  She thought that her unsteadiness is because of eye dilation.  No history of chest pain, shortness of breath, palpitation, leg swelling, fever, chills, cough, congestion, abdominal pain, urinary or bowel changes.  She lives alone at home.  Uses walker for ambulation.  No history of smoking, alcohol, listed drug use.  ED Course: Upon arrival to ED: Patient heart rate in 50s, other vitals within normal limits.  Afebrile with no leukocytosis.  Basic labs such as CBC, CMP, UA: Negative, PT INR, APTT, ethanol, UDS: Pending.  CT head without contrast obtained which shows no acute intracranial pathology.  Atrophy, signs of prior infarct and evidence of chronic microvascular ischemic changes similar to prior study.  Periorbital hematoma on the left.  EDP consulted neurology recommended MRI brain.  [facility] consulted for admission for stroke work-up.  Review of Systems: As per HPI otherwise negative.    Past Medical History:  Diagnosis Date   Anginal pain (HCC) 08/24/2010   Echo-EF =>55%,LV normal   Arthritis    Cataract 12/2006   Both eyes  Epes MD   Chest pain, unspecified 09/04/2007   Lexiscan-- EF 72%; LV normal   Constipation    Takes flax seed and honey .  Smooth Move tea.   Depression    Diverticulitis    Head injury, closed    with fall   Hypertension    Mitral valve prolapse    Osteopenia    Stroke [facility])     Past Surgical History:  Procedure Laterality Date   ANTERIOR CERVICAL DECOMP/DISCECTOMY FUSION N/A 10/23/2012   Procedure: Cervical Five to Cervical Six, Cervical Six to Cervical Seven anterior cervical decompression with fusion plating and bonegraft;  Surgeon: Hewitt Shorts, MD;  Location: [facility];  Service: Neurosurgery;  Laterality: N/A;  ANTERIOR CERVICAL DECOMPRESSION/DISCECTOMY FUSION 2 LEVELS   BREAST CYST ASPIRATION  1990   benign   CARDIAC CATHETERIZATION  08/01/2010   normal coronaries   COLONOSCOPY  approx 2011   EYE SURGERY Bilateral 2011   intestinal blockage surgery  Feb. 2012   "kink in small intestine" per pt     reports that she has never smoked. She has never used smokeless tobacco. She reports current alcohol use of about 4.0 standard drinks of alcohol per week. She reports that she does not use drugs.  Allergies  Allergen Reactions   Contrast  Media [Iodinated Diagnostic Agents] Hives    Hives during IVP at urology office '02, requires 13 hr prep///[email or web address]  Patient came thru ER and had a 1 hour pre-medication and did fine   Oxycodone Other (See Comments)    Decreased appetite.   Boniva [Ibandronic Acid]     Nausea, vomiting, weakness   Codeine Diarrhea and Nausea And Vomiting   Darvocet [Propoxyphene N-Acetaminophen] Diarrhea and Nausea And Vomiting   Erythromycin Nausea And Vomiting     Family History  Problem Relation Age of Onset   Hypertension Mother    Transient ischemic attack Mother    Parkinson's disease Mother    Dementia Mother    CVA Mother    Diabetes Father    Coronary artery disease Father    Heart failure Father    Cancer Father        prostate   Depression Father    Congestive Heart Failure Father    Prostate cancer Brother    Diabetes Brother    Prostate cancer Paternal Grandmother     Prior to Admission medications   Medication Sig Start Date End Date Taking? Authorizing Provider  alendronate (FOSAMAX) 70 MG tablet TAKE 1 TAB ONCE A WEEK, AT LEAST 30 MIN BEFORE 1ST FOOD.DO NOT LIE DOWN FOR 30 MIN AFTER TAKING. 07/07/19   Mahlon Gammon, MD  amoxicillin (AMOXIL) 500 MG capsule Take 2,000 mg by mouth See admin instructions. Take 4 tablet before dental appointment. 07/31/17   [provider]  aspirin 81 MG chewable tablet Chew 1 tablet (81 mg total) by mouth daily. 12/03/18   Caccavale, Sophia, PA-C  atorvastatin (LIPITOR) 10 MG tablet TAKE 1 TABLET BY MOUTH DAILY. 07/28/19   Mahlon Gammon, MD  Calcium Carb-Cholecalciferol (CALCIUM 600+D3 PO) Take 600 mg by mouth daily.     [provider]  docusate sodium (STOOL SOFTENER) 100 MG capsule Take 100 mg by mouth at bedtime.    [provider]  lisinopril (ZESTRIL) 5 MG tablet TAKE 1 TABLET ONCE DAILY. 08/12/19   Mahlon Gammon, MD  metoprolol tartrate (LOPRESSOR) 25 MG tablet TAKE 1 TABLET BY MOUTH TWICE DAILY. 07/07/19   Mahlon Gammon, MD  NON FORMULARY daily as needed. Hemp lemon salve 500mg  Rub on back for pain     [provider]  sertraline (ZOLOFT) 50 MG tablet Take 1 tablet (50 mg total) by mouth daily. 07/30/19 08/29/19  [name], [name], NP    Physical Exam: Vitals:   09/02/19 1304 09/02/19 1305 09/02/19 1306 09/02/19 1307  BP:      Pulse: (!) 54 (!) 55 (!) 55 (!) 56  Resp: 16 16 18 19   Temp:      TempSrc:      SpO2: 99% 99% 100% 97%  Weight:       Height:        Constitutional: NAD, calm, comfortable,, communicating well, on room air  eyes: PERRL, lids and conjunctivae normal swelling and bruise noted around the left eye. ENMT: Mucous membranes are moist. Posterior pharynx clear of any exudate or lesions.Normal dentition.  Neck: normal, supple, no masses, no thyromegaly Respiratory: clear to auscultation bilaterally, no wheezing, no crackles. Normal respiratory effort. No accessory muscle use.  Cardiovascular: Regular rate and rhythm, no murmurs / rubs / gallops. No extremity edema. 2+ pedal pulses. No carotid bruits.  Abdomen: no tenderness, no masses palpated. No hepatosplenomegaly. Bowel sounds positive.  Musculoskeletal: no clubbing / cyanosis. No joint deformity upper and lower  extremities. Good ROM, no contractures. Normal muscle tone.  Skin: no rashes, lesions, ulcers. No induration Neurologic: CN 2-12 grossly intact. Sensation intact, DTR normal. Strength 5/5 in all 4.  Psychiatric: Normal judgment and insight. Alert and oriented x 3. Normal mood.    Labs on Admission: I have personally reviewed following labs and imaging studies  CBC: Recent Labs  Lab 09/02/19 1130  WBC 8.1  NEUTROABS 6.0  HGB 13.5  HCT 41.6  MCV 94.5  PLT 236   Basic Metabolic Panel: Recent Labs  Lab 09/02/19 1130  NA 140  K 4.0  CL 105  CO2 25  GLUCOSE 102*  BUN 14  CREATININE 0.75  CALCIUM 10.0   GFR: Estimated Creatinine Clearance: 50.8 mL/min (by C-G formula based on SCr of 0.75 mg/dL). Liver Function Tests: Recent Labs  Lab 09/02/19 1130  AST 30  ALT 15  ALKPHOS 52  BILITOT 1.0  PROT 6.9  ALBUMIN 4.0   No results for input(s): LIPASE, AMYLASE in the last 168 hours. No results for input(s): AMMONIA in the last 168 hours. Coagulation Profile: No results for input(s): INR, PROTIME in the last 168 hours. Cardiac Enzymes: No results for input(s): CKTOTAL, CKMB, CKMBINDEX, TROPONINI in the last 168 hours. BNP (last 3  results) No results for input(s): PROBNP in the last 8760 hours. HbA1C: No results for input(s): HGBA1C in the last 72 hours. CBG: No results for input(s): GLUCAP in the last 168 hours. Lipid Profile: No results for input(s): CHOL, HDL, LDLCALC, TRIG, CHOLHDL, LDLDIRECT in the last 72 hours. Thyroid Function Tests: No results for input(s): TSH, T4TOTAL, FREET4, T3FREE, THYROIDAB in the last 72 hours. Anemia Panel: No results for input(s): VITAMINB12, FOLATE, FERRITIN, TIBC, IRON, RETICCTPCT in the last 72 hours. Urine analysis:    Component Value Date/Time   COLORURINE COLORLESS (A) 09/02/2019 1045   APPEARANCEUR CLEAR 09/02/2019 1045   LABSPEC 1.005 09/02/2019 1045   PHURINE 7.0 09/02/2019 1045   GLUCOSEU NEGATIVE 09/02/2019 1045   HGBUR SMALL (A) 09/02/2019 1045   BILIRUBINUR NEGATIVE 09/02/2019 1045   KETONESUR NEGATIVE 09/02/2019 1045   PROTEINUR NEGATIVE 09/02/2019 1045   UROBILINOGEN 0.2 10/31/2013 1255   NITRITE NEGATIVE 09/02/2019 1045   LEUKOCYTESUR NEGATIVE 09/02/2019 1045    Radiological Exams on Admission: CT Head Wo Contrast  Result Date: 09/02/2019 CLINICAL DATA:  Facial trauma, hematoma above the LEFT eye EXAM: CT HEAD WITHOUT CONTRAST CT MAXILLOFACIAL WITHOUT CONTRAST TECHNIQUE: Multidetector CT imaging of the head and maxillofacial structures were performed using the standard protocol without intravenous contrast. Multiplanar CT image reconstructions of the maxillofacial structures were also generated. COMPARISON:  MRI brain of 12/03/2018 FINDINGS: CT HEAD FINDINGS Brain: No evidence of acute infarction, hemorrhage, hydrocephalus, extra-axial collection or mass lesion/mass effect. Signs of atrophy, chronic microvascular ischemic change in deep and periventricular white matter and of prior infarct. Vascular: No hyperdense vessel or unexpected calcification. Skull: Normal. Negative for fracture or focal lesion. Other: LEFT supraorbital/frontal hematoma along the lateral  margin of the LEFT orbit, see below. CT MAXILLOFACIAL FINDINGS Osseous: No fracture or mandibular dislocation. No destructive process. Orbits: Periorbital stranding on the LEFT mainly along the lateral and superior margin of the orbit. No retro-orbital fat stranding. Sinuses: Clear. Soft tissues: Periorbital hematoma on the LEFT. IMPRESSION: 1. No acute intracranial pathology. Atrophy, signs of prior infarct and evidence of chronic microvascular ischemic change similar to the prior study. 2. No acute facial bone fracture. 3. Periorbital hematoma on the LEFT mainly along the lateral and  superior margin of the orbit. No retro-orbital fat stranding. Electronically Signed   By: Donzetta Kohut M.D.   On: 09/02/2019 12:00    EKG: Independently reviewed.  Sinus rhythm.  No ST elevation or depression noted  Assessment/Plan Active Problems:   Depression with anxiety   Hypertension   CVA (cerebral vascular accident) (HCC)   Diastolic CHF (HCC)   Ischemic stroke (HCC)    Ischemic stroke: -Patient presented with left-leg weakness since couple of days.  Had fall this morning.  Reviewed CT head without contrast.  MRI brain and MRI maxillofacial is ordered and is pending. -admit forTelemetry monitoring -Allow for permissive hypertension for the first 24-48h - only treat PRN if SBP >220 mmHg or diastolic blood pressure >120. Blood pressures can be gradually normalized to SBP<140 upon discharge. -Check lipid panel, A1c, transthoracic echo.  -Frequent neuro checks -Continue aspirin and statin -Consult Neurology-await recommendations -PT/OT eval, Speech consult  Left periorbital hematoma: -Status post fall -Reviewed CT head.  MRI maxillofacial is ordered and is pending. -Monitor H&H.  Tylenol as needed for pain control -On fall precautions  Hypertension: Hold blood pressure medicine lisinopril and metoprolol.  Allow permissive hypertension.  Hyperlipidemia: Check lipid panel.  Continue  statin  Depression/anxiety: Continue Zoloft  Osteopenia/osteoporosis: Continue Fosamax  Chronic diastolic congestive heart failure: Stable -Patient appears euvolemic on exam. -Reviewed echo from 02/11/2019 which showed grade 1 diastolic dysfunction -Patient is not on diuretics. -Continue to monitor.  DVT prophylaxis: SCD Code Status: Full code Family Communication: Patient's daughter present at bedside.  Plan of care discussed with patient and her daughter in length and they verbalized understanding and agreed with it. Disposition Plan: Likely home in 2 days Consults called: Neurology by EDP Admission status: Inpatient   Ollen Bowl MD [facility]  If 7PM-7AM, please contact night-coverage [email or web address] Password [email or web address]  09/02/2019, 2:00 PM

## 2019-09-02 NOTE — Consult Note (Addendum)
Neurology Consultation  Reason for Consult: Stroke  Referring Physician: Ollen BowlPahwani, Rinka R, MD  CC: Thalamic stroke  History is obtained from: Daughter and patient  HPI: Adriana MilchJane A [name] is an 85 y.o. female with a past medical history of stroke, mitral valve prolapse, closed head injury, and previous anterior cervical decompression.  Patient noted 3 to 4 days ago that her left leg was weak.  Patient went to sleep last night approximately at 11 PM.  When she woke up at 6 AM she reports that she fell on the ground and hit her head.  She does state that in the past she has had dizzy spells when standing up but that is not what occurred at this time.  She also noted that she had an unsteady gait and daughter states that she was listing to the left side.  It was for this reason the patient came into the ED.  When in the ED patient obtained an MRI, which revealed an acute right thalamic lacunar infarct.  Patient states that she is on aspirin 81 mg daily and does take it as prescribed.  Neurology was asked to see patient secondary to the thalamic infarct.  LKW: 3-4 days ago tpa given?: no, out of window Premorbid modified Rankin scale (mRS):1 NIH stroke score: 0   Past Medical History:  Diagnosis Date  . Anginal pain (HCC) 08/24/2010   Echo-EF =>55%,LV normal  . Arthritis   . Cataract 12/2006   Both eyes  Epes MD  . Chest pain, unspecified 09/04/2007   Lexiscan-- EF 72%; LV normal  . Constipation    Takes flax seed and honey .  Smooth Move tea.  . Depression   . Diverticulitis   . Head injury, closed    with fall  . Hypertension   . Mitral valve prolapse   . Osteopenia   . Stroke [facility](HCC)     Family History  Problem Relation Age of Onset  . Hypertension Mother   . Transient ischemic attack Mother   . Parkinson's disease Mother   . Dementia Mother   . CVA Mother   . Diabetes Father   . Coronary artery disease Father   . Heart failure Father   . Cancer Father        prostate  .  Depression Father   . Congestive Heart Failure Father   . Prostate cancer Brother   . Diabetes Brother   . Prostate cancer Paternal Grandmother    Social History:   reports that she has never smoked. She has never used smokeless tobacco. She reports current alcohol use of about 4.0 standard drinks of alcohol per week. She reports that she does not use drugs.  Medications  Current Facility-Administered Medications:  .   stroke: mapping our early stages of recovery book, , Does not apply, Once, Pahwani, Rinka R, MD .  acetaminophen (TYLENOL) tablet 650 mg, 650 mg, Oral, Q4H PRN, 650 mg at 09/02/19 1409 **OR** acetaminophen (TYLENOL) 160 MG/5ML solution 650 mg, 650 mg, Per Tube, Q4H PRN **OR** acetaminophen (TYLENOL) suppository 650 mg, 650 mg, Rectal, Q4H PRN, Pahwani, Rinka R, MD  Current Outpatient Medications:  .  alendronate (FOSAMAX) 70 MG tablet, TAKE 1 TAB ONCE A WEEK, AT LEAST 30 MIN BEFORE 1ST FOOD.DO NOT LIE DOWN FOR 30 MIN AFTER TAKING. (Patient taking differently: Take 70 mg by mouth every Thursday. ), Disp: 12 tablet, Rfl: 1 .  amoxicillin (AMOXIL) 500 MG capsule, Take 2,000 mg by mouth See admin instructions.  Take 4 tablet before dental appointment., Disp: , Rfl:  .  aspirin 81 MG chewable tablet, Chew 1 tablet (81 mg total) by mouth daily., Disp: 30 tablet, Rfl: 0 .  atorvastatin (LIPITOR) 10 MG tablet, TAKE 1 TABLET BY MOUTH DAILY. (Patient taking differently: Take 10 mg by mouth in the morning. ), Disp: 90 tablet, Rfl: 1 .  Calcium Carb-Cholecalciferol (CALCIUM 600+D3 PO), Take 600 mg by mouth daily. , Disp: , Rfl:  .  docusate sodium (STOOL SOFTENER) 100 MG capsule, Take 100 mg by mouth at bedtime., Disp: , Rfl:  .  lisinopril (ZESTRIL) 5 MG tablet, TAKE 1 TABLET ONCE DAILY. (Patient taking differently: Take 5 mg by mouth in the morning. ), Disp: 90 tablet, Rfl: 0 .  metoprolol tartrate (LOPRESSOR) 25 MG tablet, TAKE 1 TABLET BY MOUTH TWICE DAILY. (Patient taking differently:  Take 25 mg by mouth 2 (two) times daily. ), Disp: 180 tablet, Rfl: 1 .  NON FORMULARY, daily as needed. Hemp lemon salve 500mg  Rub on back for pain , Disp: , Rfl:  .  sertraline (ZOLOFT) 50 MG tablet, Take 1 tablet (50 mg total) by mouth daily. (Patient taking differently: Take 50 mg by mouth at bedtime. ), Disp: 30 tablet, Rfl: 3  ROS:    General ROS: negative for - chills, fatigue, fever, night sweats, weight gain or weight loss Psychological ROS: negative for - behavioral disorder, hallucinations, memory difficulties, mood swings or suicidal ideation Ophthalmic ROS: negative for - blurry vision, double vision, eye pain or loss of vision ENT ROS: negative for - epistaxis, nasal discharge, oral lesions, sore throat, tinnitus or vertigo Allergy and Immunology ROS: negative for - hives or itchy/watery eyes Hematological and Lymphatic ROS: negative for - bleeding problems, bruising or swollen lymph nodes Endocrine ROS: negative for - galactorrhea, hair pattern changes, polydipsia/polyuria or temperature intolerance Respiratory ROS: negative for - cough, hemoptysis, shortness of breath or wheezing Cardiovascular ROS: negative for - chest pain, dyspnea on exertion, edema or irregular heartbeat Gastrointestinal ROS: negative for - abdominal pain, diarrhea, hematemesis, nausea/vomiting or stool incontinence Genito-Urinary ROS: negative for - dysuria, hematuria, incontinence or urinary frequency/urgency Musculoskeletal ROS: Positive for -left leg muscular weakness Neurological ROS: as noted in HPI Dermatological ROS: negative for rash and skin lesion changes  Exam: Current vital signs: BP (!) 148/80   Pulse (!) 57   Temp 97.9 F (36.6 C) (Oral)   Resp 12   Ht 5\' 6"  (1.676 m)   Wt 61.2 kg   SpO2 98%   BMI 21.79 kg/m  Vital signs in last 24 hours: Temp:  [97.9 F (36.6 C)] 97.9 F (36.6 C) (06/30 1001) Pulse Rate:  [47-102] 57 (06/30 1402) Resp:  [0-37] 12 (06/30 1402) BP:  (148-189)/(70-96) 148/80 (06/30 1402) SpO2:  [83 %-100 %] 98 % (06/30 1402) Weight:  [61.2 kg] 61.2 kg (06/30 1001)   Constitutional: Appears well-developed and well-nourished.  Psych: Affect appropriate to situation Eyes: No scleral injection HENT: No OP obstrucion, positive hematoma around the left eye Head: Normocephalic.  Cardiovascular: Normal rate and regular rhythm.  Respiratory: Effort normal, non-labored breathing GI: Soft.  No distension. There is no tenderness.  Skin: WDI  Neuro: Mental Status: Patient is awake, alert, oriented to person, place, month, year, and situation. Speech-shows no dysarthria or aphasia.  She is able to follow commands with no difficulty.  Slightly slow to respond at times. Cranial Nerves: II: Visual Fields are full.  III,IV, VI: EOMI without ptosis or diploplia. Pupils  equal, round and reactive to light V: Facial sensation is symmetric to temperature VII: Facial movement is symmetric.  VIII: hearing is intact to voice X: Palat elevates symmetrically XI: Shoulder shrug is symmetric. XII: tongue is midline without atrophy or fasciculations.  Motor: Left tricep extension is 4/5 and left straight leg rise is 4/5 otherwise 5/5 throughout. Drift-none noted Sensory: Sensation is symmetric to light touch and temperature in the arms and legs. Deep Tendon Reflexes: 2+ at the Achilles, 3+ at knee jerk with cross adductors, 3+ at the brachioradialis with Hoffmann's positive.  I would attribute the brisk reflexes to the previous neck problem for which she had the ACDF. Plantars: Upgoing on the right, downgoing on the left Cerebellar: FNF and HKS are intact bilaterally  Labs I have reviewed labs in epic and the results pertinent to this consultation are:   CBC    Component Value Date/Time   WBC 8.1 09/02/2019 1130   RBC 4.40 09/02/2019 1130   HGB 13.5 09/02/2019 1130   HCT 41.6 09/02/2019 1130   PLT 236 09/02/2019 1130   MCV 94.5 09/02/2019  1130   MCH 30.7 09/02/2019 1130   MCHC 32.5 09/02/2019 1130   RDW 12.4 09/02/2019 1130   LYMPHSABS 1.2 09/02/2019 1130   MONOABS 0.7 09/02/2019 1130   EOSABS 0.2 09/02/2019 1130   BASOSABS 0.1 09/02/2019 1130    CMP     Component Value Date/Time   NA 140 09/02/2019 1130   NA 137 05/10/2017 0000   K 4.0 09/02/2019 1130   CL 105 09/02/2019 1130   CL 101 05/10/2017 0000   CO2 25 09/02/2019 1130   CO2 31 05/10/2017 0000   GLUCOSE 102 (H) 09/02/2019 1130   BUN 14 09/02/2019 1130   BUN 16 05/10/2017 0000   CREATININE 0.75 09/02/2019 1130   CREATININE 0.72 06/30/2019 0715   CALCIUM 10.0 09/02/2019 1130   CALCIUM 10.6 05/10/2017 0000   PROT 6.9 09/02/2019 1130   ALBUMIN 4.0 09/02/2019 1130   AST 30 09/02/2019 1130   ALT 15 09/02/2019 1130   ALKPHOS 52 09/02/2019 1130   BILITOT 1.0 09/02/2019 1130   GFRNONAA >60 09/02/2019 1130   GFRNONAA 52 (L) 05/12/2019 0745   GFRAA >60 09/02/2019 1130   GFRAA 61 05/12/2019 0745    Lipid Panel     Component Value Date/Time   CHOL 129 06/30/2019 0715   TRIG 65 06/30/2019 0715   HDL 46 (L) 06/30/2019 0715   CHOLHDL 2.8 06/30/2019 0715   VLDL 14 08/01/2010 0130   LDLCALC 69 06/30/2019 0715     Imaging I have reviewed the images obtained:  CT-scan of the brain-no significant mass or bleed  MRI examination of the brain- acute right thalamic lacunar infarct  Felicie Morn PA-C [facility] [phone] 09/02/2019, 4:15 PM   Assessment: 84 year old female presenting to the hospital after 3 to 4 days of left leg weakness and now a fall.   1. MRI revealed an acute right thalamic lacunar infarct.  Most likely small vessel in origin.   2. Patient states that her blood pressure is usually low. However, she has not taken her blood pressure reading for quite a while.  Recommendations: -Change aspirin to Plavix 75 mg daily -Obtain carotid duplex  --MRA of head --TTE --Cardiac telemetry --PT/OT/Speech  --BP management. Out  of permissive HTN time window --Stroke Team to follow in the AM  I have seen and examined the patient. I have formulated the assessment and recommendations. 85 year old female  presenting to the hospital after 3 to 4 days of left leg weakness and now a fall. MRI reveals an acute right thalamic lacunar infarct, most likely small vessel in origin. Stroke work up and management as above.  Electronically signed: Dr. Caryl Pina

## 2019-09-02 NOTE — ED Provider Notes (Signed)
[facility] EMERGENCY DEPARTMENT Provider Note   CSN: [id number] Arrival date & time: 09/02/19  8416     History Chief Complaint  Patient presents with  . Fall  . Weakness    Adriana [name] is a 85 y.o. female.  HPI Patient presents after fall.  States she fell walking with her walker.  States for the last day or 2 she has been unsteady and feels as if she is leaning to the left side.  She states that is what caused her to fall today.  States she is had more trouble moving around.  Did have a stroke back in October.  States it was more vision.  States she was seen by ophthalmology yesterday and was dilated to further evaluate her eye.  States it was the ophthalmologist that thought she may have had a stroke to start with.  She is on aspirin.  No headache.  No confusion.  Does have bruising to left eye.  Had seen Dr. Marjory Lies and had reassuring echocardiogram and cardiac monitoring.    Past Medical History:  Diagnosis Date  . Anginal pain (HCC) 08/24/2010   Echo-EF =>55%,LV normal  . Arthritis   . Cataract 12/2006   Both eyes  Epes MD  . Chest pain, unspecified 09/04/2007   Lexiscan-- EF 72%; LV normal  . Constipation    Takes flax seed and honey .  Smooth Move tea.  . Depression   . Diverticulitis   . Head injury, closed    with fall  . Hypertension   . Mitral valve prolapse   . Osteopenia   . Stroke [facility])     Patient Active Problem List   Diagnosis Date Noted  . Impacted cerumen of left ear 07/31/2019  . Abdominal pain 06/25/2019  . Diastolic CHF (HCC) 02/15/2019  . History of CVA (cerebrovascular accident) 01/28/2019  . Pure hypercholesterolemia 01/28/2019  . Pericardial effusion 01/28/2019  . CVA (cerebral vascular accident) (HCC) 12/04/2018  . Weight loss 08/07/2018  . MVP (mitral valve prolapse) 10/15/2017  . Constipation 07/11/2017  . Generalized osteoarthritis of multiple sites 07/11/2017  . Incontinent of urine 07/11/2017  . Osteoporosis  09/04/2012  . Depression with anxiety 09/01/2012  . Essential hypertension 09/01/2012  . C6 cervical fracture (HCC) 08/01/2012  . C7 cervical fracture (HCC) 08/01/2012  . Diverticulitis of colon 08/01/2012    Past Surgical History:  Procedure Laterality Date  . ANTERIOR CERVICAL DECOMP/DISCECTOMY FUSION N/A 10/23/2012   Procedure: Cervical Five to Cervical Six, Cervical Six to Cervical Seven anterior cervical decompression with fusion plating and bonegraft;  Surgeon: Hewitt Shorts, MD;  Location: [facility];  Service: Neurosurgery;  Laterality: N/A;  ANTERIOR CERVICAL DECOMPRESSION/DISCECTOMY FUSION 2 LEVELS  . BREAST CYST ASPIRATION  1990   benign  . CARDIAC CATHETERIZATION  08/01/2010   normal coronaries  . COLONOSCOPY  approx 2011  . EYE SURGERY Bilateral 2011  . intestinal blockage surgery  Feb. 2012   "kink in small intestine" per pt     OB History   No obstetric history on file.     Family History  Problem Relation Age of Onset  . Hypertension Mother   . Transient ischemic attack Mother   . Parkinson's disease Mother   . Dementia Mother   . CVA Mother   . Diabetes Father   . Coronary artery disease Father   . Heart failure Father   . Cancer Father        prostate  .  Depression Father   . Congestive Heart Failure Father   . Prostate cancer Brother   . Diabetes Brother   . Prostate cancer Paternal Grandmother     Social History   Tobacco Use  . Smoking status: Never Smoker  . Smokeless tobacco: Never Used  Vaping Use  . Vaping Use: Never used  Substance Use Topics  . Alcohol use: Yes    Alcohol/week: 4.0 standard drinks    Types: 2 Glasses of wine, 2 Cans of beer per week    Comment: occasionally  . Drug use: No    Home Medications Prior to Admission medications   Medication Sig Start Date End Date Taking? Authorizing Provider  alendronate (FOSAMAX) 70 MG tablet TAKE 1 TAB ONCE A WEEK, AT LEAST 30 MIN BEFORE 1ST FOOD.DO NOT LIE DOWN FOR 30 MIN  AFTER TAKING. 07/07/19   Mahlon Gammon, MD  amoxicillin (AMOXIL) 500 MG capsule Take 2,000 mg by mouth See admin instructions. Take 4 tablet before dental appointment. 07/31/17   [provider]  aspirin 81 MG chewable tablet Chew 1 tablet (81 mg total) by mouth daily. 12/03/18   Caccavale, Sophia, PA-C  atorvastatin (LIPITOR) 10 MG tablet TAKE 1 TABLET BY MOUTH DAILY. 07/28/19   Mahlon Gammon, MD  Calcium Carb-Cholecalciferol (CALCIUM 600+D3 PO) Take 600 mg by mouth daily.     [provider]  docusate sodium (STOOL SOFTENER) 100 MG capsule Take 100 mg by mouth at bedtime.    [provider]  lisinopril (ZESTRIL) 5 MG tablet TAKE 1 TABLET ONCE DAILY. 08/12/19   Mahlon Gammon, MD  metoprolol tartrate (LOPRESSOR) 25 MG tablet TAKE 1 TABLET BY MOUTH TWICE DAILY. 07/07/19   Mahlon Gammon, MD  NON FORMULARY daily as needed. Hemp lemon salve 500mg  Rub on back for pain     [provider]  sertraline (ZOLOFT) 50 MG tablet Take 1 tablet (50 mg total) by mouth daily. 07/30/19 08/29/19  [name], [name], NP    Allergies    Contrast media [iodinated diagnostic agents], Oxycodone, Boniva [ibandronic acid], Codeine, Darvocet [propoxyphene n-acetaminophen], and Erythromycin  Review of Systems   Review of Systems  Constitutional: Negative for appetite change.  HENT: Negative for congestion.   Respiratory: Negative for shortness of breath.   Cardiovascular: Negative for chest pain.  Gastrointestinal: Negative for abdominal pain.  Genitourinary: Negative for flank pain.  Musculoskeletal: Negative for back pain.  Skin: Negative for wound.  Neurological: Positive for weakness. Negative for speech difficulty.  Hematological: Bruises/bleeds easily.    Physical Exam Updated Vital Signs BP (!) 149/70   Pulse (!) 56   Temp 97.9 F (36.6 C) (Oral)   Resp 19   Ht 5\' 6"  (1.676 m)   Wt 61.2 kg   SpO2 97%   BMI 21.79 kg/m   Physical Exam Vitals and nursing note reviewed.    HENT:     Head:     Comments: Periorbital hematoma on the left side.  Eye movements intact.  No exophthalmos or sunken eyes. Eyes:     Extraocular Movements: Extraocular movements intact.     Pupils: Pupils are equal, round, and reactive to light.  Cardiovascular:     Rate and Rhythm: Normal rate and regular rhythm.  Pulmonary:     Breath sounds: No rhonchi.  Abdominal:     Tenderness: There is no abdominal tenderness.  Musculoskeletal:        General: Normal range of motion.  Skin:  General: Skin is warm.     Capillary Refill: Capillary refill takes less than 2 seconds.  Neurological:     Mental Status: She is alert.     Comments: They symmetric.  Eye movements intact.  Good grip strength bilaterally.  However finger nose is off on the left compared to the right.  Heel shin is also slightly off.  Good strength in upper extremities without drift, however may have some less strength on left lower extremity compared to right.  Normal speech.  Gait not tested.     ED Results / Procedures / Treatments   Labs (all labs ordered are listed, but only abnormal results are displayed) Labs Reviewed  COMPREHENSIVE METABOLIC PANEL - Abnormal; Notable for the following components:      Result Value   Glucose, Bld 102 (*)    All other components within normal limits  URINALYSIS, ROUTINE W REFLEX MICROSCOPIC - Abnormal; Notable for the following components:   Color, Urine COLORLESS (*)    Hgb urine dipstick SMALL (*)    All other components within normal limits  CBC WITH DIFFERENTIAL/PLATELET    EKG EKG Interpretation  Date/Time:  Wednesday September 02 2019 10:03:21 EDT Ventricular Rate:  53 PR Interval:    QRS Duration: 81 QT Interval:  438 QTC Calculation: 412 R Axis:   15 Text Interpretation: Sinus rhythm Confirmed by Benjiman CorePickering, Raahim Shartzer [phone]([id number]) on 09/02/2019 11:39:07 AM   Radiology CT Head Wo Contrast  Result Date: 09/02/2019 CLINICAL DATA:  Facial trauma, hematoma above the  LEFT eye EXAM: CT HEAD WITHOUT CONTRAST CT MAXILLOFACIAL WITHOUT CONTRAST TECHNIQUE: Multidetector CT imaging of the head and maxillofacial structures were performed using the standard protocol without intravenous contrast. Multiplanar CT image reconstructions of the maxillofacial structures were also generated. COMPARISON:  MRI brain of 12/03/2018 FINDINGS: CT HEAD FINDINGS Brain: No evidence of acute infarction, hemorrhage, hydrocephalus, extra-axial collection or mass lesion/mass effect. Signs of atrophy, chronic microvascular ischemic change in deep and periventricular white matter and of prior infarct. Vascular: No hyperdense vessel or unexpected calcification. Skull: Normal. Negative for fracture or focal lesion. Other: LEFT supraorbital/frontal hematoma along the lateral margin of the LEFT orbit, see below. CT MAXILLOFACIAL FINDINGS Osseous: No fracture or mandibular dislocation. No destructive process. Orbits: Periorbital stranding on the LEFT mainly along the lateral and superior margin of the orbit. No retro-orbital fat stranding. Sinuses: Clear. Soft tissues: Periorbital hematoma on the LEFT. IMPRESSION: 1. No acute intracranial pathology. Atrophy, signs of prior infarct and evidence of chronic microvascular ischemic change similar to the prior study. 2. No acute facial bone fracture. 3. Periorbital hematoma on the LEFT mainly along the lateral and superior margin of the orbit. No retro-orbital fat stranding. Electronically Signed   By: Donzetta KohutGeoffrey  Wile M.D.   On: 09/02/2019 12:00    Procedures Procedures (including critical care time)  Medications Ordered in ED Medications - No data to display  ED Course  I have reviewed the triage vital signs and the nursing notes.  Pertinent labs & imaging results that were available during my care of the patient were reviewed by me and considered in my medical decision making (see chart for details).    MDM Rules/Calculators/A&P                           Patient presents with focal neuro deficits.  However began a day or 2 ago.  With left-sided weakness.  CT scan done because of fall and  shows the periorbital hemorrhage without other severe injury.  MRI done and showed small acute infarct of right thalamus.  This would correlate with left-sided symptoms.  Not a TPA candidate due to time of onset. However patient states that at home she would not be able to manage with this with the unsteadiness and the difficulty using the left side.  With this I feels the patient benefit from admission the hospital for neurologic consultation, PT OT and further evaluation. Final Clinical Impression(s) / ED Diagnoses Final diagnoses:  Cerebrovascular accident (CVA), unspecified mechanism (HCC)  Fall, initial encounter  Periorbital hematoma of left eye    Rx / DC Orders ED Discharge Orders    None       Benjiman Core, MD 09/02/19 1317

## 2019-09-02 NOTE — ED Notes (Signed)
Pure wick placed.

## 2019-09-02 NOTE — Progress Notes (Signed)
  Echocardiogram 2D Echocardiogram has been performed.  Adriana [name] 09/02/2019, 3:27 PM

## 2019-09-02 NOTE — ED Triage Notes (Addendum)
To ED via [facility] from [facility]- Independent living- after falling this am off commode, at approx 6am. Sat on the floor until approx 6:45,  Has a hematoma to left eye area  And left thumb. Denies any vision problems- did have eyes dilated yesterday for follow up of ocular stroke.

## 2019-09-03 ENCOUNTER — Inpatient Hospital Stay (HOSPITAL_COMMUNITY): Payer: Medicare PPO

## 2019-09-03 DIAGNOSIS — I639 Cerebral infarction, unspecified: Secondary | ICD-10-CM

## 2019-09-03 DIAGNOSIS — I5032 Chronic diastolic (congestive) heart failure: Secondary | ICD-10-CM

## 2019-09-03 DIAGNOSIS — I63331 Cerebral infarction due to thrombosis of right posterior cerebral artery: Secondary | ICD-10-CM

## 2019-09-03 DIAGNOSIS — Z8673 Personal history of transient ischemic attack (TIA), and cerebral infarction without residual deficits: Secondary | ICD-10-CM

## 2019-09-03 LAB — LIPID PANEL
Cholesterol: 137 mg/dL (ref 0–200)
HDL: 50 mg/dL (ref >40)
LDL Cholesterol: 73 mg/dL (ref 0–99)
Total CHOL/HDL Ratio: 2.7 RATIO
Triglycerides: 71 mg/dL (ref <150)
VLDL: 14 mg/dL (ref 0–40)

## 2019-09-03 LAB — CBC WITH DIFFERENTIAL/PLATELET
Abs Immature Granulocytes: 0.02 10*3/uL (ref 0.00–0.07)
Basophils Absolute: 0 10*3/uL (ref 0.0–0.1)
Basophils Relative: 1 %
Eosinophils Absolute: 0.3 10*3/uL (ref 0.0–0.5)
Eosinophils Relative: 5 %
HCT: 39.3 % (ref 36.0–46.0)
Hemoglobin: 12.8 g/dL (ref 12.0–15.0)
Immature Granulocytes: 0 %
Lymphocytes Relative: 21 %
Lymphs Abs: 1.4 10*3/uL (ref 0.7–4.0)
MCH: 30.5 pg (ref 26.0–34.0)
MCHC: 32.6 g/dL (ref 30.0–36.0)
MCV: 93.8 fL (ref 80.0–100.0)
Monocytes Absolute: 0.7 10*3/uL (ref 0.1–1.0)
Monocytes Relative: 11 %
Neutro Abs: 4.1 10*3/uL (ref 1.7–7.7)
Neutrophils Relative %: 62 %
Platelets: 220 10*3/uL (ref 150–400)
RBC: 4.19 MIL/uL (ref 3.87–5.11)
RDW: 12.4 % (ref 11.5–15.5)
WBC: 6.6 10*3/uL (ref 4.0–10.5)
nRBC: 0 % (ref 0.0–0.2)

## 2019-09-03 LAB — BASIC METABOLIC PANEL
Anion gap: 9 (ref 5–15)
BUN: 16 mg/dL (ref 8–23)
CO2: 24 mmol/L (ref 22–32)
Calcium: 9.5 mg/dL (ref 8.9–10.3)
Chloride: 105 mmol/L (ref 98–111)
Creatinine, Ser: 1.01 mg/dL — ABNORMAL HIGH (ref 0.44–1.00)
GFR calc Af Amer: >60 mL/min (ref >60)
GFR calc non Af Amer: 52 mL/min — ABNORMAL LOW (ref >60)
Glucose, Bld: 95 mg/dL (ref 70–99)
Potassium: 3.4 mmol/L — ABNORMAL LOW (ref 3.5–5.1)
Sodium: 138 mmol/L (ref 135–145)

## 2019-09-03 LAB — HEMOGLOBIN A1C
Hgb A1c MFr Bld: 5.3 % (ref 4.8–5.6)
Mean Plasma Glucose: 105.41 mg/dL

## 2019-09-03 MED ORDER — ATORVASTATIN CALCIUM 10 MG PO TABS
10.0000 mg | ORAL_TABLET | Freq: Every morning | ORAL | Status: DC
  Administered 2019-09-03: 10 mg via ORAL
  Filled 2019-09-03: qty 1

## 2019-09-03 MED ORDER — CLOPIDOGREL BISULFATE 75 MG PO TABS
75.0000 mg | ORAL_TABLET | Freq: Every day | ORAL | Status: DC
  Administered 2019-09-03 – 2019-09-05 (×3): 75 mg via ORAL
  Filled 2019-09-03 (×3): qty 1

## 2019-09-03 MED ORDER — LISINOPRIL 5 MG PO TABS
5.0000 mg | ORAL_TABLET | Freq: Every day | ORAL | Status: DC
  Administered 2019-09-03 – 2019-09-05 (×3): 5 mg via ORAL
  Filled 2019-09-03 (×3): qty 1

## 2019-09-03 MED ORDER — ATORVASTATIN CALCIUM 10 MG PO TABS
20.0000 mg | ORAL_TABLET | Freq: Every morning | ORAL | Status: DC
  Administered 2019-09-04 – 2019-09-05 (×2): 20 mg via ORAL
  Filled 2019-09-03 (×2): qty 2

## 2019-09-03 MED ORDER — HEPARIN SODIUM (PORCINE) 5000 UNIT/ML IJ SOLN: 5000.0000 [IU] | Freq: Three times a day (TID) | INTRAMUSCULAR | Status: DC

## 2019-09-03 MED ORDER — ASPIRIN EC 81 MG PO TBEC
81.0000 mg | DELAYED_RELEASE_TABLET | Freq: Every day | ORAL | Status: DC
  Administered 2019-09-03 – 2019-09-05 (×3): 81 mg via ORAL
  Filled 2019-09-03 (×3): qty 1

## 2019-09-03 NOTE — Evaluation (Signed)
Speech Language Pathology Evaluation Patient Details Name: Adriana [name] MRN: [id number] DOB: May 29, 1934 Today's Date: 09/03/2019 Time: 5188-4166 SLP Time Calculation (min) (ACUTE ONLY): 24 min  Problem List:  Patient Active Problem List   Diagnosis Date Noted  . Ischemic stroke (HCC) 09/02/2019  . Impacted cerumen of left ear 07/31/2019  . Abdominal pain 06/25/2019  . Diastolic CHF (HCC) 02/15/2019  . History of CVA (cerebrovascular accident) 01/28/2019  . Pure hypercholesterolemia 01/28/2019  . Pericardial effusion 01/28/2019  . CVA (cerebral vascular accident) (HCC) 12/04/2018  . Weight loss 08/07/2018  . MVP (mitral valve prolapse) 10/15/2017  . Constipation 07/11/2017  . Generalized osteoarthritis of multiple sites 07/11/2017  . Incontinent of urine 07/11/2017  . Osteoporosis 09/04/2012  . Depression with anxiety 09/01/2012  . Hypertension 09/01/2012  . C6 cervical fracture (HCC) 08/01/2012  . C7 cervical fracture (HCC) 08/01/2012  . Diverticulitis of colon 08/01/2012   Past Medical History:  Past Medical History:  Diagnosis Date  . Anginal pain (HCC) 08/24/2010   Echo-EF =>55%,LV normal  . Arthritis   . Cataract 12/2006   Both eyes  Epes MD  . Chest pain, unspecified 09/04/2007   Lexiscan-- EF 72%; LV normal  . Constipation    Takes flax seed and honey .  Smooth Move tea.  . Depression   . Diverticulitis   . Head injury, closed    with fall  . Hypertension   . Mitral valve prolapse   . Osteopenia   . Stroke [facility])    Past Surgical History:  Past Surgical History:  Procedure Laterality Date  . ANTERIOR CERVICAL DECOMP/DISCECTOMY FUSION N/A 10/23/2012   Procedure: Cervical Five to Cervical Six, Cervical Six to Cervical Seven anterior cervical decompression with fusion plating and bonegraft;  Surgeon: Hewitt Shorts, MD;  Location: [facility];  Service: Neurosurgery;  Laterality: N/A;  ANTERIOR CERVICAL DECOMPRESSION/DISCECTOMY FUSION 2 LEVELS  . BREAST CYST  ASPIRATION  1990   benign  . CARDIAC CATHETERIZATION  08/01/2010   normal coronaries  . COLONOSCOPY  approx 2011  . EYE SURGERY Bilateral 2011  . intestinal blockage surgery  Feb. 2012   "kink in small intestine" per pt   HPI:  Pt is an 85 y.o. female with PMH of HTN, MVP, CVA, diastolic CHF, C6-C7 cervical fx, osteoporosis, osteoarthritis, depression with anxiety, and hypercholesterolemia. Presented to ED from [facility] after fall with walker. MRI reveals acute right thalamic lacunar infarct.   Assessment / Plan / Recommendation Clinical Impression  Pt participated in speech/language/cognition evaluation evaluation. Pt reported having some memory "trouble" for the past 3-4 years as well some difficulty "adding and subtracting" but she denied any acute changes in speech, language or cognition. The [facility] Mental Status Examination was completed to evaluate the pt's cognitive-linguistic skills. She achieved a score of 30/30 which is within the normal limits of 27 or more out of 30. No speech or language deficits were noted and her performance on informal cognitive-linguistic tasks was within functional limits. Further skilled SLP services are not clinically indicated at this time. Pt was educated regarding this and she verbalized understanding as well as agreement with plan of care.    SLP Assessment  SLP Recommendation/Assessment: Patient does not need any further Speech Lanaguage Pathology Services SLP Visit Diagnosis: Cognitive communication deficit (R41.841)    Follow Up Recommendations  None    Frequency and Duration           SLP Evaluation Cognition  Overall Cognitive Status:  Within Functional Limits for tasks assessed Arousal/Alertness: Awake/alert Orientation Level: Oriented X4 Attention: Focused;Sustained Focused Attention: Appears intact Sustained Attention: Appears intact Memory: Appears intact (Immediate: 5/5; delayed: 5/5) Awareness: Appears  intact Problem Solving: Appears intact       Comprehension  Auditory Comprehension Overall Auditory Comprehension: Appears within functional limits for tasks assessed Yes/No Questions: Within Functional Limits Commands: Within Functional Limits Conversation: Other (comment) Visual Recognition/Discrimination Discrimination: Within Function Limits Reading Comprehension Reading Status: Within funtional limits    Expression Expression Primary Mode of Expression: Verbal Verbal Expression Overall Verbal Expression: Appears within functional limits for tasks assessed Initiation: No impairment Level of Generative/Spontaneous Verbalization: Conversation Repetition: No impairment Naming: No impairment Pragmatics: No impairment Written Expression Dominant Hand: Right   Oral / Motor  Oral Motor/Sensory Function Overall Oral Motor/Sensory Function: Within functional limits Motor Speech Overall Motor Speech: Appears within functional limits for tasks assessed Respiration: Within functional limits Phonation: Normal Resonance: Within functional limits Articulation: Within functional limitis Intelligibility: Intelligible Motor Planning: Witnin functional limits Motor Speech Errors: Not applicable   Derald Lorge I. Vear Clock, MS, CCC-SLP Acute Rehabilitation Services Office number [phone] Pager [phone]                    Scheryl Marten 09/03/2019, 5:23 PM

## 2019-09-03 NOTE — Evaluation (Signed)
Physical Therapy Evaluation Patient Details Name: Adriana [name] MRN: [id number] DOB: 1934-09-10 Today's Date: 09/03/2019   History of Present Illness  Pt is a 85 y.o. female with PMH of HTN, MVP, CVA, diastolic CHF, C6-C7 cervical fx, osteoporosis, osteoarthritis, depression with anxiety, and hypercholesterolemia. Presented to ED from [facility] after fall with walker. Pt reports she had been feeling unsteady since 2 days prior and was leaning to her left side. MRI reveals acute right thalamic lacunar infarct.  Clinical Impression  Patient presents with decreased mobility due to decreased strength and coordination L UE/LE, decreased balance, decreased activity tolerance and high fall risk.  She was previously in independent living and able to ambulate with RW independently and got meals at facility.  She needs min to mod A for ambulation with RW and will benefit form skilled PT in the acute setting to maximze mobility and safety prior to d/c.  Feel she will need STSNF level rehab prior to return to independent living.     Follow Up Recommendations SNF ([facility])    Equipment Recommendations  None recommended by PT    Recommendations for Other Services       Precautions / Restrictions        Mobility  Bed Mobility Overal bed mobility: Needs Assistance Bed Mobility: Supine to Sit     Supine to sit: Min assist;HOB elevated     General bed mobility comments: pulled up with HHA  Transfers Overall transfer level: Needs assistance Equipment used: Rolling walker (2 wheeled) Transfers: Sit to/from Stand Sit to Stand: Min assist         General transfer comment: placed one hand on bed to push up without cues  Ambulation/Gait Ambulation/Gait assistance: Mod assist Gait Distance (Feet): 70 Feet Assistive device: Rolling walker (2 wheeled) Gait Pattern/deviations: Step-to pattern;Step-through pattern;Decreased stride length;Decreased dorsiflexion - left;Decreased step length -  left     General Gait Details: reports feeling as if L LE isn't stable, noted more stiff leg on L and some LOB with stepping with R mod A for walker management due to L UE pushing walker out and for balance  Stairs            Wheelchair Mobility    Modified Rankin (Stroke Patients Only) Modified Rankin (Stroke Patients Only) Pre-Morbid Rankin Score: Slight disability (did not cook, but did laundry) Modified Rankin: Moderately severe disability     Balance Overall balance assessment: Needs assistance Sitting-balance support: Feet supported Sitting balance-Leahy Scale: Good     Standing balance support: Bilateral upper extremity supported Standing balance-Leahy Scale: Poor Standing balance comment: UE support and min A for balance static standing                             Pertinent Vitals/Pain Pain Assessment: Faces Pain Score: 4  Pain Location: headache Pain Descriptors / Indicators: Headache Pain Intervention(s): Monitored during session;Repositioned    Home Living Family/patient expects to be discharged to:: Private residence Living Arrangements: Alone Available Help at Discharge: Family;Available PRN/intermittently Type of Home: Independent living facility Home Access: Elevator     Home Layout: One level Home Equipment: Walker - 2 wheels;Shower seat;Grab bars - tub/shower;Grab bars - toilet Additional Comments: reports only one fall in 6 months    Prior Function Level of Independence: Independent with assistive device(s)         Comments: Pt walks to meals and has microwave in her room. No longer  driving - daughter drives her to appointments     Hand Dominance   Dominant Hand: Right    Extremity/Trunk Assessment   Upper Extremity Assessment Upper Extremity Assessment: Defer to OT evaluation    Lower Extremity Assessment Lower Extremity Assessment: LLE deficits/detail LLE Deficits / Details: hip flexion 4-/5, knee extension 4+/5,  ankle DF 4-/5 LLE Coordination: decreased gross motor    Cervical / Trunk Assessment Cervical / Trunk Assessment: Kyphotic  Communication   Communication: No difficulties  Cognition Arousal/Alertness: Awake/alert Behavior During Therapy: WFL for tasks assessed/performed Overall Cognitive Status: Within Functional Limits for tasks assessed                                 General Comments: reports had a fall, states after eye appointment had eyes dilated and eyes blurry, then next morning had the fall, knew she was diagnosed with a stroke after admission      General Comments General comments (skin integrity, edema, etc.): ecchymosis on L eye, reports no changes in vision, but had vision affected from last stroke with double vision remaining    Exercises     Assessment/Plan    PT Assessment Patient needs continued PT services  PT Problem List Decreased strength;Decreased mobility;Decreased balance;Decreased knowledge of use of DME;Decreased coordination;Decreased activity tolerance       PT Treatment Interventions DME instruction;Therapeutic activities;Gait training;Therapeutic exercise;Patient/family education;Functional mobility training;Neuromuscular re-education    PT Goals (Current goals can be found in the Care Plan section)  Acute Rehab PT Goals Patient Stated Goal: go home safely PT Goal Formulation: With patient Time For Goal Achievement: 09/17/19 Potential to Achieve Goals: Good    Frequency Min 4X/week   Barriers to discharge        Co-evaluation               AM-PAC PT "6 Clicks" Mobility  Outcome Measure Help needed turning from your back to your side while in a flat bed without using bedrails?: A Little Help needed moving from lying on your back to sitting on the side of a flat bed without using bedrails?: A Little Help needed moving to and from a bed to a chair (including a wheelchair)?: A Little Help needed standing up from a chair  using your arms (e.g., wheelchair or bedside chair)?: A Little Help needed to walk in hospital room?: A Lot Help needed climbing 3-5 steps with a railing? : Total 6 Click Score: 15    End of Session Equipment Utilized During Treatment: Gait belt Activity Tolerance: Patient limited by fatigue Patient left: in chair;with call bell/phone within reach;with chair alarm set   PT Visit Diagnosis: Other abnormalities of gait and mobility (R26.89);History of falling (Z91.81);Other symptoms and signs involving the nervous system (R29.898)    Time: 0950-1020 PT Time Calculation (min) (ACUTE ONLY): 30 min   Charges:   PT Evaluation $PT Eval Moderate Complexity: 1 Mod PT Treatments $Gait Training: 8-22 mins        Sheran Lawless, PT Acute Rehabilitation Services Pager:[phone] [phone] 09/03/2019   Elray Mcgregor 09/03/2019, 3:23 PM

## 2019-09-03 NOTE — Progress Notes (Signed)
Carotid duplex       has been completed. Preliminary results can be found under CV proc through chart review. Teal Raben, BS, RDMS, RVT   

## 2019-09-03 NOTE — Progress Notes (Signed)
PROGRESS NOTE                                                                                                                                                                                                             Patient Demographics:    Adriana [name], is a 85 y.o. female, DOB - 1934-08-20, WUJ:[id number]  Admit date - 09/02/2019   Admitting Physician Ollen Bowl, MD  Outpatient Primary MD for the patient is Mahlon Gammon, MD  LOS - 1   Chief Complaint  Patient presents with  . Fall  . Weakness       Brief Narrative    HPI: Adriana [name] is a 85 y.o. female with medical history significant of hypertension, CVA with some residual blurry vision in September 2020, hyperlipidemia, depression, cervical spondylosis presents to emergency department for evaluation of left-sided weakness since 3 to 4 days and fall this morning.  Patient tells me that since couple of days he has been unsteady and feels as if she is leaning towards left side.  This morning around 6 AM she fell on the ground and hit on her head.  Denies lightheadedness, dizziness, headache, change in vision prior or after the fall.  No history of loss of consciousness, seizure, slurred speech, facial droop.  Reports unsteady gait and left-sided weakness since couple of days.  She was seen by ophthalmologist outpatient for follow-up and her eyes were dilated for examination.  She thought that her unsteadiness is because of eye dilation.  No history of chest pain, shortness of breath, palpitation, leg swelling, fever, chills, cough, congestion, abdominal pain, urinary or bowel changes.  She lives alone at home.  Uses walker for ambulation.  No history of smoking, alcohol, listed drug use.  ED Course: Upon arrival to ED: Patient heart rate in 50s, other vitals within normal limits.  Afebrile with no leukocytosis.  Basic labs such as CBC, CMP, UA: Negative, PT INR, APTT, ethanol, UDS:  Pending.  CT head without contrast obtained which shows no acute intracranial pathology.  Atrophy, signs of prior infarct and evidence of chronic microvascular ischemic changes similar to prior study.  Periorbital hematoma on the left.  EDP consulted neurology recommended MRI brain.  [facility] hospitalist consulted for admission for stroke work-up.   Subjective:    Anisa Leanos today has, No  headache, No chest pain, No abdominal pain - No Nausea, he denies any shortness of breath.    Assessment  & Plan :    Active Problems:   Depression with anxiety   Hypertension   CVA (cerebral vascular accident) (HCC)   Diastolic CHF (HCC)   Ischemic stroke (HCC)   Acute CVA -Has small right thalamic infarct secondary to small vessel disease -MRI is pending. -Carotid Dopplers pending -2D echo with a preserved EF 55 to 60%, with no source of embolus -LDL 73, she is on statin -Hemoglobin  A1c is 5.3 -PT/OT/SLP consulted. -Continue with dual antiplatelet therapy aspirin and Plavix for 3 weeks, then Plavix alone per neuro recommendation.  Left periorbital hematoma: -Status post fall -Reviewed CT head.  MRI maxillofacial is ordered and is pending. -Monitor H&H.  Tylenol as needed for pain control -On fall precautions  Hypertension:  -Symptoms been going on for few days, so allow for lisinopril, Coreg gradually over the next 2 to 3 days, metoprolol remains on hold .  Hyperlipidemia: -LDL is 73, she is on Lipitor 10 mg at home, currently on 20 mg, continue on discharge  Depression/anxiety: Continue Zoloft  Osteopenia/osteoporosis: Continue Fosamax  Chronic diastolic congestive heart failure: Stable -Patient appears euvolemic on exam. -Reviewed echo from 02/11/2019 which showed grade 1 diastolic dysfunction -Patient is not on diuretics. -Continue to monitor.   COVID-19 Labs  No results for input(s): DDIMER, FERRITIN, LDH, CRP in the last 72 hours.  Lab Results  Component Value Date    SARSCOV2NAA NEGATIVE 09/02/2019     Code Status : Full  Family Communication  : None at bedside  Disposition Plan  :  Status is: Inpatient  Remains inpatient appropriate because:Inpatient level of care appropriate due to severity of illness   Dispo: The patient is from: Home              Anticipated d/c is to: SNF              Anticipated d/c date is: 2 days              Patient currently is not medically stable to d/c.       Consults  :  neuro  Procedures  : none  DVT Prophylaxis  :  [place] heparin  Lab Results  Component Value Date   PLT 220 09/03/2019    Antibiotics  :    Anti-infectives (From admission, onward)   None        Objective:   Vitals:   09/03/19 0354 09/03/19 1000 09/03/19 1200 09/03/19 1500  BP: (!) 141/76 134/77 104/82   Pulse: (!) 56 67 68   Resp: 16 17 14    Temp: (!) 97.4 F (36.3 C)   (!) 97.5 F (36.4 C)  TempSrc: Oral   Oral  SpO2: 95% 100% 99%   Weight:      Height:        Wt Readings from Last 3 Encounters:  09/02/19 61.2 kg  08/05/19 60.9 kg  07/30/19 62.9 kg     Intake/Output Summary (Last 24 hours) at 09/03/2019 1713 Last data filed at 09/03/2019 1518 Gross per 24 hour  Intake --  Output 550 ml  Net -550 ml     Physical Exam  Awake Alert, Oriented X 3, No new F.N deficits, Normal affect, with left eye periorbital bruising Symmetrical Chest wall movement, Good air movement bilaterally, CTAB RRR,No Gallops,Rubs or new Murmurs, No Parasternal Heave +ve B.Sounds, Abd Soft, No  tenderness, No rebound - guarding or rigidity. No Cyanosis, Clubbing or edema, No new Rash or bruise      Data Review:    CBC Recent Labs  Lab 09/02/19 1130 09/03/19 0331  WBC 8.1 6.6  HGB 13.5 12.8  HCT 41.6 39.3  PLT 236 220  MCV 94.5 93.8  MCH 30.7 30.5  MCHC 32.5 32.6  RDW 12.4 12.4  LYMPHSABS 1.2 1.4  MONOABS 0.7 0.7  EOSABS 0.2 0.3  BASOSABS 0.1 0.0    Chemistries  Recent Labs  Lab 09/02/19 1130 09/03/19 0331  NA  140 138  K 4.0 3.4*  CL 105 105  CO2 25 24  GLUCOSE 102* 95  BUN 14 16  CREATININE 0.75 1.01*  CALCIUM 10.0 9.5  AST 30  --   ALT 15  --   ALKPHOS 52  --   BILITOT 1.0  --    ------------------------------------------------------------------------------------------------------------------ Recent Labs    09/03/19 0331  CHOL 137  HDL 50  LDLCALC 73  TRIG 71  CHOLHDL 2.7    Lab Results  Component Value Date   HGBA1C 5.3 09/03/2019   ------------------------------------------------------------------------------------------------------------------ No results for input(s): TSH, T4TOTAL, T3FREE, THYROIDAB in the last 72 hours.  Invalid input(s): FREET3 ------------------------------------------------------------------------------------------------------------------ No results for input(s): VITAMINB12, FOLATE, FERRITIN, TIBC, IRON, RETICCTPCT in the last 72 hours.  Coagulation profile Recent Labs  Lab 09/02/19 1320  INR 1.0    No results for input(s): DDIMER in the last 72 hours.  Cardiac Enzymes No results for input(s): CKMB, TROPONINI, MYOGLOBIN in the last 168 hours.  Invalid input(s): CK ------------------------------------------------------------------------------------------------------------------ No results found for: BNP  Inpatient Medications  Scheduled Meds: .  stroke: mapping our early stages of recovery book   Does not apply Once  . aspirin EC  81 mg Oral Daily  . [START ON 09/04/2019] atorvastatin  20 mg Oral q AM  . clopidogrel  75 mg Oral Daily  . lisinopril  5 mg Oral Daily   Continuous Infusions: PRN Meds:.acetaminophen **OR** acetaminophen (TYLENOL) oral liquid 160 mg/5 mL **OR** acetaminophen  Micro Results Recent Results (from the past 240 hour(s))  SARS Coronavirus 2 by RT PCR (hospital order, performed in [facility] hospital lab) Nasopharyngeal Nasopharyngeal Swab     Status: None   Collection Time: 09/02/19  2:05 PM   Specimen:  Nasopharyngeal Swab  Result Value Ref Range Status   SARS Coronavirus 2 NEGATIVE NEGATIVE Final    Comment: (NOTE) SARS-CoV-2 target nucleic acids are NOT DETECTED.  The SARS-CoV-2 RNA is generally detectable in upper and lower respiratory specimens during the acute phase of infection. The lowest concentration of SARS-CoV-2 viral copies this assay can detect is 250 copies / mL. A negative result does not preclude SARS-CoV-2 infection and should not be used as the sole basis for treatment or other patient management decisions.  A negative result may occur with improper specimen collection / handling, submission of specimen other than nasopharyngeal swab, presence of viral mutation(s) within the areas targeted by this assay, and inadequate number of viral copies (<250 copies / mL). A negative result must be combined with clinical observations, patient history, and epidemiological information.  Fact Sheet for Patients:   BoilerBrush.com.cy  Fact Sheet for Healthcare Providers: https://pope.com/  This test is not yet approved or  cleared by the [place] FDA and has been authorized for detection and/or diagnosis of SARS-CoV-2 by FDA under an Emergency Use Authorization (EUA).  This EUA will remain in effect (meaning this test can  be used) for the duration of the COVID-19 declaration under Section 564(b)(1) of the Act, 21 U.S.C. section 360bbb-3(b)(1), unless the authorization is terminated or revoked sooner.  Performed at [facility], [address] [place], [place] [address]     Radiology Reports CT Head Wo Contrast  Result Date: 09/02/2019 CLINICAL DATA:  Facial trauma, hematoma above the LEFT eye EXAM: CT HEAD WITHOUT CONTRAST CT MAXILLOFACIAL WITHOUT CONTRAST TECHNIQUE: Multidetector CT imaging of the head and maxillofacial structures were performed using the standard protocol without intravenous contrast. Multiplanar  CT image reconstructions of the maxillofacial structures were also generated. COMPARISON:  MRI brain of 12/03/2018 FINDINGS: CT HEAD FINDINGS Brain: No evidence of acute infarction, hemorrhage, hydrocephalus, extra-axial collection or mass lesion/mass effect. Signs of atrophy, chronic microvascular ischemic change in deep and periventricular white matter and of prior infarct. Vascular: No hyperdense vessel or unexpected calcification. Skull: Normal. Negative for fracture or focal lesion. Other: LEFT supraorbital/frontal hematoma along the lateral margin of the LEFT orbit, see below. CT MAXILLOFACIAL FINDINGS Osseous: No fracture or mandibular dislocation. No destructive process. Orbits: Periorbital stranding on the LEFT mainly along the lateral and superior margin of the orbit. No retro-orbital fat stranding. Sinuses: Clear. Soft tissues: Periorbital hematoma on the LEFT. IMPRESSION: 1. No acute intracranial pathology. Atrophy, signs of prior infarct and evidence of chronic microvascular ischemic change similar to the prior study. 2. No acute facial bone fracture. 3. Periorbital hematoma on the LEFT mainly along the lateral and superior margin of the orbit. No retro-orbital fat stranding. Electronically Signed   By: Donzetta Kohut M.D.   On: 09/02/2019 12:00   MR BRAIN WO CONTRAST  Result Date: 09/02/2019 CLINICAL DATA:  Fall EXAM: MRI HEAD WITHOUT CONTRAST TECHNIQUE: Multiplanar, multiecho pulse sequences of the brain and surrounding structures were obtained without intravenous contrast. COMPARISON:  12/03/2018 FINDINGS: Brain: Small subcentimeter focus of mildly reduced diffusion is present at the lateral aspect of the right thalamus. There is no intracranial mass or significant mass effect. There is no hydrocephalus or extra-axial fluid collection. Prominence of the ventricles and sulci reflects stable parenchymal volume loss. Patchy and confluent areas of T2 hyperintensity in the supratentorial and pontine  white matter are nonspecific but probably reflect moderate to advanced chronic microvascular ischemic changes. There is a chronic infarct of the left occipital lobe. Punctate focus of susceptibility hypointensity at the posterior left thalamus is compatible with chronic microhemorrhage or less likely mineralization. Vascular: Major vessel flow voids at the skull base are preserved. Skull and upper cervical spine: Normal marrow signal is preserved. Sinuses/Orbits: Minor mucosal thickening. Bilateral lens replacements. Other: Sella is unremarkable.  Mastoid air cells are clear. IMPRESSION: Small acute infarct of the lateral right thalamus. Moderate to advanced chronic microvascular ischemic changes. Chronic left occipital infarct. Electronically Signed   By: Guadlupe Spanish M.D.   On: 09/02/2019 12:50   ECHOCARDIOGRAM COMPLETE  Result Date: 09/02/2019    ECHOCARDIOGRAM REPORT   Patient Name:   Adriana [name] Date of Exam: 09/02/2019 Medical Rec #:  [id number]   Height:       66.0 in Accession #:    [id number]  Weight:       135.0 lb Date of Birth:  [birth year]   BSA:          1.692 m Patient Age:    84 years    BP:           149/78 mmHg Patient Gender: F  HR:           52 bpm. Exam Location:  Inpatient Procedure: 2D Echo, Cardiac Doppler and Color Doppler Indications:    Stroke 434.91 / I163.9  History:        Patient has prior history of Echocardiogram examinations, most                 recent 02/11/2019. CHF, Stroke, Mitral Valve Prolapse; Risk                 Factors:Hypertension, Dyslipidemia and Non-Smoker. Pericardial                 effusion.  Sonographer:    Renella CunasJulia Swaim RDCS Referring Phys: [id number] Kasandra KnudsenRINKA R PAHWANI IMPRESSIONS  1. Left ventricular ejection fraction, by estimation, is 55 to 60%. The left ventricle has normal function. The left ventricle has no regional wall motion abnormalities. Left ventricular diastolic parameters are consistent with Grade I diastolic dysfunction (impaired relaxation).   2. Right ventricular systolic function is normal. The right ventricular size is normal. There is normal pulmonary artery systolic pressure.  3. No evidence of mitral valve prolapse. The mitral valve is normal in structure. Trivial mitral valve regurgitation. No evidence of mitral stenosis.  4. The aortic valve is tricuspid. Aortic valve regurgitation is not visualized. No aortic stenosis is present.  5. The inferior vena cava is normal in size with greater than 50% respiratory variability, suggesting right atrial pressure of 3 mmHg. FINDINGS  Left Ventricle: Left ventricular ejection fraction, by estimation, is 55 to 60%. The left ventricle has normal function. The left ventricle has no regional wall motion abnormalities. The left ventricular internal cavity size was normal in size. There is  no left ventricular hypertrophy. Left ventricular diastolic parameters are consistent with Grade I diastolic dysfunction (impaired relaxation). Indeterminate filling pressures. Right Ventricle: The right ventricular size is normal. No increase in right ventricular wall thickness. Right ventricular systolic function is normal. There is normal pulmonary artery systolic pressure. The tricuspid regurgitant velocity is 2.01 m/s, and  with an assumed right atrial pressure of 3 mmHg, the estimated right ventricular systolic pressure is 19.2 mmHg. Left Atrium: Left atrial size was normal in size. Right Atrium: Right atrial size was normal in size. Pericardium: A small pericardial effusion is present. The pericardial effusion is circumferential. Mitral Valve: No evidence of mitral valve prolapse. The mitral valve is normal in structure. Normal mobility of the mitral valve leaflets. Trivial mitral valve regurgitation. No evidence of mitral valve stenosis. Tricuspid Valve: The tricuspid valve is normal in structure. Tricuspid valve regurgitation is mild . No evidence of tricuspid stenosis. Aortic Valve: The aortic valve is tricuspid.  Aortic valve regurgitation is not visualized. No aortic stenosis is present. Pulmonic Valve: The pulmonic valve was normal in structure. Pulmonic valve regurgitation is not visualized. No evidence of pulmonic stenosis. Aorta: The aortic root is normal in size and structure. Venous: The inferior vena cava is normal in size with greater than 50% respiratory variability, suggesting right atrial pressure of 3 mmHg. IAS/Shunts: No atrial level shunt detected by color flow Doppler.  LEFT VENTRICLE PLAX 2D LVIDd:         4.50 cm     Diastology LVIDs:         3.10 cm     LV e' lateral:   7.62 cm/s LV PW:         0.80 cm     LV E/e' lateral: 7.7 LV IVS:  0.80 cm     LV e' medial:    5.68 cm/s LVOT diam:     1.80 cm     LV E/e' medial:  10.3 LV SV:         56 LV SV Index:   33 LVOT Area:     2.54 cm  LV Volumes (MOD) LV vol d, MOD A2C: 68.4 ml LV vol d, MOD A4C: 61.7 ml LV vol s, MOD A2C: 31.0 ml LV vol s, MOD A4C: 29.7 ml LV SV MOD A2C:     37.4 ml LV SV MOD A4C:     61.7 ml LV SV MOD BP:      36.2 ml RIGHT VENTRICLE RV S prime:     10.10 cm/s TAPSE (M-mode): 2.0 cm LEFT ATRIUM             Index       RIGHT ATRIUM           Index LA diam:        3.70 cm 2.19 cm/m  RA Area:     12.40 cm LA Vol (A2C):   26.9 ml 15.90 ml/m RA Volume:   27.60 ml  16.31 ml/m LA Vol (A4C):   27.4 ml 16.19 ml/m LA Biplane Vol: 29.8 ml 17.61 ml/m  AORTIC VALVE LVOT Vmax:   114.00 cm/s LVOT Vmean:  68.800 cm/s LVOT VTI:    0.222 m  AORTA Ao Root diam: 2.60 cm MITRAL VALVE               TRICUSPID VALVE MV Area (PHT): 1.94 cm    TR Peak grad:   16.2 mmHg MV Decel Time: 391 msec    TR Vmax:        201.00 cm/s MV E velocity: 58.50 cm/s MV A velocity: 59.70 cm/s  SHUNTS MV E/A ratio:  0.98        Systemic VTI:  0.22 m                            Systemic Diam: 1.80 cm Chilton Si MD Electronically signed by Chilton Si MD Signature Date/Time: 09/02/2019/5:36:33 PM    Final    VAS US CAROTID  Result Date: 09/03/2019 Carotid  Arterial Duplex Study Indications:       CVA. Risk Factors:      Hypertension. Other Factors:     Acute infarct of the lateral right thalamus. Comparison Study:  none Performing Technologist: Jeb Levering RDMS, RVT  Examination Guidelines: A complete evaluation includes B-mode imaging, spectral Doppler, color Doppler, and power Doppler as needed of all accessible portions of each vessel. Bilateral testing is considered an integral part of a complete examination. Limited examinations for reoccurring indications may be performed as noted.  Right Carotid Findings: +----------+--------+--------+--------+------------------+--------+           PSV cm/sEDV cm/sStenosisPlaque DescriptionComments +----------+--------+--------+--------+------------------+--------+ CCA Prox  49      13                                         +----------+--------+--------+--------+------------------+--------+ CCA Distal45      12                                         +----------+--------+--------+--------+------------------+--------+  ICA Prox  36      11                                         +----------+--------+--------+--------+------------------+--------+ ICA Distal92      25                                         +----------+--------+--------+--------+------------------+--------+ ECA       54      10                                         +----------+--------+--------+--------+------------------+--------+ +----------+--------+-------+----------------+-------------------+           PSV cm/sEDV cmsDescribe        Arm Pressure (mmHG) +----------+--------+-------+----------------+-------------------+ [id number]             Multiphasic, WNL                    +----------+--------+-------+----------------+-------------------+ +---------+--------+--+--------+--+---------+ VertebralPSV cm/s34EDV cm/s11Antegrade +---------+--------+--+--------+--+---------+  Left Carotid Findings:  +----------+--------+--------+--------+------------------+--------+           PSV cm/sEDV cm/sStenosisPlaque DescriptionComments +----------+--------+--------+--------+------------------+--------+ CCA Prox  65      18                                         +----------+--------+--------+--------+------------------+--------+ CCA Distal41      10                                         +----------+--------+--------+--------+------------------+--------+ ICA Prox  39      14                                         +----------+--------+--------+--------+------------------+--------+ ICA Distal63      23                                         +----------+--------+--------+--------+------------------+--------+ ECA       54      5                                          +----------+--------+--------+--------+------------------+--------+ +----------+--------+--------+----------------+-------------------+           PSV cm/sEDV cm/sDescribe        Arm Pressure (mmHG) +----------+--------+--------+----------------+-------------------+ [id number]              Multiphasic, WNL                    +----------+--------+--------+----------------+-------------------+ +---------+--------+--+--------+--+---------+ VertebralPSV cm/s42EDV cm/s15Antegrade +---------+--------+--+--------+--+---------+   Summary: Right Carotid: The extracranial vessels were near-normal with only minimal wall                thickening or plaque. Left Carotid: The extracranial vessels  were near-normal with only minimal wall               thickening or plaque.  *See table(s) above for measurements and observations.  Electronically signed by Lemar Livings MD on 09/03/2019 at 4:31:33 PM.    Final    CT Maxillofacial Wo Contrast  Result Date: 09/02/2019 CLINICAL DATA:  Facial trauma, hematoma above the LEFT eye EXAM: CT HEAD WITHOUT CONTRAST CT MAXILLOFACIAL WITHOUT CONTRAST TECHNIQUE: Multidetector  CT imaging of the head and maxillofacial structures were performed using the standard protocol without intravenous contrast. Multiplanar CT image reconstructions of the maxillofacial structures were also generated. COMPARISON:  MRI brain of 12/03/2018 FINDINGS: CT HEAD FINDINGS Brain: No evidence of acute infarction, hemorrhage, hydrocephalus, extra-axial collection or mass lesion/mass effect. Signs of atrophy, chronic microvascular ischemic change in deep and periventricular white matter and of prior infarct. Vascular: No hyperdense vessel or unexpected calcification. Skull: Normal. Negative for fracture or focal lesion. Other: LEFT supraorbital/frontal hematoma along the lateral margin of the LEFT orbit, see below. CT MAXILLOFACIAL FINDINGS Osseous: No fracture or mandibular dislocation. No destructive process. Orbits: Periorbital stranding on the LEFT mainly along the lateral and superior margin of the orbit. No retro-orbital fat stranding. Sinuses: Clear. Soft tissues: Periorbital hematoma on the LEFT. IMPRESSION: 1. No acute intracranial pathology. Atrophy, signs of prior infarct and evidence of chronic microvascular ischemic change similar to the prior study. 2. No acute facial bone fracture. 3. Periorbital hematoma on the LEFT mainly along the lateral and superior margin of the orbit. No retro-orbital fat stranding. Electronically Signed   By: Donzetta Kohut M.D.   On: 09/02/2019 12:00      Huey Bienenstock M.D on 09/03/2019 at 5:13 PM    [facility] -  Office  [phone]

## 2019-09-03 NOTE — [place] FL2 (Signed)
[place] MEDICAID FL2 LEVEL OF CARE SCREENING TOOL     IDENTIFICATION  Patient Name: Adriana [name] Birthdate: 03/28/1934 Sex: female Admission Date (Current Location): 09/02/2019  [facility] and [place] Number:  Producer, television/film/video and Address:  [facility], [address], [place], [place] [address]      Provider Number: [id number]  Attending Physician Name and Address:  Elgergawy, Leana Roe, MD  Relative Name and Phone Number:       Current Level of Care: Hospital Recommended Level of Care: Skilled Nursing Facility Prior Approval Number:    Date Approved/Denied:   PASRR Number:    Discharge Plan: SNF    Current Diagnoses: Patient Active Problem List   Diagnosis Date Noted   Ischemic stroke (HCC) 09/02/2019   Impacted cerumen of left ear 07/31/2019   Abdominal pain 06/25/2019   Diastolic CHF (HCC) 02/15/2019   History of CVA (cerebrovascular accident) 01/28/2019   Pure hypercholesterolemia 01/28/2019   Pericardial effusion 01/28/2019   CVA (cerebral vascular accident) (HCC) 12/04/2018   Weight loss 08/07/2018   MVP (mitral valve prolapse) 10/15/2017   Constipation 07/11/2017   Generalized osteoarthritis of multiple sites 07/11/2017   Incontinent of urine 07/11/2017   Osteoporosis 09/04/2012   Depression with anxiety 09/01/2012   Hypertension 09/01/2012   C6 cervical fracture (HCC) 08/01/2012   C7 cervical fracture (HCC) 08/01/2012   Diverticulitis of colon 08/01/2012    Orientation RESPIRATION BLADDER Height & Weight     Self, Time, Situation, Place  Normal Continent Weight: 135 lb (61.2 kg) Height:  5\' 6"  (167.6 cm)  BEHAVIORAL SYMPTOMS/MOOD NEUROLOGICAL BOWEL NUTRITION STATUS      Continent Diet (See DC summary)  AMBULATORY STATUS COMMUNICATION OF NEEDS Skin   Extensive Assist   Normal                       Personal Care Assistance Level of Assistance  Bathing, Dressing, Feeding Bathing Assistance:  Limited assistance   Dressing Assistance: Limited assistance     Functional Limitations Info  Sight, Speech, Hearing Sight Info: Adequate Hearing Info: Adequate Speech Info: Adequate    SPECIAL CARE FACTORS FREQUENCY  PT (By licensed PT), OT (By licensed OT)     PT Frequency: 5X/week OT Frequency: 5x/week            Contractures Contractures Info: Not present    Additional Factors Info  Code Status, Allergies Code Status Info: Full Allergies Info: Contrast Media (Iodinated Diagnostic Agents), Oxycodone, Boniva (Ibandronic Acid), Codeine, Darvocet (Propoxyphene N-acetaminophen), Erythromycin           Current Medications (09/03/2019):  This is the current hospital active medication list Current Facility-Administered Medications  Medication Dose Route Frequency Provider Last Rate Last Admin    stroke: mapping our early stages of recovery book   Does not apply Once Pahwani, Rinka R, MD       acetaminophen (TYLENOL) tablet 650 mg  650 mg Oral Q4H PRN Pahwani, Rinka R, MD   650 mg at 09/02/19 2233   Or   acetaminophen (TYLENOL) 160 MG/5ML solution 650 mg  650 mg Per Tube Q4H PRN Pahwani, Rinka R, MD       Or   acetaminophen (TYLENOL) suppository 650 mg  650 mg Rectal Q4H PRN Pahwani, Rinka R, MD       aspirin EC tablet 81 mg  81 mg Oral Daily 2234, MD   81 mg at 09/03/19 1056   [  START ON 09/04/2019] atorvastatin (LIPITOR) tablet 20 mg  20 mg Oral q AM Marvel Plan, MD       clopidogrel (PLAVIX) tablet 75 mg  75 mg Oral Daily Elgergawy, Leana Roe, MD   75 mg at 09/03/19 1055   lisinopril (ZESTRIL) tablet 5 mg  5 mg Oral Daily Elgergawy, Leana Roe, MD   5 mg at 09/03/19 1056     Discharge Medications: Please see discharge summary for a list of discharge medications.  Relevant Imaging Results:  Relevant Lab Results:   Additional Information ssn: [id number]  Coralyn Helling, LCSW

## 2019-09-03 NOTE — TOC Initial Note (Signed)
Transition of Care [facility]) - Initial/Assessment Note    Patient Details  Name: Adriana [name] MRN: [id number] Date of Birth: 02/22/1935  Transition [facility]) CM/SW Contact:    Coralyn Helling, LCSW Phone Number: 09/03/2019, 3:08 PM  Clinical Narrative:           Patient from [facility] Independent living. Plan to dc to SNF. Katie at reports that [facility] and [facility] does not have any medicare SNF beds and asked that patient be faxed out to [facility].  Work up complete and patient faxed to [facility]. Florentina Addison has a private pay bed at [facility] if patient wants to forgo her medicare benefits.       Expected Discharge Plan: Skilled Nursing Facility Barriers to Discharge: Continued Medical Work up   Patient Goals and CMS Choice Patient states their goals for this hospitalization and ongoing recovery are:: Get better and go home. CMS Medicare.gov Compare Post Acute Care list provided to:: Patient    Expected Discharge Plan and Services Expected Discharge Plan: Skilled Nursing Facility In-house Referral: NA Discharge Planning Services: NA Post Acute Care Choice: Skilled Nursing Facility Living arrangements for the past 2 months: Independent Living Facility                 DME Arranged: N/A DME Agency: NA       HH Arranged: NA HH Agency: NA        Prior Living Arrangements/Services Living arrangements for the past 2 months: Independent Living Facility Lives with:: Facility Resident Patient language and need for interpreter reviewed:: No Do you feel safe going back to the place where you live?: No   Patient concerned about current level of mobility.  Need for Family Participation in Patient Care: No (Comment) Care giver support system in place?: Yes (comment) Current home services: DME Criminal Activity/Legal Involvement Pertinent to Current Situation/Hospitalization: No - Comment as needed  Activities of Daily Living      Permission  Sought/Granted Permission sought to share information with : Oceanographer granted to share information with : Yes, Verbal Permission Granted     Permission granted to share info w AGENCY: [facility])        Emotional Assessment Appearance:: Appears stated age Attitude/Demeanor/Rapport: Engaged Affect (typically observed): Pleasant Orientation: : Oriented to Self, Oriented to Place, Oriented to  Time, Oriented to Situation Alcohol / Substance Use: Not Applicable Psych Involvement: No (comment)  Admission diagnosis:  Fall, initial encounter [W19.XXXA] Periorbital hematoma of left eye [H05.232] Ischemic stroke (HCC) [I63.9] Cerebrovascular accident (CVA), unspecified mechanism (HCC) [I63.9] Patient Active Problem List   Diagnosis Date Noted   Ischemic stroke (HCC) 09/02/2019   Impacted cerumen of left ear 07/31/2019   Abdominal pain 06/25/2019   Diastolic CHF (HCC) 02/15/2019   History of CVA (cerebrovascular accident) 01/28/2019   Pure hypercholesterolemia 01/28/2019   Pericardial effusion 01/28/2019   CVA (cerebral vascular accident) (HCC) 12/04/2018   Weight loss 08/07/2018   MVP (mitral valve prolapse) 10/15/2017   Constipation 07/11/2017   Generalized osteoarthritis of multiple sites 07/11/2017   Incontinent of urine 07/11/2017   Osteoporosis 09/04/2012   Depression with anxiety 09/01/2012   Hypertension 09/01/2012   C6 cervical fracture (HCC) 08/01/2012   C7 cervical fracture (HCC) 08/01/2012   Diverticulitis of colon 08/01/2012   PCP:  Mahlon Gammon, MD Pharmacy:   [facility] [place], [place] - [place] [address] [place]  [address] Phone: [phone] Fax: [phone]     Social Determinants of Health (SDOH) Interventions    Readmission Risk Interventions Readmission Risk Prevention Plan 09/03/2019  Transportation Screening Complete  Some  recent data might be hidden

## 2019-09-03 NOTE — Progress Notes (Signed)
STROKE TEAM PROGRESS NOTE   INTERVAL HISTORY No family at bedside. Pt sitting in chair, AAO x 3. Has left frontal and periorbital ecchymosis due to fall. Still has mild left hemiparesis. MRI showed right thalamic infarct. She had left occipital infarct in 11/2018 which caused her right hemianopia but improved after. She takes ASA at home.   Vitals:   09/03/19 0200 09/03/19 0216 09/03/19 0236 09/03/19 0354  BP: 134/87  (!) 148/81 (!) 141/76  Pulse: 71 (!) 58 (!) 54 (!) 56  Resp: 13 (!) 23 13 16   Temp:  98 F (36.7 C) 97.7 F (36.5 C) (!) 97.4 F (36.3 C)  TempSrc:  Oral Oral Oral  SpO2: 100% 97% 97% 95%  Weight:      Height:        CBC:  Recent Labs  Lab 09/02/19 1130 09/03/19 0331  WBC 8.1 6.6  NEUTROABS 6.0 4.1  HGB 13.5 12.8  HCT 41.6 39.3  MCV 94.5 93.8  PLT 236 220    Basic Metabolic Panel:  Recent Labs  Lab 09/02/19 1130 09/03/19 0331  NA 140 138  K 4.0 3.4*  CL 105 105  CO2 25 24  GLUCOSE 102* 95  BUN 14 16  CREATININE 0.75 1.01*  CALCIUM 10.0 9.5   Lipid Panel:     Component Value Date/Time   CHOL 137 09/03/2019 0331   TRIG 71 09/03/2019 0331   HDL 50 09/03/2019 0331   CHOLHDL 2.7 09/03/2019 0331   VLDL 14 09/03/2019 0331   LDLCALC 73 09/03/2019 0331   LDLCALC 69 06/30/2019 0715   HgbA1c:  Lab Results  Component Value Date   HGBA1C 5.3 09/03/2019   Urine Drug Screen:     Component Value Date/Time   LABOPIA NONE DETECTED 09/02/2019 2055   COCAINSCRNUR NONE DETECTED 09/02/2019 2055   LABBENZ NONE DETECTED 09/02/2019 2055   AMPHETMU NONE DETECTED 09/02/2019 2055   THCU NONE DETECTED 09/02/2019 2055   LABBARB NONE DETECTED 09/02/2019 2055    Alcohol Level     Component Value Date/Time   ETH <10 09/02/2019 1400    IMAGING past 24 hours CT Head Wo Contrast  Result Date: 09/02/2019 CLINICAL DATA:  Facial trauma, hematoma above the LEFT eye EXAM: CT HEAD WITHOUT CONTRAST CT MAXILLOFACIAL WITHOUT CONTRAST TECHNIQUE: Multidetector CT  imaging of the head and maxillofacial structures were performed using the standard protocol without intravenous contrast. Multiplanar CT image reconstructions of the maxillofacial structures were also generated. COMPARISON:  MRI brain of 12/03/2018 FINDINGS: CT HEAD FINDINGS Brain: No evidence of acute infarction, hemorrhage, hydrocephalus, extra-axial collection or mass lesion/mass effect. Signs of atrophy, chronic microvascular ischemic change in deep and periventricular white matter and of prior infarct. Vascular: No hyperdense vessel or unexpected calcification. Skull: Normal. Negative for fracture or focal lesion. Other: LEFT supraorbital/frontal hematoma along the lateral margin of the LEFT orbit, see below. CT MAXILLOFACIAL FINDINGS Osseous: No fracture or mandibular dislocation. No destructive process. Orbits: Periorbital stranding on the LEFT mainly along the lateral and superior margin of the orbit. No retro-orbital fat stranding. Sinuses: Clear. Soft tissues: Periorbital hematoma on the LEFT. IMPRESSION: 1. No acute intracranial pathology. Atrophy, signs of prior infarct and evidence of chronic microvascular ischemic change similar to the prior study. 2. No acute facial bone fracture. 3. Periorbital hematoma on the LEFT mainly along the lateral and superior margin of the orbit. No retro-orbital fat stranding. Electronically Signed   By: 12/05/2018 M.D.   On: 09/02/2019 12:00  MR BRAIN WO CONTRAST  Result Date: 09/02/2019 CLINICAL DATA:  Fall EXAM: MRI HEAD WITHOUT CONTRAST TECHNIQUE: Multiplanar, multiecho pulse sequences of the brain and surrounding structures were obtained without intravenous contrast. COMPARISON:  12/03/2018 FINDINGS: Brain: Small subcentimeter focus of mildly reduced diffusion is present at the lateral aspect of the right thalamus. There is no intracranial mass or significant mass effect. There is no hydrocephalus or extra-axial fluid collection. Prominence of the ventricles  and sulci reflects stable parenchymal volume loss. Patchy and confluent areas of T2 hyperintensity in the supratentorial and pontine white matter are nonspecific but probably reflect moderate to advanced chronic microvascular ischemic changes. There is a chronic infarct of the left occipital lobe. Punctate focus of susceptibility hypointensity at the posterior left thalamus is compatible with chronic microhemorrhage or less likely mineralization. Vascular: Major vessel flow voids at the skull base are preserved. Skull and upper cervical spine: Normal marrow signal is preserved. Sinuses/Orbits: Minor mucosal thickening. Bilateral lens replacements. Other: Sella is unremarkable.  Mastoid air cells are clear. IMPRESSION: Small acute infarct of the lateral right thalamus. Moderate to advanced chronic microvascular ischemic changes. Chronic left occipital infarct. Electronically Signed   By: Guadlupe Spanish M.D.   On: 09/02/2019 12:50   ECHOCARDIOGRAM COMPLETE  Result Date: 09/02/2019    ECHOCARDIOGRAM REPORT   Patient Name:   Adriana [name] Date of Exam: 09/02/2019 Medical Rec #:  [id number]   Height:       66.0 in Accession #:    [id number]  Weight:       135.0 lb Date of Birth:  17-Apr-1934   BSA:          1.692 m Patient Age:    85 years    BP:           149/78 mmHg Patient Gender: F           HR:           52 bpm. Exam Location:  Inpatient Procedure: 2D Echo, Cardiac Doppler and Color Doppler Indications:    Stroke 434.91 / I163.9  History:        Patient has prior history of Echocardiogram examinations, most                 recent 02/11/2019. CHF, Stroke, Mitral Valve Prolapse; Risk                 Factors:Hypertension, Dyslipidemia and Non-Smoker. Pericardial                 effusion.  Sonographer:    Renella Cunas RDCS Referring Phys: [id number] Kasandra Knudsen PAHWANI IMPRESSIONS  1. Left ventricular ejection fraction, by estimation, is 55 to 60%. The left ventricle has normal function. The left ventricle has no regional wall  motion abnormalities. Left ventricular diastolic parameters are consistent with Grade I diastolic dysfunction (impaired relaxation).  2. Right ventricular systolic function is normal. The right ventricular size is normal. There is normal pulmonary artery systolic pressure.  3. No evidence of mitral valve prolapse. The mitral valve is normal in structure. Trivial mitral valve regurgitation. No evidence of mitral stenosis.  4. The aortic valve is tricuspid. Aortic valve regurgitation is not visualized. No aortic stenosis is present.  5. The inferior vena cava is normal in size with greater than 50% respiratory variability, suggesting right atrial pressure of 3 mmHg. FINDINGS  Left Ventricle: Left ventricular ejection fraction, by estimation, is 55 to 60%. The left ventricle has normal function. The left  ventricle has no regional wall motion abnormalities. The left ventricular internal cavity size was normal in size. There is  no left ventricular hypertrophy. Left ventricular diastolic parameters are consistent with Grade I diastolic dysfunction (impaired relaxation). Indeterminate filling pressures. Right Ventricle: The right ventricular size is normal. No increase in right ventricular wall thickness. Right ventricular systolic function is normal. There is normal pulmonary artery systolic pressure. The tricuspid regurgitant velocity is 2.01 m/s, and  with an assumed right atrial pressure of 3 mmHg, the estimated right ventricular systolic pressure is 19.2 mmHg. Left Atrium: Left atrial size was normal in size. Right Atrium: Right atrial size was normal in size. Pericardium: A small pericardial effusion is present. The pericardial effusion is circumferential. Mitral Valve: No evidence of mitral valve prolapse. The mitral valve is normal in structure. Normal mobility of the mitral valve leaflets. Trivial mitral valve regurgitation. No evidence of mitral valve stenosis. Tricuspid Valve: The tricuspid valve is normal in  structure. Tricuspid valve regurgitation is mild . No evidence of tricuspid stenosis. Aortic Valve: The aortic valve is tricuspid. Aortic valve regurgitation is not visualized. No aortic stenosis is present. Pulmonic Valve: The pulmonic valve was normal in structure. Pulmonic valve regurgitation is not visualized. No evidence of pulmonic stenosis. Aorta: The aortic root is normal in size and structure. Venous: The inferior vena cava is normal in size with greater than 50% respiratory variability, suggesting right atrial pressure of 3 mmHg. IAS/Shunts: No atrial level shunt detected by color flow Doppler.  LEFT VENTRICLE PLAX 2D LVIDd:         4.50 cm     Diastology LVIDs:         3.10 cm     LV e' lateral:   7.62 cm/s LV PW:         0.80 cm     LV E/e' lateral: 7.7 LV IVS:        0.80 cm     LV e' medial:    5.68 cm/s LVOT diam:     1.80 cm     LV E/e' medial:  10.3 LV SV:         56 LV SV Index:   33 LVOT Area:     2.54 cm  LV Volumes (MOD) LV vol d, MOD A2C: 68.4 ml LV vol d, MOD A4C: 61.7 ml LV vol s, MOD A2C: 31.0 ml LV vol s, MOD A4C: 29.7 ml LV SV MOD A2C:     37.4 ml LV SV MOD A4C:     61.7 ml LV SV MOD BP:      36.2 ml RIGHT VENTRICLE RV S prime:     10.10 cm/s TAPSE (M-mode): 2.0 cm LEFT ATRIUM             Index       RIGHT ATRIUM           Index LA diam:        3.70 cm 2.19 cm/m  RA Area:     12.40 cm LA Vol (A2C):   26.9 ml 15.90 ml/m RA Volume:   27.60 ml  16.31 ml/m LA Vol (A4C):   27.4 ml 16.19 ml/m LA Biplane Vol: 29.8 ml 17.61 ml/m  AORTIC VALVE LVOT Vmax:   114.00 cm/s LVOT Vmean:  68.800 cm/s LVOT VTI:    0.222 m  AORTA Ao Root diam: 2.60 cm MITRAL VALVE               TRICUSPID VALVE  MV Area (PHT): 1.94 cm    TR Peak grad:   16.2 mmHg MV Decel Time: 391 msec    TR Vmax:        201.00 cm/s MV E velocity: 58.50 cm/s MV A velocity: 59.70 cm/s  SHUNTS MV E/A ratio:  0.98        Systemic VTI:  0.22 m                            Systemic Diam: 1.80 cm Chilton Si MD Electronically signed by  Chilton Si MD Signature Date/Time: 09/02/2019/5:36:33 PM    Final    CT Maxillofacial Wo Contrast  Result Date: 09/02/2019 CLINICAL DATA:  Facial trauma, hematoma above the LEFT eye EXAM: CT HEAD WITHOUT CONTRAST CT MAXILLOFACIAL WITHOUT CONTRAST TECHNIQUE: Multidetector CT imaging of the head and maxillofacial structures were performed using the standard protocol without intravenous contrast. Multiplanar CT image reconstructions of the maxillofacial structures were also generated. COMPARISON:  MRI brain of 12/03/2018 FINDINGS: CT HEAD FINDINGS Brain: No evidence of acute infarction, hemorrhage, hydrocephalus, extra-axial collection or mass lesion/mass effect. Signs of atrophy, chronic microvascular ischemic change in deep and periventricular white matter and of prior infarct. Vascular: No hyperdense vessel or unexpected calcification. Skull: Normal. Negative for fracture or focal lesion. Other: LEFT supraorbital/frontal hematoma along the lateral margin of the LEFT orbit, see below. CT MAXILLOFACIAL FINDINGS Osseous: No fracture or mandibular dislocation. No destructive process. Orbits: Periorbital stranding on the LEFT mainly along the lateral and superior margin of the orbit. No retro-orbital fat stranding. Sinuses: Clear. Soft tissues: Periorbital hematoma on the LEFT. IMPRESSION: 1. No acute intracranial pathology. Atrophy, signs of prior infarct and evidence of chronic microvascular ischemic change similar to the prior study. 2. No acute facial bone fracture. 3. Periorbital hematoma on the LEFT mainly along the lateral and superior margin of the orbit. No retro-orbital fat stranding. Electronically Signed   By: Donzetta Kohut M.D.   On: 09/02/2019 12:00    PHYSICAL EXAM  Temp:  [97.4 F (36.3 C)-98.7 F (37.1 C)] 97.4 F (36.3 C) (07/01 0354) Pulse Rate:  [50-71] 56 (07/01 0354) Resp:  [12-23] 16 (07/01 0354) BP: (133-152)/(68-90) 141/76 (07/01 0354) SpO2:  [95 %-100 %] 95 % (07/01  0354)  General - Well nourished, well developed, in no apparent distress.  Left frontal and periorbital ecchymosis.    Ophthalmologic - fundi not visualized due to noncooperation.  Cardiovascular - Regular rhythm and rate.  Mental Status -  Level of arousal and orientation to time, place, and person were intact. Language including expression, naming, repetition, comprehension was assessed and found intact. Fund of Knowledge was assessed and was intact.  Cranial Nerves II - XII - II - Visual field intact OU. III, IV, VI - Extraocular movements intact. V - Facial sensation intact bilaterally. VII - Facial movement intact bilaterally. VIII - Hearing & vestibular intact bilaterally. X - Palate elevates symmetrically. XI - Chin turning & shoulder shrug intact bilaterally. XII - Tongue protrusion intact.  Motor Strength - The patient's strength was normal in all extremities and pronator drift was absent except left foot DF/PF 4/5.  Bulk was normal and fasciculations were absent.   Motor Tone - Muscle tone was assessed at the neck and appendages and was normal.  Reflexes - The patient's reflexes were symmetrical in all extremities and she had no pathological reflexes.  Sensory - Light touch, temperature/pinprick were assessed and were symmetrical.  Coordination - The patient had normal movements in the hands with no ataxia or dysmetria.  Tremor was absent.  Gait and Station - deferred.   ASSESSMENT/PLAN [name] is a 85 y.o. female with history of stroke, mitral valve prolapse, closed head injury, and anterior cervical decompression presenting with 3-4 days of L leg weakness w/ unsteady gait. Larey Seat and hit her head day of admission.   Stroke:   Small R thalamic infarct secondary to small vessel disease source  CT head No acute abnormality. Small vessel disease. Atrophy.   MRI  Small lateral R thalamic infarct. Small vessel disease. Old L occipital infarct.  MRA   pending  Carotid Doppler  pending  2D Echo EF 55-60%. No source of embolus   LDL 73  HgbA1c 5.3  SCDs for VTE prophylaxis  aspirin 81 mg daily prior to admission, now on aspirin 81 mg daily and clopidogrel 75 mg daily  DAPT for 3 weeks and then plavix alone.  Therapy recommendations:  pending   Disposition:  pending   Hx stroke/TIA  12/03/2018 - R quadrantanopia at R inferior quadrant.  MRI brain confirmed a subacute left occipital ischemic infarct w/ L PCA P3 segment stenosis. EF 60-65%. Started on aspirin 81 mg daily. Follow[facility].  30 day monitoring no afib in 12/2018  Hypertension  Stable . Permissive hypertension (OK if < 220/120) but gradually normalize in 2-3 days . Lorey-term BP goal normotensive  Hyperlipidemia  Home meds:  lipitor 10  Now on lipitor 20  LDL 73, goal < 70  Continue statin at discharge  Other Stroke Risk Factors  Advanced age  ETOH use, alcohol level <10, advised to drink no more than 1 drink(s) a day  Family hx stroke (mother)  Chronic diastolic CHF  Other Active Problems  Hx TBI from fall, now w/ L periorbital hematoma from recent fall. CT maxillofacial No bone fx. L periorbital hematoma lateral and superior margin. On fall precautions.  Depression/anxiety on zoloft  Osteopenia/osteoporosis on fosamax   Hypokalemia K 3.4  Hospital day # 1  Marvel Plan, MD PhD Stroke Neurology 09/03/2019 1:43 PM   To contact Stroke Continuity provider, please refer to [email or web address]. After hours, contact General Neurology

## 2019-09-03 NOTE — Evaluation (Signed)
Occupational Therapy Evaluation Patient Details Name: Adriana [name] MRN: [id number] DOB: 03/16/1934 Today's Date: 09/03/2019    History of Present Illness Pt is a 85 y.o. female with PMH of HTN, MVP, CVA, diastolic CHF, C6-C7 cervical fx, osteoporosis, osteoarthritis, depression with anxiety, and hypercholesterolemia. Presented to ED from [facility] after fall with walker. Pt reports she had been feeling unsteady since 2 days prior and was leaning to her left side. MRI reveals acute right thalamic lacunar infarct.   Clinical Impression   PTA pt reports utilizing of DME to ambulate, walked to meals, organized her medications, and was no longer driving. Pt was admitted for above and treated for problem list below. Pt is A&Ox3 disoriented to situation. Requires min verbal cues for safety. Requires Min-Mod A with for ADLs due to decreased balance, coordination, and strength in LUE. Requires Min A with transfers and ambulation with verbal cues for safety and hand placement with walker. Noted 2 LOB while standing at sink - needing min A to right. Reports she would like to go home safely and do whatever is best for her situation. Believe pt would benefit from skilled OT services acutely and at the SNF level (rehab at [facility]) to increase independence and safety in ADLs.    Follow Up Recommendations  SNF - Rehab at [facility];Supervision/Assistance - 24 hour    Equipment Recommendations  Other (comment) (TBD at next venue of care)       Precautions / Restrictions Precautions Precautions: Fall Restrictions Weight Bearing Restrictions: No      Mobility Bed Mobility               General bed mobility comments: Pt upright in recliner upon arrival  Transfers Overall transfer level: Needs assistance Equipment used: Rolling walker (2 wheeled) Transfers: Sit to/from Stand Sit to Stand: Min assist         General transfer comment: Min A with verbal cues for hand placement on  walker    Balance Overall balance assessment: Needs assistance Sitting-balance support: Feet supported;No upper extremity supported Sitting balance-Leahy Scale: Fair   Postural control: Posterior lean Standing balance support: No upper extremity supported;During functional activity Standing balance-Leahy Scale: Poor Standing balance comment: 2 noted LOB at sink with assist to right posture.                           ADL either performed or assessed with clinical judgement   ADL Overall ADL's : Needs assistance/impaired     Grooming: Minimal assistance;Standing;Oral care;Wash/dry face Grooming Details (indicate cue type and reason): Min A for maintaing balance at sink Upper Body Bathing: Minimal assistance;Sitting Upper Body Bathing Details (indicate cue type and reason): Min A due to decreased strength/grip strength and coordination in L UE Lower Body Bathing: Sitting/lateral leans;Min guard Lower Body Bathing Details (indicate cue type and reason): Pt able to flex hip and knee to reach feet. Min Guard for Designer, jewellery Dressing : Minimal assistance;Sitting;Cueing for compensatory techniques Upper Body Dressing Details (indicate cue type and reason): Min A due to decreased strength/grip strength and coordination in L UE Lower Body Dressing: Moderate assistance;Sit to/from stand Lower Body Dressing Details (indicate cue type and reason): Min A sit<>stand. Pt able to flex hip and knee to reach socks and pull them up. Mod A need for dynamic balance with BUE unsupported with clothing manipulation.  Toilet Transfer: Minimal assistance;Cueing for safety;Cueing for sequencing;Ambulation;RW;Regular Teacher, adult education Details (indicate  cue type and reason): simulated to recliner. Min A sit<>stand with verbal cues for sequencing and safety with RW Toileting- Clothing Manipulation and Hygiene: Minimal Assistance;Sitting/lateral lean Toileting - Clothing Manipulation Details  (indicate cue type and reason): Min A for safety due to decreased balance   Tub/Shower Transfer Details (indicate cue type and reason): deferred due to pt safety. Pt reports tub shower with walk in and tub bench with back Functional mobility during ADLs: Minimal assistance;Cueing for safety;Rolling walker General ADL Comments: Min A with cues for safety with walker due to lack of balance, strength and coordination     Vision Baseline Vision/History: Wears glasses Wears Glasses: Reading only Patient Visual Report: Peripheral vision impairment;Other (comment) (vision impairment from previous stroke) Vision Assessment?: Yes Eye Alignment: Within Functional Limits Ocular Range of Motion: Within Functional Limits Alignment/Gaze Preference: Within Defined Limits Tracking/Visual Pursuits: Decreased smoothness of horizontal tracking (to right side) Saccades: Decreased speed of saccadic movement Convergence: Within functional limits Visual Fields: Right visual field deficit;Other (comment) (decreased peripheral vision grossly on R side) Depth Perception: Overshoots Additional Comments: Pt with gross R field deficit with decreased peripheral vision on L side            Pertinent Vitals/Pain Pain Assessment: Faces Pain Score: 4  Faces Pain Scale: Hurts little more Pain Location: eye - from fall Pain Descriptors / Indicators: Discomfort;Grimacing;Moaning Pain Intervention(s): Monitored during session     Hand Dominance Right   Extremity/Trunk Assessment Upper Extremity Assessment Upper Extremity Assessment: LUE deficits/detail LUE Deficits / Details: 4/5 shoulder flexion and 3/5 grip strength (compared to 5/5 R UE) decreased coordination with finger to nose taking increased time to reach finger and applying excess pressure to therapist finger. Unsure if due to perception, vision, or sensation. Will need to further assess. LUE Coordination: decreased gross motor;decreased fine motor    Lower Extremity Assessment Lower Extremity Assessment: Defer to PT evaluation   Cervical / Trunk Assessment Cervical / Trunk Assessment: Kyphotic   Communication Communication Communication: Expressive difficulties (studdering on some words)   Cognition Arousal/Alertness: Awake/alert Behavior During Therapy: WFL for tasks assessed/performed Overall Cognitive Status: Impaired/Different from baseline Area of Impairment: Orientation;Safety/judgement;Awareness                 Orientation Level: Disoriented to;Situation       Safety/Judgement: Decreased awareness of safety;Decreased awareness of deficits Awareness: Emergent   General Comments: Pt disoriented to situation (unaware of stroke - just knows she had a fall). Pt has decreased awareness of deficits and safety with RW.   General Comments  Pt reports she would like to get back home as safely as possible            Home Living Family/patient expects to be discharged to:: Private residence Living Arrangements: Alone Available Help at Discharge: Family;Available PRN/intermittently Type of Home: Independent living facility Home Access: Elevator     Home Layout: One level     Bathroom Shower/Tub: Chief Strategy Officer: Standard Bathroom Accessibility: Yes How Accessible: Accessible via walker Home Equipment: Walker - 2 wheels;Shower seat;Grab bars - tub/shower;Grab bars - toilet   Additional Comments: reports only one fall in 6 months      Prior Functioning/Environment Level of Independence: Independent with assistive device(s)        Comments: Pt walks to meals and has microwave in her room. No longer driving - daughter drives her to appointments        OT Problem List: Decreased strength;Impaired balance (sitting  and/or standing);Impaired vision/perception;Decreased coordination;Decreased safety awareness;Decreased knowledge of use of DME or AE;Pain;Impaired UE functional use      OT  Treatment/Interventions: Self-care/ADL training;Therapeutic exercise;Neuromuscular education;Therapeutic activities;Visual/perceptual remediation/compensation;Patient/family education;Balance training    OT Goals(Current goals can be found in the care plan section) Acute Rehab OT Goals Patient Stated Goal: go home safely OT Goal Formulation: With patient Time For Goal Achievement: 09/17/19 Potential to Achieve Goals: Good  OT Frequency: Min 2X/week    AM-PAC OT "6 Clicks" Daily Activity     Outcome Measure Help from another person eating meals?: None Help from another person taking care of personal grooming?: A Little Help from another person toileting, which includes using toliet, bedpan, or urinal?: A Little Help from another person bathing (including washing, rinsing, drying)?: A Little Help from another person to put on and taking off regular upper body clothing?: A Little Help from another person to put on and taking off regular lower body clothing?: A Little 6 Click Score: 19   End of Session Equipment Utilized During Treatment: Gait belt;Rolling walker Nurse Communication: Mobility status  Activity Tolerance: Patient tolerated treatment well Patient left: in chair;with call bell/phone within reach;with chair alarm set  OT Visit Diagnosis: Unsteadiness on feet (R26.81);Other abnormalities of gait and mobility (R26.89);Muscle weakness (generalized) (M62.81);Low vision, both eyes (H54.2);Other symptoms and signs involving the nervous system (R29.898);Pain;Hemiplegia and hemiparesis Hemiplegia - Right/Left: Left Hemiplegia - dominant/non-dominant: Non-Dominant Hemiplegia - caused by: Cerebral infarction Pain - Right/Left: Left Pain - part of body:  (hand and eye from fall)                Time: 1031-1101 OT Time Calculation (min): 30 min Charges:  OT General Charges $OT Visit: 1 Visit OT Evaluation $OT Eval Moderate Complexity: 1 Mod OT Treatments $Self Care/Home Management :  8-22 mins  Kymia Simi/OTS  Adiel Erney 09/03/2019, 11:59 AM

## 2019-09-04 DIAGNOSIS — I10 Essential (primary) hypertension: Secondary | ICD-10-CM

## 2019-09-04 LAB — CBC
HCT: 41.9 % (ref 36.0–46.0)
Hemoglobin: 13.9 g/dL (ref 12.0–15.0)
MCH: 31.2 pg (ref 26.0–34.0)
MCHC: 33.2 g/dL (ref 30.0–36.0)
MCV: 93.9 fL (ref 80.0–100.0)
Platelets: 243 10*3/uL (ref 150–400)
RBC: 4.46 MIL/uL (ref 3.87–5.11)
RDW: 12.4 % (ref 11.5–15.5)
WBC: 8.3 10*3/uL (ref 4.0–10.5)
nRBC: 0 % (ref 0.0–0.2)

## 2019-09-04 LAB — BASIC METABOLIC PANEL
Anion gap: 8 (ref 5–15)
BUN: 14 mg/dL (ref 8–23)
CO2: 26 mmol/L (ref 22–32)
Calcium: 9.4 mg/dL (ref 8.9–10.3)
Chloride: 105 mmol/L (ref 98–111)
Creatinine, Ser: 0.83 mg/dL (ref 0.44–1.00)
GFR calc Af Amer: >60 mL/min (ref >60)
GFR calc non Af Amer: >60 mL/min (ref >60)
Glucose, Bld: 105 mg/dL — ABNORMAL HIGH (ref 70–99)
Potassium: 3.5 mmol/L (ref 3.5–5.1)
Sodium: 139 mmol/L (ref 135–145)

## 2019-09-04 NOTE — Progress Notes (Signed)
STROKE TEAM PROGRESS NOTE   INTERVAL HISTORY Daughter at bedside. Pt sitting in chair, no acute event overnight. Neuro stable. PT/OT recommend SNF. [facility]    Vitals:   09/03/19 2357 09/04/19 0334 09/04/19 0756 09/04/19 1229  BP: (!) 152/80 128/90 134/90 (!) 142/88  Pulse: 74 86 67 76  Resp: 17 14 13 20   Temp: 97.7 F (36.5 C) (!) 97.5 F (36.4 C) 98.2 F (36.8 C) 97.6 F (36.4 C)  TempSrc: Oral Oral Oral Oral  SpO2: 95% 95% 94% 98%  Weight:      Height:        CBC:  Recent Labs  Lab 09/02/19 1130 09/02/19 1130 09/03/19 0331 09/04/19 0437  WBC 8.1   < > 6.6 8.3  NEUTROABS 6.0  --  4.1  --   HGB 13.5   < > 12.8 13.9  HCT 41.6   < > 39.3 41.9  MCV 94.5   < > 93.8 93.9  PLT 236   < > 220 243   < > = values in this interval not displayed.    Basic Metabolic Panel:  Recent Labs  Lab 09/03/19 0331 09/04/19 0437  NA 138 139  K 3.4* 3.5  CL 105 105  CO2 24 26  GLUCOSE 95 105*  BUN 16 14  CREATININE 1.01* 0.83  CALCIUM 9.5 9.4   Lipid Panel:     Component Value Date/Time   CHOL 137 09/03/2019 0331   TRIG 71 09/03/2019 0331   HDL 50 09/03/2019 0331   CHOLHDL 2.7 09/03/2019 0331   VLDL 14 09/03/2019 0331   LDLCALC 73 09/03/2019 0331   LDLCALC 69 06/30/2019 0715   HgbA1c:  Lab Results  Component Value Date   HGBA1C 5.3 09/03/2019   Urine Drug Screen:     Component Value Date/Time   LABOPIA NONE DETECTED 09/02/2019 2055   COCAINSCRNUR NONE DETECTED 09/02/2019 2055   LABBENZ NONE DETECTED 09/02/2019 2055   AMPHETMU NONE DETECTED 09/02/2019 2055   THCU NONE DETECTED 09/02/2019 2055   LABBARB NONE DETECTED 09/02/2019 2055    Alcohol Level     Component Value Date/Time   ETH <10 09/02/2019 1400    IMAGING past 24 hours MR ANGIO HEAD WO CONTRAST  Result Date: 09/03/2019 CLINICAL DATA:  Follow-up examination for acute stroke. EXAM: MRA HEAD WITHOUT CONTRAST TECHNIQUE: Angiographic images of the Circle of Willis  were obtained using MRA technique without intravenous contrast. COMPARISON:  Prior MRI from 09/02/2019. FINDINGS: ANTERIOR CIRCULATION: Visualized distal cervical segments of the internal carotid arteries are widely patent with symmetric antegrade flow. Petrous, cavernous, and supraclinoid ICAs widely patent without no stenosis or other abnormality. Origin of the ophthalmic arteries patent. ICA termini well perfused. A1 segments patent bilaterally. Normal anterior communicating artery complex. Anterior cerebral arteries widely patent to their distal aspects without stenosis. M1 segments widely patent bilaterally. Left M1 bifurcates early. Distal MCA branches well perfused and symmetric. POSTERIOR CIRCULATION: Visualized vertebral arteries patent to the vertebrobasilar junction without stenosis. Neither PICA origin visualized. Basilar widely patent to its distal aspect without stenosis. Superior cerebral arteries patent bilaterally. Both PCAs primarily supplied via the basilar. Left PCA widely patent to its distal aspect. There is a single short-segment severe distal right P2 stenosis (series 255, image 8), which could contribute to the previously identified right thalamic infarct. Right PCA otherwise well perfused to its distal aspect. No aneurysm or other vascular abnormality. IMPRESSION: 1. Single short-segment severe distal right  P2 stenosis, which could contribute to the previously identified right thalamic infarct. 2. Otherwise normal intracranial MRA. No other large vessel occlusion, hemodynamically significant stenosis, or other vascular abnormality. Electronically Signed   By: Rise Mu M.D.   On: 09/03/2019 20:04   VAS US CAROTID  Result Date: 09/03/2019 Carotid Arterial Duplex Study Indications:       CVA. Risk Factors:      Hypertension. Other Factors:     Acute infarct of the lateral right thalamus. Comparison Study:  none Performing Technologist: Jeb Levering RDMS, RVT  Examination  Guidelines: A complete evaluation includes B-mode imaging, spectral Doppler, color Doppler, and power Doppler as needed of all accessible portions of each vessel. Bilateral testing is considered an integral part of a complete examination. Limited examinations for reoccurring indications may be performed as noted.  Right Carotid Findings: +----------+--------+--------+--------+------------------+--------+           PSV cm/sEDV cm/sStenosisPlaque DescriptionComments +----------+--------+--------+--------+------------------+--------+ CCA Prox  49      13                                         +----------+--------+--------+--------+------------------+--------+ CCA Distal45      12                                         +----------+--------+--------+--------+------------------+--------+ ICA Prox  36      11                                         +----------+--------+--------+--------+------------------+--------+ ICA Distal92      25                                         +----------+--------+--------+--------+------------------+--------+ ECA       54      10                                         +----------+--------+--------+--------+------------------+--------+ +----------+--------+-------+----------------+-------------------+           PSV cm/sEDV cmsDescribe        Arm Pressure (mmHG) +----------+--------+-------+----------------+-------------------+ [id number]             Multiphasic, WNL                    +----------+--------+-------+----------------+-------------------+ +---------+--------+--+--------+--+---------+ VertebralPSV cm/s34EDV cm/s11Antegrade +---------+--------+--+--------+--+---------+  Left Carotid Findings: +----------+--------+--------+--------+------------------+--------+           PSV cm/sEDV cm/sStenosisPlaque DescriptionComments +----------+--------+--------+--------+------------------+--------+ CCA Prox  65      18                                          +----------+--------+--------+--------+------------------+--------+ CCA Distal41      10                                         +----------+--------+--------+--------+------------------+--------+  ICA Prox  39      14                                         +----------+--------+--------+--------+------------------+--------+ ICA Distal63      23                                         +----------+--------+--------+--------+------------------+--------+ ECA       54      5                                          +----------+--------+--------+--------+------------------+--------+ +----------+--------+--------+----------------+-------------------+           PSV cm/sEDV cm/sDescribe        Arm Pressure (mmHG) +----------+--------+--------+----------------+-------------------+ [id number]              Multiphasic, WNL                    +----------+--------+--------+----------------+-------------------+ +---------+--------+--+--------+--+---------+ VertebralPSV cm/s42EDV cm/s15Antegrade +---------+--------+--+--------+--+---------+   Summary: Right Carotid: The extracranial vessels were near-normal with only minimal wall                thickening or plaque. Left Carotid: The extracranial vessels were near-normal with only minimal wall               thickening or plaque.  *See table(s) above for measurements and observations.  Electronically signed by Lemar LivingsBrandon Cain MD on 09/03/2019 at 4:31:33 PM.    Final     PHYSICAL EXAM   Temp:  [97.5 F (36.4 C)-98.2 F (36.8 C)] 97.6 F (36.4 C) (07/02 1229) Pulse Rate:  [67-86] 76 (07/02 1229) Resp:  [13-20] 20 (07/02 1229) BP: (128-152)/(80-90) 142/88 (07/02 1229) SpO2:  [94 %-98 %] 98 % (07/02 1229)  General - Well nourished, well developed, in no apparent distress.  Left frontal and periorbital ecchymosis.    Ophthalmologic - fundi not visualized due to  noncooperation.  Cardiovascular - Regular rhythm and rate.  Mental Status -  Level of arousal and orientation to time, place, and person were intact. Language including expression, naming, repetition, comprehension was assessed and found intact. Fund of Knowledge was assessed and was intact.  Cranial Nerves II - XII - II - Visual field intact OU. III, IV, VI - Extraocular movements intact. V - Facial sensation intact bilaterally. VII - Facial movement intact bilaterally. VIII - Hearing & vestibular intact bilaterally. X - Palate elevates symmetrically. XI - Chin turning & shoulder shrug intact bilaterally. XII - Tongue protrusion intact.  Motor Strength - The patient's strength was normal in all extremities and pronator drift was absent except left foot DF/PF 4/5.  Bulk was normal and fasciculations were absent.   Motor Tone - Muscle tone was assessed at the neck and appendages and was normal.  Reflexes - The patient's reflexes were symmetrical in all extremities and she had no pathological reflexes.  Sensory - Light touch, temperature/pinprick were assessed and were symmetrical.    Coordination - The patient had normal movements in the hands with no ataxia or dysmetria.  Tremor was absent.  Gait and Station - deferred.   ASSESSMENT/PLAN [name] is a 85 y.o.  female with history of stroke, mitral valve prolapse, closed head injury, and anterior cervical decompression presenting with 3-4 days of L leg weakness w/ unsteady gait. Larey Seat and hit her head day of admission.   Stroke:   Small R thalamic infarct secondary to small vs. Large vessel disease source  CT head No acute abnormality. Small vessel disease. Atrophy.   MRI  Small lateral R thalamic infarct. Small vessel disease. Old L occipital infarct.  MRA  Single short severe distal R P2 stenosis  Carotid Doppler  B ICA 1-39% stenosis, VAs antegrade    2D Echo EF 55-60%. No source of embolus   LDL 73  HgbA1c  5.3  SCDs for VTE prophylaxis  aspirin 81 mg daily prior to admission, now on aspirin 81 mg daily and clopidogrel 75 mg daily  DAPT for 3 weeks and then plavix alone.  Therapy recommendations:  SNF [facility] (admitted from [facility])  Disposition:  pending   Hx stroke/TIA  12/03/2018 - R quadrantanopia at R inferior quadrant.  MRI brain confirmed a subacute left occipital ischemic infarct w/ L PCA P3 segment stenosis. EF 60-65%. Started on aspirin 81 mg daily. Followed by [place] at [facility].  30 day monitoring no afib in 12/2018  Hypertension  Stable . Permissive hypertension (OK if < 220/120) but gradually normalize in 2-3 days . Puder-term BP goal normotensive  Hyperlipidemia  Home meds:  lipitor 10  Now on lipitor 20  LDL 73, goal < 70  Continue statin at discharge  Other Stroke Risk Factors  Advanced age  ETOH use, alcohol level <10, advised to drink no more than 1 drink(s) a day  Family hx stroke (mother)  Chronic diastolic CHF  Other Active Problems  Hx TBI from fall, now w/ L periorbital hematoma from recent fall. CT maxillofacial No bone fx. L periorbital hematoma lateral and superior margin. On fall precautions.  Depression/anxiety on zoloft  Osteopenia/osteoporosis on fosamax   Hypokalemia K 3.4-3.5 - resolved  Hospital day # 2  Neurology will sign off. Please call with questions. Pt will follow up with Dr. Marjory Lies at [facility] in about 4 weeks. Thanks for the consult.  Marvel Plan, MD PhD Stroke Neurology 09/04/2019 1:01 PM   To contact Stroke Continuity provider, please refer to [email or web address]. After hours, contact General Neurology

## 2019-09-04 NOTE — TOC Progression Note (Signed)
Transition of Care [facility]) - Progression Note    Patient Details  Name: SYRIA KESTNER MRN: [id number] Date of Birth: 12-11-1934  Transition of Care [facility]) CM/SW Contact  Pollie Friar, RN Phone Number: 09/04/2019, 12:30 PM  Clinical Narrative:    Cm met with the patient and she wants to private pay for [place] until they have a medicare bed. She will discuss further with her children but that is the plan for now. CM has updated Katie at [facility].  Pt will need new covid and MD updated. Information sent to [facility].  Room at [facility]: [place] 3 Number for report: [phone] and ask for Maury Dus RN   Expected Discharge Plan: [place] Barriers to Discharge: Continued Medical Work up  Expected Discharge Plan and Services Expected Discharge Plan: [place] In-house Referral: NA Discharge Planning Services: NA Post Acute Care Choice: [place] Living arrangements for the past 2 months: [place]                 DME Arranged: N/A DME Agency: NA       HH Arranged: NA HH Agency: NA         Social Determinants of Health (SDOH) Interventions    Readmission Risk Interventions Readmission Risk Prevention Plan 09/03/2019  Transportation Screening Complete  Some recent data might be hidden

## 2019-09-04 NOTE — Progress Notes (Signed)
Pts PASAR number: [id number]

## 2019-09-04 NOTE — Progress Notes (Signed)
Physical Therapy Treatment Patient Details Name: Adriana [name] MRN: [id number] DOB: 1934-07-22 Today's Date: 09/04/2019    History of Present Illness Pt is a 85 y.o. female with PMH of HTN, MVP, CVA, diastolic CHF, C6-C7 cervical fx, osteoporosis, osteoarthritis, depression with anxiety, and hypercholesterolemia. Presented to ED from [facility] after fall with walker. Pt reports she had been feeling unsteady since 2 days prior and was leaning to her left side. MRI reveals acute right thalamic lacunar infarct.    PT Comments    Patient is making progress toward PT goals and tolerated increased mobility well. Pt continues to present with impaired balance and L LE weakness requiring min-mod A for gait training with use of RW. Continue to progress as tolerated with anticipated d/c to SNF for further skilled PT services.     Follow Up Recommendations  SNF ([facility])     Equipment Recommendations  None recommended by PT    Recommendations for Other Services       Precautions / Restrictions Precautions Precautions: Fall Restrictions Weight Bearing Restrictions: No    Mobility  Bed Mobility Overal bed mobility: Needs Assistance Bed Mobility: Supine to Sit     Supine to sit: Min guard     General bed mobility comments: min guard for safety; increased time and effort; cues for sequencing due to back pain  Transfers Overall transfer level: Needs assistance Equipment used: Rolling walker (2 wheeled) Transfers: Sit to/from Stand Sit to Stand: Min assist         General transfer comment: cues for safe hand placement; assist to power up and to steady  Ambulation/Gait Ambulation/Gait assistance: Mod assist;Min assist Gait Distance (Feet): 100 Feet Assistive device: Rolling walker (2 wheeled) Gait Pattern/deviations: Step-through pattern;Decreased stride length;Decreased dorsiflexion - left;Decreased step length - left;Trunk flexed Gait velocity: decreased   General Gait  Details: grossly min A for balance with mod A needed at times due to LOB; cues for upright posture and safe use of AD   Stairs             Wheelchair Mobility    Modified Rankin (Stroke Patients Only) Modified Rankin (Stroke Patients Only) Pre-Morbid Rankin Score: Slight disability (did not cook, but did laundry) Modified Rankin: Moderately severe disability     Balance Overall balance assessment: Needs assistance Sitting-balance support: Feet supported Sitting balance-Leahy Scale: Good     Standing balance support: Bilateral upper extremity supported Standing balance-Leahy Scale: Poor                              Cognition Arousal/Alertness: Awake/alert Behavior During Therapy: WFL for tasks assessed/performed Overall Cognitive Status: Within Functional Limits for tasks assessed                                        Exercises General Exercises - Lower Extremity Heel Slides: AROM;Both;Supine Hip ABduction/ADduction: AROM;Both;Supine Straight Leg Raises: AROM;Both;Supine Other Exercises Other Exercises: bridging     General Comments        Pertinent Vitals/Pain Pain Assessment: No/denies pain    Home Living                      Prior Function            PT Goals (current goals can now be found in the care plan section)  Acute Rehab PT Goals Patient Stated Goal: go home safely Progress towards PT goals: Progressing toward goals    Frequency    Min 4X/week      PT Plan Current plan remains appropriate    Co-evaluation              AM-PAC PT "6 Clicks" Mobility   Outcome Measure  Help needed turning from your back to your side while in a flat bed without using bedrails?: A Little Help needed moving from lying on your back to sitting on the side of a flat bed without using bedrails?: A Little Help needed moving to and from a bed to a chair (including a wheelchair)?: A Little Help needed standing up  from a chair using your arms (e.g., wheelchair or bedside chair)?: A Little Help needed to walk in hospital room?: A Little Help needed climbing 3-5 steps with a railing? : Total 6 Click Score: 16    End of Session Equipment Utilized During Treatment: Gait belt Activity Tolerance: Patient tolerated treatment well Patient left: in chair;with call bell/phone within reach;with chair alarm set Nurse Communication: Mobility status PT Visit Diagnosis: Other abnormalities of gait and mobility (R26.89);History of falling (Z91.81);Other symptoms and signs involving the nervous system (Q00.867)     Time: 6195-0932 PT Time Calculation (min) (ACUTE ONLY): 27 min  Charges:  $Gait Training: 23-37 mins                     Erline Levine, PTA Acute Rehabilitation Services Pager: [phone] Office: [phone]     Carolynne Edouard 09/04/2019, 10:13 AM

## 2019-09-04 NOTE — Progress Notes (Signed)
PROGRESS NOTE                                                                                                                                                                                                             Patient Demographics:    Adriana [name], is a 85 y.o. female, DOB - 1935/02/16, ZOX:[id number]  Admit date - 09/02/2019   Admitting Physician Ollen Bowl, MD  Outpatient Primary MD for the patient is Mahlon Gammon, MD  LOS - 2   Chief Complaint  Patient presents with   Fall   Weakness       Brief Narrative    HPI: Adriana [name] is a 85 y.o. female with medical history significant of hypertension, CVA with some residual blurry vision in September 2020, hyperlipidemia, depression, cervical spondylosis presents to emergency department for evaluation of left-sided weakness since 3 to 4 days and fall this morning.  Patient tells me that since couple of days he has been unsteady and feels as if she is leaning towards left side.  This morning around 6 AM she fell on the ground and hit on her head.  Denies lightheadedness, dizziness, headache, change in vision prior or after the fall.  No history of loss of consciousness, seizure, slurred speech, facial droop.  Reports unsteady gait and left-sided weakness since couple of days.  She was seen by ophthalmologist outpatient for follow-up and her eyes were dilated for examination.  She thought that her unsteadiness is because of eye dilation.  No history of chest pain, shortness of breath, palpitation, leg swelling, fever, chills, cough, congestion, abdominal pain, urinary or bowel changes.  She lives alone at home.  Uses walker for ambulation.  No history of smoking, alcohol, listed drug use.  ED Course: Upon arrival to ED: Patient heart rate in 50s, other vitals within normal limits.  Afebrile with no leukocytosis.  Basic labs such as CBC, CMP, UA: Negative, PT INR, APTT, ethanol, UDS:  Pending.  CT head without contrast obtained which shows no acute intracranial pathology.  Atrophy, signs of prior infarct and evidence of chronic microvascular ischemic changes similar to prior study.  Periorbital hematoma on the left.  EDP consulted neurology recommended MRI brain.  [facility] hospitalist consulted for admission for stroke work-up.   Subjective:    Adriana [name] today has, No  headache, No chest pain, No abdominal pain - No Nausea, he denies any shortness of breath.    Assessment  & Plan :    Active Problems:   Depression with anxiety   Hypertension   CVA (cerebral vascular accident) (HCC)   Diastolic CHF (HCC)   Ischemic stroke (HCC)   Acute CVA -Has small right thalamic infarct secondary to small vessel disease -MRA showing single short severe distal R P2 stenosis -Carotid Dopplers pending -2D echo with a preserved EF 55 to 60%, with no source of embolus -LDL 73, she is on statin -Hemoglobin  A1c is 5.3 -PT/OT/SLP consulted.  Recommendation for SNF placement -Continue with dual antiplatelet therapy aspirin and Plavix for 3 weeks, then Plavix alone per neuro recommendation.  Left periorbital hematoma: -Status post fall -Reviewed CT head.  MRI maxillofacial is ordered and is pending. -Monitor H&H.  Tylenol as needed for pain control -On fall precautions  Hypertension:  -Symptoms been going on for few days, so allow for lisinopril, Coreg gradually over the next 2 to 3 days, metoprolol remains on hold .  Hyperlipidemia: -LDL is 73, she is on Lipitor 10 mg at home, currently on 20 mg, continue on discharge  Depression/anxiety: Continue Zoloft  Osteopenia/osteoporosis: Continue Fosamax  Chronic diastolic congestive heart failure: Stable -Patient appears euvolemic on exam. -Reviewed echo from 02/11/2019 which showed grade 1 diastolic dysfunction -Patient is not on diuretics. -Continue to monitor.  Hypokalemia -Repleted   COVID-19 Labs  No results for  input(s): DDIMER, FERRITIN, LDH, CRP in the last 72 hours.  Lab Results  Component Value Date   SARSCOV2NAA NEGATIVE 09/02/2019     Code Status : Full  Family Communication  : None at bedside  Disposition Plan  :  Status is: Inpatient  Remains inpatient appropriate because:Inpatient level of care appropriate due to severity of illness   Dispo: The patient is from: Home              Anticipated d/c is to: SNF              Anticipated d/c date is: 2 days              Patient currently is not medically stable to d/c.       Consults  :  neuro  Procedures  : none  DVT Prophylaxis  :  SCD  Lab Results  Component Value Date   PLT 243 09/04/2019    Antibiotics  :    Anti-infectives (From admission, onward)   None        Objective:   Vitals:   09/04/19 0334 09/04/19 0756 09/04/19 1229 09/04/19 1619  BP: 128/90 134/90 (!) 142/88 (!) 158/84  Pulse: 86 67 76 75  Resp: 14 13 20 20   Temp: (!) 97.5 F (36.4 C) 98.2 F (36.8 C) 97.6 F (36.4 C) (!) 97.5 F (36.4 C)  TempSrc: Oral Oral Oral Oral  SpO2: 95% 94% 98% 97%  Weight:      Height:        Wt Readings from Last 3 Encounters:  09/02/19 61.2 kg  08/05/19 60.9 kg  07/30/19 62.9 kg     Intake/Output Summary (Last 24 hours) at 09/04/2019 1642 Last data filed at 09/04/2019 0919 Gross per 24 hour  Intake --  Output 600 ml  Net -600 ml     Physical Exam  Awake Alert, Oriented X 3, No new F.N deficits, Normal affect, left eye periorbital hematoma Symmetrical Chest wall movement,  Good air movement bilaterally, CTAB RRR,No Gallops,Rubs or new Murmurs, No Parasternal Heave +ve B.Sounds, Abd Soft, No tenderness, No rebound - guarding or rigidity. No Cyanosis, Clubbing or edema, No new Rash or bruise       Data Review:    CBC Recent Labs  Lab 09/02/19 1130 09/03/19 0331 09/04/19 0437  WBC 8.1 6.6 8.3  HGB 13.5 12.8 13.9  HCT 41.6 39.3 41.9  PLT 236 220 243  MCV 94.5 93.8 93.9  MCH 30.7 30.5  31.2  MCHC 32.5 32.6 33.2  RDW 12.4 12.4 12.4  LYMPHSABS 1.2 1.4  --   MONOABS 0.7 0.7  --   EOSABS 0.2 0.3  --   BASOSABS 0.1 0.0  --     Chemistries  Recent Labs  Lab 09/02/19 1130 09/03/19 0331 09/04/19 0437  NA 140 138 139  K 4.0 3.4* 3.5  CL 105 105 105  CO2 GLUCOSE 102* 95 105*  BUN CREATININE 0.75 1.01* 0.83  CALCIUM 10.0 9.5 9.4  AST 30  --   --   ALT 15  --   --   ALKPHOS 52  --   --   BILITOT 1.0  --   --    ------------------------------------------------------------------------------------------------------------------ Recent Labs    09/03/19 0331  CHOL 137  HDL 50  LDLCALC 73  TRIG 71  CHOLHDL 2.7    Lab Results  Component Value Date   HGBA1C 5.3 09/03/2019   ------------------------------------------------------------------------------------------------------------------ No results for input(s): TSH, T4TOTAL, T3FREE, THYROIDAB in the last 72 hours.  Invalid input(s): FREET3 ------------------------------------------------------------------------------------------------------------------ No results for input(s): VITAMINB12, FOLATE, FERRITIN, TIBC, IRON, RETICCTPCT in the last 72 hours.  Coagulation profile Recent Labs  Lab 09/02/19 1320  INR 1.0    No results for input(s): DDIMER in the last 72 hours.  Cardiac Enzymes No results for input(s): CKMB, TROPONINI, MYOGLOBIN in the last 168 hours.  Invalid input(s): CK ------------------------------------------------------------------------------------------------------------------ No results found for: BNP  Inpatient Medications  Scheduled Meds:   stroke: mapping our early stages of recovery book   Does not apply Once   aspirin EC  81 mg Oral Daily   atorvastatin  20 mg Oral q AM   clopidogrel  75 mg Oral Daily   lisinopril  5 mg Oral Daily   Continuous Infusions: PRN Meds:.acetaminophen **OR** acetaminophen (TYLENOL) oral liquid 160 mg/5 mL **OR**  acetaminophen  Micro Results Recent Results (from the past 240 hour(s))  SARS Coronavirus 2 by RT PCR (hospital order, performed in [facility] hospital lab) Nasopharyngeal Nasopharyngeal Swab     Status: None   Collection Time: 09/02/19  2:05 PM   Specimen: Nasopharyngeal Swab  Result Value Ref Range Status   SARS Coronavirus 2 NEGATIVE NEGATIVE Final    Comment: (NOTE) SARS-CoV-2 target nucleic acids are NOT DETECTED.  The SARS-CoV-2 RNA is generally detectable in upper and lower respiratory specimens during the acute phase of infection. The lowest concentration of SARS-CoV-2 viral copies this assay can detect is 250 copies / mL. A negative result does not preclude SARS-CoV-2 infection and should not be used as the sole basis for treatment or other patient management decisions.  A negative result may occur with improper specimen collection / handling, submission of specimen other than nasopharyngeal swab, presence of viral mutation(s) within the areas targeted by this assay, and inadequate number of viral copies (<250 copies / mL). A negative result must be combined with clinical observations, patient history, and epidemiological  information.  Fact Sheet for Patients:   BoilerBrush.com.cy  Fact Sheet for Healthcare Providers: https://pope.com/  This test is not yet approved or  cleared by the [place] FDA and has been authorized for detection and/or diagnosis of SARS-CoV-2 by FDA under an Emergency Use Authorization (EUA).  This EUA will remain in effect (meaning this test can be used) for the duration of the COVID-19 declaration under Section 564(b)(1) of the Act, 21 U.S.C. section 360bbb-3(b)(1), unless the authorization is terminated or revoked sooner.  Performed at [facility], [address], [place], [place] [address]     Radiology Reports CT Head Wo Contrast  Result Date: 09/02/2019 CLINICAL DATA:   Facial trauma, hematoma above the LEFT eye EXAM: CT HEAD WITHOUT CONTRAST CT MAXILLOFACIAL WITHOUT CONTRAST TECHNIQUE: Multidetector CT imaging of the head and maxillofacial structures were performed using the standard protocol without intravenous contrast. Multiplanar CT image reconstructions of the maxillofacial structures were also generated. COMPARISON:  MRI brain of 12/03/2018 FINDINGS: CT HEAD FINDINGS Brain: No evidence of acute infarction, hemorrhage, hydrocephalus, extra-axial collection or mass lesion/mass effect. Signs of atrophy, chronic microvascular ischemic change in deep and periventricular white matter and of prior infarct. Vascular: No hyperdense vessel or unexpected calcification. Skull: Normal. Negative for fracture or focal lesion. Other: LEFT supraorbital/frontal hematoma along the lateral margin of the LEFT orbit, see below. CT MAXILLOFACIAL FINDINGS Osseous: No fracture or mandibular dislocation. No destructive process. Orbits: Periorbital stranding on the LEFT mainly along the lateral and superior margin of the orbit. No retro-orbital fat stranding. Sinuses: Clear. Soft tissues: Periorbital hematoma on the LEFT. IMPRESSION: 1. No acute intracranial pathology. Atrophy, signs of prior infarct and evidence of chronic microvascular ischemic change similar to the prior study. 2. No acute facial bone fracture. 3. Periorbital hematoma on the LEFT mainly along the lateral and superior margin of the orbit. No retro-orbital fat stranding. Electronically Signed   By: Donzetta Kohut M.D.   On: 09/02/2019 12:00   MR ANGIO HEAD WO CONTRAST  Result Date: 09/03/2019 CLINICAL DATA:  Follow-up examination for acute stroke. EXAM: MRA HEAD WITHOUT CONTRAST TECHNIQUE: Angiographic images of the Circle of Willis were obtained using MRA technique without intravenous contrast. COMPARISON:  Prior MRI from 09/02/2019. FINDINGS: ANTERIOR CIRCULATION: Visualized distal cervical segments of the internal carotid  arteries are widely patent with symmetric antegrade flow. Petrous, cavernous, and supraclinoid ICAs widely patent without no stenosis or other abnormality. Origin of the ophthalmic arteries patent. ICA termini well perfused. A1 segments patent bilaterally. Normal anterior communicating artery complex. Anterior cerebral arteries widely patent to their distal aspects without stenosis. M1 segments widely patent bilaterally. Left M1 bifurcates early. Distal MCA branches well perfused and symmetric. POSTERIOR CIRCULATION: Visualized vertebral arteries patent to the vertebrobasilar junction without stenosis. Neither PICA origin visualized. Basilar widely patent to its distal aspect without stenosis. Superior cerebral arteries patent bilaterally. Both PCAs primarily supplied via the basilar. Left PCA widely patent to its distal aspect. There is a single short-segment severe distal right P2 stenosis (series 255, image 8), which could contribute to the previously identified right thalamic infarct. Right PCA otherwise well perfused to its distal aspect. No aneurysm or other vascular abnormality. IMPRESSION: 1. Single short-segment severe distal right P2 stenosis, which could contribute to the previously identified right thalamic infarct. 2. Otherwise normal intracranial MRA. No other large vessel occlusion, hemodynamically significant stenosis, or other vascular abnormality. Electronically Signed   By: Rise Mu M.D.   On: 09/03/2019 20:04  MR BRAIN WO CONTRAST  Result Date: 09/02/2019 CLINICAL DATA:  Fall EXAM: MRI HEAD WITHOUT CONTRAST TECHNIQUE: Multiplanar, multiecho pulse sequences of the brain and surrounding structures were obtained without intravenous contrast. COMPARISON:  12/03/2018 FINDINGS: Brain: Small subcentimeter focus of mildly reduced diffusion is present at the lateral aspect of the right thalamus. There is no intracranial mass or significant mass effect. There is no hydrocephalus or  extra-axial fluid collection. Prominence of the ventricles and sulci reflects stable parenchymal volume loss. Patchy and confluent areas of T2 hyperintensity in the supratentorial and pontine white matter are nonspecific but probably reflect moderate to advanced chronic microvascular ischemic changes. There is a chronic infarct of the left occipital lobe. Punctate focus of susceptibility hypointensity at the posterior left thalamus is compatible with chronic microhemorrhage or less likely mineralization. Vascular: Major vessel flow voids at the skull base are preserved. Skull and upper cervical spine: Normal marrow signal is preserved. Sinuses/Orbits: Minor mucosal thickening. Bilateral lens replacements. Other: Sella is unremarkable.  Mastoid air cells are clear. IMPRESSION: Small acute infarct of the lateral right thalamus. Moderate to advanced chronic microvascular ischemic changes. Chronic left occipital infarct. Electronically Signed   By: Guadlupe Spanish M.D.   On: 09/02/2019 12:50   ECHOCARDIOGRAM COMPLETE  Result Date: 09/02/2019    ECHOCARDIOGRAM REPORT   Patient Name:   NYILAH KIGHT Mattie Date of Exam: 09/02/2019 Medical Rec #:  [id number]   Height:       66.0 in Accession #:    [id number]  Weight:       135.0 lb Date of Birth:  November 13, 1934   BSA:          1.692 m Patient Age:    84 years    BP:           149/78 mmHg Patient Gender: F           HR:           52 bpm. Exam Location:  Inpatient Procedure: 2D Echo, Cardiac Doppler and Color Doppler Indications:    Stroke 434.91 / I163.9  History:        Patient has prior history of Echocardiogram examinations, most                 recent 02/11/2019. CHF, Stroke, Mitral Valve Prolapse; Risk                 Factors:Hypertension, Dyslipidemia and Non-Smoker. Pericardial                 effusion.  Sonographer:    Renella Cunas RDCS Referring Phys: [id number] Kasandra Knudsen PAHWANI IMPRESSIONS  1. Left ventricular ejection fraction, by estimation, is 55 to 60%. The left ventricle has  normal function. The left ventricle has no regional wall motion abnormalities. Left ventricular diastolic parameters are consistent with Grade I diastolic dysfunction (impaired relaxation).  2. Right ventricular systolic function is normal. The right ventricular size is normal. There is normal pulmonary artery systolic pressure.  3. No evidence of mitral valve prolapse. The mitral valve is normal in structure. Trivial mitral valve regurgitation. No evidence of mitral stenosis.  4. The aortic valve is tricuspid. Aortic valve regurgitation is not visualized. No aortic stenosis is present.  5. The inferior vena cava is normal in size with greater than 50% respiratory variability, suggesting right atrial pressure of 3 mmHg. FINDINGS  Left Ventricle: Left ventricular ejection fraction, by estimation, is 55 to 60%. The left ventricle has normal function. The left  ventricle has no regional wall motion abnormalities. The left ventricular internal cavity size was normal in size. There is  no left ventricular hypertrophy. Left ventricular diastolic parameters are consistent with Grade I diastolic dysfunction (impaired relaxation). Indeterminate filling pressures. Right Ventricle: The right ventricular size is normal. No increase in right ventricular wall thickness. Right ventricular systolic function is normal. There is normal pulmonary artery systolic pressure. The tricuspid regurgitant velocity is 2.01 m/s, and  with an assumed right atrial pressure of 3 mmHg, the estimated right ventricular systolic pressure is 19.2 mmHg. Left Atrium: Left atrial size was normal in size. Right Atrium: Right atrial size was normal in size. Pericardium: A small pericardial effusion is present. The pericardial effusion is circumferential. Mitral Valve: No evidence of mitral valve prolapse. The mitral valve is normal in structure. Normal mobility of the mitral valve leaflets. Trivial mitral valve regurgitation. No evidence of mitral valve  stenosis. Tricuspid Valve: The tricuspid valve is normal in structure. Tricuspid valve regurgitation is mild . No evidence of tricuspid stenosis. Aortic Valve: The aortic valve is tricuspid. Aortic valve regurgitation is not visualized. No aortic stenosis is present. Pulmonic Valve: The pulmonic valve was normal in structure. Pulmonic valve regurgitation is not visualized. No evidence of pulmonic stenosis. Aorta: The aortic root is normal in size and structure. Venous: The inferior vena cava is normal in size with greater than 50% respiratory variability, suggesting right atrial pressure of 3 mmHg. IAS/Shunts: No atrial level shunt detected by color flow Doppler.  LEFT VENTRICLE PLAX 2D LVIDd:         4.50 cm     Diastology LVIDs:         3.10 cm     LV e' lateral:   7.62 cm/s LV PW:         0.80 cm     LV E/e' lateral: 7.7 LV IVS:        0.80 cm     LV e' medial:    5.68 cm/s LVOT diam:     1.80 cm     LV E/e' medial:  10.3 LV SV:         56 LV SV Index:   33 LVOT Area:     2.54 cm  LV Volumes (MOD) LV vol d, MOD A2C: 68.4 ml LV vol d, MOD A4C: 61.7 ml LV vol s, MOD A2C: 31.0 ml LV vol s, MOD A4C: 29.7 ml LV SV MOD A2C:     37.4 ml LV SV MOD A4C:     61.7 ml LV SV MOD BP:      36.2 ml RIGHT VENTRICLE RV S prime:     10.10 cm/s TAPSE (M-mode): 2.0 cm LEFT ATRIUM             Index       RIGHT ATRIUM           Index LA diam:        3.70 cm 2.19 cm/m  RA Area:     12.40 cm LA Vol (A2C):   26.9 ml 15.90 ml/m RA Volume:   27.60 ml  16.31 ml/m LA Vol (A4C):   27.4 ml 16.19 ml/m LA Biplane Vol: 29.8 ml 17.61 ml/m  AORTIC VALVE LVOT Vmax:   114.00 cm/s LVOT Vmean:  68.800 cm/s LVOT VTI:    0.222 m  AORTA Ao Root diam: 2.60 cm MITRAL VALVE               TRICUSPID VALVE  MV Area (PHT): 1.94 cm    TR Peak grad:   16.2 mmHg MV Decel Time: 391 msec    TR Vmax:        201.00 cm/s MV E velocity: 58.50 cm/s MV A velocity: 59.70 cm/s  SHUNTS MV E/A ratio:  0.98        Systemic VTI:  0.22 m                            Systemic  Diam: 1.80 cm Chilton Si MD Electronically signed by Chilton Si MD Signature Date/Time: 09/02/2019/5:36:33 PM    Final    VAS US CAROTID  Result Date: 09/03/2019 Carotid Arterial Duplex Study Indications:       CVA. Risk Factors:      Hypertension. Other Factors:     Acute infarct of the lateral right thalamus. Comparison Study:  none Performing Technologist: Jeb Levering RDMS, RVT  Examination Guidelines: A complete evaluation includes B-mode imaging, spectral Doppler, color Doppler, and power Doppler as needed of all accessible portions of each vessel. Bilateral testing is considered an integral part of a complete examination. Limited examinations for reoccurring indications may be performed as noted.  Right Carotid Findings: +----------+--------+--------+--------+------------------+--------+             PSV cm/s EDV cm/s Stenosis Plaque Description Comments  +----------+--------+--------+--------+------------------+--------+  CCA Prox   49       13                                             +----------+--------+--------+--------+------------------+--------+  CCA Distal 45       12                                             +----------+--------+--------+--------+------------------+--------+  ICA Prox   36       11                                             +----------+--------+--------+--------+------------------+--------+  ICA Distal 92       25                                             +----------+--------+--------+--------+------------------+--------+  ECA        54       10                                             +----------+--------+--------+--------+------------------+--------+ +----------+--------+-------+----------------+-------------------+             PSV cm/s EDV cms Describe         Arm Pressure (mmHG)  +----------+--------+-------+----------------+-------------------+  Subclavian 75               Multiphasic, WNL                       +----------+--------+-------+----------------+-------------------+ +---------+--------+--+--------+--+---------+  Vertebral PSV cm/s 34 EDV cm/s 11 Antegrade  +---------+--------+--+--------+--+---------+  Left Carotid Findings: +----------+--------+--------+--------+------------------+--------+             PSV cm/s EDV cm/s Stenosis Plaque Description Comments  +----------+--------+--------+--------+------------------+--------+  CCA Prox   65       18                                             +----------+--------+--------+--------+------------------+--------+  CCA Distal 41       10                                             +----------+--------+--------+--------+------------------+--------+  ICA Prox   39       14                                             +----------+--------+--------+--------+------------------+--------+  ICA Distal 63       23                                             +----------+--------+--------+--------+------------------+--------+  ECA        54       5                                              +----------+--------+--------+--------+------------------+--------+ +----------+--------+--------+----------------+-------------------+             PSV cm/s EDV cm/s Describe         Arm Pressure (mmHG)  +----------+--------+--------+----------------+-------------------+  Subclavian 65                Multiphasic, WNL                      +----------+--------+--------+----------------+-------------------+ +---------+--------+--+--------+--+---------+  Vertebral PSV cm/s 42 EDV cm/s 15 Antegrade  +---------+--------+--+--------+--+---------+   Summary: Right Carotid: The extracranial vessels were near-normal with only minimal wall                thickening or plaque. Left Carotid: The extracranial vessels were near-normal with only minimal wall               thickening or plaque.  *See table(s) above for measurements and observations.  Electronically signed by Lemar Livings MD on 09/03/2019 at  4:31:33 PM.    Final    CT Maxillofacial Wo Contrast  Result Date: 09/02/2019 CLINICAL DATA:  Facial trauma, hematoma above the LEFT eye EXAM: CT HEAD WITHOUT CONTRAST CT MAXILLOFACIAL WITHOUT CONTRAST TECHNIQUE: Multidetector CT imaging of the head and maxillofacial structures were performed using the standard protocol without intravenous contrast. Multiplanar CT image reconstructions of the maxillofacial structures were also generated. COMPARISON:  MRI brain of 12/03/2018 FINDINGS: CT HEAD FINDINGS Brain: No evidence of acute infarction, hemorrhage, hydrocephalus, extra-axial collection or mass lesion/mass effect. Signs of atrophy, chronic microvascular ischemic change in deep and periventricular white matter and of prior infarct. Vascular:  No hyperdense vessel or unexpected calcification. Skull: Normal. Negative for fracture or focal lesion. Other: LEFT supraorbital/frontal hematoma along the lateral margin of the LEFT orbit, see below. CT MAXILLOFACIAL FINDINGS Osseous: No fracture or mandibular dislocation. No destructive process. Orbits: Periorbital stranding on the LEFT mainly along the lateral and superior margin of the orbit. No retro-orbital fat stranding. Sinuses: Clear. Soft tissues: Periorbital hematoma on the LEFT. IMPRESSION: 1. No acute intracranial pathology. Atrophy, signs of prior infarct and evidence of chronic microvascular ischemic change similar to the prior study. 2. No acute facial bone fracture. 3. Periorbital hematoma on the LEFT mainly along the lateral and superior margin of the orbit. No retro-orbital fat stranding. Electronically Signed   By: Donzetta Kohut M.D.   On: 09/02/2019 12:00      Huey Bienenstock M.D on 09/04/2019 at 4:42 PM    [facility] -  Office  [phone]

## 2019-09-05 DIAGNOSIS — H05232 Hemorrhage of left orbit: Secondary | ICD-10-CM

## 2019-09-05 LAB — SARS CORONAVIRUS 2 BY RT PCR (HOSPITAL ORDER, PERFORMED IN ~~LOC~~ HOSPITAL LAB): SARS Coronavirus 2: NEGATIVE

## 2019-09-05 MED ORDER — ASPIRIN 81 MG PO CHEW
81.0000 mg | CHEWABLE_TABLET | Freq: Every day | ORAL | 0 refills | Status: AC
Start: 2019-09-05 — End: 2019-09-24

## 2019-09-05 MED ORDER — PANTOPRAZOLE SODIUM 40 MG PO TBEC
40.0000 mg | DELAYED_RELEASE_TABLET | Freq: Every day | ORAL | 0 refills | Status: DC
Start: 2019-09-05 — End: 2019-10-30

## 2019-09-05 MED ORDER — ATORVASTATIN CALCIUM 20 MG PO TABS: 20.0000 mg | ORAL_TABLET | Freq: Every morning | ORAL | Status: DC

## 2019-09-05 MED ORDER — CLOPIDOGREL BISULFATE 75 MG PO TABS: 75.0000 mg | ORAL_TABLET | Freq: Every day | ORAL | Status: DC

## 2019-09-05 NOTE — Discharge Instructions (Signed)
Follow with Primary MD Mahlon Gammon, MD   Get CBC, CMP,  checked  by Primary MD next visit.    Activity: As tolerated with Full fall precautions use walker/cane & assistance as needed   Disposition SNF   Diet: Heart Healthy  , with feeding assistance and aspiration precautions.   On your next visit with your primary care physician please Get Medicines reviewed and adjusted.   Please request your Prim.MD to go over all Hospital Tests and Procedure/Radiological results at the follow up, please get all Hospital records sent to your Prim MD by signing hospital release before you go home.   If you experience worsening of your admission symptoms, develop shortness of breath, life threatening emergency, suicidal or homicidal thoughts you must seek medical attention immediately by calling 911 or calling your MD immediately  if symptoms less severe.  You Must read complete instructions/literature along with all the possible adverse reactions/side effects for all the Medicines you take and that have been prescribed to you. Take any new Medicines after you have completely understood and accpet all the possible adverse reactions/side effects.   Do not drive, operating heavy machinery, perform activities at heights, swimming or participation in water activities or provide baby sitting services if your were admitted for syncope or siezures until you have seen by Primary MD or a Neurologist and advised to do so again.  Do not drive when taking Pain medications.    Do not take more than prescribed Pain, Sleep and Anxiety Medications  Special Instructions: If you have smoked or chewed Tobacco  in the last 2 yrs please stop smoking, stop any regular Alcohol  and or any Recreational drug use.  Wear Seat belts while driving.   Please note  You were cared for by a hospitalist during your hospital stay. If you have any questions about your discharge medications or the care you received while you  were in the hospital after you are discharged, you can call the unit and asked to speak with the hospitalist on call if the hospitalist that took care of you is not available. Once you are discharged, your primary care physician will handle any further medical issues. Please note that NO REFILLS for any discharge medications will be authorized once you are discharged, as it is imperative that you return to your primary care physician (or establish a relationship with a primary care physician if you do not have one) for your aftercare needs so that they can reassess your need for medications and monitor your lab values.

## 2019-09-05 NOTE — Discharge Summary (Addendum)
Adriana [name], is a 85 y.o. female  DOB 30-Mar-1934  MRN [id number].  Admission date:  09/02/2019  Admitting Physician  Ollen Bowl, MD  Discharge Date:  09/05/2019   Primary MD  Mahlon Gammon, MD  Recommendations for primary care physician for things to follow:  -Please check CBC, BMP in 3 days. -Patient to follow with neurology as an outpatient -Patient to continue with his dual antiplatelet therapy aspirin and Plavix for 3 weeks, then Plavix alone after 3 weeks.   Admission Diagnosis  Fall, initial encounter [W19.XXXA] Periorbital hematoma of left eye [H05.232] Ischemic stroke [facility]) [I63.9] Cerebrovascular accident (CVA), unspecified mechanism (HCC) [I63.9]   Discharge Diagnosis  Fall, initial encounter [W19.XXXA] Periorbital hematoma of left eye [H05.232] Ischemic stroke (HCC) [I63.9] Cerebrovascular accident (CVA), unspecified mechanism (HCC) [I63.9]    Active Problems:   Depression with anxiety   Hypertension   CVA (cerebral vascular accident) (HCC)   Diastolic CHF (HCC)   Ischemic stroke (HCC)      Past Medical History:  Diagnosis Date  . Anginal pain (HCC) 08/24/2010   Echo-EF =>55%,LV normal  . Arthritis   . Cataract 12/2006   Both eyes  Epes MD  . Chest pain, unspecified 09/04/2007   Lexiscan-- EF 72%; LV normal  . Constipation    Takes flax seed and honey .  Smooth Move tea.  . Depression   . Diverticulitis   . Head injury, closed    with fall  . Hypertension   . Mitral valve prolapse   . Osteopenia   . Stroke [facility])     Past Surgical History:  Procedure Laterality Date  . ANTERIOR CERVICAL DECOMP/DISCECTOMY FUSION N/A 10/23/2012   Procedure: Cervical Five to Cervical Six, Cervical Six to Cervical Seven anterior cervical decompression with fusion plating and bonegraft;  Surgeon: Hewitt Shorts, MD;  Location: [facility];  Service: Neurosurgery;  Laterality: N/A;   ANTERIOR CERVICAL DECOMPRESSION/DISCECTOMY FUSION 2 LEVELS  . BREAST CYST ASPIRATION  1990   benign  . CARDIAC CATHETERIZATION  08/01/2010   normal coronaries  . COLONOSCOPY  approx 2011  . EYE SURGERY Bilateral 2011  . intestinal blockage surgery  Feb. 2012   "kink in small intestine" per pt       History of present illness and  Hospital Course:     Kindly see H&P for history of present illness and admission details, please review complete Labs, Consult reports and Test reports for all details in brief  HPI  from the history and physical done on the day of admission 09/02/2019   HPI: Adriana [name] is a 85 y.o. female with medical history significant of hypertension, CVA with some residual blurry vision in September 2020, hyperlipidemia, depression, cervical spondylosis presents to emergency department for evaluation of left-sided weakness since 3 to 4 days and fall this morning.  Patient tells me that since couple of days he has been unsteady and feels as if she is leaning towards left side.  This morning around 6  AM she fell on the ground and hit on her head.  Denies lightheadedness, dizziness, headache, change in vision prior or after the fall.  No history of loss of consciousness, seizure, slurred speech, facial droop.  Reports unsteady gait and left-sided weakness since couple of days.  She was seen by ophthalmologist outpatient for follow-up and her eyes were dilated for examination.  She thought that her unsteadiness is because of eye dilation.  No history of chest pain, shortness of breath, palpitation, leg swelling, fever, chills, cough, congestion, abdominal pain, urinary or bowel changes.  She lives alone at home.  Uses walker for ambulation.  No history of smoking, alcohol, listed drug use.  ED Course: Upon arrival to ED: Patient heart rate in 50s, other vitals within normal limits.  Afebrile with no leukocytosis.  Basic labs such as CBC, CMP, UA: Negative, PT INR,  APTT, ethanol, UDS: Pending.  CT head without contrast obtained which shows no acute intracranial pathology.  Atrophy, signs of prior infarct and evidence of chronic microvascular ischemic changes similar to prior study.  Periorbital hematoma on the left.  EDP consulted neurology recommended MRI brain.  [facility] hospitalist consulted for admission for stroke work-up.  Hospital Course   Acute CVA -MRI showing small right thalamic infarct , Small vessel disease. Old L occipital infarct. -MRA showing single short severe distal R P2 stenosis -Carotid Dopplers B ICA 1-39% stenosis, VAs antegrade  -2D echo with a preserved EF 55 to 60%, with no source of embolus -LDL 73, she is on statin -Hemoglobin  A1c is 5.3 -PT/OT/SLP consulted.  Recommendation for SNF placement -Continue with dual antiplatelet therapy aspirin and Plavix for 3 weeks, then Plavix alone per neuro recommendation.  Left periorbital hematoma: -Status post fall  Hypertension:  -Symptoms been going on for few days, she is back on her hypertensive regimen  Hyperlipidemia: -LDL is 73, she is on Lipitor 10 mg at home, currently on 20 mg, continue on discharge  Depression/anxiety: Continue Zoloft  Osteopenia/osteoporosis: Continue Fosamax  Chronic diastolic congestive heart failure: Stable -Patient appears euvolemic on exam. -Reviewed echo from 02/11/2019 which showed grade 1 diastolic dysfunction -Patient is not on diuretics.  Hypokalemia -Repleted    Discharge Condition:  stable   Follow UP   Follow-up Information    Penumalli, Glenford Bayley, MD. Schedule an appointment as soon as possible for a visit in 4 week(s).   Specialties: Neurology, Radiology Contact information: [address] [place] [address] [phone]                 Discharge Instructions  and  Discharge Medications    Discharge Instructions    Ambulatory referral to Neurology   Complete by: As directed    Follow up  with Dr. Marjory Lies at [facility] in 4-6 weeks. Pt is Dr. Richrd Humbles pt in the past. Thanks.   Discharge instructions   Complete by: As directed    Follow with Primary MD Mahlon Gammon, MD   Get CBC, CMP,  checked  by Primary MD next visit.    Activity: As tolerated with Full fall precautions use walker/cane & assistance as needed   Disposition SNF   Diet: Heart Healthy  , with feeding assistance and aspiration precautions.   On your next visit with your primary care physician please Get Medicines reviewed and adjusted.   Please request your Prim.MD to go over all Hospital Tests and Procedure/Radiological results at the follow up, please get all Hospital records sent to  your Prim MD by signing hospital release before you go home.   If you experience worsening of your admission symptoms, develop shortness of breath, life threatening emergency, suicidal or homicidal thoughts you must seek medical attention immediately by calling 911 or calling your MD immediately  if symptoms less severe.  You Must read complete instructions/literature along with all the possible adverse reactions/side effects for all the Medicines you take and that have been prescribed to you. Take any new Medicines after you have completely understood and accpet all the possible adverse reactions/side effects.   Do not drive, operating heavy machinery, perform activities at heights, swimming or participation in water activities or provide baby sitting services if your were admitted for syncope or siezures until you have seen by Primary MD or a Neurologist and advised to do so again.  Do not drive when taking Pain medications.    Do not take more than prescribed Pain, Sleep and Anxiety Medications  Special Instructions: If you have smoked or chewed Tobacco  in the last 2 yrs please stop smoking, stop any regular Alcohol  and or any Recreational drug use.  Wear Seat belts while driving.   Please note  You were cared  for by a hospitalist during your hospital stay. If you have any questions about your discharge medications or the care you received while you were in the hospital after you are discharged, you can call the unit and asked to speak with the hospitalist on call if the hospitalist that took care of you is not available. Once you are discharged, your primary care physician will handle any further medical issues. Please note that NO REFILLS for any discharge medications will be authorized once you are discharged, as it is imperative that you return to your primary care physician (or establish a relationship with a primary care physician if you do not have one) for your aftercare needs so that they can reassess your need for medications and monitor your lab values.   Increase activity slowly   Complete by: As directed      Allergies as of 09/05/2019      Reactions   Contrast Media [iodinated Diagnostic Agents] Hives   Hives during IVP at urology office '02, requires 13 hr prep///a.[email or web address] Patient came thru ER and had a 1 hour pre-medication and did fine   Oxycodone Other (See Comments)   Decreased appetite.   Boniva [ibandronic Acid]    Nausea, vomiting, weakness   Codeine Diarrhea, Nausea And Vomiting   Darvocet [propoxyphene N-acetaminophen] Diarrhea, Nausea And Vomiting   Erythromycin Nausea And Vomiting      Medication List    STOP taking these medications   amoxicillin 500 MG capsule Commonly known as: AMOXIL     TAKE these medications   alendronate 70 MG tablet Commonly known as: FOSAMAX TAKE 1 TAB ONCE A WEEK, AT LEAST 30 MIN BEFORE 1ST FOOD.DO NOT LIE DOWN FOR 30 MIN AFTER TAKING. What changed: See the new instructions.   aspirin 81 MG chewable tablet Chew 1 tablet (81 mg total) by mouth daily for 19 days. Please stop on 09/24/2019 What changed: additional instructions   atorvastatin 20 MG tablet Commonly known as: LIPITOR Take 1 tablet (20 mg total) by mouth in the  morning. Start taking on: September 06, 2019 What changed:   medication strength  how much to take  when to take this   CALCIUM 600+D3 PO Take 600 mg by mouth daily.   clopidogrel 75 MG tablet  Commonly known as: PLAVIX Take 1 tablet (75 mg total) by mouth daily. Start taking on: September 06, 2019   lisinopril 5 MG tablet Commonly known as: ZESTRIL TAKE 1 TABLET ONCE DAILY. What changed: when to take this   metoprolol tartrate 25 MG tablet Commonly known as: LOPRESSOR TAKE 1 TABLET BY MOUTH TWICE DAILY.   NON FORMULARY daily as needed. Hemp lemon salve 500mg  Rub on back for pain   pantoprazole 40 MG tablet Commonly known as: Protonix Take 1 tablet (40 mg total) by mouth daily.   sertraline 50 MG tablet Commonly known as: ZOLOFT Take 1 tablet (50 mg total) by mouth daily. What changed: when to take this   Stool Softener 100 MG capsule Generic drug: docusate sodium Take 100 mg by mouth at bedtime.         Diet and Activity recommendation: See Discharge Instructions above   Consults obtained -  Neurology   Major procedures and Radiology Reports - PLEASE review detailed and final reports for all details, in brief -     CT Head Wo Contrast  Result Date: 09/02/2019 CLINICAL DATA:  Facial trauma, hematoma above the LEFT eye EXAM: CT HEAD WITHOUT CONTRAST CT MAXILLOFACIAL WITHOUT CONTRAST TECHNIQUE: Multidetector CT imaging of the head and maxillofacial structures were performed using the standard protocol without intravenous contrast. Multiplanar CT image reconstructions of the maxillofacial structures were also generated. COMPARISON:  MRI brain of 12/03/2018 FINDINGS: CT HEAD FINDINGS Brain: No evidence of acute infarction, hemorrhage, hydrocephalus, extra-axial collection or mass lesion/mass effect. Signs of atrophy, chronic microvascular ischemic change in deep and periventricular white matter and of prior infarct. Vascular: No hyperdense vessel or unexpected  calcification. Skull: Normal. Negative for fracture or focal lesion. Other: LEFT supraorbital/frontal hematoma along the lateral margin of the LEFT orbit, see below. CT MAXILLOFACIAL FINDINGS Osseous: No fracture or mandibular dislocation. No destructive process. Orbits: Periorbital stranding on the LEFT mainly along the lateral and superior margin of the orbit. No retro-orbital fat stranding. Sinuses: Clear. Soft tissues: Periorbital hematoma on the LEFT. IMPRESSION: 1. No acute intracranial pathology. Atrophy, signs of prior infarct and evidence of chronic microvascular ischemic change similar to the prior study. 2. No acute facial bone fracture. 3. Periorbital hematoma on the LEFT mainly along the lateral and superior margin of the orbit. No retro-orbital fat stranding. Electronically Signed   By: Donzetta KohutGeoffrey  Wile M.D.   On: 09/02/2019 12:00   MR ANGIO HEAD WO CONTRAST  Result Date: 09/03/2019 CLINICAL DATA:  Follow-up examination for acute stroke. EXAM: MRA HEAD WITHOUT CONTRAST TECHNIQUE: Angiographic images of the Circle of Willis were obtained using MRA technique without intravenous contrast. COMPARISON:  Prior MRI from 09/02/2019. FINDINGS: ANTERIOR CIRCULATION: Visualized distal cervical segments of the internal carotid arteries are widely patent with symmetric antegrade flow. Petrous, cavernous, and supraclinoid ICAs widely patent without no stenosis or other abnormality. Origin of the ophthalmic arteries patent. ICA termini well perfused. A1 segments patent bilaterally. Normal anterior communicating artery complex. Anterior cerebral arteries widely patent to their distal aspects without stenosis. M1 segments widely patent bilaterally. Left M1 bifurcates early. Distal MCA branches well perfused and symmetric. POSTERIOR CIRCULATION: Visualized vertebral arteries patent to the vertebrobasilar junction without stenosis. Neither PICA origin visualized. Basilar widely patent to its distal aspect without  stenosis. Superior cerebral arteries patent bilaterally. Both PCAs primarily supplied via the basilar. Left PCA widely patent to its distal aspect. There is a single short-segment severe distal right P2 stenosis (series 255, image 8), which  could contribute to the previously identified right thalamic infarct. Right PCA otherwise well perfused to its distal aspect. No aneurysm or other vascular abnormality. IMPRESSION: 1. Single short-segment severe distal right P2 stenosis, which could contribute to the previously identified right thalamic infarct. 2. Otherwise normal intracranial MRA. No other large vessel occlusion, hemodynamically significant stenosis, or other vascular abnormality. Electronically Signed   By: Rise Mu M.D.   On: 09/03/2019 20:04   MR BRAIN WO CONTRAST  Result Date: 09/02/2019 CLINICAL DATA:  Fall EXAM: MRI HEAD WITHOUT CONTRAST TECHNIQUE: Multiplanar, multiecho pulse sequences of the brain and surrounding structures were obtained without intravenous contrast. COMPARISON:  12/03/2018 FINDINGS: Brain: Small subcentimeter focus of mildly reduced diffusion is present at the lateral aspect of the right thalamus. There is no intracranial mass or significant mass effect. There is no hydrocephalus or extra-axial fluid collection. Prominence of the ventricles and sulci reflects stable parenchymal volume loss. Patchy and confluent areas of T2 hyperintensity in the supratentorial and pontine white matter are nonspecific but probably reflect moderate to advanced chronic microvascular ischemic changes. There is a chronic infarct of the left occipital lobe. Punctate focus of susceptibility hypointensity at the posterior left thalamus is compatible with chronic microhemorrhage or less likely mineralization. Vascular: Major vessel flow voids at the skull base are preserved. Skull and upper cervical spine: Normal marrow signal is preserved. Sinuses/Orbits: Minor mucosal thickening. Bilateral lens  replacements. Other: Sella is unremarkable.  Mastoid air cells are clear. IMPRESSION: Small acute infarct of the lateral right thalamus. Moderate to advanced chronic microvascular ischemic changes. Chronic left occipital infarct. Electronically Signed   By: Guadlupe Spanish M.D.   On: 09/02/2019 12:50   ECHOCARDIOGRAM COMPLETE  Result Date: 09/02/2019    ECHOCARDIOGRAM REPORT   Patient Name:   Adriana [name] Adriana [name] Date of Exam: 09/02/2019 Medical Rec #:  [id number]   Height:       66.0 in Accession #:    [id number]  Weight:       135.0 lb Date of Birth:  1934-12-21   BSA:          1.692 m Patient Age:    84 years    BP:           149/78 mmHg Patient Gender: F           HR:           52 bpm. Exam Location:  Inpatient Procedure: 2D Echo, Cardiac Doppler and Color Doppler Indications:    Stroke 434.91 / I163.9  History:        Patient has prior history of Echocardiogram examinations, most                 recent 02/11/2019. CHF, Stroke, Mitral Valve Prolapse; Risk                 Factors:Hypertension, Dyslipidemia and Non-Smoker. Pericardial                 effusion.  Sonographer:    Renella Cunas RDCS Referring Phys: [id number] Kasandra Knudsen PAHWANI IMPRESSIONS  1. Left ventricular ejection fraction, by estimation, is 55 to 60%. The left ventricle has normal function. The left ventricle has no regional wall motion abnormalities. Left ventricular diastolic parameters are consistent with Grade I diastolic dysfunction (impaired relaxation).  2. Right ventricular systolic function is normal. The right ventricular size is normal. There is normal pulmonary artery systolic pressure.  3. No evidence of mitral valve prolapse. The mitral valve is  normal in structure. Trivial mitral valve regurgitation. No evidence of mitral stenosis.  4. The aortic valve is tricuspid. Aortic valve regurgitation is not visualized. No aortic stenosis is present.  5. The inferior vena cava is normal in size with greater than 50% respiratory variability, suggesting  right atrial pressure of 3 mmHg. FINDINGS  Left Ventricle: Left ventricular ejection fraction, by estimation, is 55 to 60%. The left ventricle has normal function. The left ventricle has no regional wall motion abnormalities. The left ventricular internal cavity size was normal in size. There is  no left ventricular hypertrophy. Left ventricular diastolic parameters are consistent with Grade I diastolic dysfunction (impaired relaxation). Indeterminate filling pressures. Right Ventricle: The right ventricular size is normal. No increase in right ventricular wall thickness. Right ventricular systolic function is normal. There is normal pulmonary artery systolic pressure. The tricuspid regurgitant velocity is 2.01 m/s, and  with an assumed right atrial pressure of 3 mmHg, the estimated right ventricular systolic pressure is 19.2 mmHg. Left Atrium: Left atrial size was normal in size. Right Atrium: Right atrial size was normal in size. Pericardium: A small pericardial effusion is present. The pericardial effusion is circumferential. Mitral Valve: No evidence of mitral valve prolapse. The mitral valve is normal in structure. Normal mobility of the mitral valve leaflets. Trivial mitral valve regurgitation. No evidence of mitral valve stenosis. Tricuspid Valve: The tricuspid valve is normal in structure. Tricuspid valve regurgitation is mild . No evidence of tricuspid stenosis. Aortic Valve: The aortic valve is tricuspid. Aortic valve regurgitation is not visualized. No aortic stenosis is present. Pulmonic Valve: The pulmonic valve was normal in structure. Pulmonic valve regurgitation is not visualized. No evidence of pulmonic stenosis. Aorta: The aortic root is normal in size and structure. Venous: The inferior vena cava is normal in size with greater than 50% respiratory variability, suggesting right atrial pressure of 3 mmHg. IAS/Shunts: No atrial level shunt detected by color flow Doppler.  LEFT VENTRICLE PLAX 2D  LVIDd:         4.50 cm     Diastology LVIDs:         3.10 cm     LV e' lateral:   7.62 cm/s LV PW:         0.80 cm     LV E/e' lateral: 7.7 LV IVS:        0.80 cm     LV e' medial:    5.68 cm/s LVOT diam:     1.80 cm     LV E/e' medial:  10.3 LV SV:         56 LV SV Index:   33 LVOT Area:     2.54 cm  LV Volumes (MOD) LV vol d, MOD A2C: 68.4 ml LV vol d, MOD A4C: 61.7 ml LV vol s, MOD A2C: 31.0 ml LV vol s, MOD A4C: 29.7 ml LV SV MOD A2C:     37.4 ml LV SV MOD A4C:     61.7 ml LV SV MOD BP:      36.2 ml RIGHT VENTRICLE RV S prime:     10.10 cm/s TAPSE (M-mode): 2.0 cm LEFT ATRIUM             Index       RIGHT ATRIUM           Index LA diam:        3.70 cm 2.19 cm/m  RA Area:     12.40 cm LA Vol (A2C):  26.9 ml 15.90 ml/m RA Volume:   27.60 ml  16.31 ml/m LA Vol (A4C):   27.4 ml 16.19 ml/m LA Biplane Vol: 29.8 ml 17.61 ml/m  AORTIC VALVE LVOT Vmax:   114.00 cm/s LVOT Vmean:  68.800 cm/s LVOT VTI:    0.222 m  AORTA Ao Root diam: 2.60 cm MITRAL VALVE               TRICUSPID VALVE MV Area (PHT): 1.94 cm    TR Peak grad:   16.2 mmHg MV Decel Time: 391 msec    TR Vmax:        201.00 cm/s MV E velocity: 58.50 cm/s MV A velocity: 59.70 cm/s  SHUNTS MV E/A ratio:  0.98        Systemic VTI:  0.22 m                            Systemic Diam: 1.80 cm Chilton Si MD Electronically signed by Chilton Si MD Signature Date/Time: 09/02/2019/5:36:33 PM    Final    VAS US CAROTID  Result Date: 09/03/2019 Carotid Arterial Duplex Study Indications:       CVA. Risk Factors:      Hypertension. Other Factors:     Acute infarct of the lateral right thalamus. Comparison Study:  none Performing Technologist: Jeb Levering RDMS, RVT  Examination Guidelines: A complete evaluation includes B-mode imaging, spectral Doppler, color Doppler, and power Doppler as needed of all accessible portions of each vessel. Bilateral testing is considered an integral part of a complete examination. Limited examinations for reoccurring  indications may be performed as noted.  Right Carotid Findings: +----------+--------+--------+--------+------------------+--------+           PSV cm/sEDV cm/sStenosisPlaque DescriptionComments +----------+--------+--------+--------+------------------+--------+ CCA Prox  49      13                                         +----------+--------+--------+--------+------------------+--------+ CCA Distal45      12                                         +----------+--------+--------+--------+------------------+--------+ ICA Prox  36      11                                         +----------+--------+--------+--------+------------------+--------+ ICA Distal92      25                                         +----------+--------+--------+--------+------------------+--------+ ECA       54      10                                         +----------+--------+--------+--------+------------------+--------+ +----------+--------+-------+----------------+-------------------+           PSV cm/sEDV cmsDescribe        Arm Pressure (mmHG) +----------+--------+-------+----------------+-------------------+ [id number]             Multiphasic,  WNL                    +----------+--------+-------+----------------+-------------------+ +---------+--------+--+--------+--+---------+ VertebralPSV cm/s34EDV cm/s11Antegrade +---------+--------+--+--------+--+---------+  Left Carotid Findings: +----------+--------+--------+--------+------------------+--------+           PSV cm/sEDV cm/sStenosisPlaque DescriptionComments +----------+--------+--------+--------+------------------+--------+ CCA Prox  65      18                                         +----------+--------+--------+--------+------------------+--------+ CCA Distal41      10                                         +----------+--------+--------+--------+------------------+--------+ ICA Prox  39      14                                          +----------+--------+--------+--------+------------------+--------+ ICA Distal63      23                                         +----------+--------+--------+--------+------------------+--------+ ECA       54      5                                          +----------+--------+--------+--------+------------------+--------+ +----------+--------+--------+----------------+-------------------+           PSV cm/sEDV cm/sDescribe        Arm Pressure (mmHG) +----------+--------+--------+----------------+-------------------+ [id number]              Multiphasic, WNL                    +----------+--------+--------+----------------+-------------------+ +---------+--------+--+--------+--+---------+ VertebralPSV cm/s42EDV cm/s15Antegrade +---------+--------+--+--------+--+---------+   Summary: Right Carotid: The extracranial vessels were near-normal with only minimal wall                thickening or plaque. Left Carotid: The extracranial vessels were near-normal with only minimal wall               thickening or plaque.  *See table(s) above for measurements and observations.  Electronically signed by Lemar Livings MD on 09/03/2019 at 4:31:33 PM.    Final    CT Maxillofacial Wo Contrast  Result Date: 09/02/2019 CLINICAL DATA:  Facial trauma, hematoma above the LEFT eye EXAM: CT HEAD WITHOUT CONTRAST CT MAXILLOFACIAL WITHOUT CONTRAST TECHNIQUE: Multidetector CT imaging of the head and maxillofacial structures were performed using the standard protocol without intravenous contrast. Multiplanar CT image reconstructions of the maxillofacial structures were also generated. COMPARISON:  MRI brain of 12/03/2018 FINDINGS: CT HEAD FINDINGS Brain: No evidence of acute infarction, hemorrhage, hydrocephalus, extra-axial collection or mass lesion/mass effect. Signs of atrophy, chronic microvascular ischemic change in deep and periventricular white matter and of  prior infarct. Vascular: No hyperdense vessel or unexpected calcification. Skull: Normal. Negative for fracture or focal lesion. Other: LEFT supraorbital/frontal hematoma along the lateral margin of the LEFT orbit, see below. CT MAXILLOFACIAL FINDINGS Osseous: No fracture or mandibular dislocation. No destructive process. Orbits: Periorbital stranding on  the LEFT mainly along the lateral and superior margin of the orbit. No retro-orbital fat stranding. Sinuses: Clear. Soft tissues: Periorbital hematoma on the LEFT. IMPRESSION: 1. No acute intracranial pathology. Atrophy, signs of prior infarct and evidence of chronic microvascular ischemic change similar to the prior study. 2. No acute facial bone fracture. 3. Periorbital hematoma on the LEFT mainly along the lateral and superior margin of the orbit. No retro-orbital fat stranding. Electronically Signed   By: Donzetta Kohut M.D.   On: 09/02/2019 12:00    Micro Results    Recent Results (from the past 240 hour(s))  SARS Coronavirus 2 by RT PCR (hospital order, performed in [facility] hospital lab) Nasopharyngeal Nasopharyngeal Swab     Status: None   Collection Time: 09/02/19  2:05 PM   Specimen: Nasopharyngeal Swab  Result Value Ref Range Status   SARS Coronavirus 2 NEGATIVE NEGATIVE Final    Comment: (NOTE) SARS-CoV-2 target nucleic acids are NOT DETECTED.  The SARS-CoV-2 RNA is generally detectable in upper and lower respiratory specimens during the acute phase of infection. The lowest concentration of SARS-CoV-2 viral copies this assay can detect is 250 copies / mL. A negative result does not preclude SARS-CoV-2 infection and should not be used as the sole basis for treatment or other patient management decisions.  A negative result may occur with improper specimen collection / handling, submission of specimen other than nasopharyngeal swab, presence of viral mutation(s) within the areas targeted by this assay, and inadequate number of  viral copies (<250 copies / mL). A negative result must be combined with clinical observations, patient history, and epidemiological information.  Fact Sheet for Patients:   BoilerBrush.com.cy  Fact Sheet for Healthcare Providers: https://pope.com/  This test is not yet approved or  cleared by the [place] FDA and has been authorized for detection and/or diagnosis of SARS-CoV-2 by FDA under an Emergency Use Authorization (EUA).  This EUA will remain in effect (meaning this test can be used) for the duration of the COVID-19 declaration under Section 564(b)(1) of the Act, 21 U.S.C. section 360bbb-3(b)(1), unless the authorization is terminated or revoked sooner.  Performed at [facility], [address], [place], [place] [address]        Today   Subjective:   Adriana [name] today has no headache,no chest or abdominal pain, feels much better wants to go home today.   Objective:   Blood pressure 138/81, pulse 70, temperature 98.5 F (36.9 C), temperature source Oral, resp. rate 16, height 5\' 6"  (1.676 m), weight 61.2 kg, SpO2 96 %.   Intake/Output Summary (Last 24 hours) at 09/05/2019 1042 Last data filed at 09/04/2019 1622 Gross per 24 hour  Intake 240 ml  Output --  Net 240 ml    Exam Awake Alert, Oriented x 3, No new F.N deficits, Normal affect, left periorbital hematoma Symmetrical Chest wall movement, Good air movement bilaterally, CTAB RRR,No Gallops,Rubs or new Murmurs, No Parasternal Heave +ve B.Sounds, Abd Soft, Non tender, No organomegaly appriciated, No rebound -guarding or rigidity. No Cyanosis, Clubbing or edema, No new Rash or bruise  Data Review   CBC w Diff:  Lab Results  Component Value Date   WBC 8.3 09/04/2019   HGB 13.9 09/04/2019   HCT 41.9 09/04/2019   PLT 243 09/04/2019   LYMPHOPCT 21 09/03/2019   MONOPCT 11 09/03/2019   EOSPCT 5 09/03/2019   BASOPCT 1 09/03/2019    CMP:  Lab  Results  Component Value Date  NA 139 09/04/2019   NA 137 05/10/2017   K 3.5 09/04/2019   CL 105 09/04/2019   CL 101 05/10/2017   CO2 26 09/04/2019   CO2 31 05/10/2017   BUN 14 09/04/2019   BUN 16 05/10/2017   CREATININE 0.83 09/04/2019   CREATININE 0.72 06/30/2019   GLU 87 05/10/2017   PROT 6.9 09/02/2019   ALBUMIN 4.0 09/02/2019   BILITOT 1.0 09/02/2019   ALKPHOS 52 09/02/2019   AST 30 09/02/2019   ALT 15 09/02/2019  .   Total Time in preparing paper work, data evaluation and todays exam - 35 minutes  Huey Bienenstock M.D on 09/05/2019 at 10:42 AM  [facility]   Office  [phone]

## 2019-09-05 NOTE — Progress Notes (Signed)
Patient ready for discharge; report called to RN at [facility].  Patient assisted to dress; discharge instructions reviewed; copy of AVS given to transport; all belongings returned; patient discharged out via ambulance transport.

## 2019-09-05 NOTE — TOC Transition Note (Addendum)
Transition of Care [facility]) - CM/SW Discharge Note   Patient Details  Name: Adriana [name] MRN: [id number] Date of Birth: 01-24-1935  [facility]) CM/SW Contact:  Luiz Blare Phone Number: 09/05/2019, 10:49 AM   Clinical Narrative:     Patient will DC to: [facility] Family notified: Engineer, drilling by: Sharin Mons  RN, patient, patient's family, and facility notified of DC. Discharge Summary and FL2 sent to facility. RN to call report prior to discharge [phone] [facility] Room 3 ask for Leonor Liv RN). DC packet on chart. Ambulance transport requested for patient.   CSW will sign off for now as social work intervention is no longer needed. Please consult [place] again if new needs arise.   Final next level of care: Skilled Nursing Facility Barriers to Discharge: No Barriers Identified   Patient Goals and CMS Choice Patient states their goals for this hospitalization and ongoing recovery are:: Get better and go home. CMS Medicare.gov Compare Post Acute Care list provided to:: Patient Choice offered to / list presented to : Patient  Discharge Placement              Patient chooses bed at: [facility] Patient to be transferred to facility by: [facility] Name of family member notified: Raynelle Fanning Patient and family notified of of transfer: 09/05/19  Discharge Plan and Services In-house Referral: NA Discharge Planning Services: NA Post Acute Care Choice: Skilled Nursing Facility          DME Arranged: N/A DME Agency: NA       HH Arranged: NA HH Agency: NA        Social Determinants of Health (SDOH) Interventions     Readmission Risk Interventions Readmission Risk Prevention Plan 09/03/2019  Transportation Screening Complete  Some recent data might be hidden

## 2019-09-08 ENCOUNTER — Non-Acute Institutional Stay (SKILLED_NURSING_FACILITY): Payer: Medicare PPO | Admitting: Internal Medicine

## 2019-09-08 ENCOUNTER — Encounter: Payer: Self-pay | Admitting: Internal Medicine

## 2019-09-08 DIAGNOSIS — F418 Other specified anxiety disorders: Secondary | ICD-10-CM | POA: Diagnosis not present

## 2019-09-08 DIAGNOSIS — I10 Essential (primary) hypertension: Secondary | ICD-10-CM

## 2019-09-08 DIAGNOSIS — I3139 Other pericardial effusion (noninflammatory): Secondary | ICD-10-CM

## 2019-09-08 DIAGNOSIS — I313 Pericardial effusion (noninflammatory): Secondary | ICD-10-CM

## 2019-09-08 DIAGNOSIS — I639 Cerebral infarction, unspecified: Secondary | ICD-10-CM | POA: Diagnosis not present

## 2019-09-08 DIAGNOSIS — E785 Hyperlipidemia, unspecified: Secondary | ICD-10-CM | POA: Diagnosis not present

## 2019-09-08 NOTE — Progress Notes (Signed)
Provider:  Einar Crow MD Location:   [facility] Room Number: 3 Place of Service:  SNF (31)  PCP: Mahlon Gammon, MD Patient Care Team: Mahlon Gammon, MD as PCP - General (Internal Medicine) [name], MD as PCP - Cardiology (Cardiology) [name], [name], NP as Nurse Practitioner (Internal Medicine) Marzella Schlein., MD (Ophthalmology) Arminda Resides, MD as Consulting Physician (Dermatology) Arminda Resides, MD as Consulting Physician (Dermatology) Teodora Medici, MD as Consulting Physician (Gynecology)  Extended Emergency Contact Information Primary Emergency Contact: Aline Brochure [place] Mobile Phone: [phone] Relation: Daughter Secondary Emergency Contact: Black,Joel Mobile Phone: [phone] Relation: Son  Code Status: Managed Care Goals of Care: Advanced Directive information Advanced Directives 09/05/2019  Does Patient Have a Medical Advance Directive? Yes  Type of Advance Directive Living will  Does patient want to make changes to medical advance directive? No - Patient declined  Copy of Healthcare Power of Attorney in Chart? -  Pre-existing out of facility DNR order (yellow form or pink MOST form) -      Chief Complaint  Patient presents with  . Readmit To SNF    Admission    HPI: Patient is a 85 y.o. female seen today for admission to SNF for therapy  Was admitted in the hospital from 6/30 through 7/3 with acute CVA and left periorbital hematoma.  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 A detailed work-up including heart monitor was negative at that time. She also history has a history of diverticulitis, depression, asymptomatic moderate pericardial effusion.  Per patient she fell in the bathroom on day of admission.  Few days before that she was noticing some weakness on her left side including tilting on the that side.  She was going to talk to physical  therapy.  When she fell.  She hit her head on the left side.  She denies any syncope or dizziness.  She was able to call for help and was evaluated by the nurse.  Her MRI in the hospital showed small right thalamic infarct.  Small vessel disease.  MRA showed distal P2 stenosis.  Echo and carotid Dopplers were negative Per neurology her stroke was due to small vessel disease. Her Lipitor was increased for LDL of 73. She was started on dual platelet therapy for 3 weeks. Patient is now in SNF for therapy She states she is feeling much better.  Therapy has already cleared her to use her walker for transfers.  Did not have any acute complaints today.  Pain seems to be controlled no dizziness. Past Medical History:  Diagnosis Date  . Anginal pain (HCC) 08/24/2010   Echo-EF =>55%,LV normal  . Arthritis   . Cataract 12/2006   Both eyes  Epes MD  . Chest pain, unspecified 09/04/2007   Lexiscan-- EF 72%; LV normal  . Constipation    Takes flax seed and honey .  Smooth Move tea.  . Depression   . Diverticulitis   . Head injury, closed    with fall  . Hypertension   . Mitral valve prolapse   . Osteopenia   . Stroke [facility])    Past Surgical History:  Procedure Laterality Date  . ANTERIOR CERVICAL DECOMP/DISCECTOMY FUSION N/A 10/23/2012   Procedure: Cervical Five to Cervical Six, Cervical Six to Cervical Seven anterior cervical decompression with fusion plating and bonegraft;  Surgeon: Hewitt Shorts, MD;  Location: [facility];  Service: Neurosurgery;  Laterality: N/A;  ANTERIOR CERVICAL DECOMPRESSION/DISCECTOMY FUSION 2 LEVELS  . BREAST CYST ASPIRATION  1990   benign  . CARDIAC CATHETERIZATION  08/01/2010   normal coronaries  . COLONOSCOPY  approx 2011  . EYE SURGERY Bilateral 2011  . intestinal blockage surgery  Feb. 2012   "kink in small intestine" per pt    reports that she has never smoked. She has never used smokeless tobacco. She reports current alcohol use of about 4.0 standard  drinks of alcohol per week. She reports that she does not use drugs. Social History   Socioeconomic History  . Marital status: Widowed    Spouse name: Joe  . Number of children: 2  . Years of education: college  . Highest education level: Bachelor's degree (e.g., BA, AB, BS)  Occupational History    Comment: retired Runner, broadcasting/film/video  Tobacco Use  . Smoking status: Never Smoker  . Smokeless tobacco: Never Used  Vaping Use  . Vaping Use: Never used  Substance and Sexual Activity  . Alcohol use: Yes    Alcohol/week: 4.0 standard drinks    Types: 2 Glasses of wine, 2 Cans of beer per week    Comment: occasionally  . Drug use: No  . Sexual activity: Yes  Other Topics Concern  . Not on file  Social History Narrative   Diet:  Normal   Do you drink/eat things with caffeine?   Yes   Marital status:  Widow                            What year were you married? 1986 and 1960   Do you live in a house, apartment, assisted living, condo, trailer, etc)?  Apartment at [facility], independent living   Is it one or more stories? 1+   How many persons live in your home?  1   Do you have any pets in your home?  No   Current or past profession:  Runner, broadcasting/film/video, Environmental health practitioner at [employer] you exercise?   Yes                                                 Type & how often: Stretching,  Strengthening   Do you have a living will?  Yes   Do you have a DNR Form?   Do you have a POA/HPOA forms?  HPOA   Social Determinants of Health   Financial Resource Strain:   . Difficulty of Paying Living Expenses:   Food Insecurity:   . Worried About Programme researcher, broadcasting/film/video in the Last Year:   . Barista in the Last Year:   Transportation Needs:   . Freight forwarder (Medical):   Marland Kitchen Lack of Transportation (Non-Medical):   Physical Activity:   . Days of Exercise per Week:   . Minutes of Exercise per Session:   Stress:   . Feeling of Stress :   Social Connections:   . Frequency of  Communication with Friends and Family:   . Frequency of Social Gatherings with Friends and Family:   . Attends Religious Services:   . Active Member of Clubs or Organizations:   . Attends Banker Meetings:   Marland Kitchen Marital Status:   Intimate Partner Violence:   . Fear of  Current or Ex-Partner:   . Emotionally Abused:   Marland Kitchen Physically Abused:   . Sexually Abused:     Functional Status Survey:    Family History  Problem Relation Age of Onset  . Hypertension Mother   . Transient ischemic attack Mother   . Parkinson's disease Mother   . Dementia Mother   . CVA Mother   . Diabetes Father   . Coronary artery disease Father   . Heart failure Father   . Cancer Father        prostate  . Depression Father   . Congestive Heart Failure Father   . Prostate cancer Brother   . Diabetes Brother   . Prostate cancer Paternal Grandmother     Health Maintenance  Topic Date Due  . Janet Berlin  07/28/2019  . INFLUENZA VACCINE  10/04/2019  . DEXA SCAN  Completed  . COVID-19 Vaccine  Completed  . PNA vac Low Risk Adult  Completed    Allergies  Allergen Reactions  . Contrast Media [Iodinated Diagnostic Agents] Hives    Hives during IVP at urology office '02, requires 13 hr prep///a.[email or web address]  Patient came thru ER and had a 1 hour pre-medication and did fine  . Oxycodone Other (See Comments)    Decreased appetite.  Sandrea Hammond [Ibandronic Acid]     Nausea, vomiting, weakness  . Codeine Diarrhea and Nausea And Vomiting  . Darvocet [Propoxyphene N-Acetaminophen] Diarrhea and Nausea And Vomiting  . Erythromycin Nausea And Vomiting    Allergies as of 09/08/2019      Reactions   Contrast Media [iodinated Diagnostic Agents] Hives   Hives during IVP at urology office '02, requires 13 hr prep///a.[email or web address] Patient came thru ER and had a 1 hour pre-medication and did fine   Oxycodone Other (See Comments)   Decreased appetite.   Boniva [ibandronic Acid]    Nausea, vomiting, weakness    Codeine Diarrhea, Nausea And Vomiting   Darvocet [propoxyphene N-acetaminophen] Diarrhea, Nausea And Vomiting   Erythromycin Nausea And Vomiting      Medication List       Accurate as of September 08, 2019 10:08 AM. If you have any questions, ask your nurse or doctor.        alendronate 70 MG tablet Commonly known as: FOSAMAX TAKE 1 TAB ONCE A WEEK, AT LEAST 30 MIN BEFORE 1ST FOOD.DO NOT LIE DOWN FOR 30 MIN AFTER TAKING. What changed: See the new instructions.   aspirin 81 MG chewable tablet Chew 1 tablet (81 mg total) by mouth daily for 19 days. Please stop on 09/24/2019   atorvastatin 20 MG tablet Commonly known as: LIPITOR Take 1 tablet (20 mg total) by mouth in the morning.   CALCIUM 600+D3 PO Take 600 mg by mouth daily.   clopidogrel 75 MG tablet Commonly known as: PLAVIX Take 1 tablet (75 mg total) by mouth daily.   lisinopril 5 MG tablet Commonly known as: ZESTRIL TAKE 1 TABLET ONCE DAILY. What changed: when to take this   metoprolol tartrate 25 MG tablet Commonly known as: LOPRESSOR TAKE 1 TABLET BY MOUTH TWICE DAILY.   NON FORMULARY daily as needed. Hemp lemon salve 500mg  Rub on back for pain   pantoprazole 40 MG tablet Commonly known as: Protonix Take 1 tablet (40 mg total) by mouth daily.   sertraline 50 MG tablet Commonly known as: ZOLOFT Take 1 tablet (50 mg total) by mouth daily. What changed: when to take this   Stool Softener 100 MG capsule Generic  drug: docusate sodium Take 100 mg by mouth at bedtime.       Review of Systems  Constitutional: Positive for activity change.  HENT: Negative.   Eyes: Positive for visual disturbance.  Respiratory: Negative.   Cardiovascular: Negative.   Gastrointestinal: Positive for constipation.  Genitourinary: Negative.   Musculoskeletal: Positive for gait problem.  Neurological: Positive for weakness.  Psychiatric/Behavioral: Positive for dysphoric mood.    Vitals:   09/08/19 1004  BP: (!) 158/88    Pulse: (!) 58  Resp: 20  Temp: (!) 97.4 F (36.3 C)  SpO2: 97%  Weight: 134 lb 14.4 oz (61.2 kg)  Height:  (1.676 m)   Body mass index is 21.77 kg/m. Physical Exam Vitals reviewed.  Constitutional:      Appearance: Normal appearance.  HENT:     Head: Normocephalic.     Comments: Has Periorbital Hematoma Extending down to the her Cheek Bone    Nose: Nose normal.     Mouth/Throat:     Mouth: Mucous membranes are moist.     Pharynx: Oropharynx is clear.  Eyes:     Pupils: Pupils are equal, round, and reactive to light.  Cardiovascular:     Rate and Rhythm: Normal rate and regular rhythm.     Pulses: Normal pulses.  Pulmonary:     Effort: Pulmonary effort is normal.     Breath sounds: Normal breath sounds.  Abdominal:     General: Abdomen is flat. Bowel sounds are normal.     Palpations: Abdomen is soft.  Musculoskeletal:        General: No swelling.     Cervical back: Neck supple.  Skin:    General: Skin is warm.  Neurological:     Mental Status: She is alert and oriented to person, place, and time.     Comments: Mild weakness in Left LE 4/5 All other Extremities were normal strength  Psychiatric:        Mood and Affect: Mood normal.        Thought Content: Thought content normal.     Labs reviewed: Basic Metabolic Panel: Recent Labs    09/02/19 1130 09/03/19 0331 09/04/19 0437  NA 140 138 139  K 4.0 3.4* 3.5  CL 105 105 105  CO2 GLUCOSE 102* 95 105*  BUN CREATININE 0.75 1.01* 0.83  CALCIUM 10.0 9.5 9.4   Liver Function Tests: Recent Labs    12/02/18 1809 12/30/18 1020 05/12/19 0745 06/30/19 0715 09/02/19 1130  AST 31   < > 42*  42* 20 30  ALT 16   < > ALKPHOS 57  --   --   --  52  BILITOT 0.9   < > 0.7  0.7 0.4 1.0  PROT 7.8   < > 6.9  6.9 6.6 6.9  ALBUMIN 4.5  --   --   --  4.0   < > = values in this interval not displayed.   Recent Labs    05/12/19 0745 06/30/19 0715  LIPASE 81* 25   No  results for input(s): AMMONIA in the last 8760 hours. CBC: Recent Labs    12/02/18 1809 12/02/18 1829 09/02/19 1130 09/03/19 0331 09/04/19 0437  WBC 7.5   < > 8.1 6.6 8.3  NEUTROABS 5.0  --  6.0 4.1  --   HGB 14.4   < > 13.5 12.8 13.9  HCT 45.2   < >  41.6 39.3 41.9  MCV 98.3   < > 94.5 93.8 93.9  PLT 273   < > 236 220 243   < > = values in this interval not displayed.   Cardiac Enzymes: No results for input(s): CKTOTAL, CKMB, CKMBINDEX, TROPONINI in the last 8760 hours. BNP: Invalid input(s): POCBNP Lab Results  Component Value Date   HGBA1C 5.3 09/03/2019   Lab Results  Component Value Date   TSH 4.05 06/30/2019   No results found for: VITAMINB12 No results found for: FOLATE No results found for: IRON, TIBC, FERRITIN  Imaging and Procedures obtained prior to SNF admission: CT Head Wo Contrast  Result Date: 09/02/2019 CLINICAL DATA:  Facial trauma, hematoma above the LEFT eye EXAM: CT HEAD WITHOUT CONTRAST CT MAXILLOFACIAL WITHOUT CONTRAST TECHNIQUE: Multidetector CT imaging of the head and maxillofacial structures were performed using the standard protocol without intravenous contrast. Multiplanar CT image reconstructions of the maxillofacial structures were also generated. COMPARISON:  MRI brain of 12/03/2018 FINDINGS: CT HEAD FINDINGS Brain: No evidence of acute infarction, hemorrhage, hydrocephalus, extra-axial collection or mass lesion/mass effect. Signs of atrophy, chronic microvascular ischemic change in deep and periventricular white matter and of prior infarct. Vascular: No hyperdense vessel or unexpected calcification. Skull: Normal. Negative for fracture or focal lesion. Other: LEFT supraorbital/frontal hematoma along the lateral margin of the LEFT orbit, see below. CT MAXILLOFACIAL FINDINGS Osseous: No fracture or mandibular dislocation. No destructive process. Orbits: Periorbital stranding on the LEFT mainly along the lateral and superior margin of the orbit. No  retro-orbital fat stranding. Sinuses: Clear. Soft tissues: Periorbital hematoma on the LEFT. IMPRESSION: 1. No acute intracranial pathology. Atrophy, signs of prior infarct and evidence of chronic microvascular ischemic change similar to the prior study. 2. No acute facial bone fracture. 3. Periorbital hematoma on the LEFT mainly along the lateral and superior margin of the orbit. No retro-orbital fat stranding. Electronically Signed   By: Donzetta Kohut M.D.   On: 09/02/2019 12:00   MR ANGIO HEAD WO CONTRAST  Result Date: 09/03/2019 CLINICAL DATA:  Follow-up examination for acute stroke. EXAM: MRA HEAD WITHOUT CONTRAST TECHNIQUE: Angiographic images of the Circle of Willis were obtained using MRA technique without intravenous contrast. COMPARISON:  Prior MRI from 09/02/2019. FINDINGS: ANTERIOR CIRCULATION: Visualized distal cervical segments of the internal carotid arteries are widely patent with symmetric antegrade flow. Petrous, cavernous, and supraclinoid ICAs widely patent without no stenosis or other abnormality. Origin of the ophthalmic arteries patent. ICA termini well perfused. A1 segments patent bilaterally. Normal anterior communicating artery complex. Anterior cerebral arteries widely patent to their distal aspects without stenosis. M1 segments widely patent bilaterally. Left M1 bifurcates early. Distal MCA branches well perfused and symmetric. POSTERIOR CIRCULATION: Visualized vertebral arteries patent to the vertebrobasilar junction without stenosis. Neither PICA origin visualized. Basilar widely patent to its distal aspect without stenosis. Superior cerebral arteries patent bilaterally. Both PCAs primarily supplied via the basilar. Left PCA widely patent to its distal aspect. There is a single short-segment severe distal right P2 stenosis (series 255, image 8), which could contribute to the previously identified right thalamic infarct. Right PCA otherwise well perfused to its distal aspect. No  aneurysm or other vascular abnormality. IMPRESSION: 1. Single short-segment severe distal right P2 stenosis, which could contribute to the previously identified right thalamic infarct. 2. Otherwise normal intracranial MRA. No other large vessel occlusion, hemodynamically significant stenosis, or other vascular abnormality. Electronically Signed   By: Rise Mu M.D.   On: 09/03/2019 20:04  MR BRAIN WO CONTRAST  Result Date: 09/02/2019 CLINICAL DATA:  Fall EXAM: MRI HEAD WITHOUT CONTRAST TECHNIQUE: Multiplanar, multiecho pulse sequences of the brain and surrounding structures were obtained without intravenous contrast. COMPARISON:  12/03/2018 FINDINGS: Brain: Small subcentimeter focus of mildly reduced diffusion is present at the lateral aspect of the right thalamus. There is no intracranial mass or significant mass effect. There is no hydrocephalus or extra-axial fluid collection. Prominence of the ventricles and sulci reflects stable parenchymal volume loss. Patchy and confluent areas of T2 hyperintensity in the supratentorial and pontine white matter are nonspecific but probably reflect moderate to advanced chronic microvascular ischemic changes. There is a chronic infarct of the left occipital lobe. Punctate focus of susceptibility hypointensity at the posterior left thalamus is compatible with chronic microhemorrhage or less likely mineralization. Vascular: Major vessel flow voids at the skull base are preserved. Skull and upper cervical spine: Normal marrow signal is preserved. Sinuses/Orbits: Minor mucosal thickening. Bilateral lens replacements. Other: Sella is unremarkable.  Mastoid air cells are clear. IMPRESSION: Small acute infarct of the lateral right thalamus. Moderate to advanced chronic microvascular ischemic changes. Chronic left occipital infarct. Electronically Signed   By: Guadlupe Spanish M.D.   On: 09/02/2019 12:50   ECHOCARDIOGRAM COMPLETE  Result Date: 09/02/2019     ECHOCARDIOGRAM REPORT   Patient Name:   KHAMANI DANIELY Baldwin Date of Exam: 09/02/2019 Medical Rec #:  [id number]   Height:       66.0 in Accession #:    [id number]  Weight:       135.0 lb Date of Birth:  06/04/1934   BSA:          1.692 m Patient Age:    84 years    BP:           149/78 mmHg Patient Gender: F           HR:           52 bpm. Exam Location:  Inpatient Procedure: 2D Echo, Cardiac Doppler and Color Doppler Indications:    Stroke 434.91 / I163.9  History:        Patient has prior history of Echocardiogram examinations, most                 recent 02/11/2019. CHF, Stroke, Mitral Valve Prolapse; Risk                 Factors:Hypertension, Dyslipidemia and Non-Smoker. Pericardial                 effusion.  Sonographer:    Renella Cunas RDCS Referring Phys: [id number] Kasandra Knudsen PAHWANI IMPRESSIONS  1. Left ventricular ejection fraction, by estimation, is 55 to 60%. The left ventricle has normal function. The left ventricle has no regional wall motion abnormalities. Left ventricular diastolic parameters are consistent with Grade I diastolic dysfunction (impaired relaxation).  2. Right ventricular systolic function is normal. The right ventricular size is normal. There is normal pulmonary artery systolic pressure.  3. No evidence of mitral valve prolapse. The mitral valve is normal in structure. Trivial mitral valve regurgitation. No evidence of mitral stenosis.  4. The aortic valve is tricuspid. Aortic valve regurgitation is not visualized. No aortic stenosis is present.  5. The inferior vena cava is normal in size with greater than 50% respiratory variability, suggesting right atrial pressure of 3 mmHg. FINDINGS  Left Ventricle: Left ventricular ejection fraction, by estimation, is 55 to 60%. The left ventricle has normal function. The left ventricle  has no regional wall motion abnormalities. The left ventricular internal cavity size was normal in size. There is  no left ventricular hypertrophy. Left ventricular diastolic  parameters are consistent with Grade I diastolic dysfunction (impaired relaxation). Indeterminate filling pressures. Right Ventricle: The right ventricular size is normal. No increase in right ventricular wall thickness. Right ventricular systolic function is normal. There is normal pulmonary artery systolic pressure. The tricuspid regurgitant velocity is 2.01 m/s, and  with an assumed right atrial pressure of 3 mmHg, the estimated right ventricular systolic pressure is 19.2 mmHg. Left Atrium: Left atrial size was normal in size. Right Atrium: Right atrial size was normal in size. Pericardium: A small pericardial effusion is present. The pericardial effusion is circumferential. Mitral Valve: No evidence of mitral valve prolapse. The mitral valve is normal in structure. Normal mobility of the mitral valve leaflets. Trivial mitral valve regurgitation. No evidence of mitral valve stenosis. Tricuspid Valve: The tricuspid valve is normal in structure. Tricuspid valve regurgitation is mild . No evidence of tricuspid stenosis. Aortic Valve: The aortic valve is tricuspid. Aortic valve regurgitation is not visualized. No aortic stenosis is present. Pulmonic Valve: The pulmonic valve was normal in structure. Pulmonic valve regurgitation is not visualized. No evidence of pulmonic stenosis. Aorta: The aortic root is normal in size and structure. Venous: The inferior vena cava is normal in size with greater than 50% respiratory variability, suggesting right atrial pressure of 3 mmHg. IAS/Shunts: No atrial level shunt detected by color flow Doppler.  LEFT VENTRICLE PLAX 2D LVIDd:         4.50 cm     Diastology LVIDs:         3.10 cm     LV e' lateral:   7.62 cm/s LV PW:         0.80 cm     LV E/e' lateral: 7.7 LV IVS:        0.80 cm     LV e' medial:    5.68 cm/s LVOT diam:     1.80 cm     LV E/e' medial:  10.3 LV SV:         56 LV SV Index:   33 LVOT Area:     2.54 cm  LV Volumes (MOD) LV vol d, MOD A2C: 68.4 ml LV vol d, MOD  A4C: 61.7 ml LV vol s, MOD A2C: 31.0 ml LV vol s, MOD A4C: 29.7 ml LV SV MOD A2C:     37.4 ml LV SV MOD A4C:     61.7 ml LV SV MOD BP:      36.2 ml RIGHT VENTRICLE RV S prime:     10.10 cm/s TAPSE (M-mode): 2.0 cm LEFT ATRIUM             Index       RIGHT ATRIUM           Index LA diam:        3.70 cm 2.19 cm/m  RA Area:     12.40 cm LA Vol (A2C):   26.9 ml 15.90 ml/m RA Volume:   27.60 ml  16.31 ml/m LA Vol (A4C):   27.4 ml 16.19 ml/m LA Biplane Vol: 29.8 ml 17.61 ml/m  AORTIC VALVE LVOT Vmax:   114.00 cm/s LVOT Vmean:  68.800 cm/s LVOT VTI:    0.222 m  AORTA Ao Root diam: 2.60 cm MITRAL VALVE               TRICUSPID VALVE  MV Area (PHT): 1.94 cm    TR Peak grad:   16.2 mmHg MV Decel Time: 391 msec    TR Vmax:        201.00 cm/s MV E velocity: 58.50 cm/s MV A velocity: 59.70 cm/s  SHUNTS MV E/A ratio:  0.98        Systemic VTI:  0.22 m                            Systemic Diam: 1.80 cm Chilton Si MD Electronically signed by Chilton Si MD Signature Date/Time: 09/02/2019/5:36:33 PM    Final    VAS US CAROTID  Result Date: 09/03/2019 Carotid Arterial Duplex Study Indications:       CVA. Risk Factors:      Hypertension. Other Factors:     Acute infarct of the lateral right thalamus. Comparison Study:  none Performing Technologist: Jeb Levering RDMS, RVT  Examination Guidelines: A complete evaluation includes B-mode imaging, spectral Doppler, color Doppler, and power Doppler as needed of all accessible portions of each vessel. Bilateral testing is considered an integral part of a complete examination. Limited examinations for reoccurring indications may be performed as noted.  Right Carotid Findings: +----------+--------+--------+--------+------------------+--------+           PSV cm/sEDV cm/sStenosisPlaque DescriptionComments +----------+--------+--------+--------+------------------+--------+ CCA Prox  49      13                                          +----------+--------+--------+--------+------------------+--------+ CCA Distal45      12                                         +----------+--------+--------+--------+------------------+--------+ ICA Prox  36      11                                         +----------+--------+--------+--------+------------------+--------+ ICA Distal92      25                                         +----------+--------+--------+--------+------------------+--------+ ECA       54      10                                         +----------+--------+--------+--------+------------------+--------+ +----------+--------+-------+----------------+-------------------+           PSV cm/sEDV cmsDescribe        Arm Pressure (mmHG) +----------+--------+-------+----------------+-------------------+ [id number]             Multiphasic, WNL                    +----------+--------+-------+----------------+-------------------+ +---------+--------+--+--------+--+---------+ VertebralPSV cm/s34EDV cm/s11Antegrade +---------+--------+--+--------+--+---------+  Left Carotid Findings: +----------+--------+--------+--------+------------------+--------+           PSV cm/sEDV cm/sStenosisPlaque DescriptionComments +----------+--------+--------+--------+------------------+--------+ CCA Prox  65      18                                         +----------+--------+--------+--------+------------------+--------+  CCA Distal41      10                                         +----------+--------+--------+--------+------------------+--------+ ICA Prox  39      14                                         +----------+--------+--------+--------+------------------+--------+ ICA Distal63      23                                         +----------+--------+--------+--------+------------------+--------+ ECA       54      5                                           +----------+--------+--------+--------+------------------+--------+ +----------+--------+--------+----------------+-------------------+           PSV cm/sEDV cm/sDescribe        Arm Pressure (mmHG) +----------+--------+--------+----------------+-------------------+ [id number]              Multiphasic, WNL                    +----------+--------+--------+----------------+-------------------+ +---------+--------+--+--------+--+---------+ VertebralPSV cm/s42EDV cm/s15Antegrade +---------+--------+--+--------+--+---------+   Summary: Right Carotid: The extracranial vessels were near-normal with only minimal wall                thickening or plaque. Left Carotid: The extracranial vessels were near-normal with only minimal wall               thickening or plaque.  *See table(s) above for measurements and observations.  Electronically signed by Lemar Livings MD on 09/03/2019 at 4:31:33 PM.    Final    CT Maxillofacial Wo Contrast  Result Date: 09/02/2019 CLINICAL DATA:  Facial trauma, hematoma above the LEFT eye EXAM: CT HEAD WITHOUT CONTRAST CT MAXILLOFACIAL WITHOUT CONTRAST TECHNIQUE: Multidetector CT imaging of the head and maxillofacial structures were performed using the standard protocol without intravenous contrast. Multiplanar CT image reconstructions of the maxillofacial structures were also generated. COMPARISON:  MRI brain of 12/03/2018 FINDINGS: CT HEAD FINDINGS Brain: No evidence of acute infarction, hemorrhage, hydrocephalus, extra-axial collection or mass lesion/mass effect. Signs of atrophy, chronic microvascular ischemic change in deep and periventricular white matter and of prior infarct. Vascular: No hyperdense vessel or unexpected calcification. Skull: Normal. Negative for fracture or focal lesion. Other: LEFT supraorbital/frontal hematoma along the lateral margin of the LEFT orbit, see below. CT MAXILLOFACIAL FINDINGS Osseous: No fracture or mandibular dislocation. No destructive  process. Orbits: Periorbital stranding on the LEFT mainly along the lateral and superior margin of the orbit. No retro-orbital fat stranding. Sinuses: Clear. Soft tissues: Periorbital hematoma on the LEFT. IMPRESSION: 1. No acute intracranial pathology. Atrophy, signs of prior infarct and evidence of chronic microvascular ischemic change similar to the prior study. 2. No acute facial bone fracture. 3. Periorbital hematoma on the LEFT mainly along the lateral and superior margin of the orbit. No retro-orbital fat stranding. Electronically Signed   By: Donzetta Kohut M.D.   On: 09/02/2019 12:00    Assessment/Plan Acute Right Thalamic Stroke Dual  Therapy for 3 weeks then Plavix LDL less then 70 goal BP better controlled Continue on therapy Follow up with Neurology.  Essential hypertension BP Mildily elevated today Will continue Lisinopril and Metoprolol for now  Hyperlipidemia, unspecified hyperlipidemia type Lipitor increased Goal LDL less then 70 Pericardial effusion Resolved on Recent Echo Depression with anxiety On Zoloft Osteoporosis On Fosamax Periorbital Hematoma Pain Controlled  All her Labs today were in Normal Levels Family/ staff Communication:   Labs/tests ordered: Total time spent in this patient care encounter was  45_  minutes; greater than 50% of the visit spent counseling patient and staff, reviewing records , Labs and coordinating care for problems addressed at this encounter.

## 2019-09-08 NOTE — Progress Notes (Signed)
This encounter was created in error - please disregard.

## 2019-09-11 DIAGNOSIS — Z9181 History of falling: Secondary | ICD-10-CM | POA: Diagnosis not present

## 2019-09-11 DIAGNOSIS — R278 Other lack of coordination: Secondary | ICD-10-CM | POA: Diagnosis not present

## 2019-09-11 DIAGNOSIS — H6122 Impacted cerumen, left ear: Secondary | ICD-10-CM | POA: Diagnosis not present

## 2019-09-11 DIAGNOSIS — R2681 Unsteadiness on feet: Secondary | ICD-10-CM | POA: Diagnosis not present

## 2019-09-11 DIAGNOSIS — H05232 Hemorrhage of left orbit: Secondary | ICD-10-CM | POA: Diagnosis not present

## 2019-09-11 DIAGNOSIS — M159 Polyosteoarthritis, unspecified: Secondary | ICD-10-CM | POA: Diagnosis not present

## 2019-09-11 DIAGNOSIS — R41841 Cognitive communication deficit: Secondary | ICD-10-CM | POA: Diagnosis not present

## 2019-09-11 DIAGNOSIS — M6281 Muscle weakness (generalized): Secondary | ICD-10-CM | POA: Diagnosis not present

## 2019-09-11 DIAGNOSIS — I639 Cerebral infarction, unspecified: Secondary | ICD-10-CM | POA: Diagnosis not present

## 2019-09-14 DIAGNOSIS — R2681 Unsteadiness on feet: Secondary | ICD-10-CM | POA: Diagnosis not present

## 2019-09-14 DIAGNOSIS — H6122 Impacted cerumen, left ear: Secondary | ICD-10-CM | POA: Diagnosis not present

## 2019-09-14 DIAGNOSIS — R41841 Cognitive communication deficit: Secondary | ICD-10-CM | POA: Diagnosis not present

## 2019-09-14 DIAGNOSIS — H05232 Hemorrhage of left orbit: Secondary | ICD-10-CM | POA: Diagnosis not present

## 2019-09-14 DIAGNOSIS — M159 Polyosteoarthritis, unspecified: Secondary | ICD-10-CM | POA: Diagnosis not present

## 2019-09-14 DIAGNOSIS — R278 Other lack of coordination: Secondary | ICD-10-CM | POA: Diagnosis not present

## 2019-09-14 DIAGNOSIS — I639 Cerebral infarction, unspecified: Secondary | ICD-10-CM | POA: Diagnosis not present

## 2019-09-14 DIAGNOSIS — Z9181 History of falling: Secondary | ICD-10-CM | POA: Diagnosis not present

## 2019-09-14 DIAGNOSIS — M6281 Muscle weakness (generalized): Secondary | ICD-10-CM | POA: Diagnosis not present

## 2019-09-15 ENCOUNTER — Encounter: Payer: Self-pay | Admitting: Cardiology

## 2019-09-16 ENCOUNTER — Non-Acute Institutional Stay (SKILLED_NURSING_FACILITY): Payer: Medicare PPO | Admitting: Nurse Practitioner

## 2019-09-16 ENCOUNTER — Encounter: Payer: Self-pay | Admitting: Nurse Practitioner

## 2019-09-16 DIAGNOSIS — I63331 Cerebral infarction due to thrombosis of right posterior cerebral artery: Secondary | ICD-10-CM | POA: Diagnosis not present

## 2019-09-16 DIAGNOSIS — S0083XS Contusion of other part of head, sequela: Secondary | ICD-10-CM | POA: Diagnosis not present

## 2019-09-16 DIAGNOSIS — I341 Nonrheumatic mitral (valve) prolapse: Secondary | ICD-10-CM

## 2019-09-16 DIAGNOSIS — K5900 Constipation, unspecified: Secondary | ICD-10-CM | POA: Diagnosis not present

## 2019-09-16 DIAGNOSIS — I3139 Other pericardial effusion (noninflammatory): Secondary | ICD-10-CM

## 2019-09-16 DIAGNOSIS — I313 Pericardial effusion (noninflammatory): Secondary | ICD-10-CM

## 2019-09-16 DIAGNOSIS — F418 Other specified anxiety disorders: Secondary | ICD-10-CM | POA: Diagnosis not present

## 2019-09-16 DIAGNOSIS — E7800 Pure hypercholesterolemia, unspecified: Secondary | ICD-10-CM | POA: Diagnosis not present

## 2019-09-16 DIAGNOSIS — K219 Gastro-esophageal reflux disease without esophagitis: Secondary | ICD-10-CM | POA: Diagnosis not present

## 2019-09-16 DIAGNOSIS — M810 Age-related osteoporosis without current pathological fracture: Secondary | ICD-10-CM

## 2019-09-16 NOTE — Assessment & Plan Note (Signed)
Blood pressure is controlled, continue Lisinopril, Metoprolol . 

## 2019-09-16 NOTE — Assessment & Plan Note (Signed)
Continue diet, Colace.

## 2019-09-16 NOTE — Assessment & Plan Note (Signed)
Periorbital left lateral eye/eyebrow, healing.

## 2019-09-16 NOTE — Assessment & Plan Note (Addendum)
LDL 73 09/03/19, continue Atorvastatin.  

## 2019-09-16 NOTE — Assessment & Plan Note (Signed)
Stable, continue Pantoprazole.  

## 2019-09-16 NOTE — Assessment & Plan Note (Addendum)
09/02/19 MRA small right thalamic infarct, underwent neurology eval, will continue  ASA, Plavix x 3 wks, then Plavix daily. Mild left sided weakness with muscle strength 5/5. 09/17/19 continue therapy in [facility]

## 2019-09-16 NOTE — Progress Notes (Signed)
Location:   [facility] Room Number: 13 Place of Service:  SNF (31)  Provider: Chipper Oman NP  PCP: Mahlon Gammon, MD Patient Care Team: Mahlon Gammon, MD as PCP - General (Internal Medicine) [name], MD as PCP - Cardiology (Cardiology) Lowery Paullin X, NP as Nurse Practitioner (Internal Medicine) Marzella Schlein., MD (Ophthalmology) Arminda Resides, MD as Consulting Physician (Dermatology) Arminda Resides, MD as Consulting Physician (Dermatology) Teodora Medici, MD as Consulting Physician (Gynecology)  Extended Emergency Contact Information Primary Emergency Contact: Aline Brochure [place] Mobile Phone: [phone] Relation: Daughter Secondary Emergency Contact: Black,Joel Mobile Phone: [phone] Relation: Son  Code Status: DNR Goals of care:  Advanced Directive information Advanced Directives 09/16/2019  Does Patient Have a Medical Advance Directive? Yes  Type of Advance Directive Living will  Does patient want to make changes to medical advance directive? -  Copy of Healthcare Power of Attorney in Chart? -  Pre-existing out of facility DNR order (yellow form or pink MOST form) -     Allergies  Allergen Reactions  . Contrast Media [Iodinated Diagnostic Agents] Hives    Hives during IVP at urology office '02, requires 13 hr prep///a.calhoun  Patient came thru ER and had a 1 hour pre-medication and did fine  . Oxycodone Other (See Comments)    Decreased appetite.  Sandrea Hammond [Ibandronic Acid]     Nausea, vomiting, weakness  . Codeine Diarrhea and Nausea And Vomiting  . Darvocet [Propoxyphene N-Acetaminophen] Diarrhea and Nausea And Vomiting  . Erythromycin Nausea And Vomiting    Chief Complaint  Patient presents with  . Discharge Note    Discharge from SNF    HPI:  85 y.o. 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 The recently CVA with left sided weakness, MRA small right  thalamic infarct, underwent neurology eval, started ASA, Plavix x 3 wks, then Plavix daily, s/p fall left periorbital hematoma was admitted to [facility] for therapy following hospital stay 6/30-7/3. The patient has regained physical strength, ADL function, she is functioning at AL level with continuation of therapy   Facial contusion, healing  OP, takes Fosamax  Depression, takes Sertraline.   Pericardial effusion, resolved  Hyperlipidemia, takes Atorvastatin  HTN, takes Lisinopril, Metoprolol.   Constipation, takes Colace qd.  GERD, takes Pantoprazole.       Past Medical History:  Diagnosis Date  . Anginal pain (HCC) 08/24/2010   Echo-EF =>55%,LV normal  . Arthritis   . Cataract 12/2006   Both eyes  Epes MD  . Chest pain, unspecified 09/04/2007   Lexiscan-- EF 72%; LV normal  . Constipation    Takes flax seed and honey .  Smooth Move tea.  . Depression   . Diverticulitis   . Head injury, closed    with fall  . Hypertension   . Mitral valve prolapse   . Osteopenia   . Stroke [facility])     Past Surgical History:  Procedure Laterality Date  . ANTERIOR CERVICAL DECOMP/DISCECTOMY FUSION N/A 10/23/2012   Procedure: Cervical Five to Cervical Six, Cervical Six to Cervical Seven anterior cervical decompression with fusion plating and bonegraft;  Surgeon: Hewitt Shorts, MD;  Location: [facility];  Service: Neurosurgery;  Laterality: N/A;  ANTERIOR CERVICAL DECOMPRESSION/DISCECTOMY FUSION 2 LEVELS  . BREAST CYST ASPIRATION  1990   benign  . CARDIAC CATHETERIZATION  08/01/2010   normal coronaries  . COLONOSCOPY  approx 2011  . EYE SURGERY  Bilateral 2011  . intestinal blockage surgery  Feb. 2012   "kink in small intestine" per pt      reports that she has never smoked. She has never used smokeless tobacco. She reports current alcohol use of about 4.0 standard drinks of alcohol per week. She reports that she does not use drugs. Social History   Socioeconomic History  . Marital  status: Widowed    Spouse name: Joe  . Number of children: 2  . Years of education: college  . Highest education level: Bachelor's degree (e.g., BA, AB, BS)  Occupational History    Comment: retired Runner, broadcasting/film/video  Tobacco Use  . Smoking status: Never Smoker  . Smokeless tobacco: Never Used  Vaping Use  . Vaping Use: Never used  Substance and Sexual Activity  . Alcohol use: Yes    Alcohol/week: 4.0 standard drinks    Types: 2 Glasses of wine, 2 Cans of beer per week    Comment: occasionally  . Drug use: No  . Sexual activity: Yes  Other Topics Concern  . Not on file  Social History Narrative   Diet:  Normal   Do you drink/eat things with caffeine?   Yes   Marital status:  Widow                            What year were you married? 1986 and 1960   Do you live in a house, apartment, assisted living, condo, trailer, etc)?  Apartment at [facility], independent living   Is it one or more stories? 1+   How many persons live in your home?  1   Do you have any pets in your home?  No   Current or past profession:  Runner, broadcasting/film/video, Environmental health practitioner at [employer] you exercise?   Yes                                                 Type & how often: Stretching,  Strengthening   Do you have a living will?  Yes   Do you have a DNR Form?   Do you have a POA/HPOA forms?  HPOA   Social Determinants of Health   Financial Resource Strain:   . Difficulty of Paying Living Expenses:   Food Insecurity:   . Worried About Programme researcher, broadcasting/film/video in the Last Year:   . Barista in the Last Year:   Transportation Needs:   . Freight forwarder (Medical):   Marland Kitchen Lack of Transportation (Non-Medical):   Physical Activity:   . Days of Exercise per Week:   . Minutes of Exercise per Session:   Stress:   . Feeling of Stress :   Social Connections:   . Frequency of Communication with Friends and Family:   . Frequency of Social Gatherings with Friends and Family:   . Attends Religious Services:    . Active Member of Clubs or Organizations:   . Attends Banker Meetings:   Marland Kitchen Marital Status:   Intimate Partner Violence:   . Fear of Current or Ex-Partner:   . Emotionally Abused:   Marland Kitchen Physically Abused:   . Sexually Abused:    Functional Status Survey:    Allergies  Allergen Reactions  . Contrast Media [Iodinated Diagnostic  Agents] Hives    Hives during IVP at urology office '02, requires 13 hr prep///a.calhoun  Patient came thru ER and had a 1 hour pre-medication and did fine  . Oxycodone Other (See Comments)    Decreased appetite.  Sandrea Hammond [Ibandronic Acid]     Nausea, vomiting, weakness  . Codeine Diarrhea and Nausea And Vomiting  . Darvocet [Propoxyphene N-Acetaminophen] Diarrhea and Nausea And Vomiting  . Erythromycin Nausea And Vomiting    Pertinent  Health Maintenance Due  Topic Date Due  . INFLUENZA VACCINE  10/04/2019  . DEXA SCAN  Completed  . PNA vac Low Risk Adult  Completed    Medications: Allergies as of 09/16/2019      Reactions   Contrast Media [iodinated Diagnostic Agents] Hives   Hives during IVP at urology office '02, requires 13 hr prep///a.calhoun Patient came thru ER and had a 1 hour pre-medication and did fine   Oxycodone Other (See Comments)   Decreased appetite.   Boniva [ibandronic Acid]    Nausea, vomiting, weakness   Codeine Diarrhea, Nausea And Vomiting   Darvocet [propoxyphene N-acetaminophen] Diarrhea, Nausea And Vomiting   Erythromycin Nausea And Vomiting      Medication List       Accurate as of September 16, 2019 11:59 PM. If you have any questions, ask your nurse or doctor.        alendronate 70 MG tablet Commonly known as: FOSAMAX TAKE 1 TAB ONCE A WEEK, AT LEAST 30 MIN BEFORE 1ST FOOD.DO NOT LIE DOWN FOR 30 MIN AFTER TAKING. What changed: See the new instructions.   aspirin 81 MG chewable tablet Chew 1 tablet (81 mg total) by mouth daily for 19 days. Please stop on 09/24/2019   atorvastatin 20 MG  tablet Commonly known as: LIPITOR Take 1 tablet (20 mg total) by mouth in the morning.   CALCIUM 600+D3 PO Take 600 mg by mouth daily.   clopidogrel 75 MG tablet Commonly known as: PLAVIX Take 1 tablet (75 mg total) by mouth daily.   lisinopril 5 MG tablet Commonly known as: ZESTRIL TAKE 1 TABLET ONCE DAILY. What changed: when to take this   metoprolol tartrate 25 MG tablet Commonly known as: LOPRESSOR TAKE 1 TABLET BY MOUTH TWICE DAILY.   NON FORMULARY daily as needed. Hemp lemon salve 500mg  Rub on back for pain   pantoprazole 40 MG tablet Commonly known as: Protonix Take 1 tablet (40 mg total) by mouth daily.   sertraline 50 MG tablet Commonly known as: ZOLOFT Take 50 mg by mouth daily. What changed: Another medication with the same name was removed. Continue taking this medication, and follow the directions you see here. Changed by: Alicja Everitt X Lakena Sparlin, NP   Stool Softener 100 MG capsule Generic drug: docusate sodium Take 100 mg by mouth at bedtime.       Review of Systems  Constitutional: Negative for activity change, appetite change and fever.       Sometime feels shaky/weak in general, not focal weakness or tremor.   HENT: Positive for hearing loss. Negative for congestion, trouble swallowing and voice change.        L ear impacted ear wax.   Eyes: Negative for visual disturbance.  Respiratory: Positive for cough. Negative for chest tightness, shortness of breath and wheezing.        Chronic occasional dry cough  Cardiovascular: Positive for chest pain. Negative for palpitations and leg swelling.       Pain in chest wall  under the right breat is better.   Gastrointestinal: Negative for abdominal pain, constipation and nausea.  Genitourinary: Negative for dysuria, frequency and urgency.  Musculoskeletal: Positive for arthralgias, back pain and gait problem.  Skin: Negative for color change.  Neurological: Positive for weakness. Negative for speech difficulty,  light-headedness and headaches.       Memory lapses occasionally. Left sided weakness.   Psychiatric/Behavioral: Negative for behavioral problems and sleep disturbance. The patient is not nervous/anxious.        Depressed, not interested in activities, doesn't want to live like this    Vitals:   09/16/19 1638  Weight: 130 lb (59 kg)  Height:  (1.676 m)   Body mass index is 20.98 kg/m. Physical Exam Vitals and nursing note reviewed.  Constitutional:      Appearance: Normal appearance.  HENT:     Head: Normocephalic and atraumatic.     Ears:     Comments: Left ear impacted cerumen removed with ear lavage-irrigation only.     Nose: Nose normal.     Mouth/Throat:     Mouth: Mucous membranes are moist.  Eyes:     Extraocular Movements: Extraocular movements intact.     Conjunctiva/sclera: Conjunctivae normal.     Pupils: Pupils are equal, round, and reactive to light.     Comments: Lateral right eye peripheral vision field loss since CVA 11/2018  Cardiovascular:     Rate and Rhythm: Normal rate and regular rhythm.     Heart sounds: No murmur heard.      Comments: HR 54 bpm Pulmonary:     Breath sounds: No wheezing or rales.  Abdominal:     General: Bowel sounds are normal.     Palpations: Abdomen is soft.     Tenderness: There is no abdominal tenderness. There is no right CVA tenderness, left CVA tenderness or guarding.  Musculoskeletal:     Cervical back: Normal range of motion and neck supple.     Right lower leg: No edema.     Left lower leg: No edema.     Comments: Kyphoscoliosis.   Skin:    General: Skin is warm and dry.     Findings: Bruising present.     Comments: Bruise, contusion periorbital lateral left eye/eyebrow. Resolving ecchymoses left upper arm, left wrist/hand.   Neurological:     General: No focal deficit present.     Mental Status: She is alert and oriented to person, place, and time. Mental status is at baseline.     Motor: Weakness present.      Coordination: Coordination normal.     Gait: Gait abnormal.     Comments: Walker for ambulation. Left sided weakness with muscle strength 5/5  Psychiatric:        Mood and Affect: Mood normal.        Behavior: Behavior normal.     Labs reviewed: Basic Metabolic Panel: Recent Labs    09/02/19 1130 09/03/19 0331 09/04/19 0437  NA 140 138 139  K 4.0 3.4* 3.5  CL 105 105 105  CO2 GLUCOSE 102* 95 105*  BUN CREATININE 0.75 1.01* 0.83  CALCIUM 10.0 9.5 9.4   Liver Function Tests: Recent Labs    12/02/18 1809 12/30/18 1020 05/12/19 0745 06/30/19 0715 09/02/19 1130  AST 31   < > 42*  42* 20 30  ALT 16   < > ALKPHOS 57  --   --   --  52  BILITOT 0.9   < > 0.7  0.7 0.4 1.0  PROT 7.8   < > 6.9  6.9 6.6 6.9  ALBUMIN 4.5  --   --   --  4.0   < > = values in this interval not displayed.   Recent Labs    05/12/19 0745 06/30/19 0715  LIPASE 81* 25   No results for input(s): AMMONIA in the last 8760 hours. CBC: Recent Labs    12/02/18 1809 12/02/18 1829 09/02/19 1130 09/03/19 0331 09/04/19 0437  WBC 7.5   < > 8.1 6.6 8.3  NEUTROABS 5.0  --  6.0 4.1  --   HGB 14.4   < > 13.5 12.8 13.9  HCT 45.2   < > 41.6 39.3 41.9  MCV 98.3   < > 94.5 93.8 93.9  PLT 273   < > 236 220 243   < > = values in this interval not displayed.   Cardiac Enzymes: No results for input(s): CKTOTAL, CKMB, CKMBINDEX, TROPONINI in the last 8760 hours. BNP: Invalid input(s): POCBNP CBG: Recent Labs    12/02/18 1755  GLUCAP 93    Procedures and Imaging Studies During Stay: CT Head Wo Contrast  Result Date: 09/02/2019 CLINICAL DATA:  Facial trauma, hematoma above the LEFT eye EXAM: CT HEAD WITHOUT CONTRAST CT MAXILLOFACIAL WITHOUT CONTRAST TECHNIQUE: Multidetector CT imaging of the head and maxillofacial structures were performed using the standard protocol without intravenous contrast. Multiplanar CT image reconstructions of the maxillofacial structures  were also generated. COMPARISON:  MRI brain of 12/03/2018 FINDINGS: CT HEAD FINDINGS Brain: No evidence of acute infarction, hemorrhage, hydrocephalus, extra-axial collection or mass lesion/mass effect. Signs of atrophy, chronic microvascular ischemic change in deep and periventricular white matter and of prior infarct. Vascular: No hyperdense vessel or unexpected calcification. Skull: Normal. Negative for fracture or focal lesion. Other: LEFT supraorbital/frontal hematoma along the lateral margin of the LEFT orbit, see below. CT MAXILLOFACIAL FINDINGS Osseous: No fracture or mandibular dislocation. No destructive process. Orbits: Periorbital stranding on the LEFT mainly along the lateral and superior margin of the orbit. No retro-orbital fat stranding. Sinuses: Clear. Soft tissues: Periorbital hematoma on the LEFT. IMPRESSION: 1. No acute intracranial pathology. Atrophy, signs of prior infarct and evidence of chronic microvascular ischemic change similar to the prior study. 2. No acute facial bone fracture. 3. Periorbital hematoma on the LEFT mainly along the lateral and superior margin of the orbit. No retro-orbital fat stranding. Electronically Signed   By: Donzetta KohutGeoffrey  Wile M.D.   On: 09/02/2019 12:00   MR ANGIO HEAD WO CONTRAST  Result Date: 09/03/2019 CLINICAL DATA:  Follow-up examination for acute stroke. EXAM: MRA HEAD WITHOUT CONTRAST TECHNIQUE: Angiographic images of the Circle of Willis were obtained using MRA technique without intravenous contrast. COMPARISON:  Prior MRI from 09/02/2019. FINDINGS: ANTERIOR CIRCULATION: Visualized distal cervical segments of the internal carotid arteries are widely patent with symmetric antegrade flow. Petrous, cavernous, and supraclinoid ICAs widely patent without no stenosis or other abnormality. Origin of the ophthalmic arteries patent. ICA termini well perfused. A1 segments patent bilaterally. Normal anterior communicating artery complex. Anterior cerebral arteries  widely patent to their distal aspects without stenosis. M1 segments widely patent bilaterally. Left M1 bifurcates early. Distal MCA branches well perfused and symmetric. POSTERIOR CIRCULATION: Visualized vertebral arteries patent to the vertebrobasilar junction without stenosis. Neither PICA origin visualized. Basilar widely patent to its distal aspect without stenosis. Superior cerebral arteries patent bilaterally. Both PCAs primarily supplied via the basilar. Left  PCA widely patent to its distal aspect. There is a single short-segment severe distal right P2 stenosis (series 255, image 8), which could contribute to the previously identified right thalamic infarct. Right PCA otherwise well perfused to its distal aspect. No aneurysm or other vascular abnormality. IMPRESSION: 1. Single short-segment severe distal right P2 stenosis, which could contribute to the previously identified right thalamic infarct. 2. Otherwise normal intracranial MRA. No other large vessel occlusion, hemodynamically significant stenosis, or other vascular abnormality. Electronically Signed   By: Rise Mu M.D.   On: 09/03/2019 20:04   MR BRAIN WO CONTRAST  Result Date: 09/02/2019 CLINICAL DATA:  Fall EXAM: MRI HEAD WITHOUT CONTRAST TECHNIQUE: Multiplanar, multiecho pulse sequences of the brain and surrounding structures were obtained without intravenous contrast. COMPARISON:  12/03/2018 FINDINGS: Brain: Small subcentimeter focus of mildly reduced diffusion is present at the lateral aspect of the right thalamus. There is no intracranial mass or significant mass effect. There is no hydrocephalus or extra-axial fluid collection. Prominence of the ventricles and sulci reflects stable parenchymal volume loss. Patchy and confluent areas of T2 hyperintensity in the supratentorial and pontine white matter are nonspecific but probably reflect moderate to advanced chronic microvascular ischemic changes. There is a chronic infarct of the  left occipital lobe. Punctate focus of susceptibility hypointensity at the posterior left thalamus is compatible with chronic microhemorrhage or less likely mineralization. Vascular: Major vessel flow voids at the skull base are preserved. Skull and upper cervical spine: Normal marrow signal is preserved. Sinuses/Orbits: Minor mucosal thickening. Bilateral lens replacements. Other: Sella is unremarkable.  Mastoid air cells are clear. IMPRESSION: Small acute infarct of the lateral right thalamus. Moderate to advanced chronic microvascular ischemic changes. Chronic left occipital infarct. Electronically Signed   By: Guadlupe Spanish M.D.   On: 09/02/2019 12:50   ECHOCARDIOGRAM COMPLETE  Result Date: 09/02/2019    ECHOCARDIOGRAM REPORT   Patient Name:   RHYANNA SORCE Hulbert Date of Exam: 09/02/2019 Medical Rec #:  [id number]   Height:       66.0 in Accession #:    [id number]  Weight:       135.0 lb Date of Birth:  [birth year]   BSA:          1.692 m Patient Age:    84 years    BP:           149/78 mmHg Patient Gender: F           HR:           52 bpm. Exam Location:  Inpatient Procedure: 2D Echo, Cardiac Doppler and Color Doppler Indications:    Stroke 434.91 / I163.9  History:        Patient has prior history of Echocardiogram examinations, most                 recent 02/11/2019. CHF, Stroke, Mitral Valve Prolapse; Risk                 Factors:Hypertension, Dyslipidemia and Non-Smoker. Pericardial                 effusion.  Sonographer:    Renella Cunas RDCS Referring Phys: [id number] Kasandra Knudsen PAHWANI IMPRESSIONS  1. Left ventricular ejection fraction, by estimation, is 55 to 60%. The left ventricle has normal function. The left ventricle has no regional wall motion abnormalities. Left ventricular diastolic parameters are consistent with Grade I diastolic dysfunction (impaired relaxation).  2. Right ventricular systolic function is normal. The right ventricular  size is normal. There is normal pulmonary artery systolic pressure.  3. No  evidence of mitral valve prolapse. The mitral valve is normal in structure. Trivial mitral valve regurgitation. No evidence of mitral stenosis.  4. The aortic valve is tricuspid. Aortic valve regurgitation is not visualized. No aortic stenosis is present.  5. The inferior vena cava is normal in size with greater than 50% respiratory variability, suggesting right atrial pressure of 3 mmHg. FINDINGS  Left Ventricle: Left ventricular ejection fraction, by estimation, is 55 to 60%. The left ventricle has normal function. The left ventricle has no regional wall motion abnormalities. The left ventricular internal cavity size was normal in size. There is  no left ventricular hypertrophy. Left ventricular diastolic parameters are consistent with Grade I diastolic dysfunction (impaired relaxation). Indeterminate filling pressures. Right Ventricle: The right ventricular size is normal. No increase in right ventricular wall thickness. Right ventricular systolic function is normal. There is normal pulmonary artery systolic pressure. The tricuspid regurgitant velocity is 2.01 m/s, and  with an assumed right atrial pressure of 3 mmHg, the estimated right ventricular systolic pressure is 19.2 mmHg. Left Atrium: Left atrial size was normal in size. Right Atrium: Right atrial size was normal in size. Pericardium: A small pericardial effusion is present. The pericardial effusion is circumferential. Mitral Valve: No evidence of mitral valve prolapse. The mitral valve is normal in structure. Normal mobility of the mitral valve leaflets. Trivial mitral valve regurgitation. No evidence of mitral valve stenosis. Tricuspid Valve: The tricuspid valve is normal in structure. Tricuspid valve regurgitation is mild . No evidence of tricuspid stenosis. Aortic Valve: The aortic valve is tricuspid. Aortic valve regurgitation is not visualized. No aortic stenosis is present. Pulmonic Valve: The pulmonic valve was normal in structure. Pulmonic valve  regurgitation is not visualized. No evidence of pulmonic stenosis. Aorta: The aortic root is normal in size and structure. Venous: The inferior vena cava is normal in size with greater than 50% respiratory variability, suggesting right atrial pressure of 3 mmHg. IAS/Shunts: No atrial level shunt detected by color flow Doppler.  LEFT VENTRICLE PLAX 2D LVIDd:         4.50 cm     Diastology LVIDs:         3.10 cm     LV e' lateral:   7.62 cm/s LV PW:         0.80 cm     LV E/e' lateral: 7.7 LV IVS:        0.80 cm     LV e' medial:    5.68 cm/s LVOT diam:     1.80 cm     LV E/e' medial:  10.3 LV SV:         56 LV SV Index:   33 LVOT Area:     2.54 cm  LV Volumes (MOD) LV vol d, MOD A2C: 68.4 ml LV vol d, MOD A4C: 61.7 ml LV vol s, MOD A2C: 31.0 ml LV vol s, MOD A4C: 29.7 ml LV SV MOD A2C:     37.4 ml LV SV MOD A4C:     61.7 ml LV SV MOD BP:      36.2 ml RIGHT VENTRICLE RV S prime:     10.10 cm/s TAPSE (M-mode): 2.0 cm LEFT ATRIUM             Index       RIGHT ATRIUM           Index LA diam:  3.70 cm 2.19 cm/m  RA Area:     12.40 cm LA Vol (A2C):   26.9 ml 15.90 ml/m RA Volume:   27.60 ml  16.31 ml/m LA Vol (A4C):   27.4 ml 16.19 ml/m LA Biplane Vol: 29.8 ml 17.61 ml/m  AORTIC VALVE LVOT Vmax:   114.00 cm/s LVOT Vmean:  68.800 cm/s LVOT VTI:    0.222 m  AORTA Ao Root diam: 2.60 cm MITRAL VALVE               TRICUSPID VALVE MV Area (PHT): 1.94 cm    TR Peak grad:   16.2 mmHg MV Decel Time: 391 msec    TR Vmax:        201.00 cm/s MV E velocity: 58.50 cm/s MV A velocity: 59.70 cm/s  SHUNTS MV E/A ratio:  0.98        Systemic VTI:  0.22 m                            Systemic Diam: 1.80 cm Chilton Si MD Electronically signed by Chilton Si MD Signature Date/Time: 09/02/2019/5:36:33 PM    Final    VAS US CAROTID  Result Date: 09/03/2019 Carotid Arterial Duplex Study Indications:       CVA. Risk Factors:      Hypertension. Other Factors:     Acute infarct of the lateral right thalamus. Comparison  Study:  none Performing Technologist: Jeb Levering RDMS, RVT  Examination Guidelines: A complete evaluation includes B-mode imaging, spectral Doppler, color Doppler, and power Doppler as needed of all accessible portions of each vessel. Bilateral testing is considered an integral part of a complete examination. Limited examinations for reoccurring indications may be performed as noted.  Right Carotid Findings: +----------+--------+--------+--------+------------------+--------+           PSV cm/sEDV cm/sStenosisPlaque DescriptionComments +----------+--------+--------+--------+------------------+--------+ CCA Prox  49      13                                         +----------+--------+--------+--------+------------------+--------+ CCA Distal45      12                                         +----------+--------+--------+--------+------------------+--------+ ICA Prox  36      11                                         +----------+--------+--------+--------+------------------+--------+ ICA Distal92      25                                         +----------+--------+--------+--------+------------------+--------+ ECA       54      10                                         +----------+--------+--------+--------+------------------+--------+ +----------+--------+-------+----------------+-------------------+           PSV cm/sEDV cmsDescribe  Arm Pressure (mmHG) +----------+--------+-------+----------------+-------------------+ [id number]             Multiphasic, WNL                    +----------+--------+-------+----------------+-------------------+ +---------+--------+--+--------+--+---------+ VertebralPSV cm/s34EDV cm/s11Antegrade +---------+--------+--+--------+--+---------+  Left Carotid Findings: +----------+--------+--------+--------+------------------+--------+           PSV cm/sEDV cm/sStenosisPlaque DescriptionComments  +----------+--------+--------+--------+------------------+--------+ CCA Prox  65      18                                         +----------+--------+--------+--------+------------------+--------+ CCA Distal41      10                                         +----------+--------+--------+--------+------------------+--------+ ICA Prox  39      14                                         +----------+--------+--------+--------+------------------+--------+ ICA Distal63      23                                         +----------+--------+--------+--------+------------------+--------+ ECA       54      5                                          +----------+--------+--------+--------+------------------+--------+ +----------+--------+--------+----------------+-------------------+           PSV cm/sEDV cm/sDescribe        Arm Pressure (mmHG) +----------+--------+--------+----------------+-------------------+ [id number]              Multiphasic, WNL                    +----------+--------+--------+----------------+-------------------+ +---------+--------+--+--------+--+---------+ VertebralPSV cm/s42EDV cm/s15Antegrade +---------+--------+--+--------+--+---------+   Summary: Right Carotid: The extracranial vessels were near-normal with only minimal wall                thickening or plaque. Left Carotid: The extracranial vessels were near-normal with only minimal wall               thickening or plaque.  *See table(s) above for measurements and observations.  Electronically signed by Lemar Livings MD on 09/03/2019 at 4:31:33 PM.    Final    CT Maxillofacial Wo Contrast  Result Date: 09/02/2019 CLINICAL DATA:  Facial trauma, hematoma above the LEFT eye EXAM: CT HEAD WITHOUT CONTRAST CT MAXILLOFACIAL WITHOUT CONTRAST TECHNIQUE: Multidetector CT imaging of the head and maxillofacial structures were performed using the standard protocol without intravenous contrast. Multiplanar  CT image reconstructions of the maxillofacial structures were also generated. COMPARISON:  MRI brain of 12/03/2018 FINDINGS: CT HEAD FINDINGS Brain: No evidence of acute infarction, hemorrhage, hydrocephalus, extra-axial collection or mass lesion/mass effect. Signs of atrophy, chronic microvascular ischemic change in deep and periventricular white matter and of prior infarct. Vascular: No hyperdense vessel or unexpected calcification. Skull: Normal. Negative for fracture or focal lesion. Other: LEFT supraorbital/frontal hematoma along the lateral margin of the LEFT orbit,  see below. CT MAXILLOFACIAL FINDINGS Osseous: No fracture or mandibular dislocation. No destructive process. Orbits: Periorbital stranding on the LEFT mainly along the lateral and superior margin of the orbit. No retro-orbital fat stranding. Sinuses: Clear. Soft tissues: Periorbital hematoma on the LEFT. IMPRESSION: 1. No acute intracranial pathology. Atrophy, signs of prior infarct and evidence of chronic microvascular ischemic change similar to the prior study. 2. No acute facial bone fracture. 3. Periorbital hematoma on the LEFT mainly along the lateral and superior margin of the orbit. No retro-orbital fat stranding. Electronically Signed   By: Donzetta Kohut M.D.   On: 09/02/2019 12:00    Assessment/Plan:   CVA (cerebral vascular accident) (HCC) 09/02/19 MRA small right thalamic infarct, underwent neurology eval, will continue  ASA, Plavix x 3 wks, then Plavix daily. Mild left sided weakness with muscle strength 5/5. 09/17/19 continue therapy in [facility]  MVP (mitral valve prolapse) Blood pressure is controlled, continue Lisinopril, Metoprolol.   Pericardial effusion resovled  Osteoporosis No recent fxs, continue Alendronate.   Constipation Continue diet, Colace.   Depression with anxiety Her mood is stable, continue Sertraline.   Pure hypercholesterolemia LDL 73 09/03/19, continue Atorvastatin  GERD (gastroesophageal  reflux disease) Stable, continue Pantoprazole.   Facial contusion, sequela Periorbital left lateral eye/eyebrow, healing.    Patient is being discharged with the following home health services:    Patient is being discharged with the following durable medical equipment:    Patient has been advised to f/u with their PCP in 1-2 weeks to bring them up to date on their rehab stay.  Social services at facility was responsible for arranging this appointment.  Pt was provided with a 30 day supply of prescriptions for medications and refills must be obtained from their PCP.  For controlled substances, a more limited supply may be provided adequate until PCP appointment only.  Future labs/tests needed:  none

## 2019-09-16 NOTE — Assessment & Plan Note (Signed)
Her mood is stable, continue Sertraline.  

## 2019-09-16 NOTE — Assessment & Plan Note (Signed)
No recent fxs, continue Alendronate.

## 2019-09-16 NOTE — Assessment & Plan Note (Signed)
resovled 

## 2019-09-17 ENCOUNTER — Encounter: Payer: Self-pay | Admitting: Nurse Practitioner

## 2019-09-17 DIAGNOSIS — I639 Cerebral infarction, unspecified: Secondary | ICD-10-CM | POA: Diagnosis not present

## 2019-09-17 DIAGNOSIS — M6281 Muscle weakness (generalized): Secondary | ICD-10-CM | POA: Diagnosis not present

## 2019-09-17 DIAGNOSIS — R2681 Unsteadiness on feet: Secondary | ICD-10-CM | POA: Diagnosis not present

## 2019-09-17 DIAGNOSIS — Z9181 History of falling: Secondary | ICD-10-CM | POA: Diagnosis not present

## 2019-09-21 DIAGNOSIS — M6281 Muscle weakness (generalized): Secondary | ICD-10-CM | POA: Diagnosis not present

## 2019-09-21 DIAGNOSIS — I639 Cerebral infarction, unspecified: Secondary | ICD-10-CM | POA: Diagnosis not present

## 2019-09-21 DIAGNOSIS — Z9181 History of falling: Secondary | ICD-10-CM | POA: Diagnosis not present

## 2019-09-21 DIAGNOSIS — R2681 Unsteadiness on feet: Secondary | ICD-10-CM | POA: Diagnosis not present

## 2019-09-22 DIAGNOSIS — I639 Cerebral infarction, unspecified: Secondary | ICD-10-CM | POA: Diagnosis not present

## 2019-09-22 DIAGNOSIS — R2681 Unsteadiness on feet: Secondary | ICD-10-CM | POA: Diagnosis not present

## 2019-09-22 DIAGNOSIS — Z9181 History of falling: Secondary | ICD-10-CM | POA: Diagnosis not present

## 2019-09-22 DIAGNOSIS — M6281 Muscle weakness (generalized): Secondary | ICD-10-CM | POA: Diagnosis not present

## 2019-09-23 ENCOUNTER — Ambulatory Visit: Payer: Medicare PPO | Admitting: Diagnostic Neuroimaging

## 2019-09-23 DIAGNOSIS — Z9181 History of falling: Secondary | ICD-10-CM | POA: Diagnosis not present

## 2019-09-23 DIAGNOSIS — I639 Cerebral infarction, unspecified: Secondary | ICD-10-CM | POA: Diagnosis not present

## 2019-09-23 DIAGNOSIS — M6281 Muscle weakness (generalized): Secondary | ICD-10-CM | POA: Diagnosis not present

## 2019-09-23 DIAGNOSIS — R2681 Unsteadiness on feet: Secondary | ICD-10-CM | POA: Diagnosis not present

## 2019-09-24 DIAGNOSIS — Z9181 History of falling: Secondary | ICD-10-CM | POA: Diagnosis not present

## 2019-09-24 DIAGNOSIS — I639 Cerebral infarction, unspecified: Secondary | ICD-10-CM | POA: Diagnosis not present

## 2019-09-24 DIAGNOSIS — R2681 Unsteadiness on feet: Secondary | ICD-10-CM | POA: Diagnosis not present

## 2019-09-24 DIAGNOSIS — M6281 Muscle weakness (generalized): Secondary | ICD-10-CM | POA: Diagnosis not present

## 2019-09-25 DIAGNOSIS — I639 Cerebral infarction, unspecified: Secondary | ICD-10-CM | POA: Diagnosis not present

## 2019-09-25 DIAGNOSIS — M6281 Muscle weakness (generalized): Secondary | ICD-10-CM | POA: Diagnosis not present

## 2019-09-25 DIAGNOSIS — R2681 Unsteadiness on feet: Secondary | ICD-10-CM | POA: Diagnosis not present

## 2019-09-25 DIAGNOSIS — Z9181 History of falling: Secondary | ICD-10-CM | POA: Diagnosis not present

## 2019-09-27 DIAGNOSIS — R2681 Unsteadiness on feet: Secondary | ICD-10-CM | POA: Diagnosis not present

## 2019-09-27 DIAGNOSIS — I639 Cerebral infarction, unspecified: Secondary | ICD-10-CM | POA: Diagnosis not present

## 2019-09-27 DIAGNOSIS — M6281 Muscle weakness (generalized): Secondary | ICD-10-CM | POA: Diagnosis not present

## 2019-09-27 DIAGNOSIS — Z9181 History of falling: Secondary | ICD-10-CM | POA: Diagnosis not present

## 2019-09-28 DIAGNOSIS — R2681 Unsteadiness on feet: Secondary | ICD-10-CM | POA: Diagnosis not present

## 2019-09-28 DIAGNOSIS — Z9181 History of falling: Secondary | ICD-10-CM | POA: Diagnosis not present

## 2019-09-28 DIAGNOSIS — I639 Cerebral infarction, unspecified: Secondary | ICD-10-CM | POA: Diagnosis not present

## 2019-09-28 DIAGNOSIS — M6281 Muscle weakness (generalized): Secondary | ICD-10-CM | POA: Diagnosis not present

## 2019-09-29 DIAGNOSIS — Z9181 History of falling: Secondary | ICD-10-CM | POA: Diagnosis not present

## 2019-09-29 DIAGNOSIS — M6281 Muscle weakness (generalized): Secondary | ICD-10-CM | POA: Diagnosis not present

## 2019-09-29 DIAGNOSIS — R2681 Unsteadiness on feet: Secondary | ICD-10-CM | POA: Diagnosis not present

## 2019-09-29 DIAGNOSIS — I639 Cerebral infarction, unspecified: Secondary | ICD-10-CM | POA: Diagnosis not present

## 2019-09-30 DIAGNOSIS — Z9181 History of falling: Secondary | ICD-10-CM | POA: Diagnosis not present

## 2019-09-30 DIAGNOSIS — I639 Cerebral infarction, unspecified: Secondary | ICD-10-CM | POA: Diagnosis not present

## 2019-09-30 DIAGNOSIS — R2681 Unsteadiness on feet: Secondary | ICD-10-CM | POA: Diagnosis not present

## 2019-09-30 DIAGNOSIS — M6281 Muscle weakness (generalized): Secondary | ICD-10-CM | POA: Diagnosis not present

## 2019-10-01 DIAGNOSIS — M6281 Muscle weakness (generalized): Secondary | ICD-10-CM | POA: Diagnosis not present

## 2019-10-01 DIAGNOSIS — I639 Cerebral infarction, unspecified: Secondary | ICD-10-CM | POA: Diagnosis not present

## 2019-10-01 DIAGNOSIS — Z9181 History of falling: Secondary | ICD-10-CM | POA: Diagnosis not present

## 2019-10-01 DIAGNOSIS — R2681 Unsteadiness on feet: Secondary | ICD-10-CM | POA: Diagnosis not present

## 2019-10-02 DIAGNOSIS — I639 Cerebral infarction, unspecified: Secondary | ICD-10-CM | POA: Diagnosis not present

## 2019-10-02 DIAGNOSIS — M6281 Muscle weakness (generalized): Secondary | ICD-10-CM | POA: Diagnosis not present

## 2019-10-02 DIAGNOSIS — R2681 Unsteadiness on feet: Secondary | ICD-10-CM | POA: Diagnosis not present

## 2019-10-02 DIAGNOSIS — Z9181 History of falling: Secondary | ICD-10-CM | POA: Diagnosis not present

## 2019-10-06 ENCOUNTER — Encounter: Payer: Self-pay | Admitting: Diagnostic Neuroimaging

## 2019-10-06 ENCOUNTER — Other Ambulatory Visit: Payer: Self-pay

## 2019-10-06 ENCOUNTER — Ambulatory Visit: Payer: Medicare PPO | Admitting: Diagnostic Neuroimaging

## 2019-10-06 VITALS — BP 138/75 | HR 47 | Ht 62.0 in | Wt 134.8 lb

## 2019-10-06 DIAGNOSIS — I639 Cerebral infarction, unspecified: Secondary | ICD-10-CM

## 2019-10-06 DIAGNOSIS — I6381 Other cerebral infarction due to occlusion or stenosis of small artery: Secondary | ICD-10-CM

## 2019-10-06 NOTE — Patient Instructions (Signed)
RIGHT THALAMIC STROKE (small vessel thrombosis) - continue aspirin 81mg  daily + plavix 75mg  daily x 3 months post stroke, then plavix 75mg  alone (starting 12/04/19) - continue atorvastatin 20mg  - continue BP control - follow up with PCP

## 2019-10-06 NOTE — Progress Notes (Signed)
[facility]  PATIENT: Adriana [name] DOB: 03-24-1934  REFERRING CLINICIAN: Mahlon Gammon, MD HISTORY FROM: patient  REASON FOR VISIT: new consult    HISTORICAL  CHIEF COMPLAINT:  Chief Complaint  Patient presents with  . Stroke    rm 7, hospital FU for stroke on 09/02/19, went to SNF fo ra while, now at assisted living but scheduled to go back to my apt tomorrow"    HISTORY OF PRESENT ILLNESS:   UPDATE (10/06/19, VRP): Since last visit, doing well until June 2021 stroke (right thalamic). Now at SNF, then ALF, and now going back to apartment tomorrow (at [facility]). Symptoms are improving.   PRIOR HPI (12/18/18): 85 year old female here for evaluation of stroke.  2 to 3 weeks ago patient had onset of blurred vision and loss of vision on the right side, mainly affecting her right eye.  Patient went to eye doctor for evaluation was noted to have right homonymous hemianopsia, right inferior quadrant.  Patient referred to the emergency room for evaluation.  MRI of the brain confirmed a subacute left occipital ischemic infarct.  Also noted to have left posterior cerebral artery P3 segment stenosis.  Patient was started on aspirin 81 mg daily and referred to neurology clinic.  Since that time symptoms are stable.  No numbness or weakness on the right side of her body.  She has noted some emotional lability and balance issues since this time.  She has had some left-sided headaches.  Patient has high blood pressure is on medical therapy.   REVIEW OF SYSTEMS: Full 14 system review of systems performed and negative with exception of: As per HPI.  ALLERGIES: Allergies  Allergen Reactions  . Contrast Media [Iodinated Diagnostic Agents] Hives    Hives during IVP at urology office '02, requires 13 hr prep///a.calhoun  Patient came thru ER and had a 1 hour pre-medication and did fine  . Oxycodone Other (See Comments)    Decreased appetite.  Sandrea Hammond [Ibandronic Acid]      Nausea, vomiting, weakness  . Codeine Diarrhea and Nausea And Vomiting  . Darvocet [Propoxyphene N-Acetaminophen] Diarrhea and Nausea And Vomiting  . Erythromycin Nausea And Vomiting    HOME MEDICATIONS: Outpatient Medications Prior to Visit  Medication Sig Dispense Refill  . alendronate (FOSAMAX) 70 MG tablet TAKE 1 TAB ONCE A WEEK, AT LEAST 30 MIN BEFORE 1ST FOOD.DO NOT LIE DOWN FOR 30 MIN AFTER TAKING. (Patient taking differently: Take 70 mg by mouth once a week. ) 12 tablet 1  . aspirin EC 81 MG tablet Take 81 mg by mouth daily. Swallow whole.    Marland Kitchen atorvastatin (LIPITOR) 20 MG tablet Take 1 tablet (20 mg total) by mouth in the morning.    . Calcium Carb-Cholecalciferol (CALCIUM 600+D3 PO) Take 600 mg by mouth daily.     . cloNIDine (CATAPRES) 0.1 MG tablet Take 0.1 mg by mouth 2 (two) times daily.    . clopidogrel (PLAVIX) 75 MG tablet Take 1 tablet (75 mg total) by mouth daily.    Marland Kitchen docusate sodium (STOOL SOFTENER) 100 MG capsule Take 100 mg by mouth at bedtime.    Marland Kitchen lisinopril (ZESTRIL) 5 MG tablet TAKE 1 TABLET ONCE DAILY. (Patient taking differently: Take 5 mg by mouth in the morning. ) 90 tablet 0  . metoprolol tartrate (LOPRESSOR) 25 MG tablet TAKE 1 TABLET BY MOUTH TWICE DAILY. (Patient taking differently: Take 25 mg by mouth 2 (two) times daily. ) 180 tablet 1  .  NON FORMULARY daily as needed. Hemp lemon salve  Rub on back for pain     . pantoprazole (PROTONIX) 40 MG tablet Take 1 tablet (40 mg total) by mouth daily. 30 tablet 0  . sertraline (ZOLOFT) 50 MG tablet Take 50 mg by mouth daily.    . sodium fluoride (PREVIDENT) 1.1 % GEL dental gel Place 1 application onto teeth at bedtime.     No facility-administered medications prior to visit.    PAST MEDICAL HISTORY: Past Medical History:  Diagnosis Date  . Anginal pain (HCC) 08/24/2010   Echo-EF =>55%,LV normal  . Arthritis   . Cataract 12/2006   Both eyes  Epes MD  . Chest pain, unspecified 09/04/2007    Lexiscan-- EF 72%; LV normal  . Constipation    Takes flax seed and honey .  Smooth Move tea.  . Depression   . Diverticulitis   . Head injury, closed    with fall  . Hypertension   . Mitral valve prolapse   . Osteopenia   . Stroke [facility]) 09/02/2019    PAST SURGICAL HISTORY: Past Surgical History:  Procedure Laterality Date  . ANTERIOR CERVICAL DECOMP/DISCECTOMY FUSION N/A 10/23/2012   Procedure: Cervical Five to Cervical Six, Cervical Six to Cervical Seven anterior cervical decompression with fusion plating and bonegraft;  Surgeon: Hewitt Shorts, MD;  Location: [facility];  Service: Neurosurgery;  Laterality: N/A;  ANTERIOR CERVICAL DECOMPRESSION/DISCECTOMY FUSION 2 LEVELS  . BREAST CYST ASPIRATION  1990   benign  . CARDIAC CATHETERIZATION  08/01/2010   normal coronaries  . COLONOSCOPY  approx 2011  . EYE SURGERY Bilateral 2011  . intestinal blockage surgery  Feb. 2012   "kink in small intestine" per pt    FAMILY HISTORY: Family History  Problem Relation Age of Onset  . Hypertension Mother   . Transient ischemic attack Mother   . Parkinson's disease Mother   . Dementia Mother   . CVA Mother   . Diabetes Father   . Coronary artery disease Father   . Heart failure Father   . Cancer Father        prostate  . Depression Father   . Congestive Heart Failure Father   . Prostate cancer Brother   . Diabetes Brother   . Prostate cancer Paternal Grandmother     SOCIAL HISTORY: Social History   Socioeconomic History  . Marital status: Widowed    Spouse name: Joe  . Number of children: 2  . Years of education: college  . Highest education level: Bachelor's degree (e.g., BA, AB, BS)  Occupational History    Comment: retired Runner, broadcasting/film/video  Tobacco Use  . Smoking status: Never Smoker  . Smokeless tobacco: Never Used  Vaping Use  . Vaping Use: Never used  Substance and Sexual Activity  . Alcohol use: Yes    Alcohol/week: 4.0 standard drinks    Types: 2 Glasses of  wine, 2 Cans of beer per week    Comment: occasionally  . Drug use: No  . Sexual activity: Yes  Other Topics Concern  . Not on file  Social History Narrative   Diet:  Normal   Do you drink/eat things with caffeine?   Yes   Marital status:  Widow                            What year were you married? 1986 and 1960   Do you live in a  house, apartment, assisted living, condo, trailer, etc)?  [facility]   Is it one or more stories? 1+   How many persons live in your home?  1   Do you have any pets in your home?  No   Current or past profession:  Runner, broadcasting/film/videoTeacher, Environmental health practitionerAdministrative assistant at [employer]   Do you exercise?   Yes                                                 Type & how often: Stretching,  Strengthening   Do you have a living will?  Yes   Do you have a DNR Form?   Do you have a POA/HPOA forms?  HPOA   Social Determinants of Health   Financial Resource Strain:   . Difficulty of Paying Living Expenses:   Food Insecurity:   . Worried About Programme researcher, broadcasting/film/videounning Out of Food in the Last Year:   . Baristaan Out of Food in the Last Year:   Transportation Needs:   . Freight forwarderLack of Transportation (Medical):   Marland Kitchen. Lack of Transportation (Non-Medical):   Physical Activity:   . Days of Exercise per Week:   . Minutes of Exercise per Session:   Stress:   . Feeling of Stress :   Social Connections:   . Frequency of Communication with Friends and Family:   . Frequency of Social Gatherings with Friends and Family:   . Attends Religious Services:   . Active Member of Clubs or Organizations:   . Attends BankerClub or Organization Meetings:   Marland Kitchen. Marital Status:   Intimate Partner Violence:   . Fear of Current or Ex-Partner:   . Emotionally Abused:   Marland Kitchen. Physically Abused:   . Sexually Abused:      PHYSICAL EXAM  GENERAL EXAM/CONSTITUTIONAL: Vitals:  Vitals:   10/06/19 0920  BP: 138/75  Pulse: (!) 47  Weight: 134 lb 12.8 oz (61.1 kg)  Height: 5\' 2"  (1.575 m)   Body mass index  is 24.66 kg/m. Wt Readings from Last 3 Encounters:  10/06/19 134 lb 12.8 oz (61.1 kg)  09/16/19 130 lb (59 kg)  09/08/19 134 lb 14.4 oz (61.2 kg)    Patient is in no distress; well developed, nourished and groomed; neck is supple  CARDIOVASCULAR:  Examination of carotid arteries is normal; no carotid bruits  Regular rate and rhythm, no murmurs  Examination of peripheral vascular system by observation and palpation is normal  EYES:  Ophthalmoscopic exam of optic discs and posterior segments is normal; no papilledema or hemorrhages No exam data present  MUSCULOSKELETAL:  Gait, strength, tone, movements noted in Neurologic exam below  NEUROLOGIC: MENTAL STATUS:  MMSE - Mini Mental State Exam 09/03/2017  Orientation to time 5  Orientation to Place 5  Registration 3  Attention/ Calculation 5  Recall 2  Language- name 2 objects 2  Language- repeat 1  Language- follow 3 step command 3  Language- read & follow direction 1  Write a sentence 1  Copy design 1  Total score 29    awake, alert, oriented to person, place and time  recent and remote memory intact  normal attention and concentration  language fluent, comprehension intact, naming intact  fund of knowledge appropriate  CRANIAL NERVE:   2nd - no papilledema on fundoscopic exam  2nd, 3rd, 4th,  6th - pupils equal and reactive to light, visual fields full to confrontation, extraocular muscles intact, no nystagmus; EXCEPT DECR RIGHT VISION FIELD FROM RIGHT EYE  5th - facial sensation symmetric  7th - facial strength symmetric  8th - hearing intact  9th - palate elevates symmetrically, uvula midline  11th - shoulder shrug symmetric  12th - tongue protrusion midline  MOTOR:   normal bulk and tone, full strength in the BUE, BLE; EXCEPT LEFT LEG WEAKNESS (4/5)  SENSORY:   normal and symmetric to light touch, temperature, vibration  COORDINATION:   finger-nose-finger, fine finger movements  normal  REFLEXES:   deep tendon reflexes TRACE and symmetric  GAIT/STATION:   narrow based gait; SCOLIOSIS AND KYPHOSIS; USES WALKER     DIAGNOSTIC DATA (LABS, IMAGING, TESTING) - I reviewed patient records, labs, notes, testing and imaging myself where available.  Lab Results  Component Value Date   WBC 8.3 09/04/2019   HGB 13.9 09/04/2019   HCT 41.9 09/04/2019   MCV 93.9 09/04/2019   PLT 243 09/04/2019      Component Value Date/Time   NA 139 09/04/2019 0437   NA 137 05/10/2017 0000   K 3.5 09/04/2019 0437   CL 105 09/04/2019 0437   CL 101 05/10/2017 0000   CO2 26 09/04/2019 0437   CO2 31 05/10/2017 0000   GLUCOSE 105 (H) 09/04/2019 0437   BUN 14 09/04/2019 0437   BUN 16 05/10/2017 0000   CREATININE 0.83 09/04/2019 0437   CREATININE 0.72 06/30/2019 0715   CALCIUM 9.4 09/04/2019 0437   CALCIUM 10.6 05/10/2017 0000   PROT 6.9 09/02/2019 1130   ALBUMIN 4.0 09/02/2019 1130   AST 30 09/02/2019 1130   ALT 15 09/02/2019 1130   ALKPHOS 52 09/02/2019 1130   BILITOT 1.0 09/02/2019 1130   GFRNONAA >60 09/04/2019 0437   GFRNONAA 52 (L) 05/12/2019 0745   GFRAA >60 09/04/2019 0437   GFRAA 61 05/12/2019 0745   Lab Results  Component Value Date   CHOL 137 09/03/2019   HDL 50 09/03/2019   LDLCALC 73 09/03/2019   TRIG 71 09/03/2019   CHOLHDL 2.7 09/03/2019   Lab Results  Component Value Date   HGBA1C 5.3 09/03/2019   No results found for: VITAMINB12 Lab Results  Component Value Date   TSH 4.05 06/30/2019   12/03/18 MRI brain / MRA head [I reviewed images myself and agree with interpretation. Mild atrophy and moderate chronic small vessel ischemic disease. -VRP]  1. Small subacute left occipital cortex infarct associated with severe narrowing at the left P3/P4 junction. 2. Generalized chronic small vessel ischemia that is similar to 2014.  09/02/19 MRI brain - Small acute infarct of the lateral right thalamus. - Moderate to advanced chronic microvascular ischemic  changes. Chronic left occipital infarct.  09/03/19 MRA head  1. Single short-segment severe distal right P2 stenosis, which could contribute to the previously identified right thalamic infarct. 2. Otherwise normal intracranial MRA. No other large vessel occlusion, hemodynamically significant stenosis, or other vascular Abnormality.  09/03/19 carotid u/s Right Carotid: The extracranial vessels were near-normal with only minimal  wall thickening or plaque.  Left Carotid: The extracranial vessels were near-normal with only minimal  wall thickening or plaque.   09/02/19 TTE 1. Left ventricular ejection fraction, by estimation, is 55 to 60%. The  left ventricle has normal function. The left ventricle has no regional  wall motion abnormalities. Left ventricular diastolic parameters are  consistent with Grade I diastolic  dysfunction (impaired relaxation).  2. Right ventricular systolic function is normal. The right ventricular  size is normal. There is normal pulmonary artery systolic pressure.  3. No evidence of mitral valve prolapse. The mitral valve is normal in  structure. Trivial mitral valve regurgitation. No evidence of mitral  stenosis.  4. The aortic valve is tricuspid. Aortic valve regurgitation is not  visualized. No aortic stenosis is present.  5. The inferior vena cava is normal in size with greater than 50%  respiratory variability, suggesting right atrial pressure of 3 mmHg.    ASSESSMENT AND PLAN  85 y.o. year old female here with left occipital ischemic infarction, possibly embolic. Then right thalamic small vessel stroke.   Dx:  1. Right thalamic stroke (HCC)     PLAN:  RIGHT THALAMIC STROKE (small vessel thrombosis) - continue aspirin 81mg  daily + plavix 75mg  daily x 3 months post stroke, then plavix 75mg  alone (starting 12/04/19) - continue atorvastatin 20mg  - continue BP control - follow up with PCP  Return for return to PCP.    ,  MD 10/06/2019, 10:07 AM Certified in Neurology, Neurophysiology and Neuroimaging  [facility] [address], [address] [place], 12/06/2019 [facility] [phone]

## 2019-10-08 DIAGNOSIS — R278 Other lack of coordination: Secondary | ICD-10-CM | POA: Diagnosis not present

## 2019-10-08 DIAGNOSIS — Z9181 History of falling: Secondary | ICD-10-CM | POA: Diagnosis not present

## 2019-10-08 DIAGNOSIS — M6281 Muscle weakness (generalized): Secondary | ICD-10-CM | POA: Diagnosis not present

## 2019-10-08 DIAGNOSIS — R4181 Age-related cognitive decline: Secondary | ICD-10-CM | POA: Diagnosis not present

## 2019-10-08 DIAGNOSIS — R2681 Unsteadiness on feet: Secondary | ICD-10-CM | POA: Diagnosis not present

## 2019-10-08 DIAGNOSIS — R41841 Cognitive communication deficit: Secondary | ICD-10-CM | POA: Diagnosis not present

## 2019-10-08 DIAGNOSIS — N3946 Mixed incontinence: Secondary | ICD-10-CM | POA: Diagnosis not present

## 2019-10-08 DIAGNOSIS — R1313 Dysphagia, pharyngeal phase: Secondary | ICD-10-CM | POA: Diagnosis not present

## 2019-10-08 DIAGNOSIS — J302 Other seasonal allergic rhinitis: Secondary | ICD-10-CM | POA: Diagnosis not present

## 2019-10-09 ENCOUNTER — Other Ambulatory Visit: Payer: Self-pay

## 2019-10-09 ENCOUNTER — Encounter: Payer: Self-pay | Admitting: Internal Medicine

## 2019-10-09 ENCOUNTER — Non-Acute Institutional Stay: Payer: Medicare PPO | Admitting: Internal Medicine

## 2019-10-09 VITALS — BP 156/90 | HR 56 | Temp 97.7°F | Ht 62.0 in | Wt 135.4 lb

## 2019-10-09 DIAGNOSIS — F418 Other specified anxiety disorders: Secondary | ICD-10-CM | POA: Diagnosis not present

## 2019-10-09 DIAGNOSIS — I10 Essential (primary) hypertension: Secondary | ICD-10-CM

## 2019-10-09 DIAGNOSIS — I63331 Cerebral infarction due to thrombosis of right posterior cerebral artery: Secondary | ICD-10-CM | POA: Diagnosis not present

## 2019-10-09 DIAGNOSIS — E785 Hyperlipidemia, unspecified: Secondary | ICD-10-CM

## 2019-10-09 DIAGNOSIS — M810 Age-related osteoporosis without current pathological fracture: Secondary | ICD-10-CM | POA: Diagnosis not present

## 2019-10-09 MED ORDER — LISINOPRIL 10 MG PO TABS
10.0000 mg | ORAL_TABLET | Freq: Every day | ORAL | 1 refills | Status: DC
Start: 2019-10-09 — End: 2019-11-05

## 2019-10-09 NOTE — Progress Notes (Signed)
Location: [facility] of Service:  Clinic (12)  Provider:   Code Status:  Goals of Care:  Advanced Directives 09/16/2019  Does Patient Have a Medical Advance Directive? Yes  Type of Advance Directive Living will  Does patient want to make changes to medical advance directive? -  Copy of Healthcare Power of Attorney in Chart? -  Pre-existing out of facility DNR order (yellow form or pink MOST form) -     Chief Complaint  Patient presents with  . Medical Management of Chronic Issues    Patient returns to the clinic for follow up.     HPI: Patient is a 85 y.o. female seen today for an acute visit for Follow up   Was admitted in the hospital from 6/30 through 7/3 with acute CVA and left periorbital hematoma. 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 A detailed work-up including heart monitor was negative at that time. She also history has a history of diverticulitis, depression, asymptomatic moderate pericardial effusion.  Patient is doing well.  Working with therapy.  Continues to have some gait issues.  But not had any falls recently. She was also complaining of sleeping and napping more than usual.  And feeling tired Her appetite seems to be good.  Denies any abdominal pain no diarrhea anymore. She was little concerned about her blood pressure Also c/o Some right Knee pain with Therapy. She is Also c/o Some Cough Dry   Past Medical History:  Diagnosis Date  . Anginal pain (HCC) 08/24/2010   Echo-EF =>55%,LV normal  . Arthritis   . Cataract 12/2006   Both eyes  Epes MD  . Chest pain, unspecified 09/04/2007   Lexiscan-- EF 72%; LV normal  . Constipation    Takes flax seed and honey .  Smooth Move tea.  . Depression   . Diverticulitis   . Head injury, closed    with fall  . Hypertension   . Mitral valve prolapse   . Osteopenia   . Stroke [facility]) 09/02/2019     Past Surgical History:  Procedure Laterality Date  . ANTERIOR CERVICAL DECOMP/DISCECTOMY FUSION N/A 10/23/2012   Procedure: Cervical Five to Cervical Six, Cervical Six to Cervical Seven anterior cervical decompression with fusion plating and bonegraft;  Surgeon: Hewitt Shorts, MD;  Location: [facility] NEURO ORS;  Service: Neurosurgery;  Laterality: N/A;  ANTERIOR CERVICAL DECOMPRESSION/DISCECTOMY FUSION 2 LEVELS  . BREAST CYST ASPIRATION  1990   benign  . CARDIAC CATHETERIZATION  08/01/2010   normal coronaries  . COLONOSCOPY  approx 2011  . EYE SURGERY Bilateral 2011  . intestinal blockage surgery  Feb. 2012   "kink in small intestine" per pt    Allergies  Allergen Reactions  . Contrast Media [Iodinated Diagnostic Agents] Hives    Hives during IVP at urology office '02, requires 13 hr prep///a.[email or web address]  Patient came thru ER and had a 1 hour pre-medication and did fine  . Oxycodone Other (See Comments)    Decreased appetite.  Adriana [name] [Ibandronic Acid]     Nausea, vomiting, weakness  . Codeine Diarrhea and Nausea And Vomiting  . Darvocet [Propoxyphene N-Acetaminophen] Diarrhea and Nausea And Vomiting  . Erythromycin Nausea And Vomiting    Outpatient Encounter Medications as of 10/09/2019  Medication Sig  . alendronate (FOSAMAX) 70 MG tablet TAKE 1 TAB ONCE A WEEK, AT LEAST 30 MIN  BEFORE 1ST FOOD.DO NOT LIE DOWN FOR 30 MIN AFTER TAKING. (Patient taking differently: Take 70 mg by mouth once a week. )  . aspirin EC 81 MG tablet Take 81 mg by mouth daily. Swallow whole.  Marland Kitchen. atorvastatin (LIPITOR) 20 MG tablet Take 1 tablet (20 mg total) by mouth in the morning.  . Calcium Carb-Cholecalciferol (CALCIUM 600+D3 PO) Take 600 mg by mouth daily.   . clopidogrel (PLAVIX) 75 MG tablet Take 1 tablet (75 mg total) by mouth daily.  Marland Kitchen. docusate sodium (STOOL SOFTENER) 100 MG capsule Take 100 mg by mouth at bedtime.  Marland Kitchen. lisinopril (ZESTRIL) 5 MG tablet TAKE 1 TABLET ONCE DAILY. (Patient taking  differently: Take 5 mg by mouth in the morning. )  . metoprolol tartrate (LOPRESSOR) 25 MG tablet TAKE 1 TABLET BY MOUTH TWICE DAILY. (Patient taking differently: Take 25 mg by mouth 2 (two) times daily. )  . pantoprazole (PROTONIX) 40 MG tablet Take 1 tablet (40 mg total) by mouth daily.  . sertraline (ZOLOFT) 50 MG tablet Take 50 mg by mouth daily.  . [DISCONTINUED] cloNIDine (CATAPRES) 0.1 MG tablet Take 0.1 mg by mouth 2 (two) times daily.  . [DISCONTINUED] NON FORMULARY daily as needed. Hemp lemon salve 500mg  Rub on back for pain   . [DISCONTINUED] sodium fluoride (PREVIDENT) 1.1 % GEL dental gel Place 1 application onto teeth at bedtime.   No facility-administered encounter medications on file as of 10/09/2019.    Review of Systems:  Review of Systems  Constitutional: Positive for activity change.  HENT: Negative.   Respiratory: Positive for cough.   Cardiovascular: Negative.   Gastrointestinal: Negative.   Genitourinary: Negative.   Musculoskeletal: Positive for gait problem.  Skin: Negative.   Neurological: Positive for weakness.  Psychiatric/Behavioral: Negative.     Health Maintenance  Topic Date Due  . TETANUS/TDAP  07/28/2019  . INFLUENZA VACCINE  10/04/2019  . DEXA SCAN  Completed  . COVID-19 Vaccine  Completed  . PNA vac Low Risk Adult  Completed    Physical Exam: Vitals:   10/09/19 1141  BP: (!) 156/90  Pulse: (!) 56  Temp: 97.7 F (36.5 C)  SpO2: 97%  Weight: 135 lb 6.4 oz (61.4 kg)  Height: 5\' 2"  (1.575 m)   Body mass index is 24.76 kg/m. Physical Exam Vitals reviewed.  Constitutional:      Appearance: Normal appearance.  HENT:     Head: Normocephalic.     Nose: Nose normal.     Mouth/Throat:     Mouth: Mucous membranes are moist.     Pharynx: Oropharynx is clear.  Eyes:     Pupils: Pupils are equal, round, and reactive to light.  Cardiovascular:     Rate and Rhythm: Normal rate and regular rhythm.     Pulses: Normal pulses.  Pulmonary:      Effort: Pulmonary effort is normal. No respiratory distress.     Breath sounds: Normal breath sounds. No wheezing or rales.  Abdominal:     General: Abdomen is flat. Bowel sounds are normal.     Palpations: Abdomen is soft.  Musculoskeletal:        General: No swelling.     Cervical back: Neck supple.     Comments: Right knee No Swelling or Redness No Tenderness  Skin:    General: Skin is warm.  Neurological:     General: No focal deficit present.     Mental Status: She is alert and oriented to person, place, and  time.     Comments: Slightly unstable gait . Needs walker  Psychiatric:        Mood and Affect: Mood normal.        Thought Content: Thought content normal.     Labs reviewed: Basic Metabolic Panel: Recent Labs    06/30/19 0715 06/30/19 0715 09/02/19 1130 09/03/19 0331 09/04/19 0437  NA 141   < > 140 138 139  K 3.7   < > 4.0 3.4* 3.5  CL 106   < > 105 105 105  CO2 29   < > 25 24 26   GLUCOSE 94   < > 102* 95 105*  BUN 11   < > 14 16 14   CREATININE 0.72  --  0.75 1.01* 0.83  CALCIUM 9.9   < > 10.0 9.5 9.4  TSH 4.05  --   --   --   --    < > = values in this interval not displayed.   Liver Function Tests: Recent Labs    12/02/18 1809 12/30/18 1020 05/12/19 0745 06/30/19 0715 09/02/19 1130  AST 31   < > 42*  42* 20 30  ALT 16   < > 23  23 12 15   ALKPHOS 57  --   --   --  52  BILITOT 0.9   < > 0.7  0.7 0.4 1.0  PROT 7.8   < > 6.9  6.9 6.6 6.9  ALBUMIN 4.5  --   --   --  4.0   < > = values in this interval not displayed.   Recent Labs    05/12/19 0745 06/30/19 0715  LIPASE 81* 25   No results for input(s): AMMONIA in the last 8760 hours. CBC: Recent Labs    12/02/18 1809 12/02/18 1829 09/02/19 1130 09/03/19 0331 09/04/19 0437  WBC 7.5   < > 8.1 6.6 8.3  NEUTROABS 5.0  --  6.0 4.1  --   HGB 14.4   < > 13.5 12.8 13.9  HCT 45.2   < > 41.6 39.3 41.9  MCV 98.3   < > 94.5 93.8 93.9  PLT 273   < > 236 220 243   < > = values in this  interval not displayed.   Lipid Panel: Recent Labs    05/12/19 0745 06/30/19 0715 09/03/19 0331  CHOL 84 129 137  HDL 41* 46* 50  LDLCALC 29 69 73  TRIG 67 65 71  CHOLHDL 2.0 2.8 2.7   Lab Results  Component Value Date   HGBA1C 5.3 09/03/2019    Procedures since last visit: No results found.  Assessment/Plan Cerebrovascular accident (CVA) due to thrombosis of right posterior cerebral artery [facility]) Per Neurology Dual Therapy for 3 months And then Plavix Patient understood Goal for BP control and LDL less then 70   Cough  D/W her about it been Side effect of Lisinopril Does not want to change right now Will let me know next visit  Essential hypertension Will increase her Lisinopril to 10 mg Will monitor by  Facility Nurse Repeat BMP  Right Knee pain Tylenol PRN Most likely srthritis  Depression with anxiety Continue Zoloft Can be cause of Sleepiness But patient wants to continue Will change it to Night  Hyperlipidemia, unspecified hyperlipidemia type Lipitor Increased in hospital  Goal for better control of Lipids  Osteoporosis without current pathological fracture, unspecified osteoporosis type Continue Fosamax Last DeXA in 07/2017 Tscore -2.2 Will Need Repeat DEXA soon GERD On  Protonix per Dr Loreta Ave  Labs/tests ordered:  * No order type specified * Next appt:  11/05/2019

## 2019-10-09 NOTE — Patient Instructions (Signed)
Change Zoloft to Night Change in Dose of Lisinopril Check BP Q weekly and bring it next visit Let us know how cough is doing next visit Try Voltaren Gel for knees Tylenol for pain before therapy

## 2019-10-13 DIAGNOSIS — N3946 Mixed incontinence: Secondary | ICD-10-CM | POA: Diagnosis not present

## 2019-10-13 DIAGNOSIS — R278 Other lack of coordination: Secondary | ICD-10-CM | POA: Diagnosis not present

## 2019-10-13 DIAGNOSIS — M6281 Muscle weakness (generalized): Secondary | ICD-10-CM | POA: Diagnosis not present

## 2019-10-13 DIAGNOSIS — R4181 Age-related cognitive decline: Secondary | ICD-10-CM | POA: Diagnosis not present

## 2019-10-13 DIAGNOSIS — R2681 Unsteadiness on feet: Secondary | ICD-10-CM | POA: Diagnosis not present

## 2019-10-13 DIAGNOSIS — Z9181 History of falling: Secondary | ICD-10-CM | POA: Diagnosis not present

## 2019-10-13 DIAGNOSIS — J302 Other seasonal allergic rhinitis: Secondary | ICD-10-CM | POA: Diagnosis not present

## 2019-10-13 DIAGNOSIS — R41841 Cognitive communication deficit: Secondary | ICD-10-CM | POA: Diagnosis not present

## 2019-10-13 DIAGNOSIS — R1313 Dysphagia, pharyngeal phase: Secondary | ICD-10-CM | POA: Diagnosis not present

## 2019-10-15 DIAGNOSIS — N3946 Mixed incontinence: Secondary | ICD-10-CM | POA: Diagnosis not present

## 2019-10-15 DIAGNOSIS — R4181 Age-related cognitive decline: Secondary | ICD-10-CM | POA: Diagnosis not present

## 2019-10-15 DIAGNOSIS — R41841 Cognitive communication deficit: Secondary | ICD-10-CM | POA: Diagnosis not present

## 2019-10-15 DIAGNOSIS — J302 Other seasonal allergic rhinitis: Secondary | ICD-10-CM | POA: Diagnosis not present

## 2019-10-15 DIAGNOSIS — R1313 Dysphagia, pharyngeal phase: Secondary | ICD-10-CM | POA: Diagnosis not present

## 2019-10-15 DIAGNOSIS — M6281 Muscle weakness (generalized): Secondary | ICD-10-CM | POA: Diagnosis not present

## 2019-10-15 DIAGNOSIS — R2681 Unsteadiness on feet: Secondary | ICD-10-CM | POA: Diagnosis not present

## 2019-10-15 DIAGNOSIS — R278 Other lack of coordination: Secondary | ICD-10-CM | POA: Diagnosis not present

## 2019-10-15 DIAGNOSIS — Z9181 History of falling: Secondary | ICD-10-CM | POA: Diagnosis not present

## 2019-10-20 DIAGNOSIS — Z9181 History of falling: Secondary | ICD-10-CM | POA: Diagnosis not present

## 2019-10-20 DIAGNOSIS — R4181 Age-related cognitive decline: Secondary | ICD-10-CM | POA: Diagnosis not present

## 2019-10-20 DIAGNOSIS — J302 Other seasonal allergic rhinitis: Secondary | ICD-10-CM | POA: Diagnosis not present

## 2019-10-20 DIAGNOSIS — M6281 Muscle weakness (generalized): Secondary | ICD-10-CM | POA: Diagnosis not present

## 2019-10-20 DIAGNOSIS — R2681 Unsteadiness on feet: Secondary | ICD-10-CM | POA: Diagnosis not present

## 2019-10-20 DIAGNOSIS — R278 Other lack of coordination: Secondary | ICD-10-CM | POA: Diagnosis not present

## 2019-10-20 DIAGNOSIS — R41841 Cognitive communication deficit: Secondary | ICD-10-CM | POA: Diagnosis not present

## 2019-10-20 DIAGNOSIS — N3946 Mixed incontinence: Secondary | ICD-10-CM | POA: Diagnosis not present

## 2019-10-20 DIAGNOSIS — R1313 Dysphagia, pharyngeal phase: Secondary | ICD-10-CM | POA: Diagnosis not present

## 2019-10-22 DIAGNOSIS — R1313 Dysphagia, pharyngeal phase: Secondary | ICD-10-CM | POA: Diagnosis not present

## 2019-10-22 DIAGNOSIS — M6281 Muscle weakness (generalized): Secondary | ICD-10-CM | POA: Diagnosis not present

## 2019-10-22 DIAGNOSIS — R41841 Cognitive communication deficit: Secondary | ICD-10-CM | POA: Diagnosis not present

## 2019-10-22 DIAGNOSIS — Z9181 History of falling: Secondary | ICD-10-CM | POA: Diagnosis not present

## 2019-10-22 DIAGNOSIS — N3946 Mixed incontinence: Secondary | ICD-10-CM | POA: Diagnosis not present

## 2019-10-22 DIAGNOSIS — J302 Other seasonal allergic rhinitis: Secondary | ICD-10-CM | POA: Diagnosis not present

## 2019-10-22 DIAGNOSIS — R278 Other lack of coordination: Secondary | ICD-10-CM | POA: Diagnosis not present

## 2019-10-22 DIAGNOSIS — R4181 Age-related cognitive decline: Secondary | ICD-10-CM | POA: Diagnosis not present

## 2019-10-22 DIAGNOSIS — R2681 Unsteadiness on feet: Secondary | ICD-10-CM | POA: Diagnosis not present

## 2019-10-27 DIAGNOSIS — R4181 Age-related cognitive decline: Secondary | ICD-10-CM | POA: Diagnosis not present

## 2019-10-27 DIAGNOSIS — R1313 Dysphagia, pharyngeal phase: Secondary | ICD-10-CM | POA: Diagnosis not present

## 2019-10-27 DIAGNOSIS — N3946 Mixed incontinence: Secondary | ICD-10-CM | POA: Diagnosis not present

## 2019-10-27 DIAGNOSIS — R278 Other lack of coordination: Secondary | ICD-10-CM | POA: Diagnosis not present

## 2019-10-27 DIAGNOSIS — Z9181 History of falling: Secondary | ICD-10-CM | POA: Diagnosis not present

## 2019-10-27 DIAGNOSIS — R2681 Unsteadiness on feet: Secondary | ICD-10-CM | POA: Diagnosis not present

## 2019-10-27 DIAGNOSIS — R41841 Cognitive communication deficit: Secondary | ICD-10-CM | POA: Diagnosis not present

## 2019-10-27 DIAGNOSIS — J302 Other seasonal allergic rhinitis: Secondary | ICD-10-CM | POA: Diagnosis not present

## 2019-10-27 DIAGNOSIS — M6281 Muscle weakness (generalized): Secondary | ICD-10-CM | POA: Diagnosis not present

## 2019-10-29 ENCOUNTER — Other Ambulatory Visit: Payer: Self-pay

## 2019-10-29 DIAGNOSIS — Z9181 History of falling: Secondary | ICD-10-CM | POA: Diagnosis not present

## 2019-10-29 DIAGNOSIS — M6281 Muscle weakness (generalized): Secondary | ICD-10-CM | POA: Diagnosis not present

## 2019-10-29 DIAGNOSIS — R1313 Dysphagia, pharyngeal phase: Secondary | ICD-10-CM | POA: Diagnosis not present

## 2019-10-29 DIAGNOSIS — I10 Essential (primary) hypertension: Secondary | ICD-10-CM

## 2019-10-29 DIAGNOSIS — R278 Other lack of coordination: Secondary | ICD-10-CM | POA: Diagnosis not present

## 2019-10-29 DIAGNOSIS — N3946 Mixed incontinence: Secondary | ICD-10-CM | POA: Diagnosis not present

## 2019-10-29 DIAGNOSIS — J302 Other seasonal allergic rhinitis: Secondary | ICD-10-CM | POA: Diagnosis not present

## 2019-10-29 DIAGNOSIS — I63331 Cerebral infarction due to thrombosis of right posterior cerebral artery: Secondary | ICD-10-CM

## 2019-10-29 DIAGNOSIS — R4181 Age-related cognitive decline: Secondary | ICD-10-CM | POA: Diagnosis not present

## 2019-10-29 DIAGNOSIS — R2681 Unsteadiness on feet: Secondary | ICD-10-CM | POA: Diagnosis not present

## 2019-10-29 DIAGNOSIS — R41841 Cognitive communication deficit: Secondary | ICD-10-CM | POA: Diagnosis not present

## 2019-10-29 LAB — COMPLETE METABOLIC PANEL WITH GFR
AG Ratio: 1.7 (calc) (ref 1.0–2.5)
ALT: 23 U/L (ref 6–29)
AST: 28 U/L (ref 10–35)
Albumin: 4.1 g/dL (ref 3.6–5.1)
Alkaline phosphatase (APISO): 62 U/L (ref 37–153)
BUN: 17 mg/dL (ref 7–25)
CO2: 28 mmol/L (ref 20–32)
Calcium: 9.4 mg/dL (ref 8.6–10.4)
Chloride: 103 mmol/L (ref 98–110)
Creat: 0.87 mg/dL (ref 0.60–0.88)
GFR, Est African American: 72 mL/min/{1.73_m2} (ref >=60)
GFR, Est Non African American: 62 mL/min/{1.73_m2} (ref >=60)
Globulin: 2.4 g/dL (calc) (ref 1.9–3.7)
Glucose, Bld: 99 mg/dL (ref 65–99)
Potassium: 4.2 mmol/L (ref 3.5–5.3)
Sodium: 139 mmol/L (ref 135–146)
Total Bilirubin: 0.5 mg/dL (ref 0.2–1.2)
Total Protein: 6.5 g/dL (ref 6.1–8.1)

## 2019-10-29 LAB — CBC WITH DIFFERENTIAL/PLATELET
Absolute Monocytes: 582 cells/uL (ref 200–950)
Basophils Absolute: 52 cells/uL (ref 0–200)
Basophils Relative: 1 %
Eosinophils Absolute: 380 cells/uL (ref 15–500)
Eosinophils Relative: 7.3 %
HCT: 39.8 % (ref 35.0–45.0)
Hemoglobin: 13.2 g/dL (ref 11.7–15.5)
Lymphs Abs: 1201 cells/uL (ref 850–3900)
MCH: 30.6 pg (ref 27.0–33.0)
MCHC: 33.2 g/dL (ref 32.0–36.0)
MCV: 92.3 fL (ref 80.0–100.0)
MPV: 10.8 fL (ref 7.5–12.5)
Monocytes Relative: 11.2 %
Neutro Abs: 2985 cells/uL (ref 1500–7800)
Neutrophils Relative %: 57.4 %
Platelets: 217 10*3/uL (ref 140–400)
RBC: 4.31 10*6/uL (ref 3.80–5.10)
RDW: 11.8 % (ref 11.0–15.0)
Total Lymphocyte: 23.1 %
WBC: 5.2 10*3/uL (ref 3.8–10.8)

## 2019-10-30 ENCOUNTER — Other Ambulatory Visit: Payer: Self-pay | Admitting: *Deleted

## 2019-10-30 MED ORDER — PANTOPRAZOLE SODIUM 40 MG PO TBEC
40.0000 mg | DELAYED_RELEASE_TABLET | Freq: Every day | ORAL | 1 refills | Status: DC
Start: 2019-10-30 — End: 2020-05-03

## 2019-10-30 MED ORDER — CLOPIDOGREL BISULFATE 75 MG PO TABS
75.0000 mg | ORAL_TABLET | Freq: Every day | ORAL | 1 refills | Status: DC
Start: 2019-10-30 — End: 2020-05-03

## 2019-10-30 NOTE — Telephone Encounter (Signed)
Patient called and stated that she was prescribed these medications in the hospital and was not sure that Dr. Chales Abrahams wanted her to remain on them. Stated that if so she needed a refill sent to pharmacy.   Pended Rx's and sent to Dr. Chales Abrahams for approval.

## 2019-11-02 ENCOUNTER — Ambulatory Visit: Payer: Medicare PPO | Admitting: Diagnostic Neuroimaging

## 2019-11-02 DIAGNOSIS — J302 Other seasonal allergic rhinitis: Secondary | ICD-10-CM | POA: Diagnosis not present

## 2019-11-02 DIAGNOSIS — R4181 Age-related cognitive decline: Secondary | ICD-10-CM | POA: Diagnosis not present

## 2019-11-02 DIAGNOSIS — N3946 Mixed incontinence: Secondary | ICD-10-CM | POA: Diagnosis not present

## 2019-11-02 DIAGNOSIS — R278 Other lack of coordination: Secondary | ICD-10-CM | POA: Diagnosis not present

## 2019-11-02 DIAGNOSIS — M6281 Muscle weakness (generalized): Secondary | ICD-10-CM | POA: Diagnosis not present

## 2019-11-02 DIAGNOSIS — R2681 Unsteadiness on feet: Secondary | ICD-10-CM | POA: Diagnosis not present

## 2019-11-02 DIAGNOSIS — R41841 Cognitive communication deficit: Secondary | ICD-10-CM | POA: Diagnosis not present

## 2019-11-02 DIAGNOSIS — R1313 Dysphagia, pharyngeal phase: Secondary | ICD-10-CM | POA: Diagnosis not present

## 2019-11-02 DIAGNOSIS — Z9181 History of falling: Secondary | ICD-10-CM | POA: Diagnosis not present

## 2019-11-03 ENCOUNTER — Telehealth: Payer: Self-pay | Admitting: *Deleted

## 2019-11-03 DIAGNOSIS — R278 Other lack of coordination: Secondary | ICD-10-CM | POA: Diagnosis not present

## 2019-11-03 DIAGNOSIS — R4181 Age-related cognitive decline: Secondary | ICD-10-CM | POA: Diagnosis not present

## 2019-11-03 DIAGNOSIS — J302 Other seasonal allergic rhinitis: Secondary | ICD-10-CM | POA: Diagnosis not present

## 2019-11-03 DIAGNOSIS — N3946 Mixed incontinence: Secondary | ICD-10-CM | POA: Diagnosis not present

## 2019-11-03 DIAGNOSIS — R1313 Dysphagia, pharyngeal phase: Secondary | ICD-10-CM | POA: Diagnosis not present

## 2019-11-03 DIAGNOSIS — M6281 Muscle weakness (generalized): Secondary | ICD-10-CM | POA: Diagnosis not present

## 2019-11-03 DIAGNOSIS — Z9181 History of falling: Secondary | ICD-10-CM | POA: Diagnosis not present

## 2019-11-03 DIAGNOSIS — R2681 Unsteadiness on feet: Secondary | ICD-10-CM | POA: Diagnosis not present

## 2019-11-03 DIAGNOSIS — R41841 Cognitive communication deficit: Secondary | ICD-10-CM | POA: Diagnosis not present

## 2019-11-03 NOTE — Telephone Encounter (Signed)
Patient called and stated that in [place] she was taking Atorvastatin 20mg  and once she was discharged Atorvastatin 10mg  was called into her pharmacy. Patient is confused on what to take.  Current medication list shows Atorvastatin 20mg , so patient stated that if it is the 20mg  she will just take 2 of the 10's, but she wanted to be sure first.  Please Advise.

## 2019-11-03 NOTE — Telephone Encounter (Signed)
Her Lipitor should be 20 mg. I don't know how she got 10 mg.

## 2019-11-03 NOTE — Telephone Encounter (Signed)
Patient notified

## 2019-11-05 ENCOUNTER — Encounter: Payer: Self-pay | Admitting: Nurse Practitioner

## 2019-11-05 ENCOUNTER — Non-Acute Institutional Stay: Payer: Medicare PPO | Admitting: Nurse Practitioner

## 2019-11-05 ENCOUNTER — Other Ambulatory Visit: Payer: Self-pay

## 2019-11-05 DIAGNOSIS — R4181 Age-related cognitive decline: Secondary | ICD-10-CM | POA: Diagnosis not present

## 2019-11-05 DIAGNOSIS — Z9181 History of falling: Secondary | ICD-10-CM | POA: Diagnosis not present

## 2019-11-05 DIAGNOSIS — I313 Pericardial effusion (noninflammatory): Secondary | ICD-10-CM | POA: Diagnosis not present

## 2019-11-05 DIAGNOSIS — I63331 Cerebral infarction due to thrombosis of right posterior cerebral artery: Secondary | ICD-10-CM

## 2019-11-05 DIAGNOSIS — K219 Gastro-esophageal reflux disease without esophagitis: Secondary | ICD-10-CM | POA: Diagnosis not present

## 2019-11-05 DIAGNOSIS — I10 Essential (primary) hypertension: Secondary | ICD-10-CM

## 2019-11-05 DIAGNOSIS — I3139 Other pericardial effusion (noninflammatory): Secondary | ICD-10-CM

## 2019-11-05 DIAGNOSIS — F418 Other specified anxiety disorders: Secondary | ICD-10-CM | POA: Diagnosis not present

## 2019-11-05 DIAGNOSIS — R05 Cough: Secondary | ICD-10-CM

## 2019-11-05 DIAGNOSIS — E7800 Pure hypercholesterolemia, unspecified: Secondary | ICD-10-CM | POA: Diagnosis not present

## 2019-11-05 DIAGNOSIS — M810 Age-related osteoporosis without current pathological fracture: Secondary | ICD-10-CM

## 2019-11-05 DIAGNOSIS — R278 Other lack of coordination: Secondary | ICD-10-CM | POA: Diagnosis not present

## 2019-11-05 DIAGNOSIS — J302 Other seasonal allergic rhinitis: Secondary | ICD-10-CM | POA: Diagnosis not present

## 2019-11-05 DIAGNOSIS — K5900 Constipation, unspecified: Secondary | ICD-10-CM | POA: Diagnosis not present

## 2019-11-05 DIAGNOSIS — R2681 Unsteadiness on feet: Secondary | ICD-10-CM | POA: Diagnosis not present

## 2019-11-05 DIAGNOSIS — M6281 Muscle weakness (generalized): Secondary | ICD-10-CM | POA: Diagnosis not present

## 2019-11-05 DIAGNOSIS — R41841 Cognitive communication deficit: Secondary | ICD-10-CM | POA: Diagnosis not present

## 2019-11-05 DIAGNOSIS — N3946 Mixed incontinence: Secondary | ICD-10-CM | POA: Diagnosis not present

## 2019-11-05 DIAGNOSIS — R1313 Dysphagia, pharyngeal phase: Secondary | ICD-10-CM | POA: Diagnosis not present

## 2019-11-05 DIAGNOSIS — R059 Cough, unspecified: Secondary | ICD-10-CM | POA: Insufficient documentation

## 2019-11-05 MED ORDER — LISINOPRIL 20 MG PO TABS: 20.0000 mg | ORAL_TABLET | Freq: Every day | ORAL | 3 refills | Status: DC

## 2019-11-05 NOTE — Patient Instructions (Addendum)
Blood pressure is elevated today,  Re-checked, 160/100 mmHg both arms,  Increase Lisinopril 20mg /10mg  continue Metoprolol. F/u clinic nurse at [facility] for blood pressure check, call if BP>140/90 mmHg persistently.

## 2019-11-05 NOTE — Progress Notes (Signed)
Location:   clinic [facility]   Place of Service:  Clinic (12) Provider: Chipper Oman NP  Code Status: DNR Goals of Care: [place] 11/05/2019  Does Patient Have a Medical Advance Directive? Yes  Type of Advance Directive Living will  Does patient want to make changes to medical advance directive? No - Patient declined  Copy of Healthcare Power of Attorney in Chart? -  Pre-existing out of facility DNR order (yellow form or pink MOST form) -     Chief Complaint  Patient presents with  . Medical Management of Chronic Issues    Patient returns to the clinic for her 4 week follow up.     HPI: Patient is a 85 y.o. female seen today for medical management of chronic diseases.                 OP, takes Fosamax, Ca, Vit D             Depression, takes Sertraline.              Pericardial effusion, resolved             Hyperlipidemia, takes Atorvastatin             HTN, takes Lisinopril, Metoprolol.              Constipation, takes Colace qd.             GERD, takes Pantoprazole.   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 Takes Plavix. Atorvastatin.    Past Medical History:  Diagnosis Date  . Anginal pain (HCC) 08/24/2010   Echo-EF =>55%,LV normal  . Arthritis   . Cataract 12/2006   Both eyes  Epes MD  . Chest pain, unspecified 09/04/2007   Lexiscan-- EF 72%; LV normal  . Constipation    Takes flax seed and honey .  Smooth Move tea.  . Depression   . Diverticulitis   . Head injury, closed    with fall  . Hypertension   . Mitral valve prolapse   . Osteopenia   . Stroke [facility]) 09/02/2019    Past Surgical History:  Procedure Laterality Date  . ANTERIOR CERVICAL DECOMP/DISCECTOMY FUSION N/A 10/23/2012   Procedure: Cervical Five to Cervical Six, Cervical Six to Cervical Seven anterior cervical decompression with fusion plating and bonegraft;  Surgeon: Hewitt Shorts, MD;  Location: [facility]  NEURO ORS;  Service: Neurosurgery;  Laterality: N/A;  ANTERIOR CERVICAL DECOMPRESSION/DISCECTOMY FUSION 2 LEVELS  . BREAST CYST ASPIRATION  1990   benign  . CARDIAC CATHETERIZATION  08/01/2010   normal coronaries  . COLONOSCOPY  approx 2011  . EYE SURGERY Bilateral 2011  . intestinal blockage surgery  Feb. 2012   "kink in small intestine" per pt    Allergies  Allergen Reactions  . Contrast Media [Iodinated Diagnostic Agents] Hives    Hives during IVP at urology office '02, requires 13 hr prep///a.[email or web address]  Patient came thru ER and had a 1 hour pre-medication and did fine  . Oxycodone Other (See Comments)    Decreased appetite.  Sandrea Hammond [Ibandronic Acid]     Nausea, vomiting, weakness  . Codeine Diarrhea and Nausea And Vomiting  . Darvocet [Propoxyphene N-Acetaminophen] Diarrhea and Nausea And Vomiting  . Erythromycin Nausea And Vomiting    Allergies as of 11/05/2019      Reactions   Contrast Media [iodinated Diagnostic Agents] Hives   Hives during  IVP at urology office '02, requires 13 hr prep///a.[email or web address] Patient came thru ER and had a 1 hour pre-medication and did fine   Oxycodone Other (See Comments)   Decreased appetite.   Boniva [ibandronic Acid]    Nausea, vomiting, weakness   Codeine Diarrhea, Nausea And Vomiting   Darvocet [propoxyphene N-acetaminophen] Diarrhea, Nausea And Vomiting   Erythromycin Nausea And Vomiting      Medication List       Accurate as of November 05, 2019 11:59 PM. If you have any questions, ask your nurse or doctor.        alendronate 70 MG tablet Commonly known as: FOSAMAX TAKE 1 TAB ONCE A WEEK, AT LEAST 30 MIN BEFORE 1ST FOOD.DO NOT LIE DOWN FOR 30 MIN AFTER TAKING. What changed: See the new instructions.   aspirin EC 81 MG tablet Take 81 mg by mouth daily. Swallow whole.   atorvastatin 20 MG tablet Commonly known as: LIPITOR Take 1 tablet (20 mg total) by mouth in the morning.   CALCIUM 600+D3 PO Take 600 mg by mouth daily.     clopidogrel 75 MG tablet Commonly known as: PLAVIX Take 1 tablet (75 mg total) by mouth daily.   lisinopril 20 MG tablet Commonly known as: ZESTRIL Take 1 tablet (20 mg total) by mouth daily. What changed:   medication strength  how much to take Changed by: Lejon Afzal X Kinsley Holderman, NP   metoprolol tartrate 25 MG tablet Commonly known as: LOPRESSOR TAKE 1 TABLET BY MOUTH TWICE DAILY.   pantoprazole 40 MG tablet Commonly known as: Protonix Take 1 tablet (40 mg total) by mouth daily.   sertraline 50 MG tablet Commonly known as: ZOLOFT Take 50 mg by mouth daily.   Stool Softener 100 MG capsule Generic drug: docusate sodium Take 100 mg by mouth at bedtime.       Review of Systems:  Review of Systems  Constitutional: Negative for appetite change, fever and unexpected weight change.       Sometime feels shaky/weak in general, not focal weakness or tremor.   HENT: Positive for hearing loss. Negative for congestion, trouble swallowing and voice change.   Eyes: Negative for visual disturbance.  Respiratory: Positive for cough. Negative for chest tightness, shortness of breath and wheezing.        Chronic occasional dry cough, left side throat tickles sometimes during eating, then cough. ST: no apparent swallowing problem.   Cardiovascular: Negative for chest pain, palpitations and leg swelling.  Gastrointestinal: Negative for abdominal pain and constipation.  Genitourinary: Negative for dysuria, frequency and urgency.  Musculoskeletal: Positive for arthralgias, back pain and gait problem.  Skin: Negative for color change.  Neurological: Positive for weakness. Negative for dizziness, facial asymmetry and speech difficulty.       Memory lapses occasionally. Left sided weakness.   Psychiatric/Behavioral: Negative for behavioral problems and sleep disturbance. The patient is not nervous/anxious.        Depressed, not interested in activities, doesn't want to live like this    Health  Maintenance  Topic Date Due  . TETANUS/TDAP  07/28/2019  . INFLUENZA VACCINE  10/04/2019  . DEXA SCAN  Completed  . COVID-19 Vaccine  Completed  . PNA vac Low Risk Adult  Completed    Physical Exam: Vitals:   11/05/19 1327  BP: (!) 162/100  Pulse: (!) 54  Temp: 97.8 F (36.6 C)  SpO2: 98%  Weight: 138 lb (62.6 kg)  Height: 5\' 2"  (1.575 m)   Body mass  index is 25.24 kg/m. Physical Exam Vitals and nursing note reviewed.  Constitutional:      Appearance: Normal appearance.  HENT:     Head: Normocephalic and atraumatic.     Mouth/Throat:     Mouth: Mucous membranes are moist.  Eyes:     Extraocular Movements: Extraocular movements intact.     Conjunctiva/sclera: Conjunctivae normal.     Pupils: Pupils are equal, round, and reactive to light.     Comments: Lateral right eye peripheral vision field loss since CVA 11/2018  Cardiovascular:     Rate and Rhythm: Normal rate and regular rhythm.     Heart sounds: No murmur heard.      Comments: HR 54 bpm Pulmonary:     Effort: Pulmonary effort is normal.     Breath sounds: No rales.  Abdominal:     General: Bowel sounds are normal.     Palpations: Abdomen is soft.     Tenderness: There is no abdominal tenderness.  Musculoskeletal:     Cervical back: Normal range of motion and neck supple.     Right lower leg: No edema.     Left lower leg: No edema.     Comments: Kyphoscoliosis.   Skin:    General: Skin is warm and dry.     Findings: Bruising present.     Comments: Bruise, contusion periorbital lateral left eye/eyebrow. Resolving ecchymoses left upper arm, left wrist/hand.   Neurological:     General: No focal deficit present.     Mental Status: She is alert and oriented to person, place, and time. Mental status is at baseline.     Motor: Weakness present.     Coordination: Coordination normal.     Gait: Gait abnormal.     Comments: Walker for ambulation. Left sided weakness with muscle strength 5/5.  Psychiatric:          Mood and Affect: Mood normal.        Behavior: Behavior normal.     Labs reviewed: Basic Metabolic Panel: Recent Labs    06/30/19 0715 09/02/19 1130 09/03/19 0331 09/04/19 0437 10/29/19 0700  NA 141   < > 138 139 139  K 3.7   < > 3.4* 3.5 4.2  CL 106   < > 105 105 103  CO2 29   < > 24 26 28   GLUCOSE 94   < > 95 105* 99  BUN 11   < > 16 14 17   CREATININE 0.72   < > 1.01* 0.83 0.87  CALCIUM 9.9   < > 9.5 9.4 9.4  TSH 4.05  --   --   --   --    < > = values in this interval not displayed.   Liver Function Tests: Recent Labs    12/02/18 1809 12/30/18 1020 06/30/19 0715 09/02/19 1130 10/29/19 0700  AST 31   < > 20 30 28   ALT 16   < > 12 15 23   ALKPHOS 57  --   --  52  --   BILITOT 0.9   < > 0.4 1.0 0.5  PROT 7.8   < > 6.6 6.9 6.5  ALBUMIN 4.5  --   --  4.0  --    < > = values in this interval not displayed.   Recent Labs    05/12/19 0745 06/30/19 0715  LIPASE 81* 25   No results for input(s): AMMONIA in the last 8760 hours. CBC: Recent Labs  09/02/19 1130 09/02/19 1130 09/03/19 0331 09/04/19 0437 10/29/19 0700  WBC 8.1   < > 6.6 8.3 5.2  NEUTROABS 6.0  --  4.1  --  2,985  HGB 13.5   < > 12.8 13.9 13.2  HCT 41.6   < > 39.3 41.9 39.8  MCV 94.5   < > 93.8 93.9 92.3  PLT 236   < > 220 243 217   < > = values in this interval not displayed.   Lipid Panel: Recent Labs    05/12/19 0745 06/30/19 0715 09/03/19 0331  CHOL 84 129 137  HDL 41* 46* 50  LDLCALC 29 69 73  TRIG 67 65 71  CHOLHDL 2.0 2.8 2.7   Lab Results  Component Value Date   HGBA1C 5.3 09/03/2019    Procedures since last visit: No results found.  Assessment/Plan  CVA (cerebral vascular accident) [facility]) CVA 2nd to thrombosis of right posterior cerebral artery(hospitalized 6/30-7/3), Hx of CVA left occipital cortex 9/20. Takes Plavix. Atorvastatin.    Hypertension Blood pressure is elevated,  re checked, 160/100 both arms,  Increase Lisinopril 20mg /10mg , continue Metoprolol.  F/u clinic nurse at [facility], call if BP>140/90 mmHg  Pericardial effusion Resolved.   GERD (gastroesophageal reflux disease) Stable, continue Pantoprazole.   Osteoporosis Stable, continue Fosamax, Ca, Vit D  Constipation Stable, continue Colace.   Depression with anxiety Her mood is stable, continue Sertraline.   Pure hypercholesterolemia LDL 73 09/03/19, continue Atorvastatin.   Cough Chronic occasional dry cough for about a year, left side throat tickles sometimes during eating, then cough, ? Result for CVA. . ST: no apparent swallowing problem. May be ENT in the future.     Labs/tests ordered:  None  Next appt:  2 weeks with Dr. 11/04/19

## 2019-11-05 NOTE — Assessment & Plan Note (Signed)
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 Takes Plavix. Atorvastatin.

## 2019-11-05 NOTE — Assessment & Plan Note (Signed)
LDL 73 09/03/19, continue Atorvastatin.

## 2019-11-05 NOTE — Assessment & Plan Note (Signed)
Stable, continue Colace.  

## 2019-11-05 NOTE — Assessment & Plan Note (Signed)
Resolved

## 2019-11-05 NOTE — Assessment & Plan Note (Signed)
Stable, continue Fosamax, Ca, Vit D

## 2019-11-05 NOTE — Assessment & Plan Note (Signed)
Stable, continue Pantoprazole.  

## 2019-11-05 NOTE — Assessment & Plan Note (Addendum)
Blood pressure is elevated,  re checked, 160/100 both arms,  Increase Lisinopril 20mg /10mg , continue Metoprolol. F/u clinic nurse at [facility], call if BP>140/90 mmHg

## 2019-11-05 NOTE — Assessment & Plan Note (Addendum)
Chronic occasional dry cough for about a year, left side throat tickles sometimes during eating, then cough, ? Result for CVA. . ST: no apparent swallowing problem. May be ENT in the future.

## 2019-11-05 NOTE — Assessment & Plan Note (Signed)
Her mood is stable, continue Sertraline.  

## 2019-11-06 ENCOUNTER — Encounter: Payer: Self-pay | Admitting: Nurse Practitioner

## 2019-11-06 DIAGNOSIS — M6281 Muscle weakness (generalized): Secondary | ICD-10-CM | POA: Diagnosis not present

## 2019-11-06 DIAGNOSIS — N3946 Mixed incontinence: Secondary | ICD-10-CM | POA: Diagnosis not present

## 2019-11-06 DIAGNOSIS — R1313 Dysphagia, pharyngeal phase: Secondary | ICD-10-CM | POA: Diagnosis not present

## 2019-11-06 DIAGNOSIS — Z9181 History of falling: Secondary | ICD-10-CM | POA: Diagnosis not present

## 2019-11-06 DIAGNOSIS — R41841 Cognitive communication deficit: Secondary | ICD-10-CM | POA: Diagnosis not present

## 2019-11-06 DIAGNOSIS — R4181 Age-related cognitive decline: Secondary | ICD-10-CM | POA: Diagnosis not present

## 2019-11-06 DIAGNOSIS — R2681 Unsteadiness on feet: Secondary | ICD-10-CM | POA: Diagnosis not present

## 2019-11-06 DIAGNOSIS — R278 Other lack of coordination: Secondary | ICD-10-CM | POA: Diagnosis not present

## 2019-11-06 DIAGNOSIS — J302 Other seasonal allergic rhinitis: Secondary | ICD-10-CM | POA: Diagnosis not present

## 2019-11-10 ENCOUNTER — Telehealth: Payer: Self-pay | Admitting: *Deleted

## 2019-11-10 NOTE — Telephone Encounter (Signed)
The patient needs to be seen ASAP to address her nausea, dizziness, and blood pressure issues.

## 2019-11-10 NOTE — Telephone Encounter (Signed)
Patient called and stated that she was seen on 11/05/19 and her Lisinopril was Increased. Stated that since she has been taking the increased dosage she has had side effects of Nausea and Dizziness. No other symptoms noted.  Please Advise.

## 2019-11-11 NOTE — Telephone Encounter (Signed)
Patient called back and stated that she is wanting her blood pressure checked.  Instructed her to call the clinic nurse there at [facility] to evaluate her and blood pressure. She agreed.   Patient has an appointment tomorrow with [facility]. Patient did not want to come into office.

## 2019-11-11 NOTE — Telephone Encounter (Signed)
Patient called and stated that she never received a call back yesterday.  Message was never routed back to me and was signed off.  Appointment scheduled with [facility]  to be seen. Requested to be seen at [facility]

## 2019-11-12 ENCOUNTER — Encounter: Payer: Self-pay | Admitting: Nurse Practitioner

## 2019-11-12 ENCOUNTER — Non-Acute Institutional Stay: Payer: Medicare PPO | Admitting: Nurse Practitioner

## 2019-11-12 ENCOUNTER — Other Ambulatory Visit: Payer: Self-pay

## 2019-11-12 VITALS — BP 128/80 | HR 70 | Temp 98.2°F | Ht 62.0 in | Wt 132.6 lb

## 2019-11-12 DIAGNOSIS — K2900 Acute gastritis without bleeding: Secondary | ICD-10-CM

## 2019-11-12 DIAGNOSIS — R05 Cough: Secondary | ICD-10-CM

## 2019-11-12 DIAGNOSIS — I63331 Cerebral infarction due to thrombosis of right posterior cerebral artery: Secondary | ICD-10-CM

## 2019-11-12 DIAGNOSIS — M810 Age-related osteoporosis without current pathological fracture: Secondary | ICD-10-CM

## 2019-11-12 DIAGNOSIS — I10 Essential (primary) hypertension: Secondary | ICD-10-CM | POA: Diagnosis not present

## 2019-11-12 DIAGNOSIS — I313 Pericardial effusion (noninflammatory): Secondary | ICD-10-CM

## 2019-11-12 DIAGNOSIS — K2970 Gastritis, unspecified, without bleeding: Secondary | ICD-10-CM | POA: Insufficient documentation

## 2019-11-12 DIAGNOSIS — E7800 Pure hypercholesterolemia, unspecified: Secondary | ICD-10-CM

## 2019-11-12 DIAGNOSIS — R531 Weakness: Secondary | ICD-10-CM

## 2019-11-12 DIAGNOSIS — F418 Other specified anxiety disorders: Secondary | ICD-10-CM | POA: Diagnosis not present

## 2019-11-12 DIAGNOSIS — K5901 Slow transit constipation: Secondary | ICD-10-CM

## 2019-11-12 DIAGNOSIS — K219 Gastro-esophageal reflux disease without esophagitis: Secondary | ICD-10-CM | POA: Diagnosis not present

## 2019-11-12 DIAGNOSIS — R059 Cough, unspecified: Secondary | ICD-10-CM

## 2019-11-12 DIAGNOSIS — I3139 Other pericardial effusion (noninflammatory): Secondary | ICD-10-CM

## 2019-11-12 NOTE — Assessment & Plan Note (Addendum)
Viral vs infectious vs food intolerance? Better, will update CBC/diff, CMP/eGFR, observe.  Negative COVID today

## 2019-11-12 NOTE — Assessment & Plan Note (Signed)
HTN, increased Lisinopril 20/10mg  qd 11/05/19 for elevated Bp 160/100, also takes  Metoprolol.

## 2019-11-12 NOTE — Assessment & Plan Note (Signed)
takes Fosamax, Ca, Vit D

## 2019-11-12 NOTE — Assessment & Plan Note (Signed)
Chronic occasional dry cough for about a year, left side throat tickles sometimes during eating, then cough, ? Resultant of CVA. . ST: no apparent swallowing problem. May be ENT in the future.

## 2019-11-12 NOTE — Assessment & Plan Note (Signed)
Hyperlipidemia, takes Atorvastatin, LDL 73 09/03/19  

## 2019-11-12 NOTE — Assessment & Plan Note (Signed)
Better today, will update CBC/diff, CMP/eGFR, may need COVID testing as well.

## 2019-11-12 NOTE — Assessment & Plan Note (Signed)
Resolved

## 2019-11-12 NOTE — Progress Notes (Signed)
Location:   Clinic [facility]   Place of Service:  Clinic (12) Provider: Marlana Latus NP  Code Status: DNR Goals of Care: [place] 11/05/2019  Does Patient Have a Medical Advance Directive? Yes  Type of Advance Directive Living will  Does patient want to make changes to medical advance directive? No - Patient declined  Copy of [place] in Chart? -  Pre-existing out of facility DNR order (yellow form or pink MOST form) -     Chief Complaint  Patient presents with  . Acute Visit    Patient returns to the clinic complaining of dizziness and nausea.     HPI: Patient is a 85 y.o. female seen today for an acute visit for fever, vomiting on Tuesday, then has been feeling exhausted, tired, weak in leg, decreased appetite, 3 loose stools today 11/12/19. The above symptoms started 5 days after when 11/05/19 Lisinopril was increased for elevated Bp 160/100, Bp 128/80, T 98.2 today.   OP, takes Fosamax, Ca, Vit D Depression, takes Sertraline.  Pericardial effusion, resolved Hyperlipidemia, takes Atorvastatin, LDL 73 09/03/19 HTN, increased Lisinopril 20/1m qd 11/05/19 for elevated Bp 160/100, also takes  Metoprolol.  Constipation, takes Colace qd.              GERD, takes Pantoprazole.              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 Takes Plavix. Atorvastatin.    Chronic occasional dry cough for about a year, left side throat tickles sometimes during eating, then cough, ? Resultant of CVA. . ST: no apparent swallowing problem. May be ENT in the future.  Past Medical History:  Diagnosis Date  . [place] ([facility] 08/[place]   Echo-EF =>55%,LV normal  . Arthritis   . Cataract 12/2006   Both eyes  Epes MD  . Chest pain, unspecified 09/04/2007   Lexiscan-- EF 72%; LV normal  . Constipation    Takes flax seed and  honey .  Smooth Move tea.  . Depression   . Diverticulitis   . Head injury, closed    with fall  . Hypertension   . Mitral valve prolapse   . Osteopenia   . Stroke ([facility] 09/02/2019    Past Surgical History:  Procedure Laterality Date  . ANTERIOR CERVICAL DECOMP/DISCECTOMY FUSION N/A 10/23/2012   Procedure: Cervical Five to Cervical Six, Cervical Six to Cervical Seven anterior cervical decompression with fusion plating and bonegraft;  Surgeon: RHosie Spangle MD;  [facility];  Service: Neurosurgery;  Laterality: N/A;  ANTERIOR CERVICAL DECOMPRESSION/DISCECTOMY FUSION 2 LEVELS  . BREAST CYST ASPIRATION  1990   benign  . CARDIAC CATHETERIZATION  08/01/2010   normal coronaries  . COLONOSCOPY  approx 2011  . EYE SURGERY Bilateral 2011  . intestinal blockage surgery  Feb. 2012   "kink in small intestine" per pt    Allergies  Allergen Reactions  . Contrast Media [Iodinated Diagnostic Agents] Hives    Hives during IVP at urology office '02, requires 13 hr prep///a.[email or web address]  Patient came thru ER and had a 1 hour pre-medication and did fine  . Oxycodone Other (See Comments)    Decreased appetite.  .Jaclyn Prime[Ibandronic Acid]     Nausea, vomiting, weakness  . Codeine Diarrhea and Nausea And Vomiting  . Darvocet [Propoxyphene N-Acetaminophen] Diarrhea and Nausea And Vomiting  . Erythromycin Nausea And Vomiting    Allergies  as of 11/12/2019      Reactions   Contrast Media [iodinated Diagnostic Agents] Hives   Hives during IVP at urology office '02, requires 13 hr prep///a.[email or web address] Patient came thru ER and had a 1 hour pre-medication and did fine   Oxycodone Other (See Comments)   Decreased appetite.   Boniva [ibandronic Acid]    Nausea, vomiting, weakness   Codeine Diarrhea, Nausea And Vomiting   Darvocet [propoxyphene N-acetaminophen] Diarrhea, Nausea And Vomiting   Erythromycin Nausea And Vomiting      Medication List       Accurate as of November 12, 2019  7:54  PM. If you have any questions, ask your nurse or doctor.        alendronate 70 MG tablet Commonly known as: FOSAMAX TAKE 1 TAB ONCE A WEEK, AT LEAST 30 MIN BEFORE 1ST FOOD.DO NOT LIE DOWN FOR 30 MIN AFTER TAKING. What changed: See the new instructions.   aspirin EC 81 MG tablet Take 81 mg by mouth daily. Swallow whole.   atorvastatin 20 MG tablet Commonly known as: LIPITOR Take 1 tablet (20 mg total) by mouth in the morning.   CALCIUM 600+D3 PO Take 600 mg by mouth daily.   clopidogrel 75 MG tablet Commonly known as: PLAVIX Take 1 tablet (75 mg total) by mouth daily.   lisinopril 20 MG tablet Commonly known as: ZESTRIL Take 1 tablet (20 mg total) by mouth daily.   metoprolol tartrate 25 MG tablet Commonly known as: LOPRESSOR TAKE 1 TABLET BY MOUTH TWICE DAILY.   pantoprazole 40 MG tablet Commonly known as: Protonix Take 1 tablet (40 mg total) by mouth daily.   sertraline 50 MG tablet Commonly known as: ZOLOFT Take 50 mg by mouth daily.   Stool Softener 100 MG capsule Generic drug: docusate sodium Take 100 mg by mouth at bedtime.       Review of Systems:  Review of Systems  Constitutional: Positive for activity change, appetite change and fever.       Sometime feels shaky/weak in general, not focal weakness or tremor.   HENT: Positive for hearing loss. Negative for congestion, trouble swallowing and voice change.   Eyes: Negative for visual disturbance.  Respiratory: Positive for cough. Negative for chest tightness, shortness of breath and wheezing.        Chronic occasional dry cough, left side throat tickles sometimes during eating, then cough. ST: no apparent swallowing problem.   Cardiovascular: Negative for chest pain, palpitations and leg swelling.  Gastrointestinal: Positive for diarrhea and nausea. Negative for abdominal pain, constipation and vomiting.       NV x1 Tuesday 11/10/19, then 3 loose stools today Thursday 11/12/19  Genitourinary: Negative for  dysuria, frequency and urgency.  Musculoskeletal: Positive for arthralgias, back pain and gait problem.  Skin: Negative for color change.  Neurological: Positive for weakness. Negative for dizziness, syncope, facial asymmetry, speech difficulty, light-headedness, numbness and headaches.       Memory lapses occasionally. Left sided weakness.   Psychiatric/Behavioral: Negative for behavioral problems and sleep disturbance. The patient is not nervous/anxious.        Depressed, not interested in activities, doesn't want to live like this    Health Maintenance  Topic Date Due  . TETANUS/TDAP  07/28/2019  . INFLUENZA VACCINE  10/04/2019  . DEXA SCAN  Completed  . COVID-19 Vaccine  Completed  . PNA vac Low Risk Adult  Completed    Physical Exam: Vitals:   11/12/19 1444  BP: 128/80  Pulse: 70  Temp: 98.2 F (36.8 C)  SpO2: 98%  Weight: 132 lb 9.6 oz (60.1 kg)  Height: '5\' 2"'  (1.575 m)   Body mass index is 24.25 kg/m. Physical Exam Vitals and nursing note reviewed.  Constitutional:      Appearance: Normal appearance.  HENT:     Head: Normocephalic and atraumatic.     Mouth/Throat:     Mouth: Mucous membranes are moist.  Eyes:     Extraocular Movements: Extraocular movements intact.     Conjunctiva/sclera: Conjunctivae normal.     Pupils: Pupils are equal, round, and reactive to light.     Comments: Lateral right eye peripheral vision field loss since CVA 11/2018  Cardiovascular:     Rate and Rhythm: Normal rate and regular rhythm.     Heart sounds: No murmur heard.      Comments: HR 54 bpm Pulmonary:     Effort: Pulmonary effort is normal.     Breath sounds: No rales.  Abdominal:     General: Bowel sounds are normal. There is no distension.     Palpations: Abdomen is soft.     Tenderness: There is no abdominal tenderness. There is no right CVA tenderness, left CVA tenderness, guarding or rebound.  Musculoskeletal:     Cervical back: Normal range of motion and neck  supple.     Right lower leg: No edema.     Left lower leg: No edema.     Comments: Kyphoscoliosis.   Skin:    General: Skin is warm and dry.     Findings: Bruising present.     Comments: Bruise, contusion periorbital lateral left eye/eyebrow. Resolving ecchymoses left upper arm, left wrist/hand.   Neurological:     General: No focal deficit present.     Mental Status: She is alert and oriented to person, place, and time. Mental status is at baseline.     Cranial Nerves: No cranial nerve deficit.     Motor: Weakness present.     Coordination: Coordination normal.     Gait: Gait abnormal.     Deep Tendon Reflexes: Reflexes normal.     Comments: Walker for ambulation. Left sided weakness with muscle strength 5/5.  Psychiatric:        Mood and Affect: Mood normal.        Behavior: Behavior normal.     Labs reviewed: Basic Metabolic Panel: Recent Labs    06/30/19 0715 09/02/19 1130 09/03/19 0331 09/04/19 0437 10/29/19 0700  NA 141   < > 138 139 139  K 3.7   < > 3.4* 3.5 4.2  CL 106   < > 105 105 103  CO2 29   < > '24 26 28  ' GLUCOSE 94   < > 95 105* 99  BUN 11   < > '16 14 17  ' CREATININE 0.72   < > 1.01* 0.83 0.87  CALCIUM 9.9   < > 9.5 9.4 9.4  TSH 4.05  --   --   --   --    < > = values in this interval not displayed.   Liver Function Tests: Recent Labs    12/02/18 1809 12/30/18 1020 06/30/19 0715 09/02/19 1130 10/29/19 0700  AST 31   < > '20 30 28  ' ALT 16   < > '12 15 23  ' ALKPHOS 57  --   --  52  --   BILITOT 0.9   < > 0.4 1.0 0.5  PROT 7.8   < >  6.6 6.9 6.5  ALBUMIN 4.5  --   --  4.0  --    < > = values in this interval not displayed.   Recent Labs    05/12/19 0745 06/30/19 0715  LIPASE 81* 25   No results for input(s): AMMONIA in the last 8760 hours. CBC: Recent Labs    09/02/19 1130 09/02/19 1130 09/03/19 0331 09/04/19 0437 10/29/19 0700  WBC 8.1   < > 6.6 8.3 5.2  NEUTROABS 6.0  --  4.1  --  2,985  HGB 13.5   < > 12.8 13.9 13.2  HCT 41.6   < >  39.3 41.9 39.8  MCV 94.5   < > 93.8 93.9 92.3  PLT 236   < > 220 243 217   < > = values in this interval not displayed.   Lipid Panel: Recent Labs    05/12/19 0745 06/30/19 0715 09/03/19 0331  CHOL 84 129 137  HDL 41* 46* 50  LDLCALC 29 69 73  TRIG 67 65 71  CHOLHDL 2.0 2.8 2.7   Lab Results  Component Value Date   HGBA1C 5.3 09/03/2019    Procedures since last visit: No results found.  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 Takes Plavix. Atorvastatin.    Cough Chronic occasional dry cough for about a year, left side throat tickles sometimes during eating, then cough, ? Resultant of CVA. . ST: no apparent swallowing problem. May be ENT in the future.    GERD (gastroesophageal reflux disease) Stable, continue Pantoprazole.   Hypertension HTN, increased Lisinopril 20/86m qd 11/05/19 for elevated Bp 160/100, also takes  Metoprolol.    Osteoporosis takes Fosamax, Ca, Vit D  Depression with anxiety Continue Sertraline.   Pericardial effusion Resolved.   Pure hypercholesterolemia Hyperlipidemia, takes Atorvastatin, LDL 73 09/03/19   Slow transit constipation Continue Colace.   Generalized weakness Better today, will update CBC/diff, CMP/eGFR, may need COVID testing as well.   Gastritis Viral vs infectious vs food intolerance? Better, will update CBC/diff, CMP/eGFR, observe.  Negative COVID today    Labs/tests ordered:  CBC/diff, CMP/eGFR  Next appt:  12/18/2019

## 2019-11-12 NOTE — Assessment & Plan Note (Signed)
Continue Sertraline

## 2019-11-12 NOTE — Assessment & Plan Note (Signed)
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 Takes Plavix. Atorvastatin.  

## 2019-11-12 NOTE — Assessment & Plan Note (Signed)
Stable, continue Pantoprazole.  

## 2019-11-12 NOTE — Assessment & Plan Note (Signed)
Continue Colace.

## 2019-11-16 DIAGNOSIS — R531 Weakness: Secondary | ICD-10-CM | POA: Diagnosis not present

## 2019-11-17 ENCOUNTER — Other Ambulatory Visit: Payer: Self-pay

## 2019-11-17 DIAGNOSIS — J302 Other seasonal allergic rhinitis: Secondary | ICD-10-CM | POA: Diagnosis not present

## 2019-11-17 DIAGNOSIS — M6281 Muscle weakness (generalized): Secondary | ICD-10-CM | POA: Diagnosis not present

## 2019-11-17 DIAGNOSIS — R41841 Cognitive communication deficit: Secondary | ICD-10-CM | POA: Diagnosis not present

## 2019-11-17 DIAGNOSIS — R1313 Dysphagia, pharyngeal phase: Secondary | ICD-10-CM | POA: Diagnosis not present

## 2019-11-17 DIAGNOSIS — Z9181 History of falling: Secondary | ICD-10-CM | POA: Diagnosis not present

## 2019-11-17 DIAGNOSIS — N3946 Mixed incontinence: Secondary | ICD-10-CM | POA: Diagnosis not present

## 2019-11-17 DIAGNOSIS — R4181 Age-related cognitive decline: Secondary | ICD-10-CM | POA: Diagnosis not present

## 2019-11-17 DIAGNOSIS — R278 Other lack of coordination: Secondary | ICD-10-CM | POA: Diagnosis not present

## 2019-11-17 DIAGNOSIS — R531 Weakness: Secondary | ICD-10-CM

## 2019-11-17 DIAGNOSIS — R2681 Unsteadiness on feet: Secondary | ICD-10-CM | POA: Diagnosis not present

## 2019-11-17 LAB — COMPLETE METABOLIC PANEL WITH GFR
AG Ratio: 1.6 (calc) (ref 1.0–2.5)
ALT: 21 U/L (ref 6–29)
AST: 28 U/L (ref 10–35)
Albumin: 3.9 g/dL (ref 3.6–5.1)
Alkaline phosphatase (APISO): 77 U/L (ref 37–153)
BUN/Creatinine Ratio: 18 (calc) (ref 6–22)
BUN: 16 mg/dL (ref 7–25)
CO2: 27 mmol/L (ref 20–32)
Calcium: 9 mg/dL (ref 8.6–10.4)
Chloride: 104 mmol/L (ref 98–110)
Creat: 0.9 mg/dL — ABNORMAL HIGH (ref 0.60–0.88)
GFR, Est African American: 69 mL/min/{1.73_m2} (ref >=60)
GFR, Est Non African American: 60 mL/min/{1.73_m2} (ref >=60)
Globulin: 2.4 g/dL (calc) (ref 1.9–3.7)
Glucose, Bld: 93 mg/dL (ref 65–99)
Potassium: 3.7 mmol/L (ref 3.5–5.3)
Sodium: 140 mmol/L (ref 135–146)
Total Bilirubin: 0.3 mg/dL (ref 0.2–1.2)
Total Protein: 6.3 g/dL (ref 6.1–8.1)

## 2019-11-17 LAB — CBC WITH DIFFERENTIAL/PLATELET
Absolute Monocytes: 485 cells/uL (ref 200–950)
Basophils Absolute: 51 cells/uL (ref 0–200)
Basophils Relative: 0.9 %
Eosinophils Absolute: 656 cells/uL — ABNORMAL HIGH (ref 15–500)
Eosinophils Relative: 11.5 %
HCT: 39.1 % (ref 35.0–45.0)
Hemoglobin: 12.9 g/dL (ref 11.7–15.5)
Lymphs Abs: 1328 cells/uL (ref 850–3900)
MCH: 30.3 pg (ref 27.0–33.0)
MCHC: 33 g/dL (ref 32.0–36.0)
MCV: 91.8 fL (ref 80.0–100.0)
MPV: 10.6 fL (ref 7.5–12.5)
Monocytes Relative: 8.5 %
Neutro Abs: 3181 cells/uL (ref 1500–7800)
Neutrophils Relative %: 55.8 %
Platelets: 257 10*3/uL (ref 140–400)
RBC: 4.26 10*6/uL (ref 3.80–5.10)
RDW: 11.9 % (ref 11.0–15.0)
Total Lymphocyte: 23.3 %
WBC: 5.7 10*3/uL (ref 3.8–10.8)

## 2019-11-19 DIAGNOSIS — M6281 Muscle weakness (generalized): Secondary | ICD-10-CM | POA: Diagnosis not present

## 2019-11-19 DIAGNOSIS — N3946 Mixed incontinence: Secondary | ICD-10-CM | POA: Diagnosis not present

## 2019-11-19 DIAGNOSIS — R4181 Age-related cognitive decline: Secondary | ICD-10-CM | POA: Diagnosis not present

## 2019-11-19 DIAGNOSIS — R41841 Cognitive communication deficit: Secondary | ICD-10-CM | POA: Diagnosis not present

## 2019-11-19 DIAGNOSIS — Z9181 History of falling: Secondary | ICD-10-CM | POA: Diagnosis not present

## 2019-11-19 DIAGNOSIS — R2681 Unsteadiness on feet: Secondary | ICD-10-CM | POA: Diagnosis not present

## 2019-11-19 DIAGNOSIS — J302 Other seasonal allergic rhinitis: Secondary | ICD-10-CM | POA: Diagnosis not present

## 2019-11-19 DIAGNOSIS — R278 Other lack of coordination: Secondary | ICD-10-CM | POA: Diagnosis not present

## 2019-11-19 DIAGNOSIS — R1313 Dysphagia, pharyngeal phase: Secondary | ICD-10-CM | POA: Diagnosis not present

## 2019-11-23 DIAGNOSIS — J302 Other seasonal allergic rhinitis: Secondary | ICD-10-CM | POA: Diagnosis not present

## 2019-11-23 DIAGNOSIS — R41841 Cognitive communication deficit: Secondary | ICD-10-CM | POA: Diagnosis not present

## 2019-11-23 DIAGNOSIS — R1313 Dysphagia, pharyngeal phase: Secondary | ICD-10-CM | POA: Diagnosis not present

## 2019-11-23 DIAGNOSIS — R2681 Unsteadiness on feet: Secondary | ICD-10-CM | POA: Diagnosis not present

## 2019-11-23 DIAGNOSIS — M6281 Muscle weakness (generalized): Secondary | ICD-10-CM | POA: Diagnosis not present

## 2019-11-23 DIAGNOSIS — R4181 Age-related cognitive decline: Secondary | ICD-10-CM | POA: Diagnosis not present

## 2019-11-23 DIAGNOSIS — Z9181 History of falling: Secondary | ICD-10-CM | POA: Diagnosis not present

## 2019-11-23 DIAGNOSIS — N3946 Mixed incontinence: Secondary | ICD-10-CM | POA: Diagnosis not present

## 2019-11-23 DIAGNOSIS — R278 Other lack of coordination: Secondary | ICD-10-CM | POA: Diagnosis not present

## 2019-11-26 DIAGNOSIS — R41841 Cognitive communication deficit: Secondary | ICD-10-CM | POA: Diagnosis not present

## 2019-11-26 DIAGNOSIS — M6281 Muscle weakness (generalized): Secondary | ICD-10-CM | POA: Diagnosis not present

## 2019-11-26 DIAGNOSIS — R4181 Age-related cognitive decline: Secondary | ICD-10-CM | POA: Diagnosis not present

## 2019-11-26 DIAGNOSIS — R278 Other lack of coordination: Secondary | ICD-10-CM | POA: Diagnosis not present

## 2019-11-26 DIAGNOSIS — R2681 Unsteadiness on feet: Secondary | ICD-10-CM | POA: Diagnosis not present

## 2019-11-26 DIAGNOSIS — N3946 Mixed incontinence: Secondary | ICD-10-CM | POA: Diagnosis not present

## 2019-11-26 DIAGNOSIS — R1313 Dysphagia, pharyngeal phase: Secondary | ICD-10-CM | POA: Diagnosis not present

## 2019-11-26 DIAGNOSIS — Z9181 History of falling: Secondary | ICD-10-CM | POA: Diagnosis not present

## 2019-11-26 DIAGNOSIS — J302 Other seasonal allergic rhinitis: Secondary | ICD-10-CM | POA: Diagnosis not present

## 2019-12-01 ENCOUNTER — Other Ambulatory Visit: Payer: Self-pay | Admitting: *Deleted

## 2019-12-01 DIAGNOSIS — R2681 Unsteadiness on feet: Secondary | ICD-10-CM | POA: Diagnosis not present

## 2019-12-01 DIAGNOSIS — R41841 Cognitive communication deficit: Secondary | ICD-10-CM | POA: Diagnosis not present

## 2019-12-01 DIAGNOSIS — R4181 Age-related cognitive decline: Secondary | ICD-10-CM | POA: Diagnosis not present

## 2019-12-01 DIAGNOSIS — R278 Other lack of coordination: Secondary | ICD-10-CM | POA: Diagnosis not present

## 2019-12-01 DIAGNOSIS — Z9181 History of falling: Secondary | ICD-10-CM | POA: Diagnosis not present

## 2019-12-01 DIAGNOSIS — M6281 Muscle weakness (generalized): Secondary | ICD-10-CM | POA: Diagnosis not present

## 2019-12-01 DIAGNOSIS — N3946 Mixed incontinence: Secondary | ICD-10-CM | POA: Diagnosis not present

## 2019-12-01 DIAGNOSIS — J302 Other seasonal allergic rhinitis: Secondary | ICD-10-CM | POA: Diagnosis not present

## 2019-12-01 DIAGNOSIS — R1313 Dysphagia, pharyngeal phase: Secondary | ICD-10-CM | POA: Diagnosis not present

## 2019-12-01 MED ORDER — ATORVASTATIN CALCIUM 20 MG PO TABS
20.0000 mg | ORAL_TABLET | Freq: Every morning | ORAL | 1 refills | Status: DC
Start: 2019-12-01 — End: 2020-05-31

## 2019-12-01 NOTE — Telephone Encounter (Signed)
Pharmacy requested refill

## 2019-12-03 DIAGNOSIS — Z9181 History of falling: Secondary | ICD-10-CM | POA: Diagnosis not present

## 2019-12-03 DIAGNOSIS — J302 Other seasonal allergic rhinitis: Secondary | ICD-10-CM | POA: Diagnosis not present

## 2019-12-03 DIAGNOSIS — R278 Other lack of coordination: Secondary | ICD-10-CM | POA: Diagnosis not present

## 2019-12-03 DIAGNOSIS — R4181 Age-related cognitive decline: Secondary | ICD-10-CM | POA: Diagnosis not present

## 2019-12-03 DIAGNOSIS — N3946 Mixed incontinence: Secondary | ICD-10-CM | POA: Diagnosis not present

## 2019-12-03 DIAGNOSIS — R2681 Unsteadiness on feet: Secondary | ICD-10-CM | POA: Diagnosis not present

## 2019-12-03 DIAGNOSIS — R1313 Dysphagia, pharyngeal phase: Secondary | ICD-10-CM | POA: Diagnosis not present

## 2019-12-03 DIAGNOSIS — M6281 Muscle weakness (generalized): Secondary | ICD-10-CM | POA: Diagnosis not present

## 2019-12-03 DIAGNOSIS — R41841 Cognitive communication deficit: Secondary | ICD-10-CM | POA: Diagnosis not present

## 2019-12-08 DIAGNOSIS — M4010 Other secondary kyphosis, site unspecified: Secondary | ICD-10-CM | POA: Diagnosis not present

## 2019-12-08 DIAGNOSIS — Z9181 History of falling: Secondary | ICD-10-CM | POA: Diagnosis not present

## 2019-12-08 DIAGNOSIS — M4100 Infantile idiopathic scoliosis, site unspecified: Secondary | ICD-10-CM | POA: Diagnosis not present

## 2019-12-08 DIAGNOSIS — E782 Mixed hyperlipidemia: Secondary | ICD-10-CM | POA: Diagnosis not present

## 2019-12-08 DIAGNOSIS — M159 Polyosteoarthritis, unspecified: Secondary | ICD-10-CM | POA: Diagnosis not present

## 2019-12-08 DIAGNOSIS — R278 Other lack of coordination: Secondary | ICD-10-CM | POA: Diagnosis not present

## 2019-12-08 DIAGNOSIS — R2681 Unsteadiness on feet: Secondary | ICD-10-CM | POA: Diagnosis not present

## 2019-12-08 DIAGNOSIS — M6281 Muscle weakness (generalized): Secondary | ICD-10-CM | POA: Diagnosis not present

## 2019-12-08 DIAGNOSIS — I341 Nonrheumatic mitral (valve) prolapse: Secondary | ICD-10-CM | POA: Diagnosis not present

## 2019-12-10 DIAGNOSIS — M159 Polyosteoarthritis, unspecified: Secondary | ICD-10-CM | POA: Diagnosis not present

## 2019-12-10 DIAGNOSIS — E782 Mixed hyperlipidemia: Secondary | ICD-10-CM | POA: Diagnosis not present

## 2019-12-10 DIAGNOSIS — Z9181 History of falling: Secondary | ICD-10-CM | POA: Diagnosis not present

## 2019-12-10 DIAGNOSIS — M6281 Muscle weakness (generalized): Secondary | ICD-10-CM | POA: Diagnosis not present

## 2019-12-10 DIAGNOSIS — M4100 Infantile idiopathic scoliosis, site unspecified: Secondary | ICD-10-CM | POA: Diagnosis not present

## 2019-12-10 DIAGNOSIS — R278 Other lack of coordination: Secondary | ICD-10-CM | POA: Diagnosis not present

## 2019-12-10 DIAGNOSIS — R2681 Unsteadiness on feet: Secondary | ICD-10-CM | POA: Diagnosis not present

## 2019-12-10 DIAGNOSIS — I341 Nonrheumatic mitral (valve) prolapse: Secondary | ICD-10-CM | POA: Diagnosis not present

## 2019-12-10 DIAGNOSIS — M4010 Other secondary kyphosis, site unspecified: Secondary | ICD-10-CM | POA: Diagnosis not present

## 2019-12-14 DIAGNOSIS — M159 Polyosteoarthritis, unspecified: Secondary | ICD-10-CM | POA: Diagnosis not present

## 2019-12-14 DIAGNOSIS — M6281 Muscle weakness (generalized): Secondary | ICD-10-CM | POA: Diagnosis not present

## 2019-12-14 DIAGNOSIS — M4010 Other secondary kyphosis, site unspecified: Secondary | ICD-10-CM | POA: Diagnosis not present

## 2019-12-14 DIAGNOSIS — M4100 Infantile idiopathic scoliosis, site unspecified: Secondary | ICD-10-CM | POA: Diagnosis not present

## 2019-12-14 DIAGNOSIS — E782 Mixed hyperlipidemia: Secondary | ICD-10-CM | POA: Diagnosis not present

## 2019-12-14 DIAGNOSIS — R278 Other lack of coordination: Secondary | ICD-10-CM | POA: Diagnosis not present

## 2019-12-14 DIAGNOSIS — Z9181 History of falling: Secondary | ICD-10-CM | POA: Diagnosis not present

## 2019-12-14 DIAGNOSIS — I341 Nonrheumatic mitral (valve) prolapse: Secondary | ICD-10-CM | POA: Diagnosis not present

## 2019-12-14 DIAGNOSIS — R2681 Unsteadiness on feet: Secondary | ICD-10-CM | POA: Diagnosis not present

## 2019-12-16 DIAGNOSIS — R2681 Unsteadiness on feet: Secondary | ICD-10-CM | POA: Diagnosis not present

## 2019-12-16 DIAGNOSIS — E782 Mixed hyperlipidemia: Secondary | ICD-10-CM | POA: Diagnosis not present

## 2019-12-16 DIAGNOSIS — M4100 Infantile idiopathic scoliosis, site unspecified: Secondary | ICD-10-CM | POA: Diagnosis not present

## 2019-12-16 DIAGNOSIS — I341 Nonrheumatic mitral (valve) prolapse: Secondary | ICD-10-CM | POA: Diagnosis not present

## 2019-12-16 DIAGNOSIS — Z9181 History of falling: Secondary | ICD-10-CM | POA: Diagnosis not present

## 2019-12-16 DIAGNOSIS — M6281 Muscle weakness (generalized): Secondary | ICD-10-CM | POA: Diagnosis not present

## 2019-12-16 DIAGNOSIS — M159 Polyosteoarthritis, unspecified: Secondary | ICD-10-CM | POA: Diagnosis not present

## 2019-12-16 DIAGNOSIS — M4010 Other secondary kyphosis, site unspecified: Secondary | ICD-10-CM | POA: Diagnosis not present

## 2019-12-16 DIAGNOSIS — R278 Other lack of coordination: Secondary | ICD-10-CM | POA: Diagnosis not present

## 2019-12-18 ENCOUNTER — Encounter: Payer: Self-pay | Admitting: Internal Medicine

## 2019-12-18 ENCOUNTER — Non-Acute Institutional Stay: Payer: Medicare PPO | Admitting: Internal Medicine

## 2019-12-18 ENCOUNTER — Other Ambulatory Visit: Payer: Self-pay

## 2019-12-18 VITALS — BP 140/88 | HR 50 | Temp 97.5°F | Ht 62.0 in | Wt 143.0 lb

## 2019-12-18 DIAGNOSIS — R059 Cough, unspecified: Secondary | ICD-10-CM | POA: Diagnosis not present

## 2019-12-18 DIAGNOSIS — E7800 Pure hypercholesterolemia, unspecified: Secondary | ICD-10-CM | POA: Diagnosis not present

## 2019-12-18 DIAGNOSIS — M810 Age-related osteoporosis without current pathological fracture: Secondary | ICD-10-CM | POA: Diagnosis not present

## 2019-12-18 DIAGNOSIS — I63331 Cerebral infarction due to thrombosis of right posterior cerebral artery: Secondary | ICD-10-CM | POA: Diagnosis not present

## 2019-12-18 DIAGNOSIS — K219 Gastro-esophageal reflux disease without esophagitis: Secondary | ICD-10-CM

## 2019-12-18 DIAGNOSIS — K5900 Constipation, unspecified: Secondary | ICD-10-CM

## 2019-12-18 DIAGNOSIS — F418 Other specified anxiety disorders: Secondary | ICD-10-CM

## 2019-12-18 DIAGNOSIS — I10 Essential (primary) hypertension: Secondary | ICD-10-CM

## 2019-12-18 MED ORDER — LOSARTAN POTASSIUM 50 MG PO TABS: 50.0000 mg | ORAL_TABLET | Freq: Every day | ORAL | 3 refills | Status: DC

## 2019-12-18 NOTE — Patient Instructions (Signed)
Lisinopril will be changed to Losartan.  Don't take aspirin, just take Plavix.

## 2019-12-18 NOTE — Progress Notes (Signed)
Location:  [facility] [employer] [facility] Service:  Clinic (12)  Provider:   Code Status:  Goals of Care:  Advanced Directives 11/05/2019  Does Patient Have a Medical Advance Directive? Yes  Type of Advance Directive Living will  Does patient want to make changes to medical advance directive? No - Patient declined  Copy of Healthcare Power of Attorney in Chart? -  Pre-existing out of facility DNR order (yellow form or pink MOST form) -     Chief Complaint  Patient presents with  . Medical Management of Chronic Issues    Patient returns to the clinic for follow up.  Marland Kitchen Health Maintenance    TDAP    HPI: Patient is a 85 y.o. female seen today for medical management of chronic diseases.    Was admitted in the hospital from 6/30 through 7/3 with acute CVA and left periorbital hematoma. Thought to be due to Small vessel Disease Patient Also has past medical history of posterior CVA in left occipital cortexin 9/20. A detailed work-up including heart monitor was negative at that time. She also history has a history of diverticulitis, depression, asymptomatic moderate pericardial effusion.  Continues to do well. No Falls. Walks with the walker. No Falls. Does have significant kyphosis. Does c/o Unstable gait / Dizziness due to her eyes. Just saw Ophthalmologist Also has cough Dry hacking which she think can be related to Lisinopril and wants to know if I can change it Patient also continues to have issues with Depression but does not want to change dose of Zoloft yet   Past Medical History:  Diagnosis Date  . Anginal pain ([facility]) 08/24/2010   Echo-EF =>55%,LV normal  . Arthritis   . Cataract 12/2006   Both eyes  Epes MD  . Chest pain, unspecified 09/04/2007   Lexiscan-- EF 72%; LV normal  . Constipation    Takes flax seed and honey .  Smooth Move tea.  . Depression   . Diverticulitis   . Head injury, closed    with fall  . Hypertension   . Mitral valve prolapse   .  Osteopenia   . Stroke [facility]) 09/02/2019    Past Surgical History:  Procedure Laterality Date  . ANTERIOR CERVICAL DECOMP/DISCECTOMY FUSION N/A 10/23/2012   Procedure: Cervical Five to Cervical Six, Cervical Six to Cervical Seven anterior cervical decompression with fusion plating and bonegraft;  Surgeon: Hewitt Shorts, MD;  Location: [facility];  Service: Neurosurgery;  Laterality: N/A;  ANTERIOR CERVICAL DECOMPRESSION/DISCECTOMY FUSION 2 LEVELS  . BREAST CYST ASPIRATION  1990   benign  . CARDIAC CATHETERIZATION  08/01/2010   normal coronaries  . COLONOSCOPY  approx 2011  . EYE SURGERY Bilateral 2011  . intestinal blockage surgery  Feb. 2012   "kink in small intestine" per pt    Allergies  Allergen Reactions  . Contrast Media [Iodinated Diagnostic Agents] Hives    Hives during IVP at urology office '02, requires 13 hr prep///a.[email or web address]  Patient came thru ER and had a 1 hour pre-medication and did fine  . Oxycodone Other (See Comments)    Decreased appetite.  Sandrea Hammond [Ibandronic Acid]     Nausea, vomiting, weakness  . Codeine Diarrhea and Nausea And Vomiting  . Darvocet [Propoxyphene N-Acetaminophen] Diarrhea and Nausea And Vomiting  . Erythromycin Nausea And Vomiting    Outpatient Encounter Medications as of 12/18/2019  Medication Sig  . alendronate (FOSAMAX) 70 MG tablet TAKE 1 TAB ONCE A WEEK, AT  LEAST 30 MIN BEFORE 1ST FOOD.DO NOT LIE DOWN FOR 30 MIN AFTER TAKING. (Patient taking differently: Take 70 mg by mouth once a week. )  . aspirin EC 81 MG tablet Take 81 mg by mouth daily. Swallow whole.  Marland Kitchen atorvastatin (LIPITOR) 20 MG tablet Take 1 tablet (20 mg total) by mouth in the morning.  . Calcium Carb-Cholecalciferol (CALCIUM 600+D3 PO) Take 600 mg by mouth daily.   . clopidogrel (PLAVIX) 75 MG tablet Take 1 tablet (75 mg total) by mouth daily.  Marland Kitchen docusate sodium (STOOL SOFTENER) 100 MG capsule Take 100 mg by mouth at bedtime.  Marland Kitchen lisinopril (ZESTRIL) 20 MG tablet Take 1  tablet (20 mg total) by mouth daily.  . metoprolol tartrate (LOPRESSOR) 25 MG tablet TAKE 1 TABLET BY MOUTH TWICE DAILY. (Patient taking differently: Take 25 mg by mouth 2 (two) times daily. )  . pantoprazole (PROTONIX) 40 MG tablet Take 1 tablet (40 mg total) by mouth daily.  . sertraline (ZOLOFT) 50 MG tablet Take 50 mg by mouth daily.   No facility-administered encounter medications on file as of 12/18/2019.    Review of Systems:  Review of Systems  Constitutional: Positive for activity change.  HENT: Negative.   Respiratory: Negative.   Cardiovascular: Negative.   Genitourinary: Negative.   Musculoskeletal: Positive for gait problem.  Skin: Negative.   Neurological: Negative for dizziness.  Psychiatric/Behavioral: Positive for dysphoric mood.    Health Maintenance  Topic Date Due  . TETANUS/TDAP  07/28/2019  . INFLUENZA VACCINE  Completed  . DEXA SCAN  Completed  . COVID-19 Vaccine  Completed  . PNA vac Low Risk Adult  Completed    Physical Exam: Vitals:   12/18/19 1132  BP: 140/88  Pulse: (!) 50  Temp: (!) 97.5 F (36.4 C)  SpO2: 97%  Weight: 143 lb (64.9 kg)  Height: 5\' 2"  (1.575 m)   Body mass index is 26.16 kg/m. Physical Exam Vitals reviewed.  Constitutional:      Appearance: Normal appearance.  HENT:     Head: Normocephalic.     Nose: Nose normal.     Mouth/Throat:     Mouth: Mucous membranes are moist.     Pharynx: Oropharynx is clear.  Eyes:     Pupils: Pupils are equal, round, and reactive to light.  Cardiovascular:     Rate and Rhythm: Bradycardia present.     Pulses: Normal pulses.     Heart sounds: Normal heart sounds.  Pulmonary:     Effort: Pulmonary effort is normal.     Breath sounds: Normal breath sounds.  Abdominal:     General: Abdomen is flat.     Palpations: Abdomen is soft.  Musculoskeletal:        General: No swelling.     Cervical back: Neck supple.  Skin:    General: Skin is warm.  Neurological:     General: No focal  deficit present.     Mental Status: She is alert and oriented to person, place, and time.     Comments: Has significant Kyphosis now. But gait is stable  Psychiatric:        Mood and Affect: Mood normal.        Thought Content: Thought content normal.     Labs reviewed: Basic Metabolic Panel: Recent Labs    06/30/19 0715 09/02/19 1130 09/04/19 0437 10/29/19 0700 11/16/19 0949  NA 141   < > 139 139 140  K 3.7   < > 3.5  4.2 3.7  CL 106   < > 105 103 104  CO2 29   < > 26 28 27   GLUCOSE 94   < > 105* 99 93  BUN 11   < > 14 17 16   CREATININE 0.72   < > 0.83 0.87 0.90*  CALCIUM 9.9   < > 9.4 9.4 9.0  TSH 4.05  --   --   --   --    < > = values in this interval not displayed.   Liver Function Tests: Recent Labs    09/02/19 1130 10/29/19 0700 11/16/19 0949  AST 30 28 28   ALT 15 23 21   ALKPHOS 52  --   --   BILITOT 1.0 0.5 0.3  PROT 6.9 6.5 6.3  ALBUMIN 4.0  --   --    Recent Labs    05/12/19 0745 06/30/19 0715  LIPASE 81* 25   No results for input(s): AMMONIA in the last 8760 hours. CBC: Recent Labs    09/03/19 0331 09/03/19 0331 09/04/19 0437 10/29/19 0700 11/16/19 0949  WBC 6.6   < > 8.3 5.2 5.7  NEUTROABS 4.1  --   --  2,985 3,181  HGB 12.8   < > 13.9 13.2 12.9  HCT 39.3   < > 41.9 39.8 39.1  MCV 93.8   < > 93.9 92.3 91.8  PLT 220   < > 243 217 257   < > = values in this interval not displayed.   Lipid Panel: Recent Labs    05/12/19 0745 06/30/19 0715 09/03/19 0331  CHOL 84 129 137  HDL 41* 46* 50  LDLCALC 29 69 73  TRIG 67 65 71  CHOLHDL 2.0 2.8 2.7   Lab Results  Component Value Date   HGBA1C 5.3 09/03/2019    Procedures since last visit: No results found.  Assessment/Plan Essential hypertension Change Lisinopril to Losartan Check BP in 4 weeks  Cough Wants to try changing Lisinopril to Losartan If cough still not better ENT / Reval by Speech  Cerebrovascular accident (CVA) due to thrombosis of right posterior cerebral artery  (HCC) Discontonue Aspirin On Plavix Osteoporosis without current pathological fracture, unspecified osteoporosis type Fosamax Would eventually need repeat DEXA Depression with anxiety Continue same dose of Zoloft Wants to consider changing dose if symptoms not better by next visit Gastroesophageal reflux disease, unspecified whether esophagitis present On Protonix per GI Pure hypercholesterolemia On statin LDL less then 100 Constipation, unspecified constipation type Doing well with Colace    Labs/tests ordered:  * No order type specified * Next appt:  Visit date not found

## 2019-12-21 ENCOUNTER — Telehealth: Payer: Self-pay | Admitting: Internal Medicine

## 2019-12-21 MED ORDER — LOSARTAN POTASSIUM 50 MG PO TABS
50.0000 mg | ORAL_TABLET | Freq: Every day | ORAL | 3 refills | Status: DC
Start: 2019-12-21 — End: 2020-04-19

## 2019-12-21 NOTE — Telephone Encounter (Signed)
Pt said she \\was  supposed to have a script refilled Friday it was a new prescription that Chales Abrahams prescribed Friday. She Is wondering if we could re send the script.

## 2019-12-21 NOTE — Telephone Encounter (Signed)
Rx faxed to pharmacy  

## 2019-12-30 ENCOUNTER — Telehealth: Payer: Self-pay | Admitting: *Deleted

## 2019-12-30 NOTE — Telephone Encounter (Signed)
Patient called and stated that she needs a refill on her Sertraline.  Stated that she wants to have the dosage increased and Dr. Chales Abrahams told her that it could be increased if she needed it.  Patient is currently taking Sertraline 50mg  once daily. Would like this increased and sent to her pharmacy.  Please Advise.

## 2019-12-31 MED ORDER — SERTRALINE HCL 50 MG PO TABS: 75.0000 mg | ORAL_TABLET | Freq: Every day | ORAL | 2 refills | Status: DC

## 2019-12-31 NOTE — Telephone Encounter (Signed)
I will increase it to 75 mg . Will place the order

## 2019-12-31 NOTE — Telephone Encounter (Signed)
Patient notified and agreed.  

## 2020-01-14 ENCOUNTER — Encounter: Payer: Self-pay | Admitting: Nurse Practitioner

## 2020-01-14 DIAGNOSIS — Z961 Presence of intraocular lens: Secondary | ICD-10-CM | POA: Diagnosis not present

## 2020-01-14 DIAGNOSIS — H43813 Vitreous degeneration, bilateral: Secondary | ICD-10-CM | POA: Diagnosis not present

## 2020-01-14 DIAGNOSIS — H53461 Homonymous bilateral field defects, right side: Secondary | ICD-10-CM | POA: Diagnosis not present

## 2020-01-14 DIAGNOSIS — H26493 Other secondary cataract, bilateral: Secondary | ICD-10-CM | POA: Diagnosis not present

## 2020-01-21 ENCOUNTER — Encounter: Payer: Self-pay | Admitting: Nurse Practitioner

## 2020-01-21 ENCOUNTER — Non-Acute Institutional Stay: Payer: Medicare PPO | Admitting: Nurse Practitioner

## 2020-01-21 ENCOUNTER — Other Ambulatory Visit: Payer: Self-pay

## 2020-01-21 DIAGNOSIS — Z8673 Personal history of transient ischemic attack (TIA), and cerebral infarction without residual deficits: Secondary | ICD-10-CM

## 2020-01-21 DIAGNOSIS — R059 Cough, unspecified: Secondary | ICD-10-CM

## 2020-01-21 DIAGNOSIS — I10 Essential (primary) hypertension: Secondary | ICD-10-CM

## 2020-01-21 DIAGNOSIS — F418 Other specified anxiety disorders: Secondary | ICD-10-CM

## 2020-01-21 DIAGNOSIS — K219 Gastro-esophageal reflux disease without esophagitis: Secondary | ICD-10-CM

## 2020-01-21 DIAGNOSIS — K5901 Slow transit constipation: Secondary | ICD-10-CM

## 2020-01-21 DIAGNOSIS — M810 Age-related osteoporosis without current pathological fracture: Secondary | ICD-10-CM | POA: Diagnosis not present

## 2020-01-21 DIAGNOSIS — E7800 Pure hypercholesterolemia, unspecified: Secondary | ICD-10-CM

## 2020-01-21 NOTE — Assessment & Plan Note (Signed)
OP, takes Fosamax, Ca, Vit D, due DEXA

## 2020-01-21 NOTE — Assessment & Plan Note (Signed)
Hyperlipidemia, takes Atorvastatin, LDL 73 09/03/19  

## 2020-01-21 NOTE — Progress Notes (Signed)
Location:   cl[facility]   Place of Service:  Clinic (12) Provider: Marlana Latus NP  Code Status: DNR Goals of Care: [place] 11/05/2019  Does Patient Have a Medical Advance Directive? Yes  Type of Advance Directive Living will  Does patient want to make changes to medical advance directive? No - Patient declined  Copy of [place] in Chart? -  Pre-existing out of facility DNR order (yellow form or pink MOST form) -     Chief Complaint  Patient presents with  . Medical Management of Chronic Issues    Patient returns to the clinic to follow up on her blood pressure and discuss her future dental procedures.    HPI: Patient is a 85 y.o. female seen today for medical management of chronic diseases.    HTN, switched to Losartan 12/18/19 due to cough, takes  Metoprolol. Bun/creat 16/09 11/16/19 Constipation, takes Colace qd.  GERD, takes Pantoprazole.  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 Takes Plavix. Atorvastatin.  Chronic occasional dry coughfor about a year, left side throat tickles sometimes during eating, then cough, ? Resultant of CVA.. ST: no apparent swallowing problem.May be ENT in the future.   OP, takes Fosamax, Ca, Vit D, due DEXA Depression, takes Sertraline.  Pericardial effusion, resolved Hyperlipidemia, takes Atorvastatin, LDL 73 09/03/19    Past Medical History:  Diagnosis Date  . Anginal pain ([place]) 08/24/2010   Echo-EF =>55%,LV normal  . Arthritis   . Cataract 12/2006   Both eyes  Epes MD  . Chest pain, unspecified 09/04/2007   Lexiscan-- EF 72%; LV normal  . Constipation    Takes flax seed and honey .  Smooth Move tea.  . Depression   . Diverticulitis   . Head injury, closed    with fall  . Hypertension   . Mitral valve prolapse   . Osteopenia   . Stroke [facility])  09/02/2019    Past Surgical History:  Procedure Laterality Date  . ANTERIOR CERVICAL DECOMP/DISCECTOMY FUSION N/A 10/23/2012   Procedure: Cervical Five to Cervical Six, Cervical Six to Cervical Seven anterior cervical decompression with fusion plating and bonegraft;  Surgeon: Hosie Spangle, MD;  Location: [place] [facility];  Service: Neurosurgery;  Laterality: N/A;  ANTERIOR CERVICAL DECOMPRESSION/DISCECTOMY FUSION 2 LEVELS  . BREAST CYST ASPIRATION  1990   benign  . CARDIAC CATHETERIZATION  08/01/2010   normal coronaries  . COLONOSCOPY  approx 2011  . EYE SURGERY Bilateral 2011  . intestinal blockage surgery  Feb. 2012   "kink in small intestine" per pt    Allergies  Allergen Reactions  . Contrast Media [Iodinated Diagnostic Agents] Hives    Hives during IVP at urology office '02, requires 13 hr prep///[email or web address]  Patient came thru ER and had a 1 hour pre-medication and did fine  . Oxycodone Other (See Comments)    Decreased appetite.  Jaclyn Prime [Ibandronic Acid]     Nausea, vomiting, weakness  . Codeine Diarrhea and Nausea And Vomiting  . Darvocet [Propoxyphene N-Acetaminophen] Diarrhea and Nausea And Vomiting  . Erythromycin Nausea And Vomiting    Allergies as of 01/21/2020      Reactions   Contrast Media [iodinated Diagnostic Agents] Hives   Hives during IVP at urology office '02, requires 13 hr prep///[email or web address] Patient came thru ER and had a 1 hour pre-medication and did fine   Oxycodone Other (See Comments)   Decreased appetite.  Boniva [ibandronic Acid]    Nausea, vomiting, weakness   Codeine Diarrhea, Nausea And Vomiting   Darvocet [propoxyphene N-acetaminophen] Diarrhea, Nausea And Vomiting   Erythromycin Nausea And Vomiting      Medication List       Accurate as of January 21, 2020 11:59 PM. If you have any questions, ask your nurse or doctor.        alendronate 70 MG tablet Commonly known as: FOSAMAX TAKE 1 TAB ONCE A WEEK, AT LEAST 30 MIN BEFORE 1ST  FOOD.DO NOT LIE DOWN FOR 30 MIN AFTER TAKING. What changed: See the new instructions.   atorvastatin 20 MG tablet Commonly known as: LIPITOR Take 1 tablet (20 mg total) by mouth in the morning.   CALCIUM 600+D3 PO Take 600 mg by mouth daily.   clopidogrel 75 MG tablet Commonly known as: PLAVIX Take 1 tablet (75 mg total) by mouth daily.   losartan 50 MG tablet Commonly known as: COZAAR Take 1 tablet (50 mg total) by mouth daily.   metoprolol tartrate 25 MG tablet Commonly known as: LOPRESSOR TAKE 1 TABLET BY MOUTH TWICE DAILY.   pantoprazole 40 MG tablet Commonly known as: Protonix Take 1 tablet (40 mg total) by mouth daily.   sertraline 50 MG tablet Commonly known as: ZOLOFT Take 1.5 tablets (75 mg total) by mouth daily.   Stool Softener 100 MG capsule Generic drug: docusate sodium Take 100 mg by mouth at bedtime.       Review of Systems:  Review of Systems  Constitutional: Negative for activity change, appetite change and fever.  HENT: Positive for hearing loss. Negative for congestion, trouble swallowing and voice change.   Eyes: Negative for visual disturbance.  Respiratory: Positive for cough. Negative for chest tightness, shortness of breath and wheezing.        Chronic occasional dry cough, improved   Cardiovascular: Negative for chest pain, palpitations and leg swelling.  Gastrointestinal: Negative for abdominal pain, constipation, nausea and vomiting.       NV x1 Tuesday 11/10/19, then 3 loose stools today Thursday 11/12/19  Genitourinary: Negative for dysuria, frequency and urgency.  Musculoskeletal: Positive for arthralgias, back pain and gait problem.       Lateral left eyebrow bone aches/pain on and off, usually last 5-10 minutes, not severe enough to take Tylenol, where she stuck her head when she fell in the past, frequency is about 1/1-2 days, gets better with no intervention. No change of vision, dizziness, or focal weakness associated with the event.    Skin: Negative for color change.  Neurological: Positive for weakness. Negative for dizziness, facial asymmetry, speech difficulty and headaches.       Memory lapses occasionally. Left sided weakness.   Psychiatric/Behavioral: Negative for behavioral problems and sleep disturbance. The patient is not nervous/anxious.        Depressed, not interested in activities, doesn't want to live like this    Health Maintenance  Topic Date Due  . TETANUS/TDAP  07/28/2019  . INFLUENZA VACCINE  Completed  . DEXA SCAN  Completed  . COVID-19 Vaccine  Completed  . PNA vac Low Risk Adult  Completed    Physical Exam: Vitals:   01/21/20 1628 01/21/20 1645  BP: (!) 172/100 (!) 148/78  Pulse: (!) 57   Temp: 98.3 F (36.8 C)   SpO2: 97%   Weight: 145 lb 9.6 oz (66 kg)   Height: '5\' 2"'  (1.575 m)    Body mass index is 26.63 kg/m. Physical Exam  Vitals and nursing note reviewed.  Constitutional:      Appearance: Normal appearance.  HENT:     Head: Normocephalic and atraumatic.     Mouth/Throat:     Mouth: Mucous membranes are moist.  Eyes:     Extraocular Movements: Extraocular movements intact.     Conjunctiva/sclera: Conjunctivae normal.     Pupils: Pupils are equal, round, and reactive to light.     Comments: Lateral right eye peripheral vision field loss since CVA 11/2018  Cardiovascular:     Rate and Rhythm: Normal rate and regular rhythm.     Heart sounds: No murmur heard.      Comments: HR 54 bpm Pulmonary:     Effort: Pulmonary effort is normal.     Breath sounds: No rales.  Abdominal:     General: Bowel sounds are normal.     Palpations: Abdomen is soft.     Tenderness: There is no abdominal tenderness.  Musculoskeletal:     Cervical back: Normal range of motion and neck supple.     Right lower leg: No edema.     Left lower leg: No edema.     Comments: Kyphoscoliosis.   Skin:    General: Skin is warm and dry.     Findings: Bruising present.     Comments: Bruise, contusion  periorbital lateral left eye/eyebrow. Resolving ecchymoses left upper arm, left wrist/hand.   Neurological:     General: No focal deficit present.     Mental Status: She is alert and oriented to person, place, and time. Mental status is at baseline.     Cranial Nerves: No cranial nerve deficit.     Motor: Weakness present.     Coordination: Coordination normal.     Gait: Gait abnormal.     Deep Tendon Reflexes: Reflexes normal.     Comments: Walker for ambulation. Left sided weakness with muscle strength 5/5.  Psychiatric:        Mood and Affect: Mood normal.        Behavior: Behavior normal.     Labs reviewed: Basic Metabolic Panel: Recent Labs    06/30/19 0715 09/02/19 1130 09/04/19 0437 10/29/19 0700 11/16/19 0949  NA 141   < > 139 139 140  K 3.7   < > 3.5 4.2 3.7  CL 106   < > 105 103 104  CO2 29   < > '26 28 27  ' GLUCOSE 94   < > 105* 99 93  BUN 11   < > '14 17 16  ' CREATININE 0.72   < > 0.83 0.87 0.90*  CALCIUM 9.9   < > 9.4 9.4 9.0  TSH 4.05  --   --   --   --    < > = values in this interval not displayed.   Liver Function Tests: Recent Labs    09/02/19 1130 10/29/19 0700 11/16/19 0949  AST '30 28 28  ' ALT '15 23 21  ' ALKPHOS 52  --   --   BILITOT 1.0 0.5 0.3  PROT 6.9 6.5 6.3  ALBUMIN 4.0  --   --    Recent Labs    05/12/19 0745 06/30/19 0715  LIPASE 81* 25   No results for input(s): AMMONIA in the last 8760 hours. CBC: Recent Labs    09/03/19 0331 09/03/19 0331 09/04/19 0437 10/29/19 0700 11/16/19 0949  WBC 6.6   < > 8.3 5.2 5.7  NEUTROABS 4.1  --   --  2,985  3,181  HGB 12.8   < > 13.9 13.2 12.9  HCT 39.3   < > 41.9 39.8 39.1  MCV 93.8   < > 93.9 92.3 91.8  PLT 220   < > 243 217 257   < > = values in this interval not displayed.   Lipid Panel: Recent Labs    05/12/19 0745 06/30/19 0715 09/03/19 0331  CHOL 84 129 137  HDL 41* 46* 50  LDLCALC 29 69 73  TRIG 67 65 71  CHOLHDL 2.0 2.8 2.7   Lab Results  Component Value Date   HGBA1C  5.3 09/03/2019    Procedures since last visit: No results found.  Assessment/Plan  Hypertension HTN, switched to Losartan 12/18/19 due to cough, takes  Metoprolol. Bun/creat 16/09 11/16/19   Slow transit constipation Constipation, takes Colace qd.   GERD (gastroesophageal reflux disease) GERD, takes Pantoprazole.    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 Takes Plavix. Atorvastatin.   Cough Chronic occasional dry coughfor about a year, left side throat tickles sometimes during eating, then cough, ? Resultant of CVA.. ST: no apparent swallowing problem.May be ENT in the future.    Osteoporosis OP, takes Fosamax, Ca, Vit D, due DEXA   Depression with anxiety Depression, takes Sertraline.    Pure hypercholesterolemia Hyperlipidemia, takes Atorvastatin, LDL 73 09/03/19      Labs/tests ordered:  CBC/diff, CMP/eGFR  Next appt:  3 months

## 2020-01-21 NOTE — Assessment & Plan Note (Signed)
Chronic occasional dry coughfor about a year, left side throat tickles sometimes during eating, then cough, ? Resultant of CVA.. ST: no apparent swallowing problem.May be ENT in the future.

## 2020-01-21 NOTE — Assessment & Plan Note (Signed)
Constipation, takes Colace qd 

## 2020-01-21 NOTE — Assessment & Plan Note (Signed)
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 Takes Plavix. Atorvastatin.

## 2020-01-21 NOTE — Assessment & Plan Note (Signed)
GERD, takes Pantoprazole  

## 2020-01-21 NOTE — Assessment & Plan Note (Signed)
Depression, takes Sertraline.  

## 2020-01-21 NOTE — Assessment & Plan Note (Signed)
HTN, switched to Losartan 12/18/19 due to cough, takes  Metoprolol. Bun/creat 16/09 11/16/19

## 2020-01-22 ENCOUNTER — Encounter: Payer: Self-pay | Admitting: Nurse Practitioner

## 2020-01-27 ENCOUNTER — Encounter: Payer: Self-pay | Admitting: Cardiology

## 2020-01-27 ENCOUNTER — Telehealth (INDEPENDENT_AMBULATORY_CARE_PROVIDER_SITE_OTHER): Payer: Medicare PPO | Admitting: Cardiology

## 2020-01-27 VITALS — Ht 66.0 in | Wt 144.0 lb

## 2020-01-27 DIAGNOSIS — Z8673 Personal history of transient ischemic attack (TIA), and cerebral infarction without residual deficits: Secondary | ICD-10-CM | POA: Diagnosis not present

## 2020-01-27 DIAGNOSIS — Z7189 Other specified counseling: Secondary | ICD-10-CM | POA: Diagnosis not present

## 2020-01-27 DIAGNOSIS — E7800 Pure hypercholesterolemia, unspecified: Secondary | ICD-10-CM

## 2020-01-27 DIAGNOSIS — I10 Essential (primary) hypertension: Secondary | ICD-10-CM | POA: Diagnosis not present

## 2020-01-27 NOTE — Patient Instructions (Signed)
Medication Instructions:  No Changes In Medications at this time.  *If you need a refill on your cardiac medications before your next appointment, please call your pharmacy*  Follow-Up: At [facility], you and your health needs are our priority.  As part of our continuing mission to provide you with exceptional heart care, we have created designated Provider Care Teams.  These Care Teams include your primary Cardiologist (physician) and Advanced Practice Providers (APPs -  Physician Assistants and Nurse Practitioners) who all work together to provide you with the care you need, when you need it.  We recommend signing up for the patient portal called "MyChart".  Sign up information is provided on this After Visit Summary.  MyChart is used to connect with patients for Virtual Visits (Telemedicine).  Patients are able to view lab/test results, encounter notes, upcoming appointments, etc.  Non-urgent messages can be sent to your provider as well.   To learn more about what you can do with MyChart, go to [email or web address].    Your next appointment:   6 month(s)  The format for your next appointment:   In Person  Provider:   [name], MD

## 2020-01-27 NOTE — Progress Notes (Signed)
Virtual Visit via Telephone Note   This visit type was conducted due to national recommendations for restrictions regarding the COVID-19 Pandemic (e.g. social distancing) in an effort to limit this patient's exposure and mitigate transmission in our community.  Due to her co-morbid illnesses, this patient is at least at moderate risk for complications without adequate follow up.  This format is felt to be most appropriate for this patient at this time.  The patient did not have access to video technology/had technical difficulties with video requiring transitioning to audio format only (telephone).  All issues noted in this document were discussed and addressed.  No physical exam could be performed with this format.  Please refer to the patient's chart for her  consent to telehealth for [facility].   The patient was identified using 2 identifiers.  Patient Location: Home Provider Location: Home Office  Date:  01/27/2020   ID:  Adriana [name], DOB 1934-09-12, MRN [id number]  PCP:  Virgie Dad, MD  Cardiologist:  Buford Dresser, MD  Referring MD: Virgie Dad, MD   CC: follow up  History of Present Illness:    Adriana [name] is a 85 y.o. female with a hx of CVA 11/2018 and 08/2019, hypertension, hyperlipidemia who is seen for follow up today. I initially met her 01/22/19 as a new consult at the request of Virgie Dad, MD for the evaluation and management of pericardial effusion on echo.  Today: Followed by [facility] at the [facility]. Notes reviewed. Had another stroke 08/2019, diagnosed after fall and left side weakness, notes reviewed. Found to have acute right thalamic infarct, thought to be small vessel disease. Was changed from aspirin to clopidogrel [place] term after 3 mos of DAPT. Continued on atorvastatin. Struggling with balance and strength since this most recent stroke, still has residual vision issues from prior stroke.  Doing well overall. Needs to  have dental work. Has previously had antibiotics in the past as she was told many years ago that she has mitral valve prolapse. We reviewed her echoes, which have not shown mitral valve prolapse recently, and also that guidelines now do not recommend antibiotics with mitral valve prolapse. She is planning on having the dental work in January, so she is 6 mos past her stroke. No pain.  Blood pressure was high around the time of her stroke, but recently she reports good control. No reading yet this AM.  Denies chest pain, shortness of breath at rest or with normal exertion. No PND, orthopnea, LE edema or unexpected weight gain. No syncope or palpitations.  Past Medical History:  Diagnosis Date  . Anginal pain ([place]) 08/24/2010   Echo-EF =>55%,LV normal  . Arthritis   . Cataract 12/2006   Both eyes  Epes MD  . Chest pain, unspecified 09/04/2007   Lexiscan-- EF 72%; LV normal  . Constipation    Takes flax seed and honey .  Smooth Move tea.  . Depression   . Diverticulitis   . Head injury, closed    with fall  . Hypertension   . Mitral valve prolapse   . Osteopenia   . Stroke [facility]) 09/02/2019    Past Surgical History:  Procedure Laterality Date  . ANTERIOR CERVICAL DECOMP/DISCECTOMY FUSION N/A 10/23/2012   Procedure: Cervical Five to Cervical Six, Cervical Six to Cervical Seven anterior cervical decompression with fusion plating and bonegraft;  Surgeon: Hosie Spangle, MD;  Location: [place] [facility];  Service: Neurosurgery;  Laterality:  N/A;  ANTERIOR CERVICAL DECOMPRESSION/DISCECTOMY FUSION 2 LEVELS  . BREAST CYST ASPIRATION  1990   benign  . CARDIAC CATHETERIZATION  08/01/2010   normal coronaries  . COLONOSCOPY  approx 2011  . EYE SURGERY Bilateral 2011  . intestinal blockage surgery  Feb. 2012   "kink in small intestine" per pt    Current Medications: Current Outpatient Medications on File Prior to Visit  Medication Sig  . alendronate (FOSAMAX) 70 MG tablet TAKE 1 TAB ONCE A  WEEK, AT LEAST 30 MIN BEFORE 1ST FOOD.DO NOT LIE DOWN FOR 30 MIN AFTER TAKING. (Patient taking differently: Take 70 mg by mouth once a week. )  . atorvastatin (LIPITOR) 20 MG tablet Take 1 tablet (20 mg total) by mouth in the morning.  . Calcium Carb-Cholecalciferol (CALCIUM 600+D3 PO) Take 600 mg by mouth daily.   . clopidogrel (PLAVIX) 75 MG tablet Take 1 tablet (75 mg total) by mouth daily.  Marland Kitchen docusate sodium (STOOL SOFTENER) 100 MG capsule Take 100 mg by mouth at bedtime.  Marland Kitchen losartan (COZAAR) 50 MG tablet Take 1 tablet (50 mg total) by mouth daily.  . metoprolol tartrate (LOPRESSOR) 25 MG tablet TAKE 1 TABLET BY MOUTH TWICE DAILY. (Patient taking differently: Take 25 mg by mouth 2 (two) times daily. )  . pantoprazole (PROTONIX) 40 MG tablet Take 1 tablet (40 mg total) by mouth daily.  . sertraline (ZOLOFT) 50 MG tablet Take 1.5 tablets (75 mg total) by mouth daily.   No current facility-administered medications on file prior to visit.     Allergies:   Contrast media [iodinated diagnostic agents], Oxycodone, Boniva [ibandronic acid], Codeine, Darvocet [propoxyphene n-acetaminophen], and Erythromycin   Social History   Tobacco Use  . Smoking status: Never Smoker  . Smokeless tobacco: Never Used  Vaping Use  . Vaping Use: Never used  Substance Use Topics  . Alcohol use: Yes    Alcohol/week: 4.0 standard drinks    Types: 2 Glasses of wine, 2 Cans of beer per week    Comment: occasionally  . Drug use: No    Family History: family history includes CVA in her mother; Cancer in her father; Congestive Heart Failure in her father; Coronary artery disease in her father; Dementia in her mother; Depression in her father; Diabetes in her brother and father; Heart failure in her father; Hypertension in her mother; Parkinson's disease in her mother; Prostate cancer in her brother and paternal grandmother; Transient ischemic attack in her mother.  ROS:   Please see the history of present  illness.  Additional pertinent ROS otherwise unremarkable.     EKGs/Labs/Other Studies Reviewed:    The following studies were reviewed today: Monitor 1.4.21 Sinus rhythm Average heart rates 62 bpm (range 44-110 bpm) No prolonged pauses or profound bradycardias No atrial fibrillation  Echo 02/11/19 1. Left ventricular ejection fraction, by visual estimation, is 60 to  65%. The left ventricle has normal function. There is no left ventricular  hypertrophy.  2. Elevated left ventricular end-diastolic pressure.  3. Left ventricular diastolic parameters are consistent with Grade I  diastolic dysfunction (impaired relaxation).  4. Global right ventricle has normal systolic function.The right  ventricular size is normal. No increase in right ventricular wall  thickness.  5. Left atrial size was normal.  6. Right atrial size was normal.  7. Small to moderate pericardial effusion with no significant change  compared to the prior exam. No evidence of tamponade physiology. Mild  right atrial collapse stable as compared to  previous. No septal shudder,  no respiratory related septal shift. No  respiratory variation in mitral inflow. Hepatic veins poorly visualized  for Doppler assessment.  8. The pericardial effusion is primarily anterior to the right ventricle.  Small effusion posterior to the left ventricle.  9. The mitral valve is normal in structure. Trivial mitral valve  regurgitation. No evidence of mitral stenosis.  10. The tricuspid valve is normal in structure. Tricuspid valve  regurgitation is trivial.  11. The aortic valve is tricuspid. Aortic valve regurgitation is not  visualized. No evidence of aortic valve sclerosis or stenosis.  12. The pulmonic valve was normal in structure. Pulmonic valve  regurgitation is trivial.  13. The left ventricle has no regional wall motion abnormalities.   Echo 12/22/18 1. There is a small to moderate loculated pericardial effusion  present over the anterior RV. There is no echocardiographic evidence of tamponade.  2. Left ventricular ejection fraction, by visual estimation, is 60 to 65%. The left ventricle has normal function. Normal left ventricular size. There is borderline left ventricular hypertrophy.  3. Left ventricular diastolic Doppler parameters are consistent with impaired relaxation pattern of LV diastolic filling.  4. Global right ventricle has normal systolic function.The right ventricular size is normal. No increase in right ventricular wall thickness.  5. Left atrial size was normal.  6. Right atrial size was normal.  7. Moderate pericardial effusion.  8. The pericardial effusion is anterior to the right ventricle.  9. Mild thickening of the anterior and posterior mitral valve leaflet(s). 10. The mitral valve is myxomatous. No evidence of mitral valve regurgitation. 11. The tricuspid valve is normal in structure. Tricuspid valve regurgitation is trivial. 12. The aortic valve is tricuspid Aortic valve regurgitation was not visualized by color flow Doppler. Structurally normal aortic valve, with no evidence of sclerosis or stenosis. 13. There is Mild calcification of the aortic valve. 14. There is Mild thickening of the aortic valve. 15. The pulmonic valve was grossly normal. Pulmonic valve regurgitation is not visualized by color flow Doppler. 16. Mild plaque invoving the ascending aorta. 17. Normal pulmonary artery systolic pressure. 18. The tricuspid regurgitant velocity is 2.02 m/s, and with an assumed right atrial pressure of 3 mmHg, the estimated right ventricular systolic pressure is normal at 19.4 mmHg. 19. The inferior vena cava is normal in size with greater than 50% respiratory variability, suggesting right atrial pressure of 3 mmHg.  Heart cath 08/02/2010 HEMODYNAMIC RESULTS: 1. Aortic systolic pressure 782, diastolic pressure 956. 2. Left ventricular systolic pressure 213, end-diastolic pressure  22. 3. RA pressure A-wave 4, V-wave 2, and mean 1. 4. RV pressure systolic 29, end-diastolic pressure 2. 5. PA pressure systolic 27, diastolic pressure 4, and mean 12. 6. Pulmonary capillary wedge pressure A-wave 4, V-wave 4, and mean 2.  SELECTIVE CORONARY ANGIOGRAPHY: 1. Left main normal. 2. LAD normal. 3. Left circumflex normal. 4. Right coronary artery was dominant and normal.  Ventriculography; RAO left ventriculogram was performed using 25 mL Visipaque dye at 12 mL per second.  The overall LVEF was estimated greater than 60% without focal wall motion abnormalities.  Supravalvular aortography; the supravalvular aortogram was performed using 20 mL of Visipaque dye at 20 mL per second.  Aortic root was normal in caliber.  There was no dissection.  There is no AI.  Arch vessels were intact.  IMPRESSION:  Ms. Vantassell has essentially normal coronary artery, normal LV function, and normal supravalvular aorta and low filling pressures suggesting she is dry.  She certainly is not in tamponade.  The etiology of her chest pain is not cardiac.  She may have pericarditis.  Continued medical therapy will be recommended.  Echo 08/01/2010: Normal EF, small to moderate pericardial effusion  EKG:  EKG is personally reviewed.  The ekg ordered 02/02/19 demonstrates sinus bradycardia  Recent Labs: 06/30/2019: TSH 4.05 11/16/2019: ALT 21; BUN 16; Creat 0.90; Hemoglobin 12.9; Platelets 257; Potassium 3.7; Sodium 140  Recent Lipid Panel    Component Value Date/Time   CHOL 137 09/03/2019 0331   TRIG 71 09/03/2019 0331   HDL 50 09/03/2019 0331   CHOLHDL 2.7 09/03/2019 0331   VLDL 14 09/03/2019 0331   LDLCALC 73 09/03/2019 0331   LDLCALC 69 06/30/2019 0715    Physical Exam:    VS:  Ht _0  (1.676 m)   Wt 144 lb (65.3 kg)   BMI 23.24 kg/m     Wt Readings from Last 3 Encounters:  01/27/20 144 lb (65.3 kg)  01/21/20 145 lb 9.6 oz (66 kg)  12/18/19 143 lb (64.9 kg)    Speaking  comfortably on the phone, no audible wheezing In no acute distress Alert and oriented Normal affect Normal speech  ASSESSMENT:    1. History of CVA (cerebrovascular accident)   2. Essential hypertension   3. Pure hypercholesterolemia   4. Cardiac risk counseling    PLAN:    Questions re: dental work -she had a prior history of reported mitral valve prolapse and was on antibiotics pre-dental work in the past. We discussed that her most recent echoes doe not show MVP, and additionally MVP is no longer in the guidelines for valve disease that requires pre-procedure antibiotics -she personally would like to hold off on dental work until she is six months out from her most recent stroke. This seems reasonable. She is in no discomfort with her current dental issues  History of recurrent CVA Hypertension Hypercholesterolemia  -monitor did not show atrial fibrillation previously -was on DAPT for 3 mos, now on clopidogrel alone -on atorvastatin 20 mg daily. LDL goal <70. Last LDL 73, previously <70. Does not wish to make changes, but if remains above goal on recheck, would again recommend increasing atorvastatin to 40 mg daily. -reports blood pressure is well controlled on losartan and metoprolol. No changes today  History of pericardial effusion: noted on echo in 2012, cath hemodynamics not suggestive of tamponade -limited echo 02/2019 stable  Cardiac risk counseling and prevention recommendations: -recommend heart healthy/Mediterranean diet, with whole grains, fruits, vegetable, fish, lean meats, nuts, and olive oil. Limit salt. -recommend moderate walking, 3-5 times/week for 30-50 minutes each session. Aim for at least 150 minutes.week. Goal should be pace of 3 miles/hours, or walking 1.5 miles in 30 minutes -recommend avoidance of tobacco products. Avoid excess alcohol.  Plan for follow up: due for normal follow up in 6 mos  Today, I have spent 15 minutes with the patient with  telehealth technology discussing the above problems.  Additional time spent in chart review, documentation, and communication.  Medication Adjustments/Labs and Tests Ordered: Current medicines are reviewed at length with the patient today.  Concerns regarding medicines are outlined above.  No orders of the defined types were placed in this encounter.  No orders of the defined types were placed in this encounter.   Patient Instructions  Medication Instructions:  No Changes In Medications at this time.  *If you need a refill on your cardiac medications before your next appointment, please call your  pharmacy*  Follow-Up: At [facility], you and your health needs are our priority.  As part of our continuing mission to provide you with exceptional heart care, we have created designated Provider Care Teams.  These Care Teams include your primary Cardiologist (physician) and Advanced Practice Providers (APPs -  Physician Assistants and Nurse Practitioners) who all work together to provide you with the care you need, when you need it.  We recommend signing up for the patient portal called "MyChart".  Sign up information is provided on this After Visit Summary.  MyChart is used to connect with patients for Virtual Visits (Telemedicine).  Patients are able to view lab/test results, encounter notes, upcoming appointments, etc.  Non-urgent messages can be sent to your provider as well.   To learn more about what you can do with MyChart, go to [email or web address].    Your next appointment:   6 month(s)  The format for your next appointment:   In Person  Provider:   Buford Dresser, MD    Signed, Buford Dresser, MD PhD 01/27/2020  [place]

## 2020-01-28 ENCOUNTER — Encounter: Payer: Self-pay | Admitting: Cardiology

## 2020-02-16 ENCOUNTER — Other Ambulatory Visit: Payer: Self-pay | Admitting: Internal Medicine

## 2020-02-16 DIAGNOSIS — M810 Age-related osteoporosis without current pathological fracture: Secondary | ICD-10-CM

## 2020-02-16 NOTE — Telephone Encounter (Signed)
Patient needs refill on medication "Alendronate 70mg ". Last refill was 07/07/2019 with 12 tablets and 1 refill. Medication is to be taken once a week. Patient is due for refills. Medication has contraindication. Medication pend and sent to pcp 09/06/2019, MD . Please Advise.

## 2020-03-11 ENCOUNTER — Other Ambulatory Visit: Payer: Self-pay | Admitting: Internal Medicine

## 2020-03-15 ENCOUNTER — Telehealth: Payer: Self-pay

## 2020-03-15 NOTE — Telephone Encounter (Signed)
Patient called stating she is having difficulty standing and walking due to her leg weakness from previous stroke. No clinic availability this week at [facility]. She will call and see if transportation is able to bring her to [facility] this week and call back if so.

## 2020-04-05 ENCOUNTER — Telehealth: Payer: Self-pay

## 2020-04-05 ENCOUNTER — Other Ambulatory Visit: Payer: Self-pay

## 2020-04-05 ENCOUNTER — Ambulatory Visit (INDEPENDENT_AMBULATORY_CARE_PROVIDER_SITE_OTHER): Payer: Medicare PPO | Admitting: Nurse Practitioner

## 2020-04-05 ENCOUNTER — Encounter: Payer: Self-pay | Admitting: Nurse Practitioner

## 2020-04-05 DIAGNOSIS — E2839 Other primary ovarian failure: Secondary | ICD-10-CM | POA: Diagnosis not present

## 2020-04-05 DIAGNOSIS — Z0000 Encounter for general adult medical examination without abnormal findings: Secondary | ICD-10-CM

## 2020-04-05 NOTE — Progress Notes (Signed)
This service is provided via telemedicine  No vital signs collected/recorded due to the encounter was a telemedicine visit.   Location of patient (ex: home, work): Home  Patient consents to a telephone visit:  Yes, see encounter dated 04/05/2020  Location of the provider (ex: office, home):  [facility]  Name of any referring provider:  Einar Crow, MD  Names of all persons participating in the telemedicine service and their role in the encounter:  Abbey Chatters, Nurse Practitioner, Elveria Royals, CMA, and patient.   Time spent on call: 10 minutes with medical assistant

## 2020-04-05 NOTE — Patient Instructions (Signed)
Adriana [name] , Thank you for taking time to come for your Medicare Wellness Visit. I appreciate your ongoing commitment to your health goals. Please review the following plan we discussed and let me know if I can assist you in the future.   Screening recommendations/referrals: Colonoscopy aged out Mammogram aged out Bone Density ordered today- [phone] to make appt  Recommended yearly ophthalmology/optometry visit for glaucoma screening and checkup Recommended yearly dental visit for hygiene and checkup  Vaccinations: Influenza vaccine  Up to date Pneumococcal vaccine- up to date Tdap vaccine up to date Shingles vaccine up to date.     Advanced directives: on file.   Conditions/risks identified: depression, fall risk.   Next appointment: yearly for AWV   Preventive Care 65 Years and Older, Female Preventive care refers to lifestyle choices and visits with your health care provider that can promote health and wellness. What does preventive care include?  A yearly physical exam. This is also called an annual well check.  Dental exams once or twice a year.  Routine eye exams. Ask your health care provider how often you should have your eyes checked.  Personal lifestyle choices, including:  Daily care of your teeth and gums.  Regular physical activity.  Eating a healthy diet.  Avoiding tobacco and drug use.  Limiting alcohol use.  Practicing safe sex.  Taking low-dose aspirin every day.  Taking vitamin and mineral supplements as recommended by your health care provider. What happens during an annual well check? The services and screenings done by your health care provider during your annual well check will depend on your age, overall health, lifestyle risk factors, and family history of disease. Counseling  Your health care provider may ask you questions about your:  Alcohol use.  Tobacco use.  Drug use.  Emotional well-being.  Home and relationship  well-being.  Sexual activity.  Eating habits.  History of falls.  Memory and ability to understand (cognition).  Work and work Astronomer.  Reproductive health. Screening  You may have the following tests or measurements:  Height, weight, and BMI.  Blood pressure.  Lipid and cholesterol levels. These may be checked every 5 years, or more frequently if you are over 65 years old.  Skin check.  Lung cancer screening. You may have this screening every year starting at age 38 if you have a 30-pack-year history of smoking and currently smoke or have quit within the past 15 years.  Fecal occult blood test (FOBT) of the stool. You may have this test every year starting at age 41.  Flexible sigmoidoscopy or colonoscopy. You may have a sigmoidoscopy every 5 years or a colonoscopy every 10 years starting at age 93.  Hepatitis C blood test.  Hepatitis B blood test.  Sexually transmitted disease (STD) testing.  Diabetes screening. This is done by checking your blood sugar (glucose) after you have not eaten for a while (fasting). You may have this done every 1-3 years.  Bone density scan. This is done to screen for osteoporosis. You may have this done starting at age 46.  Mammogram. This may be done every 1-2 years. Talk to your health care provider about how often you should have regular mammograms. Talk with your health care provider about your test results, treatment options, and if necessary, the need for more tests. Vaccines  Your health care provider may recommend certain vaccines, such as:  Influenza vaccine. This is recommended every year.  Tetanus, diphtheria, and acellular pertussis (Tdap, Td) vaccine. You  may need a Td booster every 10 years.  Zoster vaccine. You may need this after age 64.  Pneumococcal 13-valent conjugate (PCV13) vaccine. One dose is recommended after age 9.  Pneumococcal polysaccharide (PPSV23) vaccine. One dose is recommended after age  27. Talk to your health care provider about which screenings and vaccines you need and how often you need them. This information is not intended to replace advice given to you by your health care provider. Make sure you discuss any questions you have with your health care provider. Document Released: 03/18/2015 Document Revised: 11/09/2015 Document Reviewed: 12/21/2014 Elsevier Interactive Patient Education  2017 [place] Prevention in the Home Falls can cause injuries. They can happen to people of all ages. There are many things you can do to make your home safe and to help prevent falls. What can I do on the outside of my home?  Regularly fix the edges of walkways and driveways and fix any cracks.  Remove anything that might make you trip as you walk through a door, such as a raised step or threshold.  Trim any bushes or trees on the path to your home.  Use bright outdoor lighting.  Clear any walking paths of anything that might make someone trip, such as rocks or tools.  Regularly check to see if handrails are loose or broken. Make sure that both sides of any steps have handrails.  Any raised decks and porches should have guardrails on the edges.  Have any leaves, snow, or ice cleared regularly.  Use sand or salt on walking paths during winter.  Clean up any spills in your garage right away. This includes oil or grease spills. What can I do in the bathroom?  Use night lights.  Install grab bars by the toilet and in the tub and shower. Do not use towel bars as grab bars.  Use non-skid mats or decals in the tub or shower.  If you need to sit down in the shower, use a plastic, non-slip stool.  Keep the floor dry. Clean up any water that spills on the floor as soon as it happens.  Remove soap buildup in the tub or shower regularly.  Attach bath mats securely with double-sided non-slip rug tape.  Do not have throw rugs and other things on the floor that can make  you trip. What can I do in the bedroom?  Use night lights.  Make sure that you have a light by your bed that is easy to reach.  Do not use any sheets or blankets that are too big for your bed. They should not hang down onto the floor.  Have a firm chair that has side arms. You can use this for support while you get dressed.  Do not have throw rugs and other things on the floor that can make you trip. What can I do in the kitchen?  Clean up any spills right away.  Avoid walking on wet floors.  Keep items that you use a lot in easy-to-reach places.  If you need to reach something above you, use a strong step stool that has a grab bar.  Keep electrical cords out of the way.  Do not use floor polish or wax that makes floors slippery. If you must use wax, use non-skid floor wax.  Do not have throw rugs and other things on the floor that can make you trip. What can I do with my stairs?  Do not leave any items  on the stairs.  Make sure that there are handrails on both sides of the stairs and use them. Fix handrails that are broken or loose. Make sure that handrails are as [place] as the stairways.  Check any carpeting to make sure that it is firmly attached to the stairs. Fix any carpet that is loose or worn.  Avoid having throw rugs at the top or bottom of the stairs. If you do have throw rugs, attach them to the floor with carpet tape.  Make sure that you have a light switch at the top of the stairs and the bottom of the stairs. If you do not have them, ask someone to add them for you. What else can I do to help prevent falls?  Wear shoes that:  Do not have high heels.  Have rubber bottoms.  Are comfortable and fit you well.  Are closed at the toe. Do not wear sandals.  If you use a stepladder:  Make sure that it is fully opened. Do not climb a closed stepladder.  Make sure that both sides of the stepladder are locked into place.  Ask someone to hold it for you, if  possible.  Clearly mark and make sure that you can see:  Any grab bars or handrails.  First and last steps.  Where the edge of each step is.  Use tools that help you move around (mobility aids) if they are needed. These include:  Canes.  Walkers.  Scooters.  Crutches.  Turn on the lights when you go into a dark area. Replace any light bulbs as soon as they burn out.  Set up your furniture so you have a clear path. Avoid moving your furniture around.  If any of your floors are uneven, fix them.  If there are any pets around you, be aware of where they are.  Review your medicines with your doctor. Some medicines can make you feel dizzy. This can increase your chance of falling. Ask your doctor what other things that you can do to help prevent falls. This information is not intended to replace advice given to you by your health care provider. Make sure you discuss any questions you have with your health care provider. Document Released: 12/16/2008 Document Revised: 07/28/2015 Document Reviewed: 03/26/2014 Elsevier Interactive Patient Education  2017 [employer].

## 2020-04-05 NOTE — Progress Notes (Signed)
Subjective:   Adriana [name] is a 86 y.o. female who presents for Medicare Annual (Subsequent) preventive examination.  Review of Systems           Objective:    There were no vitals filed for this visit. There is no height or weight on file to calculate BMI.  Advanced Directives 04/05/2020 11/05/2019 09/16/2019 09/05/2019 09/02/2019 07/03/2019 06/09/2019  Does Patient Have a Medical Advance Directive? Yes Yes Yes Yes Yes Yes Yes  Type of Estate agent of [place];Living will Living will Living will Living will - Healthcare Power of [place];Living will Living will;Healthcare Power of Attorney  Does patient want to make changes to medical advance directive? No - Patient declined No - Patient declined - No - Patient declined - No - Patient declined No - Patient declined  Copy of Healthcare Power of Attorney in Chart? Yes - validated most recent copy scanned in chart (See row information) - - - - Yes - validated most recent copy scanned in chart (See row information) Yes - validated most recent copy scanned in chart (See row information)  Pre-existing out of facility DNR order (yellow form or pink MOST form) - - - - - - -    Current Medications (verified) Outpatient Encounter Medications as of 04/05/2020  Medication Sig  . alendronate (FOSAMAX) 70 MG tablet TAKE 1 TAB ONCE A WEEK, AT LEAST 30 MIN BEFORE 1ST FOOD.DO NOT LIE DOWN FOR 30 MIN AFTER TAKING.  Marland Kitchen atorvastatin (LIPITOR) 20 MG tablet Take 1 tablet (20 mg total) by mouth in the morning.  . Calcium Carb-Cholecalciferol (CALCIUM 600+D3 PO) Take 600 mg by mouth daily.   . clopidogrel (PLAVIX) 75 MG tablet Take 1 tablet (75 mg total) by mouth daily.  Marland Kitchen docusate sodium (COLACE) 100 MG capsule Take 100 mg by mouth at bedtime.  Marland Kitchen losartan (COZAAR) 50 MG tablet Take 1 tablet (50 mg total) by mouth daily.  . metoprolol tartrate (LOPRESSOR) 25 MG tablet TAKE 1 TABLET BY MOUTH TWICE DAILY.  . pantoprazole (PROTONIX) 40 MG tablet Take  1 tablet (40 mg total) by mouth daily.  . sertraline (ZOLOFT) 50 MG tablet Take 1.5 tablets (75 mg total) by mouth daily.   No facility-administered encounter medications on file as of 04/05/2020.    Allergies (verified) Contrast media [iodinated diagnostic agents], Oxycodone, Boniva [ibandronic acid], Codeine, Darvocet [propoxyphene n-acetaminophen], Erythromycin, and Other   History: Past Medical History:  Diagnosis Date  . Anginal pain (HCC) 08/24/2010   Echo-EF =>55%,LV normal  . Arthritis   . Cataract 12/2006   Both eyes  Epes MD  . Chest pain, unspecified 09/04/2007   Lexiscan-- EF 72%; LV normal  . Constipation    Takes flax seed and honey .  Smooth Move tea.  . Depression   . Diverticulitis   . Head injury, closed    with fall  . Hypertension   . Mitral valve prolapse   . Osteopenia   . Stroke [facility]) 09/02/2019   Past Surgical History:  Procedure Laterality Date  . ANTERIOR CERVICAL DECOMP/DISCECTOMY FUSION N/A 10/23/2012   Procedure: Cervical Five to Cervical Six, Cervical Six to Cervical Seven anterior cervical decompression with fusion plating and bonegraft;  Surgeon: Hewitt Shorts, MD;  Location: [facility];  Service: Neurosurgery;  Laterality: N/A;  ANTERIOR CERVICAL DECOMPRESSION/DISCECTOMY FUSION 2 LEVELS  . BREAST CYST ASPIRATION  1990   benign  . CARDIAC CATHETERIZATION  08/01/2010   normal coronaries  . COLONOSCOPY  approx 2011  . EYE SURGERY Bilateral 2011  . intestinal blockage surgery  Feb. 2012   "kink in small intestine" per pt   Family History  Problem Relation Age of Onset  . Hypertension Mother   . Transient ischemic attack Mother   . Parkinson's disease Mother   . Dementia Mother   . CVA Mother   . Diabetes Father   . Coronary artery disease Father   . Heart failure Father   . Cancer Father        prostate  . Depression Father   . Congestive Heart Failure Father   . Prostate cancer Brother   . Diabetes Brother   . Prostate cancer  Paternal Grandmother    Social History   Socioeconomic History  . Marital status: Widowed    Spouse name: Joe  . Number of children: 2  . Years of education: college  . Highest education level: Bachelor's degree (e.g., BA, AB, BS)  Occupational History    Comment: retired Runner, broadcasting/film/video  Tobacco Use  . Smoking status: Never Smoker  . Smokeless tobacco: Never Used  Vaping Use  . Vaping Use: Never used  Substance and Sexual Activity  . Alcohol use: Not Currently    Alcohol/week: 4.0 standard drinks    Types: 2 Glasses of wine, 2 Cans of beer per week    Comment: occasionally  . Drug use: No  . Sexual activity: Yes  Other Topics Concern  . Not on file  Social History Narrative   Diet:  Normal   Do you drink/eat things with caffeine?   Yes   Marital status:  Widow                            What year were you married? 1986 and 1960   Do you live in a house, apartment, assisted living, condo, trailer, etc)?  Apartment at [facility], independent living   Is it one or more stories? 1+   How many persons live in your home?  1   Do you have any pets in your home?  No   Current or past profession:  Runner, broadcasting/film/video, Environmental health practitioner at [employer] you exercise?   Yes                                                 Type & how often: Stretching,  Strengthening   Do you have a living will?  Yes   Do you have a DNR Form?   Do you have a POA/HPOA forms?  HPOA   Social Determinants of Health   Financial Resource Strain: Not on file  Food Insecurity: Not on file  Transportation Needs: Not on file  Physical Activity: Not on file  Stress: Not on file  Social Connections: Not on file    Tobacco Counseling Counseling given: Not Answered   Clinical Intake:                 Diabetic?no         Activities of Daily Living In your present state of health, do you have any difficulty performing the following activities: 09/05/2019  Hearing? N  Vision? N  Difficulty  concentrating or making decisions? N  Walking or climbing stairs? N  Dressing or bathing? N  Doing  errands, shopping? N  Some recent data might be hidden    Patient Care Team: Mahlon Gammon, MD as PCP - General (Internal Medicine) [name], MD as PCP - Cardiology (Cardiology) [name], [name], NP as Nurse Practitioner (Internal Medicine) Marzella Schlein., MD (Ophthalmology) Arminda Resides, MD as Consulting Physician (Dermatology) Arminda Resides, MD as Consulting Physician (Dermatology) Chevis Pretty, Dimas Aguas, MD as Consulting Physician (Gynecology)  Indicate any recent Medical Services you may have received from other than [facility] providers in the past year (date may be approximate).     Assessment:   This is a routine wellness examination for [place].  Hearing/Vision screen  Hearing Screening   125Hz  250Hz  500Hz  1000Hz  2000Hz  3000Hz  4000Hz  6000Hz  8000Hz   Right ear:           Left ear:           Comments: Patient has no hearing problems.  Vision Screening Comments: Patient wears glasses and still has problems with her vision. Patient had eye exam within last 6 months. Patient sees Dr.  Dietary issues and exercise activities discussed:    Goals   None    Depression Screen PHQ 2/9 Scores 04/05/2020 04/28/2019 09/03/2017  PHQ - 2 Score 2 0 4  PHQ- 9 Score 5 - 9    Fall Risk Fall Risk  04/05/2020 01/21/2020 11/12/2019 11/05/2019 10/09/2019  Falls in the past year? 1 0 0 0 1  Number falls in past yr: 1 0 0 0 0  Injury with Fall? 0 - - - 0    FALL RISK PREVENTION PERTAINING TO THE HOME:  Any stairs in or around the home? Yes  If so, are there any without handrails? No  Home free of loose throw rugs in walkways, pet beds, electrical cords, etc? Yes  Adequate lighting in your home to reduce risk of falls? Yes   ASSISTIVE DEVICES UTILIZED TO PREVENT FALLS:  Life alert? Yes  Use of a cane, walker or w/c? Yes  Grab bars in the bathroom? Yes  Shower chair or bench in shower?  Yes  Elevated toilet seat or a handicapped toilet? No   TIMED UP AND GO:  Was the test performed? No .    Cognitive Function: MMSE - Mini Mental State Exam 09/03/2017  Orientation to time 5  Orientation to Place 5  Registration 3  Attention/ Calculation 5  Recall 2  Language- name 2 objects 2  Language- repeat 1  Language- follow 3 step command 3  Language- read & follow direction 1  Write a sentence 1  Copy design 1  Total score 29     6CIT Screen 04/05/2020  What Year? 0 points  What month? 0 points  What time? 0 points  Count back from 20 0 points  Months in reverse 0 points  Repeat phrase 0 points  Total Score 0    Immunizations Immunization History  Administered Date(s) Administered  . Fluad Quad(high Dose 65+) 12/17/2018  . Influenza, High Dose Seasonal PF 12/08/2016  . Influenza-Unspecified 12/16/2019  . Moderna Sars-Covid-2 Vaccination 03/09/2019, 04/06/2019, 01/12/2020  . Pneumococcal Conjugate-13 06/12/2013  . Pneumococcal Polysaccharide-23 07/28/2002  . Tdap 07/27/2009  . Zoster Recombinat (Shingrix) 01/08/2017, 06/03/2017    TDAP status: Up to date  Flu Vaccine status: Up to date  Pneumococcal vaccine status: Up to date  Covid-19 vaccine status: Completed vaccines  Qualifies for Shingles Vaccine? Yes   Zostavax completed No   Shingrix Completed?: Yes  Screening Tests Health Maintenance  Topic Date Due  . TETANUS/TDAP  07/28/2019  . INFLUENZA VACCINE  Completed  . DEXA SCAN  Completed  . COVID-19 Vaccine  Completed  . PNA vac Low Risk Adult  Completed    Health Maintenance  Health Maintenance Due  Topic Date Due  . TETANUS/TDAP  07/28/2019    Colorectal cancer screening: No longer required.   Mammogram status: No longer required due to age.  Bone Density status: Ordered today. Pt provided with contact info and advised to call to schedule appt.  Lung Cancer Screening: (Low Dose CT Chest recommended if Age 62-80 years, 30  pack-year currently smoking OR have quit w/in 15years.) does not qualify.   Lung Cancer Screening Referral: na  Additional Screening:  Hepatitis C Screening: does not qualify; Completed   Vision Screening: Recommended annual ophthalmology exams for early detection of glaucoma and other disorders of the eye. Is the patient up to date with their annual eye exam?  Yes  Who is the provider or what is the name of the office in which the patient attends annual eye exams? Dr Lucretia Roers If pt is not established with a provider, would they like to be referred to a provider to establish care? No .   Dental Screening: Recommended annual dental exams for proper oral hygiene  Community Resource Referral / Chronic Care Management: CRR required this visit?  No   CCM required this visit?  No      Plan:     I have personally reviewed and noted the following in the patient's chart:   . Medical and social history . Use of alcohol, tobacco or illicit drugs  . Current medications and supplements . Functional ability and status . Nutritional status . Physical activity . Advanced directives . List of other physicians . Hospitalizations, surgeries, and ER visits in previous 12 months . Vitals . Screenings to include cognitive, depression, and falls . Referrals and appointments  In addition, I have reviewed and discussed with patient certain preventive protocols, quality metrics, and best practice recommendations. A written personalized care plan for preventive services as well as general preventive health recommendations were provided to patient.     Sharon Seller, NP   04/05/2020    Virtual Visit via Telephone Note  I connected with@ on 04/05/20 at 10:00 AM EST by telephone and verified that I am speaking with the correct person using two identifiers.  Location: Patient: home Provider: [facility]   I discussed the limitations, risks, security and privacy concerns of performing an  evaluation and management service by telephone and the availability of in person appointments. I also discussed with the patient that there may be a patient responsible charge related to this service. The patient expressed understanding and agreed to proceed.   I discussed the assessment and treatment plan with the patient. The patient was provided an opportunity to ask questions and all were answered. The patient agreed with the plan and demonstrated an understanding of the instructions.   The patient was advised to call back or seek an in-person evaluation if the symptoms worsen or if the condition fails to improve as anticipated.  I provided 18 minutes of non-face-to-face time during this encounter.  Janene Harvey. [address] printed and mailed

## 2020-04-05 NOTE — Telephone Encounter (Signed)
Adriana [name], alas are scheduled for a virtual visit with your provider today.    Just as we do with appointments in the office, we must obtain your consent to participate.  Your consent will be active for this visit and any virtual visit you may have with one of our providers in the next 365 days.    If you have a MyChart account, I can also send a copy of this consent to you electronically.  All virtual visits are billed to your insurance company just like a traditional visit in the office.  As this is a virtual visit, video technology does not allow for your provider to perform a traditional examination.  This may limit your provider's ability to fully assess your condition.  If your provider identifies any concerns that need to be evaluated in person or the need to arrange testing such as labs, EKG, etc, we will make arrangements to do so.    Although advances in technology are sophisticated, we cannot ensure that it will always work on either your end or our end.  If the connection with a video visit is poor, we may have to switch to a telephone visit.  With either a video or telephone visit, we are not always able to ensure that we have a secure connection.   I need to obtain your verbal consent now.   Are you willing to proceed with your visit today?   Adriana [name] has provided verbal consent on 04/05/2020 for a virtual visit (video or telephone).   Elveria Royals, CMA 04/05/2020  9:41 AM

## 2020-04-07 DIAGNOSIS — R278 Other lack of coordination: Secondary | ICD-10-CM | POA: Diagnosis not present

## 2020-04-07 DIAGNOSIS — R2681 Unsteadiness on feet: Secondary | ICD-10-CM | POA: Diagnosis not present

## 2020-04-07 DIAGNOSIS — M6281 Muscle weakness (generalized): Secondary | ICD-10-CM | POA: Diagnosis not present

## 2020-04-07 DIAGNOSIS — Z9181 History of falling: Secondary | ICD-10-CM | POA: Diagnosis not present

## 2020-04-08 ENCOUNTER — Other Ambulatory Visit: Payer: Self-pay | Admitting: Internal Medicine

## 2020-04-08 DIAGNOSIS — R278 Other lack of coordination: Secondary | ICD-10-CM | POA: Diagnosis not present

## 2020-04-08 DIAGNOSIS — M6281 Muscle weakness (generalized): Secondary | ICD-10-CM | POA: Diagnosis not present

## 2020-04-08 DIAGNOSIS — Z9181 History of falling: Secondary | ICD-10-CM | POA: Diagnosis not present

## 2020-04-08 DIAGNOSIS — R2681 Unsteadiness on feet: Secondary | ICD-10-CM | POA: Diagnosis not present

## 2020-04-09 ENCOUNTER — Other Ambulatory Visit: Payer: Self-pay | Admitting: Internal Medicine

## 2020-04-11 ENCOUNTER — Other Ambulatory Visit: Payer: Self-pay | Admitting: Internal Medicine

## 2020-04-12 DIAGNOSIS — M6281 Muscle weakness (generalized): Secondary | ICD-10-CM | POA: Diagnosis not present

## 2020-04-12 DIAGNOSIS — Z9181 History of falling: Secondary | ICD-10-CM | POA: Diagnosis not present

## 2020-04-12 DIAGNOSIS — R278 Other lack of coordination: Secondary | ICD-10-CM | POA: Diagnosis not present

## 2020-04-12 DIAGNOSIS — R2681 Unsteadiness on feet: Secondary | ICD-10-CM | POA: Diagnosis not present

## 2020-04-14 DIAGNOSIS — R2681 Unsteadiness on feet: Secondary | ICD-10-CM | POA: Diagnosis not present

## 2020-04-14 DIAGNOSIS — R278 Other lack of coordination: Secondary | ICD-10-CM | POA: Diagnosis not present

## 2020-04-14 DIAGNOSIS — Z9181 History of falling: Secondary | ICD-10-CM | POA: Diagnosis not present

## 2020-04-14 DIAGNOSIS — M6281 Muscle weakness (generalized): Secondary | ICD-10-CM | POA: Diagnosis not present

## 2020-04-15 DIAGNOSIS — M6281 Muscle weakness (generalized): Secondary | ICD-10-CM | POA: Diagnosis not present

## 2020-04-15 DIAGNOSIS — R2681 Unsteadiness on feet: Secondary | ICD-10-CM | POA: Diagnosis not present

## 2020-04-15 DIAGNOSIS — R278 Other lack of coordination: Secondary | ICD-10-CM | POA: Diagnosis not present

## 2020-04-15 DIAGNOSIS — Z9181 History of falling: Secondary | ICD-10-CM | POA: Diagnosis not present

## 2020-04-19 ENCOUNTER — Other Ambulatory Visit: Payer: Self-pay

## 2020-04-19 ENCOUNTER — Other Ambulatory Visit: Payer: Self-pay | Admitting: Internal Medicine

## 2020-04-19 DIAGNOSIS — R278 Other lack of coordination: Secondary | ICD-10-CM | POA: Diagnosis not present

## 2020-04-19 DIAGNOSIS — M6281 Muscle weakness (generalized): Secondary | ICD-10-CM | POA: Diagnosis not present

## 2020-04-19 DIAGNOSIS — Z9181 History of falling: Secondary | ICD-10-CM | POA: Diagnosis not present

## 2020-04-19 DIAGNOSIS — I10 Essential (primary) hypertension: Secondary | ICD-10-CM

## 2020-04-19 DIAGNOSIS — K219 Gastro-esophageal reflux disease without esophagitis: Secondary | ICD-10-CM | POA: Diagnosis not present

## 2020-04-19 DIAGNOSIS — R2681 Unsteadiness on feet: Secondary | ICD-10-CM | POA: Diagnosis not present

## 2020-04-19 LAB — CBC WITH DIFFERENTIAL/PLATELET
Absolute Monocytes: 576 cells/uL (ref 200–950)
Basophils Absolute: 60 cells/uL (ref 0–200)
Basophils Relative: 1 %
Eosinophils Absolute: 378 cells/uL (ref 15–500)
Eosinophils Relative: 6.3 %
HCT: 40.5 % (ref 35.0–45.0)
Hemoglobin: 13.7 g/dL (ref 11.7–15.5)
Lymphs Abs: 1260 cells/uL (ref 850–3900)
MCH: 31.3 pg (ref 27.0–33.0)
MCHC: 33.8 g/dL (ref 32.0–36.0)
MCV: 92.5 fL (ref 80.0–100.0)
MPV: 10.9 fL (ref 7.5–12.5)
Monocytes Relative: 9.6 %
Neutro Abs: 3726 cells/uL (ref 1500–7800)
Neutrophils Relative %: 62.1 %
Platelets: 223 10*3/uL (ref 140–400)
RBC: 4.38 10*6/uL (ref 3.80–5.10)
RDW: 11.6 % (ref 11.0–15.0)
Total Lymphocyte: 21 %
WBC: 6 10*3/uL (ref 3.8–10.8)

## 2020-04-19 LAB — COMPLETE METABOLIC PANEL WITH GFR
AG Ratio: 1.6 (calc) (ref 1.0–2.5)
ALT: 13 U/L (ref 6–29)
AST: 19 U/L (ref 10–35)
Albumin: 4.2 g/dL (ref 3.6–5.1)
Alkaline phosphatase (APISO): 60 U/L (ref 37–153)
BUN/Creatinine Ratio: 18 (calc) (ref 6–22)
BUN: 18 mg/dL (ref 7–25)
CO2: 31 mmol/L (ref 20–32)
Calcium: 9.4 mg/dL (ref 8.6–10.4)
Chloride: 105 mmol/L (ref 98–110)
Creat: 0.98 mg/dL — ABNORMAL HIGH (ref 0.60–0.88)
GFR, Est African American: 62 mL/min/{1.73_m2} (ref >=60)
GFR, Est Non African American: 53 mL/min/{1.73_m2} — ABNORMAL LOW (ref >=60)
Globulin: 2.6 g/dL (calc) (ref 1.9–3.7)
Glucose, Bld: 97 mg/dL (ref 65–99)
Potassium: 4.1 mmol/L (ref 3.5–5.3)
Sodium: 142 mmol/L (ref 135–146)
Total Bilirubin: 0.4 mg/dL (ref 0.2–1.2)
Total Protein: 6.8 g/dL (ref 6.1–8.1)

## 2020-04-19 NOTE — Telephone Encounter (Signed)
Patient is requesting #[phone] stated that she has an appointment this week and wants to discuss medication with Dr. Chales Abrahams.

## 2020-04-20 DIAGNOSIS — Z9181 History of falling: Secondary | ICD-10-CM | POA: Diagnosis not present

## 2020-04-20 DIAGNOSIS — M6281 Muscle weakness (generalized): Secondary | ICD-10-CM | POA: Diagnosis not present

## 2020-04-20 DIAGNOSIS — R278 Other lack of coordination: Secondary | ICD-10-CM | POA: Diagnosis not present

## 2020-04-20 DIAGNOSIS — R2681 Unsteadiness on feet: Secondary | ICD-10-CM | POA: Diagnosis not present

## 2020-04-21 DIAGNOSIS — M6281 Muscle weakness (generalized): Secondary | ICD-10-CM | POA: Diagnosis not present

## 2020-04-21 DIAGNOSIS — Z9181 History of falling: Secondary | ICD-10-CM | POA: Diagnosis not present

## 2020-04-21 DIAGNOSIS — R278 Other lack of coordination: Secondary | ICD-10-CM | POA: Diagnosis not present

## 2020-04-21 DIAGNOSIS — R2681 Unsteadiness on feet: Secondary | ICD-10-CM | POA: Diagnosis not present

## 2020-04-22 ENCOUNTER — Encounter: Payer: Self-pay | Admitting: Internal Medicine

## 2020-04-22 ENCOUNTER — Non-Acute Institutional Stay: Payer: Medicare PPO | Admitting: Internal Medicine

## 2020-04-22 ENCOUNTER — Other Ambulatory Visit: Payer: Self-pay

## 2020-04-22 VITALS — BP 156/88 | HR 57 | Temp 96.3°F | Ht 66.0 in | Wt 145.8 lb

## 2020-04-22 DIAGNOSIS — R42 Dizziness and giddiness: Secondary | ICD-10-CM | POA: Diagnosis not present

## 2020-04-22 DIAGNOSIS — M810 Age-related osteoporosis without current pathological fracture: Secondary | ICD-10-CM

## 2020-04-22 DIAGNOSIS — E7800 Pure hypercholesterolemia, unspecified: Secondary | ICD-10-CM | POA: Diagnosis not present

## 2020-04-22 DIAGNOSIS — I10 Essential (primary) hypertension: Secondary | ICD-10-CM

## 2020-04-22 DIAGNOSIS — Z8673 Personal history of transient ischemic attack (TIA), and cerebral infarction without residual deficits: Secondary | ICD-10-CM

## 2020-04-22 DIAGNOSIS — R059 Cough, unspecified: Secondary | ICD-10-CM

## 2020-04-22 DIAGNOSIS — R519 Headache, unspecified: Secondary | ICD-10-CM | POA: Diagnosis not present

## 2020-04-22 DIAGNOSIS — F418 Other specified anxiety disorders: Secondary | ICD-10-CM

## 2020-04-22 MED ORDER — MIRTAZAPINE 15 MG PO TABS
7.5000 mg | ORAL_TABLET | Freq: Every day | ORAL | 1 refills | Status: DC
Start: 2020-04-22 — End: 2020-07-21

## 2020-04-22 NOTE — Patient Instructions (Signed)
Cut your Losartan to half tablet once a day Check your BP and go back to full tablet if BP is more then 160/90 Wait 2 weeks before starting your Remeron

## 2020-04-24 NOTE — Progress Notes (Signed)
Location:  [facility] teacher of Service:  Clinic (12)  Provider:   Code Status:  Goals of Care:  Advanced Directives 04/05/2020  Does Patient Have a Medical Advance Directive? Yes  Type of Estate agent of [place];Living will  Does patient want to make changes to medical advance directive? No - Patient declined  Copy of Healthcare Power of Attorney in Chart? Yes - validated most recent copy scanned in chart (See row information)  Pre-existing out of facility DNR order (yellow form or pink MOST form) -     Chief Complaint  Patient presents with  . Medical Management of Chronic Issues    Patient returns to the clinic for follow up. CC dizziness, imbalance, depression, weakness in legs, sight, headaches    HPI: Patient is a 86 y.o. [name] seen today for medical management of chronic diseases.     Was admitted in the hospital from 6/30 through 7/3 with acute CVA and left periorbital hematoma.Thought to be due to Small vessel Disease PatientAlsohas past medical history of posterior CVA in left occipital cortexin 9/20. A detailed work-up including heart monitor was negative at that time. She also history has a history of diverticulitis, depression, asymptomatic moderate pericardial effusion.  Had number of Concerns  Dizziness/unstable gait Patient read that the Cozaar can cause dizziness.  Wants to try lowering the dose to see if it helps her dizziness.  Her dizziness seems more due to vision impairment since her stroke and she is working with therapy for gait Depression Continues to be active problem.  Does not want me to increase the dose of Zoloft.  Wants to know add  another medication Weakness in lower extremities Working with therapy Peripheral  vision loss Per ophthalmology they cannot do more for her Mild headache She states sometimes feels mild headache left temporal region behind her left eye.  No other symptoms related.  It does  not last more than few min.  [place] with [place] walk[place]  Past Medical History:  Diagnosis Date  . Anginal pain (HCC) 08/24/2010   Echo-EF =>55%,LV normal  . Arthritis   . Cataract 12/2006   Both eyes  Epes MD  . Chest pain, unspecified 09/04/2007   Lexiscan-- EF 72%; LV normal  . Constipation    Takes flax seed and honey .  Smooth Move tea.  . Depression   . Diverticulitis   . Head injury, closed    with fall  . Hypertension   . Mitral valve prolapse   . Osteopenia   . Stroke [facility]) 09/02/2019    Past Surgical History:  Procedure Laterality Date  . ANTERIOR CERVICAL DECOMP/DISCECTOMY FUSION N/A 10/23/2012   Procedure: Cervical Five to Cervical Six, Cervical Six to Cervical Seven anterior cervical decompression with fusion plating and bonegraft;  Surgeon: Hewitt Shorts, MD;  Location: [facility];  Service: Neurosurgery;  Laterality: N/A;  ANTERIOR CERVICAL DECOMPRESSION/DISCECTOMY FUSION 2 LEVELS  . BREAST CYST ASPIRATION  1990   benign  . CARDIAC CATHETERIZATION  08/01/2010   normal coronaries  . COLONOSCOPY  approx 2011  . EYE SURGERY Bilateral 2011  . intestinal blockage surgery  Feb. 2012   "kink in small intestine" per pt    Allergies  Allergen Reactions  . Contrast Media [Iodinated Diagnostic Agents] Hives    Hives during IVP at urology office '02, requires 13 hr prep///a.calhoun  Patient came thru ER and had a 1 hour pre-medication and did fine  .  Oxycodone Other (See Comments)    Decreased appetite.  Sandrea Hammond [Ibandronic Acid]     Nausea, vomiting, weakness  . Codeine Diarrhea and Nausea And Vomiting  . Darvocet [Propoxyphene N-Acetaminophen] Diarrhea and Nausea And Vomiting  . Erythromycin Nausea And Vomiting  . Other Other (See Comments)    Outpatient Encounter Medications as of 04/22/2020  Medication Sig  . alendronate (FOSAMAX) 70 MG tablet TAKE 1 TAB ONCE A WEEK, AT LEAST 30 MIN BEFORE 1ST FOOD.DO NOT LIE DOWN FOR 30 MIN AFTER TAKING.  Marland Kitchen  atorvastatin (LIPITOR) 20 MG tablet Take 1 tablet (20 mg total) by mouth in the morning.  . Calcium Carb-Cholecalciferol (CALCIUM 600+D3 PO) Take 600 mg by mouth daily.   . clopidogrel (PLAVIX) 75 MG tablet Take 1 tablet (75 mg total) by mouth daily.  Marland Kitchen docusate sodium (COLACE) 100 MG capsule Take 100 mg by mouth at bedtime.  Marland Kitchen losartan (COZAAR) 50 MG tablet TAKE 1 TABLET BY MOUTH DAILY.  . metoprolol tartrate (LOPRESSOR) 25 MG tablet TAKE 1 TABLET BY MOUTH TWICE DAILY.  . mirtazapine (REMERON) 15 MG tablet Take 0.5 tablets (7.5 mg total) by mouth at bedtime. Start 3/week at night for 2 weeks. Then go to every day at night  . pantoprazole (PROTONIX) 40 MG tablet Take 1 tablet (40 mg total) by mouth daily.  . sertraline (ZOLOFT) 50 MG tablet TAKE 1 & 1/2 TABLETS A DAY.   No facility-administered encounter medications on file as of 04/22/2020.    Review of Systems:  Review of Systems  Health Maintenance  Topic Date Due  . TETANUS/TDAP  07/28/2019  . INFLUENZA VACCINE  Completed  . DEXA SCAN  Completed  . COVID-19 Vaccine  Completed  . PNA vac Low Risk Adult  Completed    Physical Exam: Vitals:   04/22/20 1105  BP: (!) 156/88  Pulse: (!) 57  Temp: (!) 96.3 F (35.7 C)  Weight: 145 lb 12.8 oz (66.1 kg)  Height: 5\' 6"  (1.676 m)   Body mass index is 23.53 kg/m. Physical Exam  Labs reviewed: Basic Metabolic Panel: Recent Labs    06/30/19 0715 09/02/19 1130 10/29/19 0700 11/16/19 0949 04/19/20 0710  NA 141   < > 139 140 142  K 3.7   < > 4.2 3.7 4.1  CL 106   < > 103 104 105  CO2 29   < > 28 27 31   GLUCOSE 94   < > 99 93 97  BUN 11   < > 17 16 18   CREATININE 0.72   < > 0.87 0.90* 0.98*  CALCIUM 9.9   < > 9.4 9.0 9.4  TSH 4.05  --   --   --   --    < > = values in this interval not displayed.   Liver Function Tests: Recent Labs    09/02/19 1130 10/29/19 0700 11/16/19 0949 04/19/20 0710  AST 30 28 28 19   ALT 15 23 21 13   ALKPHOS 52  --   --   --   BILITOT 1.0  0.5 0.3 0.4  PROT 6.9 6.5 6.3 6.8  ALBUMIN 4.0  --   --   --    Recent Labs    05/12/19 0745 06/30/19 0715  LIPASE 81* 25   No results for input(s): AMMONIA in the last 8760 hours. CBC: Recent Labs    10/29/19 0700 11/16/19 0949 04/19/20 0710  WBC 5.2 5.7 6.0  NEUTROABS 2,985 3,181 3,726  HGB 13.2 12.9  13.7  HCT 39.8 39.1 40.5  MCV 92.3 91.8 92.5  PLT 217 257 223   Lipid Panel: Recent Labs    05/12/19 0745 06/30/19 0715 09/03/19 0331  CHOL 84 129 137  HDL 41* 46* 50  LDLCALC 29 69 73  TRIG 67 65 71  CHOLHDL 2.0 2.8 2.7   Lab Results  Component Value Date   HGBA1C 5.3 09/03/2019    Procedures since last visit: No results found.  Assessment/Plan 1. Dizziness Not sure if related to Postural Hy[potension or Vision issues Will decrase her Cozaar to 25 mg If BP goes more then 160/90 she will go back to 50 mg And will let us know  2. Primary hypertension As above. Was mildily high today but she says it runs lower at home Decrase her Cozaar to 25 mg and monitor BP closely  3. History of CVA (cerebrovascular accident) On Plavix and Statin  4. Depression with anxiety Will start on Remeron 7.5 mg 3/week and then go to every night Continue same dose of Zolft Does nto want zoloft dose to change  5. Nonintractable headache, unspecified chronicity pattern, unspecified headache type Not sure etiology yet She will record other symptoms if it happens agin  6. Cough Resolved after she has been off Lisinopril  7. Osteoporosis without current pathological fracture, unspecified osteoporosis type DEXA in 2019 Schedule for  Repeat Imaging in few months  8. Pure hypercholesterolemia LDL less then 100    Labs/tests ordered:  * No order type specified * Next appt:  07/21/2020

## 2020-04-26 DIAGNOSIS — R278 Other lack of coordination: Secondary | ICD-10-CM | POA: Diagnosis not present

## 2020-04-26 DIAGNOSIS — R2681 Unsteadiness on feet: Secondary | ICD-10-CM | POA: Diagnosis not present

## 2020-04-26 DIAGNOSIS — Z9181 History of falling: Secondary | ICD-10-CM | POA: Diagnosis not present

## 2020-04-26 DIAGNOSIS — M6281 Muscle weakness (generalized): Secondary | ICD-10-CM | POA: Diagnosis not present

## 2020-04-27 DIAGNOSIS — R2681 Unsteadiness on feet: Secondary | ICD-10-CM | POA: Diagnosis not present

## 2020-04-27 DIAGNOSIS — M6281 Muscle weakness (generalized): Secondary | ICD-10-CM | POA: Diagnosis not present

## 2020-04-27 DIAGNOSIS — Z9181 History of falling: Secondary | ICD-10-CM | POA: Diagnosis not present

## 2020-04-27 DIAGNOSIS — R278 Other lack of coordination: Secondary | ICD-10-CM | POA: Diagnosis not present

## 2020-04-29 DIAGNOSIS — M6281 Muscle weakness (generalized): Secondary | ICD-10-CM | POA: Diagnosis not present

## 2020-04-29 DIAGNOSIS — R278 Other lack of coordination: Secondary | ICD-10-CM | POA: Diagnosis not present

## 2020-04-29 DIAGNOSIS — Z9181 History of falling: Secondary | ICD-10-CM | POA: Diagnosis not present

## 2020-04-29 DIAGNOSIS — R2681 Unsteadiness on feet: Secondary | ICD-10-CM | POA: Diagnosis not present

## 2020-05-02 DIAGNOSIS — M6281 Muscle weakness (generalized): Secondary | ICD-10-CM | POA: Diagnosis not present

## 2020-05-02 DIAGNOSIS — Z9181 History of falling: Secondary | ICD-10-CM | POA: Diagnosis not present

## 2020-05-02 DIAGNOSIS — R2681 Unsteadiness on feet: Secondary | ICD-10-CM | POA: Diagnosis not present

## 2020-05-02 DIAGNOSIS — R278 Other lack of coordination: Secondary | ICD-10-CM | POA: Diagnosis not present

## 2020-05-03 ENCOUNTER — Other Ambulatory Visit: Payer: Self-pay | Admitting: Internal Medicine

## 2020-05-10 ENCOUNTER — Other Ambulatory Visit: Payer: Self-pay | Admitting: Internal Medicine

## 2020-05-10 DIAGNOSIS — M810 Age-related osteoporosis without current pathological fracture: Secondary | ICD-10-CM

## 2020-05-10 NOTE — Telephone Encounter (Signed)
Pharmacy requested refill Pended Rx and sent to [facility] for approval (Dr. Chales Abrahams out of office) Due to HIGH ALERT Warning.

## 2020-05-22 ENCOUNTER — Other Ambulatory Visit: Payer: Self-pay | Admitting: Internal Medicine

## 2020-05-23 ENCOUNTER — Telehealth: Payer: Self-pay | Admitting: *Deleted

## 2020-05-23 NOTE — Telephone Encounter (Signed)
Patient called and stated that she received her Losartan Refill today and it is different from what she has been taking.  Patient has been currently taking Losartan 50mg  once daily. The medication in her Current medication list if for Losartan 50mg  1/2 daily. (it was changed at her last appointment due to Dizziness)  Patient stated that she does not remember being told to decrease the medication so she didn't and has been taking the original dosage and her blood pressure's have been good.   Today she had a tooth extraction and they would only do 1 tooth extraction because her blood pressure was high with the bottom number being over 100.   Patient feels she needs to continue the 50mg . Stated that her Dizziness is ok right now and she feels it is coming from her previous stroke she had.   Patient is requesting to stay on the current dose of Losartan 50mg  she is taking but stated that she will do as you advise.   Please Advise.  (Forwarded to [facility] Due to Dr. out of Office.)   OV NOTE Dated 04/15/2020: Assessment/Plan 1. Dizziness Not sure if related to Postural Hy[potension or Vision issues Will decrase her Cozaar to 25 mg If BP goes more then 160/90 she will go back to 50 mg And will let know

## 2020-05-23 NOTE — Telephone Encounter (Signed)
Pharmacy requested refill.     Assessment/Plan 1. Dizziness Not sure if related to Postural Hy[potension or Vision issues Will decrase her Cozaar to 25 mg If BP goes more then 160/90 she will go back to 50 mg And will let us know

## 2020-05-23 NOTE — Telephone Encounter (Signed)
[name], [name], NP  You 7 minutes ago (4:23 PM)   Okay to resume Losartan 50mg  qd, close monitor Bp. Thanks.    Message text        Tried calling patient but phone just rings. Will try again later.

## 2020-05-24 MED ORDER — LOSARTAN POTASSIUM 50 MG PO TABS: 50.0000 mg | ORAL_TABLET | Freq: Every day | ORAL | 1 refills | Status: DC

## 2020-05-24 NOTE — Telephone Encounter (Signed)
Patient notified and agreed. Medication list updated.  

## 2020-05-31 ENCOUNTER — Other Ambulatory Visit: Payer: Self-pay | Admitting: Internal Medicine

## 2020-06-09 ENCOUNTER — Other Ambulatory Visit: Payer: Self-pay | Admitting: Internal Medicine

## 2020-07-06 ENCOUNTER — Non-Acute Institutional Stay (INDEPENDENT_AMBULATORY_CARE_PROVIDER_SITE_OTHER): Payer: Medicare PPO | Admitting: Family Medicine

## 2020-07-06 ENCOUNTER — Encounter: Payer: Self-pay | Admitting: Family Medicine

## 2020-07-06 ENCOUNTER — Other Ambulatory Visit: Payer: Self-pay

## 2020-07-06 VITALS — BP 126/68 | HR 56 | Temp 97.6°F | Resp 18 | Ht 66.0 in | Wt 140.6 lb

## 2020-07-06 DIAGNOSIS — F418 Other specified anxiety disorders: Secondary | ICD-10-CM

## 2020-07-06 DIAGNOSIS — I10 Essential (primary) hypertension: Secondary | ICD-10-CM

## 2020-07-06 DIAGNOSIS — N3942 Incontinence without sensory awareness: Secondary | ICD-10-CM

## 2020-07-06 DIAGNOSIS — I63331 Cerebral infarction due to thrombosis of right posterior cerebral artery: Secondary | ICD-10-CM | POA: Diagnosis not present

## 2020-07-06 NOTE — Progress Notes (Signed)
Provider:  Jacalyn Lefevre, MD  Careteam: Patient Care Team: [name], [name], NP as PCP - General (Internal Medicine) [name], MD as PCP - Cardiology (Cardiology) [name], [name], NP as Nurse Practitioner (Internal Medicine) Marzella Schlein., MD (Ophthalmology) Arminda Resides, MD as Consulting Physician (Dermatology) Arminda Resides, MD as Consulting Physician (Dermatology) Teodora Medici, MD as Consulting Physician (Gynecology)  PLACE OF SERVICE:  [facility]  Advanced Directive information    Allergies  Allergen Reactions  . Contrast Media [Iodinated Diagnostic Agents] Hives    Hives during IVP at urology office '02, requires 13 hr prep///a.calhoun  Patient came thru ER and had a 1 hour pre-medication and did fine  . Oxycodone Other (See Comments)    Decreased appetite.  Sandrea Hammond [Ibandronic Acid]     Nausea, vomiting, weakness  . Codeine Diarrhea and Nausea And Vomiting  . Darvocet [Propoxyphene N-Acetaminophen] Diarrhea and Nausea And Vomiting  . Erythromycin Nausea And Vomiting  . Other Other (See Comments)    No chief complaint on file.    HPI: Patient is a 86 y.o. female she presents with many of the same concerns that she had on her last visit which include gait disturbance balance vision issues.  She had a fall yesterday and has some soreness in her legs and bottom.  She also has questions about increased gas, pain in her thumbs. And just talking about her thumbs she uses a walker extensively now and her grip on the walker I suspect is causing some pressure neuropathy with pain in her thumbs.  I do not know that there is a solution to this unless we are successful in getting her a motorized scooter which she is very much interested in, especially when she has to move distances more than just around her apartment.  Review of Systems:  Review of Systems  Constitutional: Negative.   HENT: Negative.   Eyes: Positive for blurred vision.  Respiratory: Negative.    Cardiovascular: Negative.   Gastrointestinal: Positive for constipation.  Genitourinary: Negative.   Musculoskeletal: Positive for falls.  Skin: Negative.   Neurological: Positive for dizziness.  Endo/Heme/Allergies: Negative.   Psychiatric/Behavioral: Positive for depression.    Past Medical History:  Diagnosis Date  . Anginal pain (HCC) 08/24/2010   Echo-EF =>55%,LV normal  . Arthritis   . Cataract 12/2006   Both eyes  Epes MD  . Chest pain, unspecified 09/04/2007   Lexiscan-- EF 72%; LV normal  . Constipation    Takes flax seed and honey .  Smooth Move tea.  . Depression   . Diverticulitis   . Head injury, closed    with fall  . Hypertension   . Mitral valve prolapse   . Osteopenia   . Stroke [facility]) 09/02/2019   Past Surgical History:  Procedure Laterality Date  . ANTERIOR CERVICAL DECOMP/DISCECTOMY FUSION N/A 10/23/2012   Procedure: Cervical Five to Cervical Six, Cervical Six to Cervical Seven anterior cervical decompression with fusion plating and bonegraft;  Surgeon: Hewitt Shorts, MD;  Location: [facility];  Service: Neurosurgery;  Laterality: N/A;  ANTERIOR CERVICAL DECOMPRESSION/DISCECTOMY FUSION 2 LEVELS  . BREAST CYST ASPIRATION  1990   benign  . CARDIAC CATHETERIZATION  08/01/2010   normal coronaries  . COLONOSCOPY  approx 2011  . EYE SURGERY Bilateral 2011  . intestinal blockage surgery  Feb. 2012   "kink in small intestine" per pt   Social History:   reports that she has never smoked. She has never  used smokeless tobacco. She reports previous alcohol use of about 4.0 standard drinks of alcohol per week. She reports that she does not use drugs.  Family History  Problem Relation Age of Onset  . Hypertension Mother   . Transient ischemic attack Mother   . Parkinson's disease Mother   . Dementia Mother   . CVA Mother   . Diabetes Father   . Coronary artery disease Father   . Heart failure Father   . Cancer Father        prostate  . Depression  Father   . Congestive Heart Failure Father   . Prostate cancer Brother   . Diabetes Brother   . Prostate cancer Paternal Grandmother     Medications: Patient's Medications  New Prescriptions   No medications on file  Previous Medications   ALENDRONATE (FOSAMAX) 70 MG TABLET    TAKE 1 TAB ONCE A WEEK, AT LEAST 30 MIN BEFORE 1ST FOOD.DO NOT LIE DOWN FOR 30 MIN AFTER TAKING.   ATORVASTATIN (LIPITOR) 20 MG TABLET    TAKE 1 TABLET BY MOUTH DAILY.   CALCIUM CARB-CHOLECALCIFEROL (CALCIUM 600+D3 PO)    Take 600 mg by mouth daily.    CLOPIDOGREL (PLAVIX) 75 MG TABLET    TAKE 1 TABLET ONCE DAILY.   DOCUSATE SODIUM (COLACE) 100 MG CAPSULE    Take 100 mg by mouth at bedtime.   LOSARTAN (COZAAR) 50 MG TABLET    Take 1 tablet (50 mg total) by mouth daily.   METOPROLOL TARTRATE (LOPRESSOR) 25 MG TABLET    TAKE 1 TABLET BY MOUTH TWICE DAILY.   MIRTAZAPINE (REMERON) 15 MG TABLET    Take 0.5 tablets (7.5 mg total) by mouth at bedtime. Start 3/week at night for 2 weeks. Then go to every day at night   PANTOPRAZOLE (PROTONIX) 40 MG TABLET    TAKE 1 TABLET ONCE DAILY.   SERTRALINE (ZOLOFT) 50 MG TABLET    TAKE 1 & 1/2 TABLETS A DAY.  Modified Medications   No medications on file  Discontinued Medications   No medications on file    Physical Exam:  There were no vitals filed for this visit. There is no height or weight on file to calculate BMI. Wt Readings from Last 3 Encounters:  04/22/20 145 lb 12.8 oz (66.1 kg)  01/27/20 144 lb (65.3 kg)  01/21/20 145 lb 9.6 oz (66 kg)    Physical Exam Vitals and nursing note reviewed.  Constitutional:      Appearance: Normal appearance.  HENT:     Head: Normocephalic.  Cardiovascular:     Rate and Rhythm: Normal rate and regular rhythm.  Pulmonary:     Effort: Pulmonary effort is normal.     Breath sounds: Normal breath sounds.  Abdominal:     General: Abdomen is flat. Bowel sounds are normal.  Musculoskeletal:     Cervical back: Normal range of  motion.     Comments: Uses walker for ambulation  Neurological:     General: No focal deficit present.     Mental Status: She is alert and oriented to person, place, and time.  Psychiatric:        Mood and Affect: Mood normal.        Behavior: Behavior normal.     Labs reviewed: Basic Metabolic Panel: Recent Labs    10/29/19 0700 11/16/19 0949 04/19/20 0710  NA 139 140 142  K 4.2 3.7 4.1  CL 103 104 105  CO2 28 27  31  GLUCOSE 99 93 97  BUN 17 16 18   CREATININE 0.87 0.90* 0.98*  CALCIUM 9.4 9.0 9.4   Liver Function Tests: Recent Labs    09/02/19 1130 10/29/19 0700 11/16/19 0949 04/19/20 0710  AST 30 28 28 19   ALT 15 23 21 13   ALKPHOS 52  --   --   --   BILITOT 1.0 0.5 0.3 0.4  PROT 6.9 6.5 6.3 6.8  ALBUMIN 4.0  --   --   --    No results for input(s): LIPASE, AMYLASE in the last 8760 hours. No results for input(s): AMMONIA in the last 8760 hours. CBC: Recent Labs    10/29/19 0700 11/16/19 0949 04/19/20 0710  WBC 5.2 5.7 6.0  NEUTROABS 2,985 3,181 3,726  HGB 13.2 12.9 13.7  HCT 39.8 39.1 40.5  MCV 92.3 91.8 92.5  PLT 217 257 223   Lipid Panel: Recent Labs    09/03/19 0331  CHOL 137  HDL 50  LDLCALC 73  TRIG 71  CHOLHDL 2.7   TSH: No results for input(s): TSH in the last 8760 hours. A1C: Lab Results  Component Value Date   HGBA1C 5.3 09/03/2019     Assessment/Plan  1. Primary hypertension Blood pressure good today at 126/68.  She continues on metoprolol and losartan.  2. Cerebrovascular accident (CVA) due to thrombosis of right posterior cerebral artery (HCC) I think the stroke as well as a stroke prior have more to do with her balance weakness and visual disturbance.  We spent some time talking about possibility of getting her a motorized scooter.  We may be asked to complete some forms and involved physical therapy as well.  3. Depression with anxiety She never took mirtazapine that was prescribed at her last visit.  She does continue  on Zoloft 50 mg.  4. Urinary incontinence without sensory awareness Incontinence is becoming more of a problem.  We discussed general methods of not drinking liquids after supper time emptying bladder frequently throughout the day may be doubling up on the depends or pads.  I am not in favor of using a bladder control medicine due to side effects many of which she already has due to her other morbidities.   04/21/20, MD [facility] [phone]

## 2020-07-07 ENCOUNTER — Telehealth: Payer: Self-pay | Admitting: Cardiology

## 2020-07-07 ENCOUNTER — Telehealth: Payer: Self-pay | Admitting: *Deleted

## 2020-07-07 MED ORDER — CETIRIZINE HCL 5 MG PO TABS: 5.0000 mg | ORAL_TABLET | Freq: Every day | ORAL | 0 refills | Status: DC

## 2020-07-07 NOTE — Telephone Encounter (Signed)
Sharon Seller, NP  [name], [name], NP 15 minutes ago (11:59 AM)   Fwding to [facility] since she is her PCP   Message text

## 2020-07-07 NOTE — Telephone Encounter (Signed)
Patient stated that she doesn't what to follow Dr. Cristal Deer and she will like to stay at [facility] office. She will pick a Dr later

## 2020-07-07 NOTE — Telephone Encounter (Signed)
[name], [name], NP  You 10 minutes ago (12:19 PM)   It was not mentioned about antihistamine in Dr. Rondel Baton progress note. The patient Adriana [name] have to check back with Dr. Hyacinth Meeker if it is okay with her, otherwise I can see her if you can work her in. Thanks.    Message text

## 2020-07-07 NOTE — Telephone Encounter (Signed)
Patient called and stated that she saw Dr. Hyacinth Meeker yesterday and he was going to call in an Antihistamine to [facility]. Stated that she called the pharmacy and they haven't received anything.   Please Advise. (sent to [facility] due to Dr. Hyacinth Meeker out of office)

## 2020-07-07 NOTE — Telephone Encounter (Signed)
I called and spoke with Dr. Hyacinth Meeker. He stated that he was going to call in Zyrtec once daily for patient. Stated he knows it is OTC but patient requested to be sent to pharmacy.   Updated medication list and sent to pharmacy.

## 2020-07-21 ENCOUNTER — Non-Acute Institutional Stay: Payer: Medicare PPO | Admitting: Nurse Practitioner

## 2020-07-21 ENCOUNTER — Other Ambulatory Visit: Payer: Self-pay

## 2020-07-21 ENCOUNTER — Encounter: Payer: Self-pay | Admitting: Nurse Practitioner

## 2020-07-21 DIAGNOSIS — I63331 Cerebral infarction due to thrombosis of right posterior cerebral artery: Secondary | ICD-10-CM | POA: Diagnosis not present

## 2020-07-21 DIAGNOSIS — R059 Cough, unspecified: Secondary | ICD-10-CM

## 2020-07-21 DIAGNOSIS — E785 Hyperlipidemia, unspecified: Secondary | ICD-10-CM | POA: Insufficient documentation

## 2020-07-21 DIAGNOSIS — F418 Other specified anxiety disorders: Secondary | ICD-10-CM | POA: Diagnosis not present

## 2020-07-21 DIAGNOSIS — K219 Gastro-esophageal reflux disease without esophagitis: Secondary | ICD-10-CM

## 2020-07-21 DIAGNOSIS — M810 Age-related osteoporosis without current pathological fracture: Secondary | ICD-10-CM | POA: Diagnosis not present

## 2020-07-21 DIAGNOSIS — I10 Essential (primary) hypertension: Secondary | ICD-10-CM

## 2020-07-21 DIAGNOSIS — R269 Unspecified abnormalities of gait and mobility: Secondary | ICD-10-CM | POA: Diagnosis not present

## 2020-07-21 DIAGNOSIS — I3139 Other pericardial effusion (noninflammatory): Secondary | ICD-10-CM

## 2020-07-21 DIAGNOSIS — I313 Pericardial effusion (noninflammatory): Secondary | ICD-10-CM

## 2020-07-21 DIAGNOSIS — N3942 Incontinence without sensory awareness: Secondary | ICD-10-CM

## 2020-07-21 DIAGNOSIS — K5901 Slow transit constipation: Secondary | ICD-10-CM | POA: Diagnosis not present

## 2020-07-21 DIAGNOSIS — M159 Polyosteoarthritis, unspecified: Secondary | ICD-10-CM

## 2020-07-21 MED ORDER — SERTRALINE HCL 50 MG PO TABS: 25.0000 mg | ORAL_TABLET | Freq: Every day | ORAL | 5 refills | Status: DC

## 2020-07-21 NOTE — Assessment & Plan Note (Signed)
takes Fosamax, Ca, Vit D, due DEXA

## 2020-07-21 NOTE — Assessment & Plan Note (Signed)
Getting worse gradually.

## 2020-07-21 NOTE — Assessment & Plan Note (Signed)
Blood pressure is controlled, switched to Losartan 12/18/19 due to cough, takes  Metoprolol.

## 2020-07-21 NOTE — Assessment & Plan Note (Signed)
Resolved

## 2020-07-21 NOTE — Assessment & Plan Note (Addendum)
Chronic occasional dry coughfor about a year, left side throat tickles sometimes during eating, then cough,  Better after dc Lisinopril, then seasonal allergy with running nose, better on Zyrtec,  then started coughing with some clear phlegm ? Resultant ofCVA.. ST: no apparent swallowing problem.May be ENT in the future.no SOB, chest pain, sore throat, or O2 desaturation. May consider CXR if no better.

## 2020-07-21 NOTE — Assessment & Plan Note (Addendum)
Sleeps well at night, takes 2 naps a day, reduce Sertraline to 25mg  qd

## 2020-07-21 NOTE — Progress Notes (Signed)
Location:   clinic [facility]   Place of Service:  Clinic (12) Provider: Arna Snipe Liyla Radliff NP  Joanne Salah X, NP  Patient Care Team: Osvaldo Lamping X, NP as PCP - General (Internal Medicine) Little Ishikawa, MD as PCP - Cardiology (Cardiology) Lameisha Schuenemann X, NP as Nurse Practitioner (Internal Medicine) Marzella Schlein., MD (Ophthalmology) Arminda Resides, MD as Consulting Physician (Dermatology) Arminda Resides, MD as Consulting Physician (Dermatology) Teodora Medici, MD as Consulting Physician (Gynecology)  Extended Emergency Contact Information Primary Emergency Contact: Aline Brochure [place] Phone: [phone] Relation: Daughter Secondary Emergency Contact: Black,Joel [place] Phone: [phone] Relation: Son  Code Status: DNR Goals of care: Advanced Directive information Advanced Directives 07/21/2020  Does Patient Have a Medical Advance Directive? Yes  Type of Advance Directive Living will;Healthcare Power of Attorney  Does patient want to make changes to medical advance directive? No - Patient declined  Copy of Healthcare Power of Attorney in Chart? Yes - validated most recent copy scanned in chart (See row information)  Pre-existing out of facility DNR order (yellow form or pink MOST form) -     Chief Complaint  Patient presents with  . Medical Management of Chronic Issues    3 month follow up. Patient states she is continuing to have issues with balance and depression. Patient is interested in getting a power chair. Patient also states she had had a persistent productive cough for the last 3 weeks, no fever or congestion.  Marland Kitchen Health Maintenance    Discuss the need for TD/ Tdap vaccine.    HPI:  Pt is a 86 y.o. female seen today for managing chronic medical conditions.   HTN,switched to Losartan 12/18/19 due to cough, takes  Metoprolol. Constipation, takes Colace qd.              GERD, takes Pantoprazole, bloated at times. 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 Takes Plavix. Atorvastatin.affected gait and vision issues, feels weak left leg.   Gait abnormality, fell 07/05/20, better soreness in legs and bottom            Chronic occasional dry coughfor about a year, left side throat tickles sometimes during eating, then cough, ? Resultant ofCVA.. ST: no apparent swallowing problem.May be ENT in the future.             OP, takes Fosamax, Ca, Vit D, due DEXA Depression, takes Sertraline.  Pericardial effusion, resolved Hyperlipidemia, takes Atorvastatin, LDL 73 09/03/19  OA pain, in thumbs ? Using walker/pressure  Urinary incontinence, progressing                 Past Medical History:  Diagnosis Date  . Anginal pain (HCC) 08/24/2010   Echo-EF =>55%,LV normal  . Arthritis   . Cataract 12/2006   Both eyes  Epes MD  . Chest pain, unspecified 09/04/2007   Lexiscan-- EF 72%; LV normal  . Constipation    Takes flax seed and honey .  Smooth Move tea.  . Depression   . Diverticulitis   . Head injury, closed    with fall  . Hypertension   . Mitral valve prolapse   . Osteopenia   . Stroke [facility]) 09/02/2019   Past Surgical History:  Procedure Laterality Date  . ANTERIOR CERVICAL DECOMP/DISCECTOMY FUSION N/A 10/23/2012   Procedure: Cervical Five to Cervical Six, Cervical Six to Cervical Seven anterior cervical decompression with fusion plating and  bonegraft;  Surgeon: Hewitt Shortsobert W Nudelman, MD;  Location: [facility];  Service: Neurosurgery;  Laterality: N/A;  ANTERIOR CERVICAL DECOMPRESSION/DISCECTOMY FUSION 2 LEVELS  . BREAST CYST ASPIRATION  1990   benign  . CARDIAC CATHETERIZATION  08/01/2010   normal coronaries  . COLONOSCOPY  approx 2011  . EYE SURGERY Bilateral 2011  . intestinal blockage surgery  Feb. 2012   "kink in small intestine" per pt    Allergies   Allergen Reactions  . Contrast Media [Iodinated Diagnostic Agents] Hives    Hives during IVP at urology office '02, requires 13 hr prep///a.calhoun  Patient came thru ER and had a 1 hour pre-medication and did fine  . Oxycodone Other (See Comments)    Decreased appetite.  Sandrea Hammond. Boniva [Ibandronic Acid]     Nausea, vomiting, weakness  . Codeine Diarrhea and Nausea And Vomiting  . Darvocet [Propoxyphene N-Acetaminophen] Diarrhea and Nausea And Vomiting  . Erythromycin Nausea And Vomiting  . Other Other (See Comments)    Allergies as of 07/21/2020      Reactions   Contrast Media [iodinated Diagnostic Agents] Hives   Hives during IVP at urology office '02, requires 13 hr prep///a.calhoun Patient came thru ER and had a 1 hour pre-medication and did fine   Oxycodone Other (See Comments)   Decreased appetite.   Boniva [ibandronic Acid]    Nausea, vomiting, weakness   Codeine Diarrhea, Nausea And Vomiting   Darvocet [propoxyphene N-acetaminophen] Diarrhea, Nausea And Vomiting   Erythromycin Nausea And Vomiting   Other Other (See Comments)      Medication List       Accurate as of Jul 21, 2020 11:59 PM. If you have any questions, ask your nurse or doctor.        STOP taking these medications   mirtazapine 15 MG tablet Commonly known as: Remeron Stopped by: Gregor Dershem X Mahlet Jergens, NP     TAKE these medications   alendronate 70 MG tablet Commonly known as: FOSAMAX TAKE 1 TAB ONCE A WEEK, AT LEAST 30 MIN BEFORE 1ST FOOD.DO NOT LIE DOWN FOR 30 MIN AFTER TAKING.   atorvastatin 20 MG tablet Commonly known as: LIPITOR TAKE 1 TABLET BY MOUTH DAILY.   CALCIUM 600+D3 PO Take 600 mg by mouth daily.   cetirizine 5 MG tablet Commonly known as: ZYRTEC Take 1 tablet (5 mg total) by mouth daily.   clopidogrel 75 MG tablet Commonly known as: PLAVIX TAKE 1 TABLET ONCE DAILY.   docusate sodium 100 MG capsule Commonly known as: COLACE Take 100 mg by mouth at bedtime.   losartan 50 MG  tablet Commonly known as: COZAAR Take 1 tablet (50 mg total) by mouth daily.   metoprolol tartrate 25 MG tablet Commonly known as: LOPRESSOR TAKE 1 TABLET BY MOUTH TWICE DAILY.   pantoprazole 40 MG tablet Commonly known as: PROTONIX TAKE 1 TABLET ONCE DAILY.   sertraline 50 MG tablet Commonly known as: ZOLOFT Take 0.5 tablets (25 mg total) by mouth daily. What changed: See the new instructions. Changed by: Marylouise Mallet X Raynaldo Falco, NP       Review of Systems  Constitutional: Negative for activity change, appetite change and fever.  HENT: Positive for hearing loss. Negative for congestion and trouble swallowing.   Eyes: Negative for visual disturbance.  Respiratory: Positive for cough. Negative for chest tightness, shortness of breath and wheezing.        Chronic occasional cough with clear phlegm.   Cardiovascular: Negative for chest pain, palpitations and leg  swelling.  Gastrointestinal: Negative for abdominal pain and constipation.       NV x1 Tuesday 11/10/19, then 3 loose stools today Thursday 11/12/19  Genitourinary: Negative for dysuria, frequency and urgency.  Musculoskeletal: Positive for arthralgias, back pain and gait problem.       Lateral left eyebrow bone aches/pain on and off, usually last 5-10 minutes, not severe enough to take Tylenol, where she stuck her head when she fell in the past, frequency is about 1/1-2 days, gets better with no intervention. No change of vision, dizziness, or focal weakness associated with the event.   Skin: Negative for color change.  Neurological: Positive for weakness. Negative for speech difficulty and headaches.       Memory lapses occasionally. Left sided weakness.   Psychiatric/Behavioral: Negative for behavioral problems and sleep disturbance. The patient is not nervous/anxious.        Depressed, not interested in activities, doesn't want to live like this    Immunization History  Administered Date(s) Administered  . Fluad Quad(high Dose 65+)  12/17/2018  . Influenza, High Dose Seasonal PF 12/08/2016  . Influenza-Unspecified 12/16/2019  . Moderna Sars-Covid-2 Vaccination 03/09/2019, 04/06/2019, 01/12/2020  . Pneumococcal Conjugate-13 06/12/2013  . Pneumococcal Polysaccharide-23 07/28/2002  . Tdap 07/27/2009  . Zoster Recombinat (Shingrix) 01/08/2017, 06/03/2017   Pertinent  Health Maintenance Due  Topic Date Due  . INFLUENZA VACCINE  10/03/2020  . DEXA SCAN  Completed  . PNA vac Low Risk Adult  Completed   Fall Risk  07/21/2020 07/06/2020 04/22/2020 04/05/2020 01/21/2020  Falls in the past year? 1 1 0 1 0  Number falls in past yr: 1 1 0 1 0  Injury with Fall? 1 1 - 0 -   Functional Status Survey:    Vitals:   07/21/20 1526  BP: (!) 142/78  Pulse: 95  Resp: 18  Temp: 97.8 F (36.6 C)  SpO2: 97%  Weight: 147 lb 9.6 oz (67 kg)  Height: 5\' 6"  (1.676 m)   Body mass index is 23.82 kg/m. Physical Exam Vitals and nursing note reviewed.  Constitutional:      Appearance: Normal appearance.  HENT:     Head: Normocephalic and atraumatic.     Mouth/Throat:     Mouth: Mucous membranes are moist.  Eyes:     Extraocular Movements: Extraocular movements intact.     Conjunctiva/sclera: Conjunctivae normal.     Pupils: Pupils are equal, round, and reactive to light.     Comments: Lateral right eye peripheral vision field loss since CVA 11/2018  Cardiovascular:     Rate and Rhythm: Normal rate and regular rhythm.     Heart sounds: No murmur heard.     Comments: HR 54 bpm Pulmonary:     Effort: Pulmonary effort is normal.     Breath sounds: No wheezing, rhonchi or rales.  Abdominal:     General: Bowel sounds are normal.     Palpations: Abdomen is soft.     Tenderness: There is no abdominal tenderness.  Musculoskeletal:     Cervical back: Normal range of motion and neck supple.     Right lower leg: No edema.     Left lower leg: No edema.     Comments: Kyphoscoliosis.   Skin:    General: Skin is warm and dry.      Findings: Bruising present.     Comments: Bruise, contusion periorbital lateral left eye/eyebrow. Resolving ecchymoses left upper arm, left wrist/hand.   Neurological:  General: No focal deficit present.     Mental Status: She is alert and oriented to person, place, and time. Mental status is at baseline.     Cranial Nerves: No cranial nerve deficit.     Motor: Weakness present.     Coordination: Coordination normal.     Gait: Gait abnormal.     Deep Tendon Reflexes: Reflexes normal.     Comments: Walker for ambulation. Left sided weakness with muscle strength 5/5.  Psychiatric:        Mood and Affect: Mood normal.        Behavior: Behavior normal.     Labs reviewed: Recent Labs    10/29/19 0700 11/16/19 0949 04/19/20 0710  NA 139 140 142  K 4.2 3.7 4.1  CL 103 104 105  CO2 28 27 31   GLUCOSE 99 93 97  BUN 17 16 18   CREATININE 0.87 0.90* 0.98*  CALCIUM 9.4 9.0 9.4   Recent Labs    09/02/19 1130 10/29/19 0700 11/16/19 0949 04/19/20 0710  AST 30 28 28 19   ALT 15 23 21 13   ALKPHOS 52  --   --   --   BILITOT 1.0 0.5 0.3 0.4  PROT 6.9 6.5 6.3 6.8  ALBUMIN 4.0  --   --   --    Recent Labs    10/29/19 0700 11/16/19 0949 04/19/20 0710  WBC 5.2 5.7 6.0  NEUTROABS 2,985 3,181 3,726  HGB 13.2 12.9 13.7  HCT 39.8 39.1 40.5  MCV 92.3 91.8 92.5  PLT 217 257 223   Lab Results  Component Value Date   TSH 4.05 06/30/2019   Lab Results  Component Value Date   HGBA1C 5.3 09/03/2019   Lab Results  Component Value Date   CHOL 137 09/03/2019   HDL 50 09/03/2019   LDLCALC 73 09/03/2019   TRIG 71 09/03/2019   CHOLHDL 2.7 09/03/2019    Significant Diagnostic Results in last 30 days:  No results found.  Assessment/Plan: Hypertension Blood pressure is controlled, switched to Losartan 12/18/19 due to cough, takes  Metoprolol.  Slow transit constipation  takes Colace qd.   GERD (gastroesophageal reflux disease) takes Pantoprazole, bloated at times. 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 Takes Plavix. Atorvastatin.affected gait and vision issues, feels weak left leg.    Gait abnormality Gait abnormality, fell 07/05/20, bettter soreness in legs and bottom, may motorized scooter for mobility.    Cough Chronic occasional dry coughfor about a year, left side throat tickles sometimes during eating, then cough,  Better after dc Lisinopril, then seasonal allergy with running nose, better on Zyrtec,  then started coughing with some clear phlegm ? Resultant ofCVA.. ST: no apparent swallowing problem.May be ENT in the future.no SOB, chest pain, sore throat, or O2 desaturation. May consider CXR if no better.    Osteoporosis  takes Fosamax, Ca, Vit D, due DEXA   Depression with anxiety Sleeps well at night, takes 2 naps a day, reduce Sertraline to 25mg  qd  Pericardial effusion Resolved.   Hyperlipidemia Hyperlipidemia, takes Atorvastatin, LDL 73 09/03/19   Incontinent of urine Getting worse gradually.   Generalized osteoarthritis of multiple sites OA pain, in thumbs ? Using walker/pressure    Family/ staff Communication: plan of care reviewed with the patient and charge nurse.   Labs/tests ordered: none  Next appointment 4 weeks.

## 2020-07-21 NOTE — Assessment & Plan Note (Signed)
takes Pantoprazole, bloated at times. Hgb 13.7 04/19/20

## 2020-07-21 NOTE — Assessment & Plan Note (Signed)
takes Colace qd.

## 2020-07-21 NOTE — Assessment & Plan Note (Signed)
OA pain, in thumbs ? Using walker/pressure

## 2020-07-21 NOTE — Assessment & Plan Note (Addendum)
Gait abnormality, fell 07/05/20, bettter soreness in legs and bottom, may motorized scooter for mobility.

## 2020-07-21 NOTE — Assessment & Plan Note (Signed)
Hyperlipidemia, takes Atorvastatin, LDL 73 09/03/19

## 2020-07-21 NOTE — Assessment & Plan Note (Addendum)
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 Takes Plavix. Atorvastatin. affected gait and vision issues, feels weak left leg.  

## 2020-07-22 ENCOUNTER — Encounter: Payer: Self-pay | Admitting: Nurse Practitioner

## 2020-07-28 ENCOUNTER — Other Ambulatory Visit: Payer: Self-pay | Admitting: Nurse Practitioner

## 2020-08-11 ENCOUNTER — Other Ambulatory Visit: Payer: Self-pay | Admitting: Nurse Practitioner

## 2020-08-11 DIAGNOSIS — D229 Melanocytic nevi, unspecified: Secondary | ICD-10-CM | POA: Diagnosis not present

## 2020-08-11 DIAGNOSIS — L814 Other melanin hyperpigmentation: Secondary | ICD-10-CM | POA: Diagnosis not present

## 2020-08-11 DIAGNOSIS — M810 Age-related osteoporosis without current pathological fracture: Secondary | ICD-10-CM

## 2020-08-11 DIAGNOSIS — L820 Inflamed seborrheic keratosis: Secondary | ICD-10-CM | POA: Diagnosis not present

## 2020-08-11 DIAGNOSIS — L821 Other seborrheic keratosis: Secondary | ICD-10-CM | POA: Diagnosis not present

## 2020-08-11 DIAGNOSIS — L570 Actinic keratosis: Secondary | ICD-10-CM | POA: Diagnosis not present

## 2020-08-16 DIAGNOSIS — M6281 Muscle weakness (generalized): Secondary | ICD-10-CM | POA: Diagnosis not present

## 2020-08-16 DIAGNOSIS — Z9181 History of falling: Secondary | ICD-10-CM | POA: Diagnosis not present

## 2020-08-16 DIAGNOSIS — R29898 Other symptoms and signs involving the musculoskeletal system: Secondary | ICD-10-CM | POA: Diagnosis not present

## 2020-08-16 DIAGNOSIS — R2681 Unsteadiness on feet: Secondary | ICD-10-CM | POA: Diagnosis not present

## 2020-08-17 DIAGNOSIS — F329 Major depressive disorder, single episode, unspecified: Secondary | ICD-10-CM | POA: Diagnosis not present

## 2020-08-19 DIAGNOSIS — M6281 Muscle weakness (generalized): Secondary | ICD-10-CM | POA: Diagnosis not present

## 2020-08-19 DIAGNOSIS — Z9181 History of falling: Secondary | ICD-10-CM | POA: Diagnosis not present

## 2020-08-19 DIAGNOSIS — R29898 Other symptoms and signs involving the musculoskeletal system: Secondary | ICD-10-CM | POA: Diagnosis not present

## 2020-08-19 DIAGNOSIS — R2681 Unsteadiness on feet: Secondary | ICD-10-CM | POA: Diagnosis not present

## 2020-08-23 DIAGNOSIS — Z9181 History of falling: Secondary | ICD-10-CM | POA: Diagnosis not present

## 2020-08-23 DIAGNOSIS — R29898 Other symptoms and signs involving the musculoskeletal system: Secondary | ICD-10-CM | POA: Diagnosis not present

## 2020-08-23 DIAGNOSIS — R2681 Unsteadiness on feet: Secondary | ICD-10-CM | POA: Diagnosis not present

## 2020-08-23 DIAGNOSIS — M6281 Muscle weakness (generalized): Secondary | ICD-10-CM | POA: Diagnosis not present

## 2020-08-24 DIAGNOSIS — F329 Major depressive disorder, single episode, unspecified: Secondary | ICD-10-CM | POA: Diagnosis not present

## 2020-08-25 ENCOUNTER — Other Ambulatory Visit: Payer: Medicare PPO

## 2020-08-26 DIAGNOSIS — M6281 Muscle weakness (generalized): Secondary | ICD-10-CM | POA: Diagnosis not present

## 2020-08-26 DIAGNOSIS — Z9181 History of falling: Secondary | ICD-10-CM | POA: Diagnosis not present

## 2020-08-26 DIAGNOSIS — R29898 Other symptoms and signs involving the musculoskeletal system: Secondary | ICD-10-CM | POA: Diagnosis not present

## 2020-08-26 DIAGNOSIS — R2681 Unsteadiness on feet: Secondary | ICD-10-CM | POA: Diagnosis not present

## 2020-08-30 DIAGNOSIS — R2681 Unsteadiness on feet: Secondary | ICD-10-CM | POA: Diagnosis not present

## 2020-08-30 DIAGNOSIS — Z9181 History of falling: Secondary | ICD-10-CM | POA: Diagnosis not present

## 2020-08-30 DIAGNOSIS — M6281 Muscle weakness (generalized): Secondary | ICD-10-CM | POA: Diagnosis not present

## 2020-08-30 DIAGNOSIS — R29898 Other symptoms and signs involving the musculoskeletal system: Secondary | ICD-10-CM | POA: Diagnosis not present

## 2020-08-31 ENCOUNTER — Non-Acute Institutional Stay (INDEPENDENT_AMBULATORY_CARE_PROVIDER_SITE_OTHER): Payer: Medicare PPO | Admitting: Family Medicine

## 2020-08-31 ENCOUNTER — Other Ambulatory Visit: Payer: Self-pay

## 2020-08-31 ENCOUNTER — Other Ambulatory Visit: Payer: Self-pay | Admitting: Internal Medicine

## 2020-08-31 VITALS — BP 124/86 | HR 52 | Temp 97.8°F | Ht 66.0 in | Wt 144.4 lb

## 2020-08-31 DIAGNOSIS — I10 Essential (primary) hypertension: Secondary | ICD-10-CM | POA: Diagnosis not present

## 2020-08-31 DIAGNOSIS — R269 Unspecified abnormalities of gait and mobility: Secondary | ICD-10-CM

## 2020-08-31 DIAGNOSIS — N3942 Incontinence without sensory awareness: Secondary | ICD-10-CM

## 2020-08-31 DIAGNOSIS — F418 Other specified anxiety disorders: Secondary | ICD-10-CM | POA: Diagnosis not present

## 2020-08-31 NOTE — Progress Notes (Signed)
Provider:  Jacalyn Lefevre, MD  Careteam: Patient Care Team: [name], [name], NP as PCP - General (Internal Medicine) Little Ishikawa, MD as PCP - Cardiology (Cardiology) [name], [name], NP as Nurse Practitioner (Internal Medicine) Marzella Schlein., MD (Ophthalmology) Arminda Resides, MD as Consulting Physician (Dermatology) Arminda Resides, MD as Consulting Physician (Dermatology) Teodora Medici, MD as Consulting Physician (Gynecology)  PLACE OF SERVICE:  [facility]  Advanced Directive information    Allergies  Allergen Reactions   Contrast Media [Iodinated Diagnostic Agents] Hives    Hives during IVP at urology office '02, requires 13 hr prep///a.calhoun  Patient came thru ER and had a 1 hour pre-medication and did fine   Oxycodone Other (See Comments)    Decreased appetite.   Boniva [Ibandronic Acid]     Nausea, vomiting, weakness   Codeine Diarrhea and Nausea And Vomiting   Darvocet [Propoxyphene N-Acetaminophen] Diarrhea and Nausea And Vomiting   Erythromycin Nausea And Vomiting   Other Other (See Comments)    No chief complaint on file.    HPI: Patient is a 86 y.o. female this is a follow-up visit.  Like last time she brings a list of things to discuss and consider she continues to have some dizziness.  There seems to be a positional component and we spent some time talking about the Epley maneuver which she is willing to try. She also complains of depression.  She is seeing a person tomorrow from counseling.  Sertraline has been decreased from 75 to 50 mg.  Might also consider Celexa or Lexapro as an alternative.  As far as depression symptoms she seems to be sleeping well and has good appetite.  She has withdrawn from some of her activities since the stroke.  This could be a symptom of depression or just inability to do things that she used to do. We also talked about her daytime sleepiness.  Reviewing her medicines again sertraline, although not usually associated with  drowsiness could be related.  Same is true of Zyrtec.  Have asked her to hold Zyrtec for period of a week or so to see if it makes any difference in her sniffles and/or drowsiness. Finally we talked about possibility of a scooter or electric wheelchair.  She has worked with physical therapy and they are supportive.  She has had some driving lessons and feels she would be a candidate.  She will look around friend's home to see if there are any used scooters or wheelchairs available for purchase.  Review of Systems:  Review of Systems  Eyes:  Positive for blurred vision.  Respiratory: Negative.    Cardiovascular: Negative.   Genitourinary:  Positive for frequency.  Neurological:  Positive for dizziness.  All other systems reviewed and are negative.  Past Medical History:  Diagnosis Date   Anginal pain (HCC) 08/24/2010   Echo-EF =>55%,LV normal   Arthritis    Cataract 12/2006   Both eyes  Epes MD   Chest pain, unspecified 09/04/2007   Lexiscan-- EF 72%; LV normal   Constipation    Takes flax seed and honey .  Smooth Move tea.   Depression    Diverticulitis    Head injury, closed    with fall   Hypertension    Mitral valve prolapse    Osteopenia    Stroke (HCC) 09/02/2019   Past Surgical History:  Procedure Laterality Date   ANTERIOR CERVICAL DECOMP/DISCECTOMY FUSION N/A 10/23/2012   Procedure: Cervical Five to Cervical Six,  Cervical Six to Cervical Seven anterior cervical decompression with fusion plating and bonegraft;  Surgeon: Hewitt Shorts, MD;  Location: [facility];  Service: Neurosurgery;  Laterality: N/A;  ANTERIOR CERVICAL DECOMPRESSION/DISCECTOMY FUSION 2 LEVELS   BREAST CYST ASPIRATION  1990   benign   CARDIAC CATHETERIZATION  08/01/2010   normal coronaries   COLONOSCOPY  approx 2011   EYE SURGERY Bilateral 2011   intestinal blockage surgery  Feb. 2012   "kink in small intestine" per pt   Social History:   reports that she has never smoked. She has never used  smokeless tobacco. She reports previous alcohol use of about 4.0 standard drinks of alcohol per week. She reports that she does not use drugs.  Family History  Problem Relation Age of Onset   Hypertension Mother    Transient ischemic attack Mother    Parkinson's disease Mother    Dementia Mother    CVA Mother    Diabetes Father    Coronary artery disease Father    Heart failure Father    Cancer Father        prostate   Depression Father    Congestive Heart Failure Father    Prostate cancer Brother    Diabetes Brother    Prostate cancer Paternal Grandmother     Medications: Patient's Medications  New Prescriptions   No medications on file  Previous Medications   ALENDRONATE (FOSAMAX) 70 MG TABLET    TAKE 1 TAB ONCE A WEEK, AT LEAST 30 MIN BEFORE 1ST FOOD.DO NOT LIE DOWN FOR 30 MIN AFTER TAKING.   ATORVASTATIN (LIPITOR) 20 MG TABLET    TAKE 1 TABLET BY MOUTH DAILY.   CALCIUM CARB-CHOLECALCIFEROL (CALCIUM 600+D3 PO)    Take 600 mg by mouth daily.    CETIRIZINE (ZYRTEC) 5 MG TABLET    Take 1 tablet (5 mg total) by mouth daily.   CLOPIDOGREL (PLAVIX) 75 MG TABLET    TAKE 1 TABLET ONCE DAILY.   DOCUSATE SODIUM (COLACE) 100 MG CAPSULE    Take 100 mg by mouth at bedtime.   LOSARTAN (COZAAR) 50 MG TABLET    Take 1 tablet (50 mg total) by mouth daily.   METOPROLOL TARTRATE (LOPRESSOR) 25 MG TABLET    TAKE 1 TABLET BY MOUTH TWICE DAILY.   PANTOPRAZOLE (PROTONIX) 40 MG TABLET    TAKE 1 TABLET ONCE DAILY.   SERTRALINE (ZOLOFT) 50 MG TABLET    Take 0.5 tablets (25 mg total) by mouth daily.  Modified Medications   No medications on file  Discontinued Medications   No medications on file    Physical Exam:  There were no vitals filed for this visit. There is no height or weight on file to calculate BMI. Wt Readings from Last 3 Encounters:  07/21/20 147 lb 9.6 oz (67 kg)  07/06/20 140 lb 9.6 oz (63.8 kg)  04/22/20 145 lb 12.8 oz (66.1 kg)    Physical Exam Vitals reviewed.   Constitutional:      Appearance: Normal appearance.  Cardiovascular:     Rate and Rhythm: Normal rate.  Pulmonary:     Effort: Pulmonary effort is normal.  Neurological:     Mental Status: She is alert and oriented to person, place, and time.     Comments: She has some left lower extremity weakness since the stroke about 1 year ago  Psychiatric:        Mood and Affect: Mood normal.        Behavior:  Behavior normal.        Thought Content: Thought content normal.        Judgment: Judgment normal.    Labs reviewed: Basic Metabolic Panel: Recent Labs    10/29/19 0700 11/16/19 0949 04/19/20 0710  NA 139 140 142  K 4.2 3.7 4.1  CL 103 104 105  CO2 28 27 31   GLUCOSE 99 93 97  BUN 17 16 18   CREATININE 0.87 0.90* 0.98*  CALCIUM 9.4 9.0 9.4   Liver Function Tests: Recent Labs    09/02/19 1130 10/29/19 0700 11/16/19 0949 04/19/20 0710  AST 30 28 28 19   ALT 15 23 21 13   ALKPHOS 52  --   --   --   BILITOT 1.0 0.5 0.3 0.4  PROT 6.9 6.5 6.3 6.8  ALBUMIN 4.0  --   --   --    No results for input(s): LIPASE, AMYLASE in the last 8760 hours. No results for input(s): AMMONIA in the last 8760 hours. CBC: Recent Labs    10/29/19 0700 11/16/19 0949 04/19/20 0710  WBC 5.2 5.7 6.0  NEUTROABS 2,985 3,181 3,726  HGB 13.2 12.9 13.7  HCT 39.8 39.1 40.5  MCV 92.3 91.8 92.5  PLT 217 257 223   Lipid Panel: Recent Labs    09/03/19 0331  CHOL 137  HDL 50  LDLCALC 73  TRIG 71  CHOLHDL 2.7   TSH: No results for input(s): TSH in the last 8760 hours. A1C: Lab Results  Component Value Date   HGBA1C 5.3 09/03/2019     Assessment/Plan 1. Depression with anxiety May want to try new medication.  I would favor citalopram or Lexapro if psychologist agrees  2. Primary hypertension Blood pressure is good today at 124/86.  She takes losartan 50 mg.  3. Urinary incontinence without sensory awareness Most of her incontinence is during the night.  It sounds more like stress  rather than urge incontinence as she does not have times during the day when she feels the need to void and loses large amounts of urine.  We talked about medicines for incontinence and potential for side effects which she already has some of including dizziness blurred vision and dry mouth.  We decided to take a more practical approach limiting her fluid intake after the evening meal and emptying her bladder before bedtime.  4. Gait abnormality She continues to walk greater distances using her walker but tires easily.  I think a power chair would increase her happiness and ability to move about the facility.    11/18/19, MD [facility] [phone]

## 2020-09-01 DIAGNOSIS — F329 Major depressive disorder, single episode, unspecified: Secondary | ICD-10-CM | POA: Diagnosis not present

## 2020-09-06 DIAGNOSIS — H524 Presbyopia: Secondary | ICD-10-CM | POA: Diagnosis not present

## 2020-09-06 DIAGNOSIS — H5203 Hypermetropia, bilateral: Secondary | ICD-10-CM | POA: Diagnosis not present

## 2020-09-06 DIAGNOSIS — H26493 Other secondary cataract, bilateral: Secondary | ICD-10-CM | POA: Diagnosis not present

## 2020-09-06 DIAGNOSIS — H43813 Vitreous degeneration, bilateral: Secondary | ICD-10-CM | POA: Diagnosis not present

## 2020-09-06 DIAGNOSIS — H52223 Regular astigmatism, bilateral: Secondary | ICD-10-CM | POA: Diagnosis not present

## 2020-09-06 DIAGNOSIS — H53461 Homonymous bilateral field defects, right side: Secondary | ICD-10-CM | POA: Diagnosis not present

## 2020-09-12 DIAGNOSIS — F329 Major depressive disorder, single episode, unspecified: Secondary | ICD-10-CM | POA: Diagnosis not present

## 2020-09-13 DIAGNOSIS — D229 Melanocytic nevi, unspecified: Secondary | ICD-10-CM | POA: Diagnosis not present

## 2020-09-13 DIAGNOSIS — L821 Other seborrheic keratosis: Secondary | ICD-10-CM | POA: Diagnosis not present

## 2020-09-13 DIAGNOSIS — L814 Other melanin hyperpigmentation: Secondary | ICD-10-CM | POA: Diagnosis not present

## 2020-09-13 DIAGNOSIS — L570 Actinic keratosis: Secondary | ICD-10-CM | POA: Diagnosis not present

## 2020-09-19 DIAGNOSIS — F329 Major depressive disorder, single episode, unspecified: Secondary | ICD-10-CM | POA: Diagnosis not present

## 2020-09-21 ENCOUNTER — Other Ambulatory Visit: Payer: Self-pay

## 2020-09-21 ENCOUNTER — Non-Acute Institutional Stay: Payer: Medicare PPO | Admitting: Family Medicine

## 2020-09-21 ENCOUNTER — Encounter: Payer: Self-pay | Admitting: Family Medicine

## 2020-09-21 VITALS — BP 136/82 | HR 51 | Temp 96.6°F | Ht 66.0 in | Wt 144.2 lb

## 2020-09-21 DIAGNOSIS — M810 Age-related osteoporosis without current pathological fracture: Secondary | ICD-10-CM

## 2020-09-21 DIAGNOSIS — F418 Other specified anxiety disorders: Secondary | ICD-10-CM | POA: Diagnosis not present

## 2020-09-21 DIAGNOSIS — Z8673 Personal history of transient ischemic attack (TIA), and cerebral infarction without residual deficits: Secondary | ICD-10-CM

## 2020-09-21 DIAGNOSIS — E785 Hyperlipidemia, unspecified: Secondary | ICD-10-CM

## 2020-09-21 DIAGNOSIS — I10 Essential (primary) hypertension: Secondary | ICD-10-CM

## 2020-09-21 MED ORDER — CITALOPRAM HYDROBROMIDE 10 MG PO TABS: 10.0000 mg | ORAL_TABLET | Freq: Every day | ORAL | 2 refills | Status: DC

## 2020-09-21 NOTE — Progress Notes (Addendum)
Provider:  Jacalyn Lefevre, MD  Careteam: Patient Care Team: [name], [name], NP as PCP - General (Internal Medicine) Little Ishikawa, MD as PCP - Cardiology (Cardiology) [name], [name], NP as Nurse Practitioner (Internal Medicine) Marzella Schlein., MD (Ophthalmology) Arminda Resides, MD as Consulting Physician (Dermatology) Arminda Resides, MD as Consulting Physician (Dermatology) Teodora Medici, MD as Consulting Physician (Gynecology)  PLACE OF SERVICE:  [facility]  Advanced Directive information    Allergies  Allergen Reactions   Contrast Media [Iodinated Diagnostic Agents] Hives    Hives during IVP at urology office '02, requires 13 hr prep///a.calhoun  Patient came thru ER and had a 1 hour pre-medication and did fine   Oxycodone Other (See Comments)    Decreased appetite.   Boniva [Ibandronic Acid]     Nausea, vomiting, weakness   Codeine Diarrhea and Nausea And Vomiting   Darvocet [Propoxyphene N-Acetaminophen] Diarrhea and Nausea And Vomiting   Erythromycin Nausea And Vomiting   Other Other (See Comments)    No chief complaint on file.    HPI: Patient is a 86 y.o. female .  This is a follow-up visit.  She was seen several weeks ago and we elected to taper her sertraline.  Her first question today is regarding that tapering she has almost finished the taper.  She has talked to her psychologist since that visit who told her that either citalopram or Lexapro would be okay to take in place of sertraline.  She would like to do this so I will start Celexa at 10 mg. In reviewing her labs it has been greater than 1 year since her lipids and liver were checked and she does take atorvastatin to control his numbers.  We will plan to do that in the morning. Ask about status of her acquiring a scooter or electric wheelchair.  She is still thinking about that.  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 She ask about continued use of bisphosphonate.  She received a letter from dentist recently about what sounds like osteonecrosis and we discussed that and while it is a slight risk I feel like the benefit is greater than the wrist.  I did offer her a vacation from the pill but she did not seem interested in that.  Review of Systems:  Review of Systems  Constitutional: Negative.   HENT: Negative.    Respiratory: Negative.    Cardiovascular: Negative.   Neurological:  Positive for focal weakness. Negative for weakness.  All other systems reviewed and are negative.  Past Medical History:  Diagnosis Date   Anginal pain (HCC) 08/24/2010   Echo-EF =>55%,LV normal   Arthritis    Cataract 12/2006   Both eyes  Epes MD   Chest pain, unspecified 09/04/2007   Lexiscan-- EF 72%; LV normal   Constipation    Takes flax seed and honey .  Smooth Move tea.   Depression    Diverticulitis    Head injury, closed    with fall   Hypertension    Mitral valve prolapse    Osteopenia    Stroke (HCC) 09/02/2019   Past Surgical History:  Procedure Laterality Date   ANTERIOR CERVICAL DECOMP/DISCECTOMY FUSION N/A 10/23/2012   Procedure: Cervical Five to Cervical Six, Cervical Six to Cervical Seven anterior cervical decompression with fusion plating and bonegraft;  Surgeon: Hewitt Shorts, MD;  Location: [facility];  Service:  Neurosurgery;  Laterality: N/A;  ANTERIOR CERVICAL DECOMPRESSION/DISCECTOMY FUSION 2 LEVELS   BREAST CYST ASPIRATION  1990   benign   CARDIAC CATHETERIZATION  08/01/2010   normal coronaries   COLONOSCOPY  approx 2011   EYE SURGERY Bilateral 2011   intestinal blockage surgery  Feb. 2012   "kink in small intestine" per pt   Social History:   reports that she has never smoked. She has never used smokeless tobacco. She reports previous alcohol use of about 4.0 standard drinks of alcohol per week. She reports that she does not use drugs.  Family History  Problem Relation  Age of Onset   Hypertension Mother    Transient ischemic attack Mother    Parkinson's disease Mother    Dementia Mother    CVA Mother    Diabetes Father    Coronary artery disease Father    Heart failure Father    Cancer Father        prostate   Depression Father    Congestive Heart Failure Father    Prostate cancer Brother    Diabetes Brother    Prostate cancer Paternal Grandmother     Medications: Patient's Medications  New Prescriptions   No medications on file  Previous Medications   ALENDRONATE (FOSAMAX) 70 MG TABLET    TAKE 1 TAB ONCE A WEEK, AT LEAST 30 MIN BEFORE 1ST FOOD.DO NOT LIE DOWN FOR 30 MIN AFTER TAKING.   ATORVASTATIN (LIPITOR) 20 MG TABLET    TAKE ONE TABLET BY MOUTH ONCE DAILY.   CALCIUM CARB-CHOLECALCIFEROL (CALCIUM 600+D3 PO)    Take 600 mg by mouth daily.    CETIRIZINE (ZYRTEC) 5 MG TABLET    Take 1 tablet (5 mg total) by mouth daily.   CLOPIDOGREL (PLAVIX) 75 MG TABLET    TAKE 1 TABLET ONCE DAILY.   DOCUSATE SODIUM (COLACE) 100 MG CAPSULE    Take 100 mg by mouth at bedtime.   LOSARTAN (COZAAR) 50 MG TABLET    Take 1 tablet (50 mg total) by mouth daily.   METOPROLOL TARTRATE (LOPRESSOR) 25 MG TABLET    TAKE 1 TABLET BY MOUTH TWICE DAILY.   PANTOPRAZOLE (PROTONIX) 40 MG TABLET    TAKE 1 TABLET ONCE DAILY.   SERTRALINE (ZOLOFT) 50 MG TABLET    Take 0.5 tablets (25 mg total) by mouth daily.  Modified Medications   No medications on file  Discontinued Medications   No medications on file    Physical Exam:  There were no vitals filed for this visit. There is no height or weight on file to calculate BMI. Wt Readings from Last 3 Encounters:  08/31/20 144 lb 6.4 oz (65.5 kg)  07/21/20 147 lb 9.6 oz (67 kg)  07/06/20 140 lb 9.6 oz (63.8 kg)    Physical Exam Vitals and nursing note reviewed.  Constitutional:      Appearance: Normal appearance.     Comments: She appears brighter and perkier than on previous visits which suggest to me that she is feeling  better and more positive after her stroke.  Cardiovascular:     Rate and Rhythm: Normal rate and regular rhythm.  Pulmonary:     Effort: Pulmonary effort is normal.     Breath sounds: Normal breath sounds.  Neurological:     Mental Status: She is alert.    Labs reviewed: Basic Metabolic Panel: Recent Labs    10/29/19 0700 11/16/19 0949 04/19/20 0710  NA 139 140 142  K 4.2 3.7 4.1  CL 103 104 105  CO2 28 27 31   GLUCOSE 99 93 97  BUN 17 16 18   CREATININE 0.87 0.90* 0.98*  CALCIUM 9.4 9.0 9.4   Liver Function Tests: Recent Labs    10/29/19 0700 11/16/19 0949 04/19/20 0710  AST 28 28 19   ALT 23 21 13   BILITOT 0.5 0.3 0.4  PROT 6.5 6.3 6.8   No results for input(s): LIPASE, AMYLASE in the last 8760 hours. No results for input(s): AMMONIA in the last 8760 hours. CBC: Recent Labs    10/29/19 0700 11/16/19 0949 04/19/20 0710  WBC 5.2 5.7 6.0  NEUTROABS 2,985 3,181 3,726  HGB 13.2 12.9 13.7  HCT 39.8 39.1 40.5  MCV 92.3 91.8 92.5  PLT 217 257 223   Lipid Panel: No results for input(s): CHOL, HDL, LDLCALC, TRIG, CHOLHDL, LDLDIRECT in the last 8760 hours. TSH: No results for input(s): TSH in the last 8760 hours. A1C: Lab Results  Component Value Date   HGBA1C 5.3 09/03/2019     Assessment/Plan  1. Primary hypertension Blood pressure is good at 136/82 she continues to take metoprolol as well as losartan for blood pressure - COMPLETE METABOLIC PANEL WITH GFR  2. Hyperlipidemia, unspecified hyperlipidemia type Last LDL was at goal at 73 but as stated above and needs to be checked at today's visit - Lipid Panel  3. Depression with anxiety We are basically off sertraline now and will begin Celexa - citalopram (CELEXA) 10 MG tablet; Take 1 tablet (10 mg total) by mouth daily.  Dispense: 30 tablet; Refill: 2  4. Osteoporosis without current pathological fracture, unspecified osteoporosis type Continues with Fosamax.  Series was due for a bone density test  but there was some problems with transportation and it is rescheduled for January.  Her last bone mineral density was May 2019  5. History of CVA (cerebrovascular accident) Uses a walker still has some lower extremity weakness but I do not believe she is in therapy at this time   04/21/20, MD [facility] [phone]

## 2020-09-22 ENCOUNTER — Other Ambulatory Visit: Payer: Medicare PPO

## 2020-09-22 ENCOUNTER — Other Ambulatory Visit: Payer: Self-pay

## 2020-09-26 DIAGNOSIS — F329 Major depressive disorder, single episode, unspecified: Secondary | ICD-10-CM | POA: Diagnosis not present

## 2020-09-27 NOTE — Addendum Note (Signed)
Addended by: Frederica Kuster on: 09/27/2020 08:28 AM   Modules accepted: Level of Service

## 2020-09-29 ENCOUNTER — Other Ambulatory Visit: Payer: Self-pay

## 2020-09-29 ENCOUNTER — Other Ambulatory Visit: Payer: Medicare PPO

## 2020-10-03 ENCOUNTER — Other Ambulatory Visit: Payer: Self-pay

## 2020-10-03 DIAGNOSIS — E785 Hyperlipidemia, unspecified: Secondary | ICD-10-CM

## 2020-10-03 DIAGNOSIS — I10 Essential (primary) hypertension: Secondary | ICD-10-CM

## 2020-10-04 ENCOUNTER — Other Ambulatory Visit: Payer: Self-pay

## 2020-10-04 ENCOUNTER — Other Ambulatory Visit: Payer: Medicare PPO

## 2020-10-04 DIAGNOSIS — I10 Essential (primary) hypertension: Secondary | ICD-10-CM | POA: Diagnosis not present

## 2020-10-04 DIAGNOSIS — E785 Hyperlipidemia, unspecified: Secondary | ICD-10-CM | POA: Diagnosis not present

## 2020-10-05 ENCOUNTER — Telehealth: Payer: Self-pay | Admitting: *Deleted

## 2020-10-05 DIAGNOSIS — L821 Other seborrheic keratosis: Secondary | ICD-10-CM | POA: Diagnosis not present

## 2020-10-05 DIAGNOSIS — D229 Melanocytic nevi, unspecified: Secondary | ICD-10-CM | POA: Diagnosis not present

## 2020-10-05 DIAGNOSIS — L570 Actinic keratosis: Secondary | ICD-10-CM | POA: Diagnosis not present

## 2020-10-05 DIAGNOSIS — L814 Other melanin hyperpigmentation: Secondary | ICD-10-CM | POA: Diagnosis not present

## 2020-10-05 DIAGNOSIS — Z85828 Personal history of other malignant neoplasm of skin: Secondary | ICD-10-CM | POA: Diagnosis not present

## 2020-10-05 LAB — COMPLETE METABOLIC PANEL WITH GFR
AG Ratio: 1.5 (calc) (ref 1.0–2.5)
ALT: 12 U/L (ref 6–29)
AST: 22 U/L (ref 10–35)
Albumin: 4.2 g/dL (ref 3.6–5.1)
Alkaline phosphatase (APISO): 60 U/L (ref 37–153)
BUN: 19 mg/dL (ref 7–25)
CO2: 29 mmol/L (ref 20–32)
Calcium: 9.8 mg/dL (ref 8.6–10.4)
Chloride: 105 mmol/L (ref 98–110)
Creat: 0.87 mg/dL (ref 0.60–0.95)
Globulin: 2.8 g/dL (calc) (ref 1.9–3.7)
Glucose, Bld: 100 mg/dL — ABNORMAL HIGH (ref 65–99)
Potassium: 3.9 mmol/L (ref 3.5–5.3)
Sodium: 142 mmol/L (ref 135–146)
Total Bilirubin: 0.4 mg/dL (ref 0.2–1.2)
Total Protein: 7 g/dL (ref 6.1–8.1)
eGFR: 66 mL/min/{1.73_m2} (ref >=60)

## 2020-10-05 LAB — LIPID PANEL
Cholesterol: 137 mg/dL (ref <200)
HDL: 51 mg/dL (ref >=50)
LDL Cholesterol (Calc): 73 mg/dL (calc)
Non-HDL Cholesterol (Calc): 86 mg/dL (calc) (ref <130)
Total CHOL/HDL Ratio: 2.7 (calc) (ref <5.0)
Triglycerides: 54 mg/dL (ref <150)

## 2020-10-05 NOTE — Telephone Encounter (Signed)
Patient called and stated that she has still been taking the Sertraline every other day and has stopped it a few days ago and wonders when she can start taking the Citalopram.   Informed patient that she can go ahead and start it per Dr. Rondel Baton note.  Patient agreed and stated that she has already gotten it from the pharmacy.  Medication list updated.     OV Note Dated 09/21/2020: 3. Depression with anxiety We are basically off sertraline now and will begin Celexa - citalopram (CELEXA) 10 MG tablet; Take 1 tablet (10 mg total) by mouth daily.  Dispense: 30 tablet; Refill: 2

## 2020-10-10 DIAGNOSIS — F329 Major depressive disorder, single episode, unspecified: Secondary | ICD-10-CM | POA: Diagnosis not present

## 2020-10-11 DIAGNOSIS — M810 Age-related osteoporosis without current pathological fracture: Secondary | ICD-10-CM | POA: Diagnosis not present

## 2020-10-11 DIAGNOSIS — I509 Heart failure, unspecified: Secondary | ICD-10-CM | POA: Diagnosis not present

## 2020-10-11 DIAGNOSIS — K219 Gastro-esophageal reflux disease without esophagitis: Secondary | ICD-10-CM | POA: Diagnosis not present

## 2020-10-11 DIAGNOSIS — F329 Major depressive disorder, single episode, unspecified: Secondary | ICD-10-CM | POA: Diagnosis not present

## 2020-10-11 DIAGNOSIS — M8580 Other specified disorders of bone density and structure, unspecified site: Secondary | ICD-10-CM | POA: Diagnosis not present

## 2020-10-11 DIAGNOSIS — R32 Unspecified urinary incontinence: Secondary | ICD-10-CM | POA: Diagnosis not present

## 2020-10-11 DIAGNOSIS — I110 Hypertensive heart disease with heart failure: Secondary | ICD-10-CM | POA: Diagnosis not present

## 2020-10-11 DIAGNOSIS — I69334 Monoplegia of upper limb following cerebral infarction affecting left non-dominant side: Secondary | ICD-10-CM | POA: Diagnosis not present

## 2020-10-11 DIAGNOSIS — E785 Hyperlipidemia, unspecified: Secondary | ICD-10-CM | POA: Diagnosis not present

## 2020-10-17 DIAGNOSIS — H53461 Homonymous bilateral field defects, right side: Secondary | ICD-10-CM | POA: Diagnosis not present

## 2020-10-17 DIAGNOSIS — H43813 Vitreous degeneration, bilateral: Secondary | ICD-10-CM | POA: Diagnosis not present

## 2020-10-17 DIAGNOSIS — H26493 Other secondary cataract, bilateral: Secondary | ICD-10-CM | POA: Diagnosis not present

## 2020-10-24 DIAGNOSIS — F329 Major depressive disorder, single episode, unspecified: Secondary | ICD-10-CM | POA: Diagnosis not present

## 2020-10-25 ENCOUNTER — Other Ambulatory Visit: Payer: Self-pay

## 2020-10-25 ENCOUNTER — Ambulatory Visit
Admission: RE | Admit: 2020-10-25 | Discharge: 2020-10-25 | Disposition: A | Discharge status: Home / Self Care | Payer: Medicare PPO | Source: Ambulatory Visit | Attending: Nurse Practitioner | Admitting: Nurse Practitioner

## 2020-10-25 DIAGNOSIS — E2839 Other primary ovarian failure: Secondary | ICD-10-CM

## 2020-10-25 DIAGNOSIS — M2040 Other hammer toe(s) (acquired), unspecified foot: Secondary | ICD-10-CM | POA: Diagnosis not present

## 2020-10-25 DIAGNOSIS — M85852 Other specified disorders of bone density and structure, left thigh: Secondary | ICD-10-CM | POA: Diagnosis not present

## 2020-10-25 DIAGNOSIS — L602 Onychogryphosis: Secondary | ICD-10-CM | POA: Diagnosis not present

## 2020-10-25 DIAGNOSIS — Z780 Asymptomatic menopausal state: Secondary | ICD-10-CM | POA: Diagnosis not present

## 2020-10-26 ENCOUNTER — Non-Acute Institutional Stay: Payer: Medicare PPO | Admitting: Family Medicine

## 2020-10-26 ENCOUNTER — Encounter: Payer: Self-pay | Admitting: Family Medicine

## 2020-10-26 VITALS — BP 122/82 | HR 51 | Temp 98.6°F | Ht 66.0 in | Wt 148.6 lb

## 2020-10-26 DIAGNOSIS — H6122 Impacted cerumen, left ear: Secondary | ICD-10-CM | POA: Diagnosis not present

## 2020-10-26 DIAGNOSIS — M810 Age-related osteoporosis without current pathological fracture: Secondary | ICD-10-CM | POA: Diagnosis not present

## 2020-10-26 DIAGNOSIS — F418 Other specified anxiety disorders: Secondary | ICD-10-CM | POA: Diagnosis not present

## 2020-10-26 NOTE — Progress Notes (Addendum)
Provider:  Jacalyn Lefevre, MD  Careteam: Patient Care Team: [name], [name], NP as PCP - General (Internal Medicine) Little Ishikawa, MD as PCP - Cardiology (Cardiology) [name], [name], NP as Nurse Practitioner (Internal Medicine) Marzella Schlein., MD (Ophthalmology) Arminda Resides, MD as Consulting Physician (Dermatology) Arminda Resides, MD as Consulting Physician (Dermatology) Teodora Medici, MD as Consulting Physician (Gynecology)  PLACE OF SERVICE:  [facility]  Advanced Directive information    Allergies  Allergen Reactions   Contrast Media [Iodinated Diagnostic Agents] Hives    Hives during IVP at urology office '02, requires 13 hr prep///a.calhoun  Patient came thru ER and had a 1 hour pre-medication and did fine   Oxycodone Other (See Comments)    Decreased appetite.   Boniva [Ibandronic Acid]     Nausea, vomiting, weakness   Codeine Diarrhea and Nausea And Vomiting   Darvocet [Propoxyphene N-Acetaminophen] Diarrhea and Nausea And Vomiting   Erythromycin Nausea And Vomiting   Other Other (See Comments)    Chief Complaint  Patient presents with   Medical Management of Chronic Issues    Patient presents today for a 1 month follow-up.     HPI: Patient is a 86 y.o. female.  Patient is here to follow-up initiation of new antidepressant but also has several other issues.  First she complains of her right ear feeling stopped up with decreased hearing on that side.  Other issues include daytime somnolence.  There is no history of sleep apnea and no one to really tell her she is having apneic episodes while sleeping but her history is suggestive.  Also consider narcolepsy. In addition she had bone density done yesterday and is wondering about results of that.  That test result is not available as of today's visit. She is also still thinking about use of a mobility assist device such as a scooter we have talked about this on several recent visits.  I have asked her to  contact physical therapy here at the facility to talk about feasibility.  Review of Systems:  Review of Systems  Constitutional: Negative.   HENT:  Positive for hearing loss.        Right ear  Neurological:  Positive for dizziness and headaches.       She has had some dizziness as well as fleeting head pain since her strokes.  Psychiatric/Behavioral:         Reports daytime somnolence  All other systems reviewed and are negative.  Past Medical History:  Diagnosis Date   Anginal pain (HCC) 08/24/2010   Echo-EF =>55%,LV normal   Arthritis    Cataract 12/2006   Both eyes  Epes MD   Chest pain, unspecified 09/04/2007   Lexiscan-- EF 72%; LV normal   Constipation    Takes flax seed and honey .  Smooth Move tea.   Depression    Diverticulitis    Head injury, closed    with fall   Hypertension    Mitral valve prolapse    Osteopenia    Stroke (HCC) 09/02/2019   Past Surgical History:  Procedure Laterality Date   ANTERIOR CERVICAL DECOMP/DISCECTOMY FUSION N/A 10/23/2012   Procedure: Cervical Five to Cervical Six, Cervical Six to Cervical Seven anterior cervical decompression with fusion plating and bonegraft;  Surgeon: Hewitt Shorts, MD;  Location: [facility];  Service: Neurosurgery;  Laterality: N/A;  ANTERIOR CERVICAL DECOMPRESSION/DISCECTOMY FUSION 2 LEVELS   BREAST CYST ASPIRATION  1990   benign  CARDIAC CATHETERIZATION  08/01/2010   normal coronaries   COLONOSCOPY  approx 2011   EYE SURGERY Bilateral 2011   intestinal blockage surgery  Feb. 2012   "kink in small intestine" per pt   Social History:   reports that she has never smoked. She has never used smokeless tobacco. She reports that she does not currently use alcohol after a past usage of about 4.0 standard drinks per week. She reports that she does not use drugs.  Family History  Problem Relation Age of Onset   Hypertension Mother    Transient ischemic attack Mother    Parkinson's disease Mother     Dementia Mother    CVA Mother    Diabetes Father    Coronary artery disease Father    Heart failure Father    Cancer Father        prostate   Depression Father    Congestive Heart Failure Father    Prostate cancer Brother    Diabetes Brother    Prostate cancer Paternal Grandmother     Medications: Patient's Medications  New Prescriptions   No medications on file  Previous Medications   ALENDRONATE (FOSAMAX) 70 MG TABLET    TAKE 1 TAB ONCE A WEEK, AT LEAST 30 MIN BEFORE 1ST FOOD.DO NOT LIE DOWN FOR 30 MIN AFTER TAKING.   ATORVASTATIN (LIPITOR) 20 MG TABLET    TAKE ONE TABLET BY MOUTH ONCE DAILY.   CALCIUM CARB-CHOLECALCIFEROL (CALCIUM 600+D3 PO)    Take 600 mg by mouth daily.    CETIRIZINE (ZYRTEC) 5 MG TABLET    Take 1 tablet (5 mg total) by mouth daily.   CITALOPRAM (CELEXA) 10 MG TABLET    Take 1 tablet (10 mg total) by mouth daily.   CLOPIDOGREL (PLAVIX) 75 MG TABLET    TAKE 1 TABLET ONCE DAILY.   DOCUSATE SODIUM (COLACE) 100 MG CAPSULE    Take 100 mg by mouth at bedtime.   LOSARTAN (COZAAR) 50 MG TABLET    Take 1 tablet (50 mg total) by mouth daily.   METOPROLOL TARTRATE (LOPRESSOR) 25 MG TABLET    TAKE 1 TABLET BY MOUTH TWICE DAILY.   PANTOPRAZOLE (PROTONIX) 40 MG TABLET    TAKE 1 TABLET ONCE DAILY.  Modified Medications   No medications on file  Discontinued Medications   No medications on file    Physical Exam:  Vitals:   10/26/20 1505  BP: 122/82  Pulse: (!) 51  Temp: 98.6 F (37 C)  TempSrc: Temporal  SpO2: 97%  Weight: 148 lb 9.6 oz (67.4 kg)  Height: 5\' 6"  (1.676 m)   Body mass index is 23.98 kg/m. Wt Readings from Last 3 Encounters:  10/26/20 148 lb 9.6 oz (67.4 kg)  09/21/20 144 lb 3.2 oz (65.4 kg)  08/31/20 144 lb 6.4 oz (65.5 kg)    Physical Exam Vitals and nursing note reviewed.  Constitutional:      Appearance: Normal appearance.  HENT:     Ears:     Comments: Both EXTR sternal auditory canals have increased cerumen.  Cannot visualize  right TM at all.  This was successfully irrigated Cardiovascular:     Rate and Rhythm: Normal rate and regular rhythm.  Pulmonary:     Effort: Pulmonary effort is normal.     Breath sounds: Normal breath sounds.  Neurological:     General: No focal deficit present.     Mental Status: She is alert and oriented to person, place, and time.  Psychiatric:        Behavior: Behavior normal.     Comments: Patient reports improvement in depression after starting citalopram about 1 month ago.  She states that friends have also noticed improvement.  I did suggest trying that medication midmorning as manufacturer suggest to see if this might help her daytime sleepiness.    Labs reviewed: Basic Metabolic Panel: Recent Labs    11/16/19 0949 04/19/20 0710 10/04/20 0650  NA 140 142 142  K 3.7 4.1 3.9  CL 104 105 105  CO2 27 31 29   GLUCOSE 93 97 100*  BUN 16 18 19   CREATININE 0.90* 0.98* 0.87  CALCIUM 9.0 9.4 9.8   Liver Function Tests: Recent Labs    11/16/19 0949 04/19/20 0710 10/04/20 0650  AST 28 19 22   ALT 21 13 12   BILITOT 0.3 0.4 0.4  PROT 6.3 6.8 7.0   No results for input(s): LIPASE, AMYLASE in the last 8760 hours. No results for input(s): AMMONIA in the last 8760 hours. CBC: Recent Labs    10/29/19 0700 11/16/19 0949 04/19/20 0710  WBC 5.2 5.7 6.0  NEUTROABS 2,985 3,181 3,726  HGB 13.2 12.9 13.7  HCT 39.8 39.1 40.5  MCV 92.3 91.8 92.5  PLT 217 257 223   Lipid Panel: Recent Labs    10/04/20 0650  CHOL 137  HDL 51  LDLCALC 73  TRIG 54  CHOLHDL 2.7   TSH: No results for input(s): TSH in the last 8760 hours. A1C: Lab Results  Component Value Date   HGBA1C 5.3 09/03/2019     Assessment/Plan  1. Depression with anxiety Improved with citalopram continue same 10 mg dose  2. Impacted cerumen of left ear Successfully irrigated and patient reports improvement in hearing loss  3. Osteoporosis without current pathological fracture, unspecified  osteoporosis type Bone mineral density result pending.  She continues on Fosamax along with calcium and vitamin D   11/18/19, MD [facility] [phone]

## 2020-10-26 NOTE — Addendum Note (Signed)
Addended by: Frederica Kuster on: 10/26/2020 04:43 PM   Modules accepted: Level of Service

## 2020-11-03 ENCOUNTER — Other Ambulatory Visit: Payer: Self-pay | Admitting: Internal Medicine

## 2020-11-08 DIAGNOSIS — F329 Major depressive disorder, single episode, unspecified: Secondary | ICD-10-CM | POA: Diagnosis not present

## 2020-11-11 ENCOUNTER — Ambulatory Visit: Payer: Medicare PPO | Admitting: Student

## 2020-11-16 ENCOUNTER — Other Ambulatory Visit: Payer: Self-pay | Admitting: Nurse Practitioner

## 2020-11-16 DIAGNOSIS — M810 Age-related osteoporosis without current pathological fracture: Secondary | ICD-10-CM

## 2020-11-16 NOTE — Telephone Encounter (Signed)
Patient has request refill on medication "Fosamax 70mg ". Patient last refill was 08/11/2020. Patient medication has warnings. Medication pend and sent to PCP [name], [name], NP for approval. Please Advise.

## 2020-11-29 DIAGNOSIS — F329 Major depressive disorder, single episode, unspecified: Secondary | ICD-10-CM | POA: Diagnosis not present

## 2020-12-07 ENCOUNTER — Other Ambulatory Visit: Payer: Self-pay

## 2020-12-07 ENCOUNTER — Non-Acute Institutional Stay: Payer: Medicare PPO | Admitting: Family Medicine

## 2020-12-07 ENCOUNTER — Encounter: Payer: Self-pay | Admitting: Family Medicine

## 2020-12-07 VITALS — BP 124/78 | HR 63 | Temp 96.7°F | Ht 66.0 in | Wt 148.6 lb

## 2020-12-07 DIAGNOSIS — F418 Other specified anxiety disorders: Secondary | ICD-10-CM | POA: Diagnosis not present

## 2020-12-07 DIAGNOSIS — I10 Essential (primary) hypertension: Secondary | ICD-10-CM

## 2020-12-07 MED ORDER — AMLODIPINE BESYLATE 2.5 MG PO TABS: 2.5000 mg | ORAL_TABLET | Freq: Every day | ORAL | 1 refills | Status: DC

## 2020-12-07 NOTE — Progress Notes (Signed)
Provider:  Jacalyn Lefevre, MD  Careteam: Patient Care Team: [name], [name], NP as PCP - General (Internal Medicine) Little Ishikawa, MD as PCP - Cardiology (Cardiology) [name], [name], NP as Nurse Practitioner (Internal Medicine) Marzella Schlein., MD (Ophthalmology) Arminda Resides, MD as Consulting Physician (Dermatology) Arminda Resides, MD as Consulting Physician (Dermatology) Teodora Medici, MD as Consulting Physician (Gynecology)  PLACE OF SERVICE:  [facility]  Advanced Directive information    Allergies  Allergen Reactions   Contrast Media [Iodinated Diagnostic Agents] Hives    Hives during IVP at urology office '02, requires 13 hr prep///a.[email or web address]  Patient came thru ER and had a 1 hour pre-medication and did fine   Oxycodone Other (See Comments)    Decreased appetite.   Boniva [Ibandronic Acid]     Nausea, vomiting, weakness   Codeine Diarrhea and Nausea And Vomiting   Darvocet [Propoxyphene N-Acetaminophen] Diarrhea and Nausea And Vomiting   Erythromycin Nausea And Vomiting   Other Other (See Comments)    Chief Complaint  Patient presents with   Medical Management of Chronic Issues    Patient presents today for a referral placement to a new cardiologist, due to previous doctor moving away.    Acute Visit    Patient presents today for a cough she reports for several months now. She also reports low back pain.     HPI: Patient is a 86 y.o. female Adriana [name] has several concerns today She has been coughing for several months there is no particular time of day or pattern to her cough she is concerned that her blood pressure pill specifically the losartan may be causing the cough.  She has had several relatives who have had cough with what sounds like an ACE inhibitor probably lisinopril. She also complains of some back pain this is present in the morning when she gets up but resolves after she is moved around a little bit.  There no radicular symptoms.  There is no  history of any fall or injury. She also ask whether or not she needs to be followed by cardiologist.  After she had stroke she saw cardiologist probably to rule out some cardiac etiology.  She does have mitral valve prolapse but otherwise seems to have a normal heart. I asked her about her daytime sleepiness she still sleeps a lot in the daytime.  She has early morning wakening.  She tells me that sometimes she just sits and stares suggestive of some apathy and lack of interest.  The symptoms together sound like maybe we could treat her depression better..   Review of Systems:  Review of Systems  Constitutional: Negative.   Respiratory:  Positive for cough.   Cardiovascular: Negative.   Musculoskeletal:  Positive for back pain.  Psychiatric/Behavioral:         Excessive sleeping  All other systems reviewed and are negative.  Past Medical History:  Diagnosis Date   Anginal pain (HCC) 08/24/2010   Echo-EF =>55%,LV normal   Arthritis    Cataract 12/2006   Both eyes  Epes MD   Chest pain, unspecified 09/04/2007   Lexiscan-- EF 72%; LV normal   Constipation    Takes flax seed and honey .  Smooth Move tea.   Depression    Diverticulitis    Head injury, closed    with fall   Hypertension    Mitral valve prolapse    Osteopenia    Stroke (HCC) 09/02/2019   Past Surgical History:  Procedure Laterality Date   ANTERIOR CERVICAL DECOMP/DISCECTOMY FUSION N/A 10/23/2012   Procedure: Cervical Five to Cervical Six, Cervical Six to Cervical Seven anterior cervical decompression with fusion plating and bonegraft;  Surgeon: Hewitt Shorts, MD;  Location: [facility];  Service: Neurosurgery;  Laterality: N/A;  ANTERIOR CERVICAL DECOMPRESSION/DISCECTOMY FUSION 2 LEVELS   BREAST CYST ASPIRATION  1990   benign   CARDIAC CATHETERIZATION  08/01/2010   normal coronaries   COLONOSCOPY  approx 2011   EYE SURGERY Bilateral 2011   intestinal blockage surgery  Feb. 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 She has never used smokeless tobacco. She reports that she does not currently use alcohol after a past usage of about 4.0 standard drinks per week. She reports that she does not use drugs.  Family History  Problem Relation Age of Onset   Hypertension Mother    Transient ischemic attack Mother    Parkinson's disease Mother    Dementia Mother    CVA Mother    Diabetes Father    Coronary artery disease Father    Heart failure Father    Cancer Father        prostate   Depression Father    Congestive Heart Failure Father    Prostate cancer Brother    Diabetes Brother    Prostate cancer Paternal Grandmother     Medications: Patient's Medications  New Prescriptions   No medications on file  Previous Medications   ALENDRONATE (FOSAMAX) 70 MG TABLET    TAKE 1 TAB ONCE A WEEK, AT LEAST 30 MIN BEFORE 1ST FOOD.DO NOT LIE DOWN FOR 30 MIN AFTER TAKING.   ATORVASTATIN (LIPITOR) 20 MG TABLET    TAKE ONE TABLET BY MOUTH ONCE DAILY.   CALCIUM CARB-CHOLECALCIFEROL (CALCIUM 600+D3 PO)    Take 600 mg by mouth daily.    CETIRIZINE (ZYRTEC) 5 MG TABLET    Take 1 tablet (5 mg total) by mouth daily.   CITALOPRAM (CELEXA) 10 MG TABLET    Take 1 tablet (10 mg total) by mouth daily.   CLOPIDOGREL (PLAVIX) 75 MG TABLET    TAKE 1 TABLET ONCE DAILY.   DOCUSATE SODIUM (COLACE) 100 MG CAPSULE    Take 100 mg by mouth at bedtime.   LOSARTAN (COZAAR) 50 MG TABLET    Take 1 tablet (50 mg total) by mouth daily.   METOPROLOL TARTRATE (LOPRESSOR) 25 MG TABLET    TAKE 1 TABLET BY MOUTH TWICE DAILY.   PANTOPRAZOLE (PROTONIX) 40 MG TABLET    TAKE 1 TABLET ONCE DAILY.  Modified Medications   No medications on file  Discontinued Medications   No medications on file    Physical Exam:  Vitals:   12/07/20 1402  Weight: 148 lb 9.6 oz (67.4 kg)  Height: 5\' 6"  (1.676 m)   Body mass index is 23.98 kg/m. Wt Readings from Last 3 Encounters:  12/07/20 148  lb 9.6 oz (67.4 kg)  10/26/20 148 lb 9.6 oz (67.4 kg)  09/21/20 144 lb 3.2 oz (65.4 kg)    Physical Exam Vitals and nursing note reviewed.  Constitutional:      Appearance: Normal appearance.  Cardiovascular:     Rate and Rhythm: Normal rate and regular rhythm.  Pulmonary:     Effort: Pulmonary effort is normal.     Breath sounds: Normal breath sounds.  Neurological:     General: No focal deficit  present.     Mental Status: She is alert and oriented to person, place, and time.  Psychiatric:        Mood and Affect: Mood normal.        Behavior: Behavior normal.    Labs reviewed: Basic Metabolic Panel: Recent Labs    04/19/20 0710 10/04/20 0650  NA 142 142  K 4.1 3.9  CL 105 105  CO2 31 29  GLUCOSE 97 100*  BUN 18 19  CREATININE 0.98* 0.87  CALCIUM 9.4 9.8   Liver Function Tests: Recent Labs    04/19/20 0710 10/04/20 0650  AST 19 22  ALT 13 12  BILITOT 0.4 0.4  PROT 6.8 7.0   No results for input(s): LIPASE, AMYLASE in the last 8760 hours. No results for input(s): AMMONIA in the last 8760 hours. CBC: Recent Labs    04/19/20 0710  WBC 6.0  NEUTROABS 3,726  HGB 13.7  HCT 40.5  MCV 92.5  PLT 223   Lipid Panel: Recent Labs    10/04/20 0650  CHOL 137  HDL 51  LDLCALC 73  TRIG 54  CHOLHDL 2.7   TSH: No results for input(s): TSH in the last 8760 hours. A1C: Lab Results  Component Value Date   HGBA1C 5.3 09/03/2019     Assessment/Plan  1. Depression with anxiety Symptoms point toward depression with excessive sleepiness and apathy.  Would like to increase her dose of citalopram to 20 mg.  We will follow-up with this at next visit in about 6 weeks to see if there is any improvement  2. Primary hypertension Although cough is not as common with an ARB as it is not ACE we will discontinue the losartan and begin amlodipine 2.5 mg.  She will continue metoprolol as before.  I have asked her to monitor the blood pressure if amlodipine 2.5 does not  control it move up to 5 mg   Jacalyn Lefevre, MD [facility] & Adult Medicine [phone]

## 2020-12-09 ENCOUNTER — Telehealth: Payer: Self-pay | Admitting: *Deleted

## 2020-12-09 ENCOUNTER — Other Ambulatory Visit: Payer: Self-pay | Admitting: Nurse Practitioner

## 2020-12-09 MED ORDER — CITALOPRAM HYDROBROMIDE 20 MG PO TABS
20.0000 mg | ORAL_TABLET | Freq: Every day | ORAL | 3 refills | Status: DC
Start: 2020-12-09 — End: 2021-04-19

## 2020-12-09 NOTE — Telephone Encounter (Signed)
Patient called and stated that she saw Dr. Hyacinth Meeker on 10/5 and he was going to increase her Citalopram but the pharmacy never received the Rx.    Reviewed OV Note Dated 12/07/2020: Assessment/Plan   1. Depression with anxiety Symptoms point toward depression with excessive sleepiness and apathy.  Would like to increase her dose of citalopram to 20 mg.  We will follow-up with this at next visit in about 6 weeks to see if there is any improvement      Medication list updated and Rx sent to pharmacy.

## 2020-12-13 DIAGNOSIS — Z85828 Personal history of other malignant neoplasm of skin: Secondary | ICD-10-CM | POA: Diagnosis not present

## 2020-12-13 DIAGNOSIS — D229 Melanocytic nevi, unspecified: Secondary | ICD-10-CM | POA: Diagnosis not present

## 2020-12-13 DIAGNOSIS — L820 Inflamed seborrheic keratosis: Secondary | ICD-10-CM | POA: Diagnosis not present

## 2020-12-13 DIAGNOSIS — L570 Actinic keratosis: Secondary | ICD-10-CM | POA: Diagnosis not present

## 2021-01-10 DIAGNOSIS — L570 Actinic keratosis: Secondary | ICD-10-CM | POA: Diagnosis not present

## 2021-01-10 DIAGNOSIS — Z85828 Personal history of other malignant neoplasm of skin: Secondary | ICD-10-CM | POA: Diagnosis not present

## 2021-01-10 DIAGNOSIS — L821 Other seborrheic keratosis: Secondary | ICD-10-CM | POA: Diagnosis not present

## 2021-01-18 ENCOUNTER — Other Ambulatory Visit: Payer: Self-pay

## 2021-01-18 ENCOUNTER — Non-Acute Institutional Stay: Payer: Medicare PPO | Admitting: Family Medicine

## 2021-01-18 ENCOUNTER — Encounter: Payer: Self-pay | Admitting: Family Medicine

## 2021-01-18 VITALS — BP 130/80 | HR 76 | Temp 96.8°F

## 2021-01-18 DIAGNOSIS — F418 Other specified anxiety disorders: Secondary | ICD-10-CM | POA: Diagnosis not present

## 2021-01-18 DIAGNOSIS — R269 Unspecified abnormalities of gait and mobility: Secondary | ICD-10-CM | POA: Diagnosis not present

## 2021-01-18 DIAGNOSIS — I10 Essential (primary) hypertension: Secondary | ICD-10-CM | POA: Diagnosis not present

## 2021-01-19 NOTE — Progress Notes (Signed)
Provider:  Jacalyn Lefevre, MD  Careteam: Patient Care Team: [name], [name], NP as PCP - General (Internal Medicine) Little Ishikawa, MD as PCP - Cardiology (Cardiology) [name], [name], NP as Nurse Practitioner (Internal Medicine) Marzella Schlein., MD (Ophthalmology) Arminda Resides, MD as Consulting Physician (Dermatology) Arminda Resides, MD as Consulting Physician (Dermatology) Teodora Medici, MD as Consulting Physician (Gynecology)  PLACE OF SERVICE:  [facility]  Advanced Directive information    Allergies  Allergen Reactions   Contrast Media [Iodinated Diagnostic Agents] Hives    Hives during IVP at urology office '02, requires 13 hr prep///a.[email or web address]  Patient came thru ER and had a 1 hour pre-medication and did fine   Oxycodone Other (See Comments)    Decreased appetite.   Boniva [Ibandronic Acid]     Nausea, vomiting, weakness   Codeine Diarrhea and Nausea And Vomiting   Darvocet [Propoxyphene N-Acetaminophen] Diarrhea and Nausea And Vomiting   Erythromycin Nausea And Vomiting   Other Other (See Comments)    Chief Complaint  Patient presents with   Medical Management of Chronic Issues    Patient presents today for a 3 month follow-up.     HPI: Patient is a 86 y.o. female .  Adriana [name] was seen about 1 month ago at which time we discontinued the losartan in favor of him amlodipine.  She has had a chronic cough with the thinking being that maybe they are was contributing.  Cough is no different.  Blood pressures have been controlled well with the combination of metoprolol and now amlodipine. Since her last visit she has gotten a scooter and that has opened up her ability to move about the facility.  She seems very pleased with this addition. She continues to have problems with daytime sleepiness, but I told her it was probably okay to take short nap during the day. Also continues to have some neuropathic type symptoms involving her left leg.  This is the side of her body  which was most affected by her second stroke. Overall she seems to be in a good place.  Depression is being managed with citalopram 20 mg  Review of Systems:  Review of Systems  Constitutional:  Positive for malaise/fatigue.  Respiratory: Negative.    Cardiovascular: Negative.   All other systems reviewed and are negative.  Past Medical History:  Diagnosis Date   Anginal pain (HCC) 08/24/2010   Echo-EF =>55%,LV normal   Arthritis    Cataract 12/2006   Both eyes  Epes MD   Chest pain, unspecified 09/04/2007   Lexiscan-- EF 72%; LV normal   Constipation    Takes flax seed and honey .  Smooth Move tea.   Depression    Diverticulitis    Head injury, closed    with fall   Hypertension    Mitral valve prolapse    Osteopenia    Stroke (HCC) 09/02/2019   Past Surgical History:  Procedure Laterality Date   ANTERIOR CERVICAL DECOMP/DISCECTOMY FUSION N/A 10/23/2012   Procedure: Cervical Five to Cervical Six, Cervical Six to Cervical Seven anterior cervical decompression with fusion plating and bonegraft;  Surgeon: Hewitt Shorts, MD;  Location: [facility];  Service: Neurosurgery;  Laterality: N/A;  ANTERIOR CERVICAL DECOMPRESSION/DISCECTOMY FUSION 2 LEVELS   BREAST CYST ASPIRATION  1990   benign   CARDIAC CATHETERIZATION  08/01/2010   normal coronaries   COLONOSCOPY  approx 2011   EYE SURGERY Bilateral 2011   intestinal blockage surgery  Feb. 2012   "  kink in small intestine" per pt   Social History:   reports that she has never smoked. She has never used smokeless tobacco. She reports that she does not currently use alcohol after a past usage of about 4.0 standard drinks per week. She reports that she does not use drugs.  Family History  Problem Relation Age of Onset   Hypertension Mother    Transient ischemic attack Mother    Parkinson's disease Mother    Dementia Mother    CVA Mother    Diabetes Father    Coronary artery disease Father    Heart failure Father     Cancer Father        prostate   Depression Father    Congestive Heart Failure Father    Prostate cancer Brother    Diabetes Brother    Prostate cancer Paternal Grandmother     Medications: Patient's Medications  New Prescriptions   No medications on file  Previous Medications   ALENDRONATE (FOSAMAX) 70 MG TABLET    TAKE 1 TAB ONCE A WEEK, AT LEAST 30 MIN BEFORE 1ST FOOD.DO NOT LIE DOWN FOR 30 MIN AFTER TAKING.   AMLODIPINE (NORVASC) 2.5 MG TABLET    Take 1 tablet (2.5 mg total) by mouth daily.   ATORVASTATIN (LIPITOR) 20 MG TABLET    TAKE ONE TABLET BY MOUTH ONCE DAILY.   CALCIUM CARB-CHOLECALCIFEROL (CALCIUM 600+D3 PO)    Take 600 mg by mouth daily.    CITALOPRAM (CELEXA) 20 MG TABLET    Take 1 tablet (20 mg total) by mouth daily.   CLOPIDOGREL (PLAVIX) 75 MG TABLET    TAKE 1 TABLET ONCE DAILY.   DOCUSATE SODIUM (COLACE) 100 MG CAPSULE    Take 100 mg by mouth at bedtime.   LOSARTAN (COZAAR) 50 MG TABLET    Take 1 tablet (50 mg total) by mouth daily.   METOPROLOL TARTRATE (LOPRESSOR) 25 MG TABLET    TAKE 1 TABLET BY MOUTH TWICE DAILY.   PANTOPRAZOLE (PROTONIX) 40 MG TABLET    TAKE 1 TABLET ONCE DAILY.  Modified Medications   No medications on file  Discontinued Medications   No medications on file    Physical Exam:  Vitals:   01/18/21 1458  BP: 130/80  Pulse: 76  Temp: (!) 96.8 F (36 C)  SpO2: 98%   There is no height or weight on file to calculate BMI. Wt Readings from Last 3 Encounters:  12/07/20 148 lb 9.6 oz (67.4 kg)  10/26/20 148 lb 9.6 oz (67.4 kg)  09/21/20 144 lb 3.2 oz (65.4 kg)    Physical Exam Vitals and nursing note reviewed.  Constitutional:      Appearance: Normal appearance.  Cardiovascular:     Rate and Rhythm: Normal rate.     Pulses: Normal pulses.  Pulmonary:     Effort: Pulmonary effort is normal.  Neurological:     General: No focal deficit present.     Mental Status: She is alert and oriented to person, place, and time.    Labs  reviewed: Basic Metabolic Panel: Recent Labs    04/19/20 0710 10/04/20 0650  NA 142 142  K 4.1 3.9  CL 105 105  CO2 31 29  GLUCOSE 97 100*  BUN 18 19  CREATININE 0.98* 0.87  CALCIUM 9.4 9.8   Liver Function Tests: Recent Labs    04/19/20 0710 10/04/20 0650  AST 19 22  ALT 13 12  BILITOT 0.4 0.4  PROT 6.8 7.0  No results for input(s): LIPASE, AMYLASE in the last 8760 hours. No results for input(s): AMMONIA in the last 8760 hours. CBC: Recent Labs    04/19/20 0710  WBC 6.0  NEUTROABS 3,726  HGB 13.7  HCT 40.5  MCV 92.5  PLT 223   Lipid Panel: Recent Labs    10/04/20 0650  CHOL 137  HDL 51  LDLCALC 73  TRIG 54  CHOLHDL 2.7   TSH: No results for input(s): TSH in the last 8760 hours. A1C: Lab Results  Component Value Date   HGBA1C 5.3 09/03/2019     Assessment/Plan  1. Depression with anxiety Doing well with current dose of citalopram we will continue  2. Gait abnormality No falls.  Walks around her apartment but when she needs to travel greater distances like to the dining room she now uses her scooter  3. Primary hypertension Blood pressure controlled with metoprolol and low-dose amlodipine   Jacalyn Lefevre, MD [facility] & Adult Medicine [phone]

## 2021-01-23 ENCOUNTER — Other Ambulatory Visit: Payer: Self-pay | Admitting: Nurse Practitioner

## 2021-02-01 ENCOUNTER — Encounter: Payer: Medicare PPO | Admitting: Family Medicine

## 2021-02-07 ENCOUNTER — Other Ambulatory Visit: Payer: Self-pay | Admitting: Nurse Practitioner

## 2021-02-07 DIAGNOSIS — M810 Age-related osteoporosis without current pathological fracture: Secondary | ICD-10-CM

## 2021-02-07 NOTE — Telephone Encounter (Signed)
High risk or very high risk warning populated when attempting to refill medication. RX request sent to PCP for review and approval if warranted.   

## 2021-02-14 DIAGNOSIS — D229 Melanocytic nevi, unspecified: Secondary | ICD-10-CM | POA: Diagnosis not present

## 2021-02-14 DIAGNOSIS — Z85828 Personal history of other malignant neoplasm of skin: Secondary | ICD-10-CM | POA: Diagnosis not present

## 2021-02-14 DIAGNOSIS — L570 Actinic keratosis: Secondary | ICD-10-CM | POA: Diagnosis not present

## 2021-02-22 ENCOUNTER — Other Ambulatory Visit: Payer: Self-pay

## 2021-02-22 ENCOUNTER — Non-Acute Institutional Stay: Payer: Medicare PPO | Admitting: Family Medicine

## 2021-02-22 ENCOUNTER — Encounter: Payer: Self-pay | Admitting: Family Medicine

## 2021-02-22 VITALS — BP 130/80 | HR 52 | Temp 98.0°F | Ht 66.0 in | Wt 150.6 lb

## 2021-02-22 DIAGNOSIS — F418 Other specified anxiety disorders: Secondary | ICD-10-CM | POA: Diagnosis not present

## 2021-02-22 DIAGNOSIS — M79605 Pain in left leg: Secondary | ICD-10-CM

## 2021-02-22 DIAGNOSIS — I10 Essential (primary) hypertension: Secondary | ICD-10-CM | POA: Diagnosis not present

## 2021-02-22 DIAGNOSIS — M79604 Pain in right leg: Secondary | ICD-10-CM | POA: Diagnosis not present

## 2021-02-22 NOTE — Progress Notes (Signed)
Provider:  Jacalyn Lefevre, MD  Careteam: Patient Care Team: [name], [name], NP as PCP - General (Internal Medicine) Little Ishikawa, MD as PCP - Cardiology (Cardiology) [name], [name], NP as Nurse Practitioner (Internal Medicine) Marzella Schlein., MD (Ophthalmology) Arminda Resides, MD as Consulting Physician (Dermatology) Arminda Resides, MD as Consulting Physician (Dermatology) Teodora Medici, MD as Consulting Physician (Gynecology)  PLACE OF SERVICE:  [facility]  Advanced Directive information    Allergies  Allergen Reactions   Contrast Media [Iodinated Diagnostic Agents] Hives    Hives during IVP at urology office '02, requires 13 hr prep///a.calhoun  Patient came thru ER and had a 1 hour pre-medication and did fine   Oxycodone Other (See Comments)    Decreased appetite.   Boniva [Ibandronic Acid]     Nausea, vomiting, weakness   Codeine Diarrhea and Nausea And Vomiting   Darvocet [Propoxyphene N-Acetaminophen] Diarrhea and Nausea And Vomiting   Erythromycin Nausea And Vomiting   Other Other (See Comments)    No chief complaint on file.    HPI: Patient is a 86 y.o. female C/O pain in posterior feet, at night. Now has scooter and uses it to navigate distances but still wlaks her hall with walker. No history injury. Sx improved in daytime  Review of Systems:  Review of Systems  Constitutional: Negative.   HENT: Negative.    Cardiovascular: Negative.   Genitourinary: Negative.   Musculoskeletal:        Bilat lower leg pain  Skin: Negative.   Neurological: Negative.   Psychiatric/Behavioral:  Positive for depression.    Past Medical History:  Diagnosis Date   Anginal pain (HCC) 08/24/2010   Echo-EF =>55%,LV normal   Arthritis    Cataract 12/2006   Both eyes  Epes MD   Chest pain, unspecified 09/04/2007   Lexiscan-- EF 72%; LV normal   Constipation    Takes flax seed and honey .  Smooth Move tea.   Depression    Diverticulitis    Head injury, closed     with fall   Hypertension    Mitral valve prolapse    Osteopenia    Stroke (HCC) 09/02/2019   Past Surgical History:  Procedure Laterality Date   ANTERIOR CERVICAL DECOMP/DISCECTOMY FUSION N/A 10/23/2012   Procedure: Cervical Five to Cervical Six, Cervical Six to Cervical Seven anterior cervical decompression with fusion plating and bonegraft;  Surgeon: Hewitt Shorts, MD;  Location: [facility];  Service: Neurosurgery;  Laterality: N/A;  ANTERIOR CERVICAL DECOMPRESSION/DISCECTOMY FUSION 2 LEVELS   BREAST CYST ASPIRATION  1990   benign   CARDIAC CATHETERIZATION  08/01/2010   normal coronaries   COLONOSCOPY  approx 2011   EYE SURGERY Bilateral 2011   intestinal blockage surgery  Feb. 2012   "kink in small intestine" per pt   Social History:   reports that she has never smoked. She has never used smokeless tobacco. She reports that she does not currently use alcohol after a past usage of about 4.0 standard drinks per week. She reports that she does not use drugs.  Family History  Problem Relation Age of Onset   Hypertension Mother    Transient ischemic attack Mother    Parkinson's disease Mother    Dementia Mother    CVA Mother    Diabetes Father    Coronary artery disease Father    Heart failure Father    Cancer Father        prostate  Depression Father    Congestive Heart Failure Father    Prostate cancer Brother    Diabetes Brother    Prostate cancer Paternal Grandmother     Medications: Patient's Medications  New Prescriptions   No medications on file  Previous Medications   ALENDRONATE (FOSAMAX) 70 MG TABLET    TAKE 1 TAB ONCE A WEEK, AT LEAST 30 MIN BEFORE 1ST FOOD.DO NOT LIE DOWN FOR 30 MIN AFTER TAKING.   AMLODIPINE (NORVASC) 2.5 MG TABLET    Take 1 tablet (2.5 mg total) by mouth daily.   ATORVASTATIN (LIPITOR) 20 MG TABLET    TAKE ONE TABLET BY MOUTH ONCE DAILY.   CALCIUM CARB-CHOLECALCIFEROL (CALCIUM 600+D3 PO)    Take 600 mg by mouth daily.     CITALOPRAM (CELEXA) 20 MG TABLET    Take 1 tablet (20 mg total) by mouth daily.   CLOPIDOGREL (PLAVIX) 75 MG TABLET    TAKE 1 TABLET ONCE DAILY.   DOCUSATE SODIUM (COLACE) 100 MG CAPSULE    Take 100 mg by mouth at bedtime.   LOSARTAN (COZAAR) 50 MG TABLET    Take 1 tablet (50 mg total) by mouth daily.   METOPROLOL TARTRATE (LOPRESSOR) 25 MG TABLET    TAKE 1 TABLET BY MOUTH TWICE DAILY.   PANTOPRAZOLE (PROTONIX) 40 MG TABLET    TAKE 1 TABLET ONCE DAILY.  Modified Medications   No medications on file  Discontinued Medications   No medications on file    Physical Exam:  Vitals:   02/22/21 1400  Weight: 150 lb 9.6 oz (68.3 kg)  Height: 5\' 6"  (1.676 m)   Body mass index is 24.31 kg/m. Wt Readings from Last 3 Encounters:  02/22/21 150 lb 9.6 oz (68.3 kg)  12/07/20 148 lb 9.6 oz (67.4 kg)  10/26/20 148 lb 9.6 oz (67.4 kg)    Physical Exam Vitals reviewed.  Cardiovascular:     Rate and Rhythm: Normal rate and regular rhythm.  Pulmonary:     Effort: Pulmonary effort is normal.     Breath sounds: Normal breath sounds.  Genitourinary:    Rectum: Guaiac result positive.  Musculoskeletal:     Comments: Mildly tender with compression of Achilles Pain with dorsiflexion Nl sensation to monofilament but decreased vibratory sensation  Skin:    General: Skin is warm.  Neurological:     General: No focal deficit present.     Mental Status: She is alert.    Labs reviewed: Basic Metabolic Panel: Recent Labs    04/19/20 0710 10/04/20 0650  NA 142 142  K 4.1 3.9  CL 105 105  CO2 31 29  GLUCOSE 97 100*  BUN 18 19  CREATININE 0.98* 0.87  CALCIUM 9.4 9.8   Liver Function Tests: Recent Labs    04/19/20 0710 10/04/20 0650  AST 19 22  ALT 13 12  BILITOT 0.4 0.4  PROT 6.8 7.0   No results for input(s): LIPASE, AMYLASE in the last 8760 hours. No results for input(s): AMMONIA in the last 8760 hours. CBC: Recent Labs    04/19/20 0710  WBC 6.0  NEUTROABS 3,726  HGB 13.7   HCT 40.5  MCV 92.5  PLT 223   Lipid Panel: Recent Labs    10/04/20 0650  CHOL 137  HDL 51  LDLCALC 73  TRIG 54  CHOLHDL 2.7   TSH: No results for input(s): TSH in the last 8760 hours. A1C: Lab Results  Component Value Date   HGBA1C 5.3 09/03/2019  Assessment/Plan  1. Leg pain, bilateral Pain is mild; probably related to sleeping position. No circulatory issue as pulses okay. S/P CVA affecting L side but these sx are bilateral   2. Primary hypertension BP controlled on Amlodipine and metoprolol  3. Depression with anxiety Still has some sadness intermittently but over-all seems much brighter and accepting of her "condition"  Jacalyn Lefevre, MD [facility] & Adult Medicine [phone]

## 2021-02-23 DIAGNOSIS — M79604 Pain in right leg: Secondary | ICD-10-CM | POA: Insufficient documentation

## 2021-03-28 DIAGNOSIS — M25672 Stiffness of left ankle, not elsewhere classified: Secondary | ICD-10-CM | POA: Diagnosis not present

## 2021-03-28 DIAGNOSIS — M6281 Muscle weakness (generalized): Secondary | ICD-10-CM | POA: Diagnosis not present

## 2021-03-28 DIAGNOSIS — M25671 Stiffness of right ankle, not elsewhere classified: Secondary | ICD-10-CM | POA: Diagnosis not present

## 2021-03-28 DIAGNOSIS — M25571 Pain in right ankle and joints of right foot: Secondary | ICD-10-CM | POA: Diagnosis not present

## 2021-03-28 DIAGNOSIS — M25572 Pain in left ankle and joints of left foot: Secondary | ICD-10-CM | POA: Diagnosis not present

## 2021-03-31 DIAGNOSIS — M25671 Stiffness of right ankle, not elsewhere classified: Secondary | ICD-10-CM | POA: Diagnosis not present

## 2021-03-31 DIAGNOSIS — M25672 Stiffness of left ankle, not elsewhere classified: Secondary | ICD-10-CM | POA: Diagnosis not present

## 2021-03-31 DIAGNOSIS — M6281 Muscle weakness (generalized): Secondary | ICD-10-CM | POA: Diagnosis not present

## 2021-03-31 DIAGNOSIS — M25572 Pain in left ankle and joints of left foot: Secondary | ICD-10-CM | POA: Diagnosis not present

## 2021-03-31 DIAGNOSIS — M25571 Pain in right ankle and joints of right foot: Secondary | ICD-10-CM | POA: Diagnosis not present

## 2021-04-03 DIAGNOSIS — M25671 Stiffness of right ankle, not elsewhere classified: Secondary | ICD-10-CM | POA: Diagnosis not present

## 2021-04-03 DIAGNOSIS — M25672 Stiffness of left ankle, not elsewhere classified: Secondary | ICD-10-CM | POA: Diagnosis not present

## 2021-04-03 DIAGNOSIS — M25571 Pain in right ankle and joints of right foot: Secondary | ICD-10-CM | POA: Diagnosis not present

## 2021-04-03 DIAGNOSIS — M25572 Pain in left ankle and joints of left foot: Secondary | ICD-10-CM | POA: Diagnosis not present

## 2021-04-03 DIAGNOSIS — M6281 Muscle weakness (generalized): Secondary | ICD-10-CM | POA: Diagnosis not present

## 2021-04-05 DIAGNOSIS — M25671 Stiffness of right ankle, not elsewhere classified: Secondary | ICD-10-CM | POA: Diagnosis not present

## 2021-04-05 DIAGNOSIS — M25571 Pain in right ankle and joints of right foot: Secondary | ICD-10-CM | POA: Diagnosis not present

## 2021-04-05 DIAGNOSIS — M25672 Stiffness of left ankle, not elsewhere classified: Secondary | ICD-10-CM | POA: Diagnosis not present

## 2021-04-05 DIAGNOSIS — M25572 Pain in left ankle and joints of left foot: Secondary | ICD-10-CM | POA: Diagnosis not present

## 2021-04-05 DIAGNOSIS — M6281 Muscle weakness (generalized): Secondary | ICD-10-CM | POA: Diagnosis not present

## 2021-04-07 DIAGNOSIS — M25572 Pain in left ankle and joints of left foot: Secondary | ICD-10-CM | POA: Diagnosis not present

## 2021-04-07 DIAGNOSIS — M6281 Muscle weakness (generalized): Secondary | ICD-10-CM | POA: Diagnosis not present

## 2021-04-07 DIAGNOSIS — M25671 Stiffness of right ankle, not elsewhere classified: Secondary | ICD-10-CM | POA: Diagnosis not present

## 2021-04-07 DIAGNOSIS — M25672 Stiffness of left ankle, not elsewhere classified: Secondary | ICD-10-CM | POA: Diagnosis not present

## 2021-04-07 DIAGNOSIS — M25571 Pain in right ankle and joints of right foot: Secondary | ICD-10-CM | POA: Diagnosis not present

## 2021-04-12 DIAGNOSIS — M6281 Muscle weakness (generalized): Secondary | ICD-10-CM | POA: Diagnosis not present

## 2021-04-12 DIAGNOSIS — M25671 Stiffness of right ankle, not elsewhere classified: Secondary | ICD-10-CM | POA: Diagnosis not present

## 2021-04-12 DIAGNOSIS — M25571 Pain in right ankle and joints of right foot: Secondary | ICD-10-CM | POA: Diagnosis not present

## 2021-04-12 DIAGNOSIS — M25572 Pain in left ankle and joints of left foot: Secondary | ICD-10-CM | POA: Diagnosis not present

## 2021-04-12 DIAGNOSIS — M25672 Stiffness of left ankle, not elsewhere classified: Secondary | ICD-10-CM | POA: Diagnosis not present

## 2021-04-14 DIAGNOSIS — M25572 Pain in left ankle and joints of left foot: Secondary | ICD-10-CM | POA: Diagnosis not present

## 2021-04-14 DIAGNOSIS — M25571 Pain in right ankle and joints of right foot: Secondary | ICD-10-CM | POA: Diagnosis not present

## 2021-04-14 DIAGNOSIS — M25671 Stiffness of right ankle, not elsewhere classified: Secondary | ICD-10-CM | POA: Diagnosis not present

## 2021-04-14 DIAGNOSIS — M25672 Stiffness of left ankle, not elsewhere classified: Secondary | ICD-10-CM | POA: Diagnosis not present

## 2021-04-14 DIAGNOSIS — M6281 Muscle weakness (generalized): Secondary | ICD-10-CM | POA: Diagnosis not present

## 2021-04-18 DIAGNOSIS — L602 Onychogryphosis: Secondary | ICD-10-CM | POA: Diagnosis not present

## 2021-04-19 ENCOUNTER — Other Ambulatory Visit: Payer: Self-pay | Admitting: Family Medicine

## 2021-04-19 DIAGNOSIS — M25572 Pain in left ankle and joints of left foot: Secondary | ICD-10-CM | POA: Diagnosis not present

## 2021-04-19 DIAGNOSIS — M25671 Stiffness of right ankle, not elsewhere classified: Secondary | ICD-10-CM | POA: Diagnosis not present

## 2021-04-19 DIAGNOSIS — M25571 Pain in right ankle and joints of right foot: Secondary | ICD-10-CM | POA: Diagnosis not present

## 2021-04-19 DIAGNOSIS — M25672 Stiffness of left ankle, not elsewhere classified: Secondary | ICD-10-CM | POA: Diagnosis not present

## 2021-04-19 DIAGNOSIS — M6281 Muscle weakness (generalized): Secondary | ICD-10-CM | POA: Diagnosis not present

## 2021-04-21 DIAGNOSIS — M25572 Pain in left ankle and joints of left foot: Secondary | ICD-10-CM | POA: Diagnosis not present

## 2021-04-21 DIAGNOSIS — M25671 Stiffness of right ankle, not elsewhere classified: Secondary | ICD-10-CM | POA: Diagnosis not present

## 2021-04-21 DIAGNOSIS — M6281 Muscle weakness (generalized): Secondary | ICD-10-CM | POA: Diagnosis not present

## 2021-04-21 DIAGNOSIS — M25571 Pain in right ankle and joints of right foot: Secondary | ICD-10-CM | POA: Diagnosis not present

## 2021-04-21 DIAGNOSIS — M25672 Stiffness of left ankle, not elsewhere classified: Secondary | ICD-10-CM | POA: Diagnosis not present

## 2021-04-26 DIAGNOSIS — M25672 Stiffness of left ankle, not elsewhere classified: Secondary | ICD-10-CM | POA: Diagnosis not present

## 2021-04-26 DIAGNOSIS — M25571 Pain in right ankle and joints of right foot: Secondary | ICD-10-CM | POA: Diagnosis not present

## 2021-04-26 DIAGNOSIS — M25671 Stiffness of right ankle, not elsewhere classified: Secondary | ICD-10-CM | POA: Diagnosis not present

## 2021-04-26 DIAGNOSIS — M25572 Pain in left ankle and joints of left foot: Secondary | ICD-10-CM | POA: Diagnosis not present

## 2021-04-26 DIAGNOSIS — M6281 Muscle weakness (generalized): Secondary | ICD-10-CM | POA: Diagnosis not present

## 2021-04-28 DIAGNOSIS — M25572 Pain in left ankle and joints of left foot: Secondary | ICD-10-CM | POA: Diagnosis not present

## 2021-04-28 DIAGNOSIS — M25571 Pain in right ankle and joints of right foot: Secondary | ICD-10-CM | POA: Diagnosis not present

## 2021-04-28 DIAGNOSIS — M25671 Stiffness of right ankle, not elsewhere classified: Secondary | ICD-10-CM | POA: Diagnosis not present

## 2021-04-28 DIAGNOSIS — M6281 Muscle weakness (generalized): Secondary | ICD-10-CM | POA: Diagnosis not present

## 2021-04-28 DIAGNOSIS — M25672 Stiffness of left ankle, not elsewhere classified: Secondary | ICD-10-CM | POA: Diagnosis not present

## 2021-05-05 DIAGNOSIS — M6281 Muscle weakness (generalized): Secondary | ICD-10-CM | POA: Diagnosis not present

## 2021-05-05 DIAGNOSIS — M25672 Stiffness of left ankle, not elsewhere classified: Secondary | ICD-10-CM | POA: Diagnosis not present

## 2021-05-05 DIAGNOSIS — M25572 Pain in left ankle and joints of left foot: Secondary | ICD-10-CM | POA: Diagnosis not present

## 2021-05-05 DIAGNOSIS — M25571 Pain in right ankle and joints of right foot: Secondary | ICD-10-CM | POA: Diagnosis not present

## 2021-05-05 DIAGNOSIS — M25671 Stiffness of right ankle, not elsewhere classified: Secondary | ICD-10-CM | POA: Diagnosis not present

## 2021-05-16 ENCOUNTER — Other Ambulatory Visit: Payer: Self-pay | Admitting: Family Medicine

## 2021-05-16 DIAGNOSIS — Z85828 Personal history of other malignant neoplasm of skin: Secondary | ICD-10-CM | POA: Diagnosis not present

## 2021-05-16 DIAGNOSIS — L570 Actinic keratosis: Secondary | ICD-10-CM | POA: Diagnosis not present

## 2021-05-16 DIAGNOSIS — L814 Other melanin hyperpigmentation: Secondary | ICD-10-CM | POA: Diagnosis not present

## 2021-05-16 DIAGNOSIS — L820 Inflamed seborrheic keratosis: Secondary | ICD-10-CM | POA: Diagnosis not present

## 2021-05-17 ENCOUNTER — Encounter: Payer: Self-pay | Admitting: Family Medicine

## 2021-05-17 ENCOUNTER — Non-Acute Institutional Stay (INDEPENDENT_AMBULATORY_CARE_PROVIDER_SITE_OTHER): Payer: Medicare PPO | Admitting: Family Medicine

## 2021-05-17 ENCOUNTER — Other Ambulatory Visit: Payer: Self-pay

## 2021-05-17 VITALS — BP 130/70 | HR 63 | Temp 97.7°F | Ht 66.0 in | Wt 152.3 lb

## 2021-05-17 DIAGNOSIS — I10 Essential (primary) hypertension: Secondary | ICD-10-CM

## 2021-05-17 DIAGNOSIS — I5032 Chronic diastolic (congestive) heart failure: Secondary | ICD-10-CM

## 2021-05-17 DIAGNOSIS — F418 Other specified anxiety disorders: Secondary | ICD-10-CM

## 2021-05-18 NOTE — Progress Notes (Signed)
? ? ?Provider:  ?Jacalyn Lefevre, MD ? ?Careteam: ?Patient Care Team: ?[name], [name], NP as PCP - General (Internal Medicine) ?Little Ishikawa, MD as PCP - Cardiology (Cardiology) ?[name], [name], NP as Nurse Practitioner (Internal Medicine) ?Marzella Schlein., MD (Ophthalmology) ?Arminda Resides, MD as Consulting Physician (Dermatology) ?Arminda Resides, MD as Consulting Physician (Dermatology) ?Mezer, Dimas Aguas, MD as Consulting Physician (Gynecology) ? ?PLACE OF SERVICE:  ?[facility]  ?Advanced Directive information ?  ? ?Allergies  ?Allergen Reactions  ? Contrast Media [Iodinated Contrast Media] Hives  ?  Hives during IVP at urology office '02, requires 13 hr prep///a.calhoun ? ?Patient came thru ER and had a 1 hour pre-medication and did fine  ? Oxycodone Other (See Comments)  ?  Decreased appetite.  ? Boniva [Ibandronic Acid]   ?  Nausea, vomiting, weakness  ? Codeine Diarrhea and Nausea And Vomiting  ? Darvocet [Propoxyphene N-Acetaminophen] Diarrhea and Nausea And Vomiting  ? Erythromycin Nausea And Vomiting  ? Other Other (See Comments)  ? ? ?Chief Complaint  ?Patient presents with  ? Medical Management of Chronic Issues  ?  Patient presents today for a 4 month follow-up.  ? Quality Metric Gaps  ?  TDAP and COVID booster  ? ? ? ?HPI: Patient is a 87 y.o. female .  This nice lady is here for routine management of chronic medical problems including depression leg and foot pain and dependent edema.  Since her last visit she has some concerns about her depression.  She describes to situational issues within her family that have her concerned but she does not feel like her underlying depression is any worse as a result of those items. ?She is seeing physical therapy in the facility for pain in her posterior feet as well as calf.  They have her doing some stretching.  She is using Tylenol Extra Strength at bedtime which helps her rest.  Pain seems to be worse at night which is true with many aches and pains.  Once she  is up and moving around pain improves. ?She also complains of some mild edema in her feet.  Reviewing her medicines she is on low-dose amlodipine but she also takes losartan as well as metoprolol for blood pressure and pressures have been good. ?She continues to walk on her hall but is using her scooter more and more to travel distances within the facility. ? ?Review of Systems:  ?Review of Systems  ?Constitutional: Negative.   ?HENT: Negative.    ?Respiratory: Negative.    ?Cardiovascular:  Positive for leg swelling.  ?Genitourinary: Negative.   ?Skin: Negative.   ?Neurological: Negative.   ?All other systems reviewed and are negative. ? ?Past Medical History:  ?Diagnosis Date  ? Anginal pain (HCC) 08/24/2010  ? Echo-EF =>55%,LV normal  ? Arthritis   ? Cataract 12/2006  ? Both eyes  Epes MD  ? Chest pain, unspecified 09/04/2007  ? Lexiscan-- EF 72%; LV normal  ? Constipation   ? Takes flax seed and honey .  Smooth Move tea.  ? Depression   ? Diverticulitis   ? Head injury, closed   ? with fall  ? Hypertension   ? Mitral valve prolapse   ? Osteopenia   ? Stroke (HCC) 09/02/2019  ? ?Past Surgical History:  ?Procedure Laterality Date  ? ANTERIOR CERVICAL DECOMP/DISCECTOMY FUSION N/A 10/23/2012  ? Procedure: Cervical Five to Cervical Six, Cervical Six to Cervical Seven anterior cervical decompression with fusion plating and bonegraft;  Surgeon: Molly Maduro  Hillery JacksW Nudelman, MD;  Location: [facility];  Service: Neurosurgery;  Laterality: N/A;  ANTERIOR CERVICAL DECOMPRESSION/DISCECTOMY FUSION 2 LEVELS  ? BREAST CYST ASPIRATION  1990  ? benign  ? CARDIAC CATHETERIZATION  08/01/2010  ? normal coronaries  ? COLONOSCOPY  approx 2011  ? EYE SURGERY Bilateral 2011  ? intestinal blockage surgery  Feb. 2012  ? "kink in small intestine" per pt  ? ?Social History: ?  reports that she has never smoked. She has never used smokeless tobacco. She reports that she does not currently use alcohol after a past usage of about 4.0 standard drinks  per week. She reports that she does not use drugs. ? ?Family History  ?Problem Relation Age of Onset  ? Hypertension Mother   ? Transient ischemic attack Mother   ? Parkinson's disease Mother   ? Dementia Mother   ? CVA Mother   ? Diabetes Father   ? Coronary artery disease Father   ? Heart failure Father   ? Cancer Father   ?     prostate  ? Depression Father   ? Congestive Heart Failure Father   ? Prostate cancer Brother   ? Diabetes Brother   ? Prostate cancer Paternal Grandmother   ? ? ?Medications: ?Patient's Medications  ?New Prescriptions  ? No medications on file  ?Previous Medications  ? ALENDRONATE (FOSAMAX) 70 MG TABLET    TAKE 1 TAB ONCE A WEEK, AT LEAST 30 MIN BEFORE 1ST FOOD.DO NOT LIE DOWN FOR 30 MIN AFTER TAKING.  ? AMLODIPINE (NORVASC) 2.5 MG TABLET    Take 1 tablet (2.5 mg total) by mouth daily.  ? ATORVASTATIN (LIPITOR) 20 MG TABLET    TAKE ONE TABLET BY MOUTH ONCE DAILY.  ? CALCIUM CARB-CHOLECALCIFEROL (CALCIUM 600+D3 PO)    Take 600 mg by mouth daily.   ? CITALOPRAM (CELEXA) 20 MG TABLET    Take 1 tablet (20 mg total) by mouth daily.  ? CLOPIDOGREL (PLAVIX) 75 MG TABLET    TAKE 1 TABLET ONCE DAILY.  ? DOCUSATE SODIUM (COLACE) 100 MG CAPSULE    Take 100 mg by mouth at bedtime.  ? LOSARTAN (COZAAR) 50 MG TABLET    Take 1 tablet (50 mg total) by mouth daily.  ? METOPROLOL TARTRATE (LOPRESSOR) 25 MG TABLET    TAKE 1 TABLET BY MOUTH TWICE DAILY.  ? PANTOPRAZOLE (PROTONIX) 40 MG TABLET    TAKE 1 TABLET ONCE DAILY.  ?Modified Medications  ? No medications on file  ?Discontinued Medications  ? No medications on file  ? ? ?Physical Exam: ? ?Vitals:  ? 05/17/21 1405  ?BP: 130/70  ?Pulse: 63  ?Temp: 97.7 ?F (36.5 ?C)  ?SpO2: 99%  ?Weight: 152 lb 4.8 oz (69.1 kg)  ?Height: 5\' 6"  (1.676 m)  ? ?Body mass index is 24.58 kg/m?. ?Wt Readings from Last 3 Encounters:  ?05/17/21 152 lb 4.8 oz (69.1 kg)  ?02/22/21 150 lb 9.6 oz (68.3 kg)  ?12/07/20 148 lb 9.6 oz (67.4 kg)  ? ? ?Physical Exam ?Vitals and nursing note  reviewed.  ?Constitutional:   ?   Appearance: Normal appearance.  ?Cardiovascular:  ?   Rate and Rhythm: Normal rate and regular rhythm.  ?Pulmonary:  ?   Effort: Pulmonary effort is normal.  ?   Breath sounds: Normal breath sounds.  ?Musculoskeletal:  ?   Comments: Mild tenderness on compression of the left Achilles tendon  ?Neurological:  ?   General: No focal deficit present.  ?  Mental Status: She is alert and oriented to person, place, and time.  ? ? ?Labs reviewed: ?Basic Metabolic Panel: ?Recent Labs  ?  10/04/20 ?0650  ?NA 142  ?K 3.9  ?CL 105  ?CO2 29  ?GLUCOSE 100*  ?BUN 19  ?CREATININE 0.87  ?CALCIUM 9.8  ? ?Liver Function Tests: ?Recent Labs  ?  10/04/20 ?0650  ?AST 22  ?ALT 12  ?BILITOT 0.4  ?PROT 7.0  ? ?No results for input(s): LIPASE, AMYLASE in the last 8760 hours. ?No results for input(s): AMMONIA in the last 8760 hours. ?CBC: ?No results for input(s): WBC, NEUTROABS, HGB, HCT, MCV, PLT in the last 8760 hours. ?Lipid Panel: ?Recent Labs  ?  10/04/20 ?0650  ?CHOL 137  ?HDL 51  ?LDLCALC 73  ?TRIG 54  ?CHOLHDL 2.7  ? ?TSH: ?No results for input(s): TSH in the last 8760 hours. ?A1C: ?Lab Results  ?Component Value Date  ? HGBA1C 5.3 09/03/2019  ? ? ? ?Assessment/Plan ?1. Depression with anxiety ?Citalopram continues to be effective.  We will continue with same dose ? ?2. Chronic diastolic congestive heart failure (HCC) ?I think any failure is well compensated.  Even though she is on low-dose amlodipine I would like to discontinue that and monitor the pressure thinking that that might be contributing to her mild edema.  Blood pressure should be covered with losartan and metoprolol that she already takes ? ?3. Primary hypertension ?Blood pressure well controlled.  We will try the change as noted above and monitor ? ? ? ?Jacalyn Lefevre, MD ?[facility] ?[phone]  ? ?

## 2021-05-31 DIAGNOSIS — F329 Major depressive disorder, single episode, unspecified: Secondary | ICD-10-CM | POA: Diagnosis not present

## 2021-05-31 DIAGNOSIS — I10 Essential (primary) hypertension: Secondary | ICD-10-CM | POA: Diagnosis not present

## 2021-05-31 DIAGNOSIS — K5900 Constipation, unspecified: Secondary | ICD-10-CM | POA: Diagnosis not present

## 2021-05-31 DIAGNOSIS — E785 Hyperlipidemia, unspecified: Secondary | ICD-10-CM | POA: Diagnosis not present

## 2021-05-31 DIAGNOSIS — R32 Unspecified urinary incontinence: Secondary | ICD-10-CM | POA: Diagnosis not present

## 2021-05-31 DIAGNOSIS — M810 Age-related osteoporosis without current pathological fracture: Secondary | ICD-10-CM | POA: Diagnosis not present

## 2021-05-31 DIAGNOSIS — M1990 Unspecified osteoarthritis, unspecified site: Secondary | ICD-10-CM | POA: Diagnosis not present

## 2021-05-31 DIAGNOSIS — K219 Gastro-esophageal reflux disease without esophagitis: Secondary | ICD-10-CM | POA: Diagnosis not present

## 2021-05-31 DIAGNOSIS — H547 Unspecified visual loss: Secondary | ICD-10-CM | POA: Diagnosis not present

## 2021-06-06 DIAGNOSIS — H53461 Homonymous bilateral field defects, right side: Secondary | ICD-10-CM | POA: Diagnosis not present

## 2021-06-06 DIAGNOSIS — Z961 Presence of intraocular lens: Secondary | ICD-10-CM | POA: Diagnosis not present

## 2021-06-06 DIAGNOSIS — H43813 Vitreous degeneration, bilateral: Secondary | ICD-10-CM | POA: Diagnosis not present

## 2021-06-15 ENCOUNTER — Other Ambulatory Visit: Payer: Self-pay | Admitting: Nurse Practitioner

## 2021-07-11 DIAGNOSIS — L570 Actinic keratosis: Secondary | ICD-10-CM | POA: Diagnosis not present

## 2021-07-11 DIAGNOSIS — D229 Melanocytic nevi, unspecified: Secondary | ICD-10-CM | POA: Diagnosis not present

## 2021-07-11 DIAGNOSIS — L814 Other melanin hyperpigmentation: Secondary | ICD-10-CM | POA: Diagnosis not present

## 2021-07-11 DIAGNOSIS — Z85828 Personal history of other malignant neoplasm of skin: Secondary | ICD-10-CM | POA: Diagnosis not present

## 2021-07-11 DIAGNOSIS — L821 Other seborrheic keratosis: Secondary | ICD-10-CM | POA: Diagnosis not present

## 2021-09-24 IMAGING — MR MR HEAD W/O CM
2 series · 7 of 48 positions shown · non-contrast
Comparison: 12/03/2018

CLINICAL DATA: Fall

EXAM:
MRI HEAD WITHOUT CONTRAST
TECHNIQUE: Multiplanar, multiecho pulse sequences of the brain and surrounding
structures were obtained without intravenous contrast.

[Series 5: FLAIR · sagittal · 5.0mm · 0.23mm/px · 4 of 23 slices shown (1 of 2)]
[im 2/23]
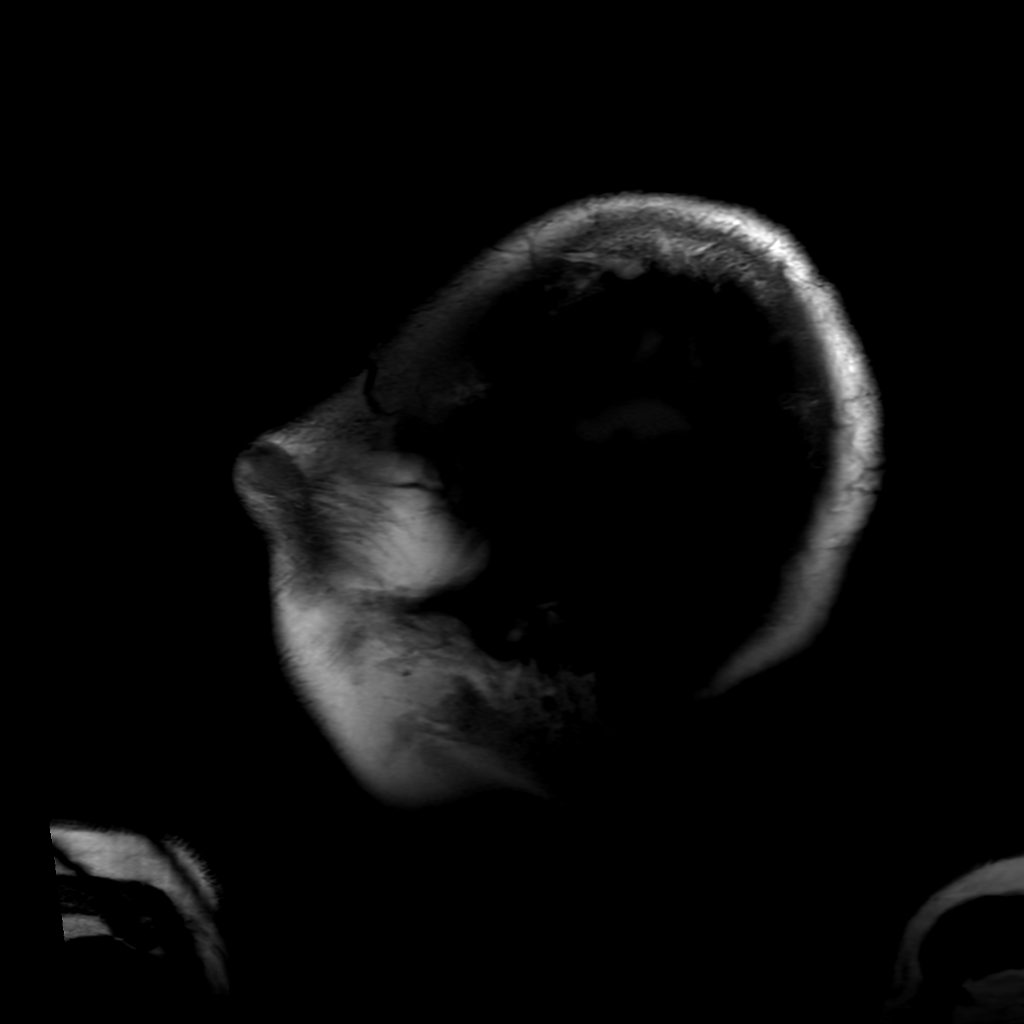
[im 4/23]
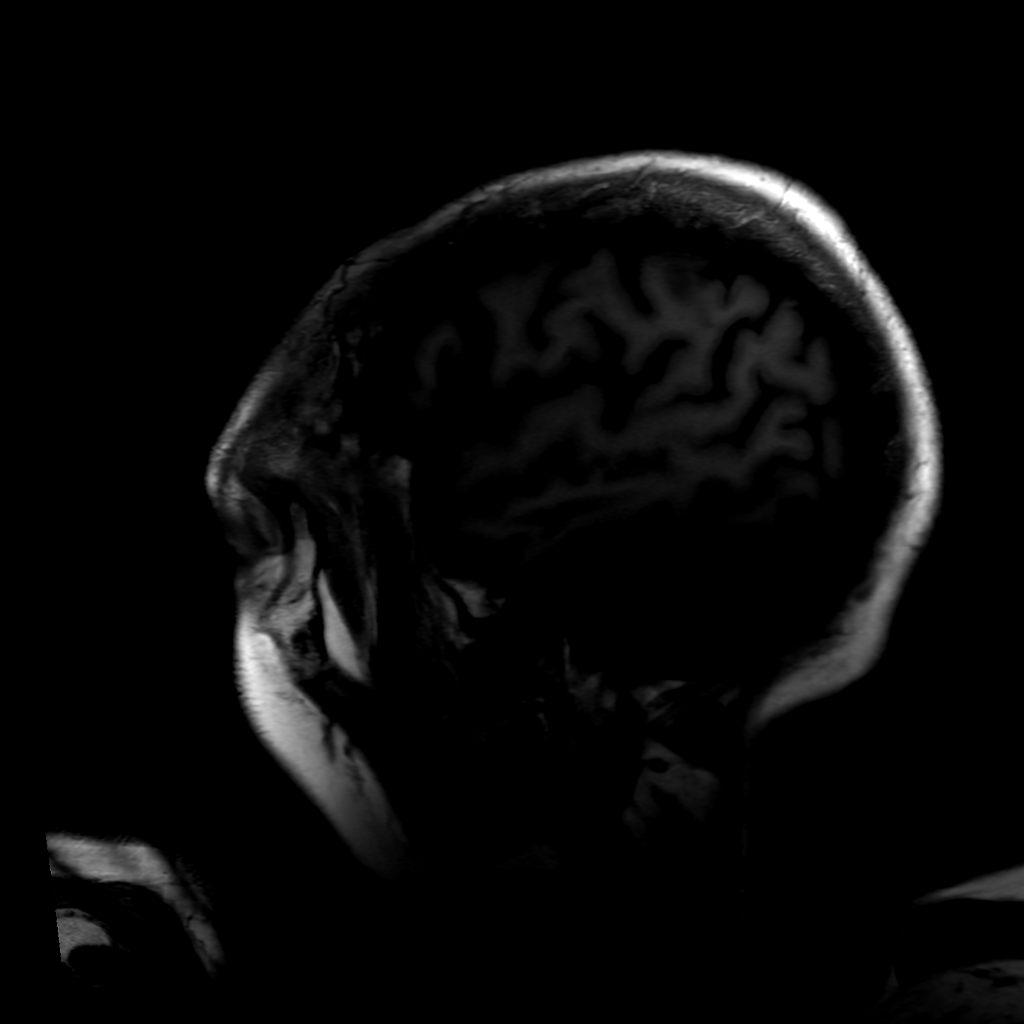
[im 12/23]
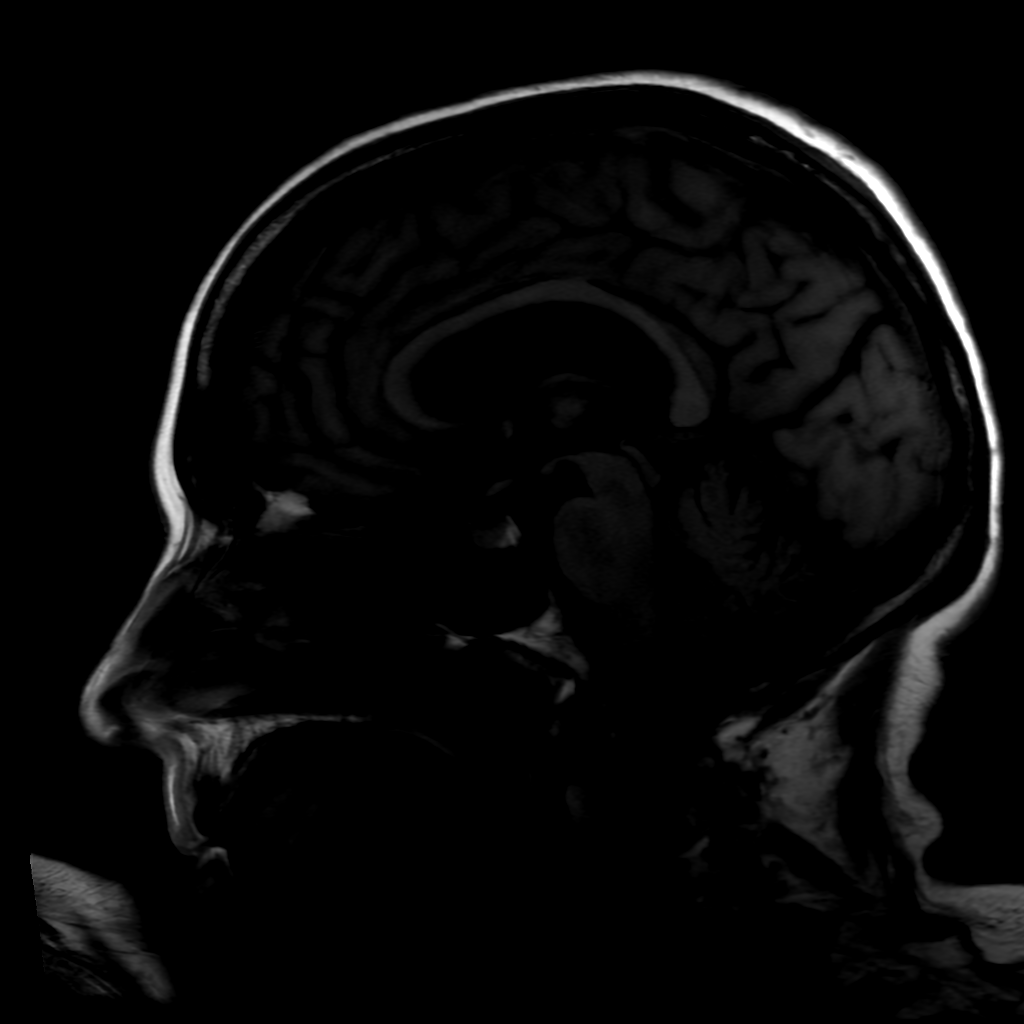
[im 20/23]
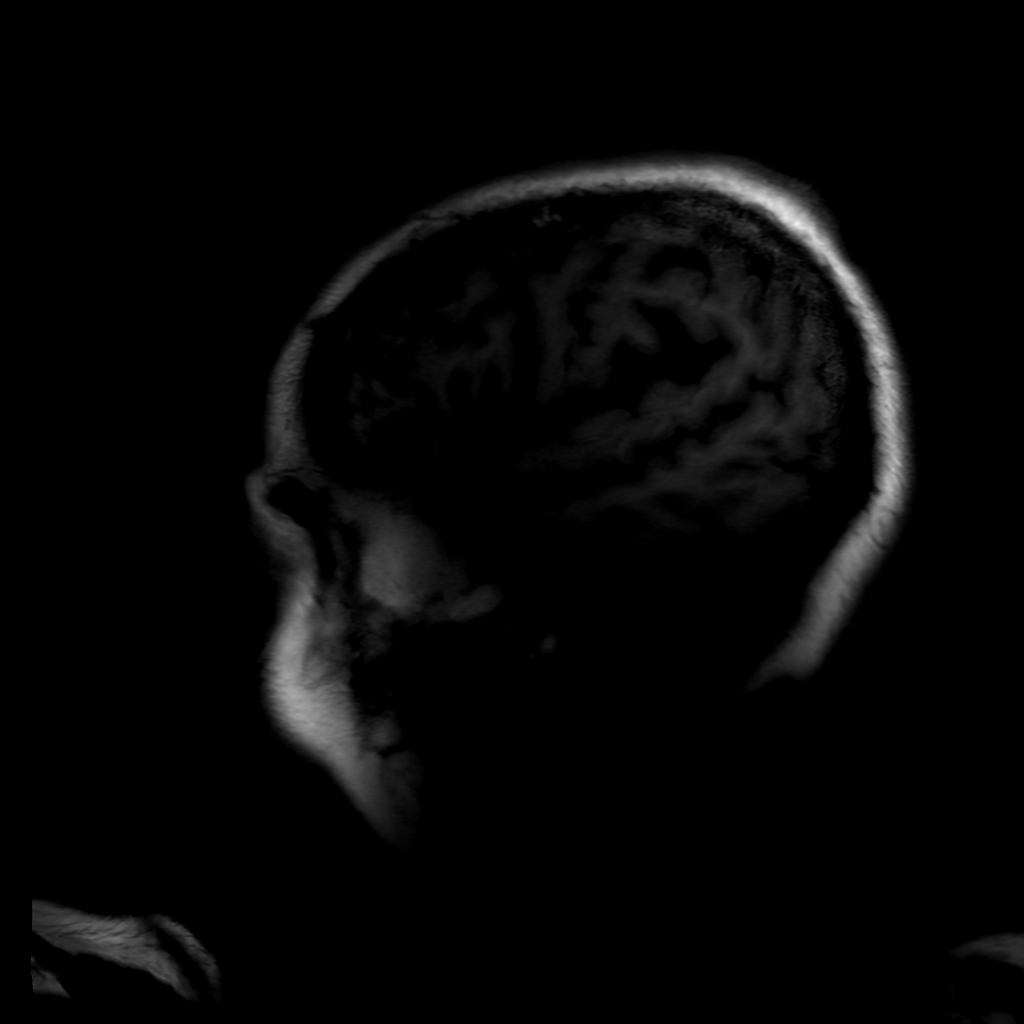

[Series 7: FLAIR · axial · 3.0mm · 0.47mm/px · z∈[-95,-2]mm · 3 of 26 slices shown (2 of 2)]
[im 5/26]
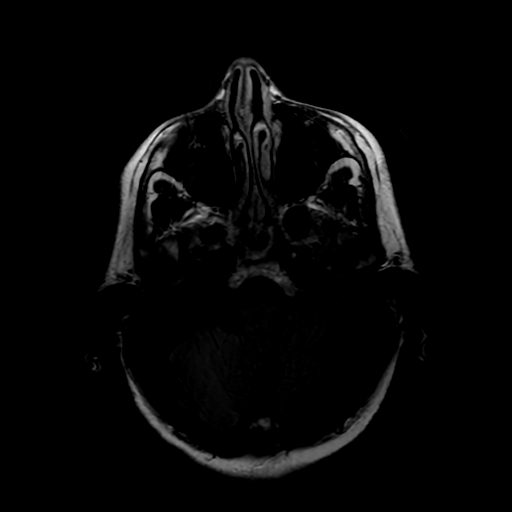
[im 13/26]
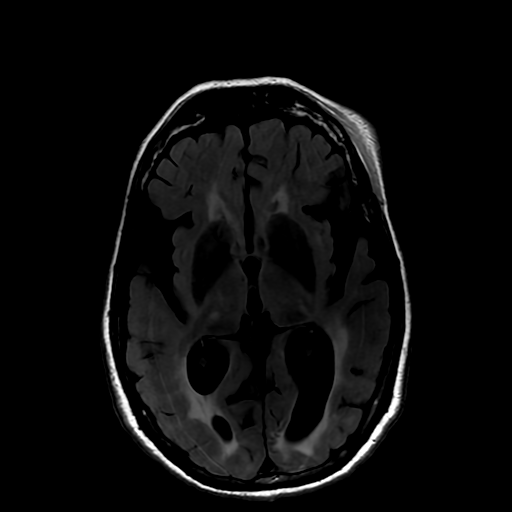
[im 21/26]
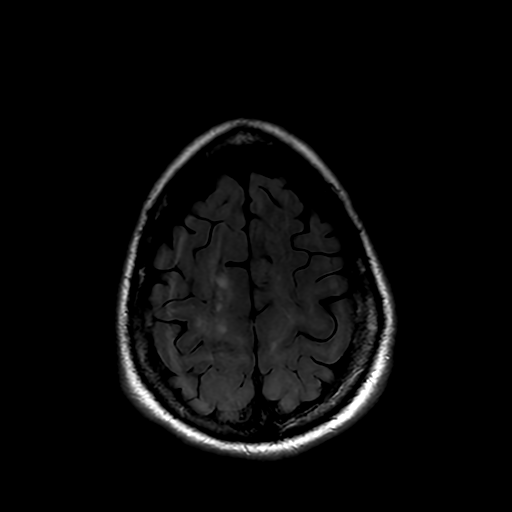

[7 of 48 positions shown; findings below may reference images not displayed]

FINDINGS: Brain: Small subcentimeter focus of mildly reduced diffusion is
present at the lateral aspect of the right thalamus.

There is no intracranial mass or significant mass effect. There is
no hydrocephalus or extra-axial fluid collection.

Prominence of the ventricles and sulci reflects stable parenchymal
volume loss. Patchy and confluent areas of T2 hyperintensity in the
supratentorial and pontine white matter are nonspecific but probably
reflect moderate to advanced chronic microvascular ischemic changes.
There is a chronic infarct of the left occipital lobe. Punctate
focus of susceptibility hypointensity at the posterior left thalamus
is compatible with chronic microhemorrhage or less likely
mineralization.

Vascular: Major vessel flow voids at the skull base are preserved.

Skull and upper cervical spine: Normal marrow signal is preserved.

Sinuses/Orbits: Minor mucosal thickening. Bilateral lens
replacements.

Other: Sella is unremarkable.  Mastoid air cells are clear.
IMPRESSION: Small acute infarct of the lateral right thalamus.

Moderate to advanced chronic microvascular ischemic changes. Chronic
left occipital infarct.

## 2021-09-24 IMAGING — CT CT HEAD W/O CM
4 series · 16 of 47 positions shown, 18 images · non-contrast
Comparison: MRI brain of 12/03/2018

CLINICAL DATA: Facial trauma, hematoma above the LEFT eye

EXAM:
CT HEAD WITHOUT CONTRAST
CT MAXILLOFACIAL WITHOUT CONTRAST
TECHNIQUE: Multidetector CT imaging of the head and maxillofacial structures
were performed using the standard protocol without intravenous
contrast. Multiplanar CT image reconstructions of the maxillofacial
structures were also generated.

[Series 3: head without · axial · non-contrast · 0.43mm/px · z∈[-57,+68]mm · 7 of 35 slices shown, 9 images]
[im 5/35  brain]
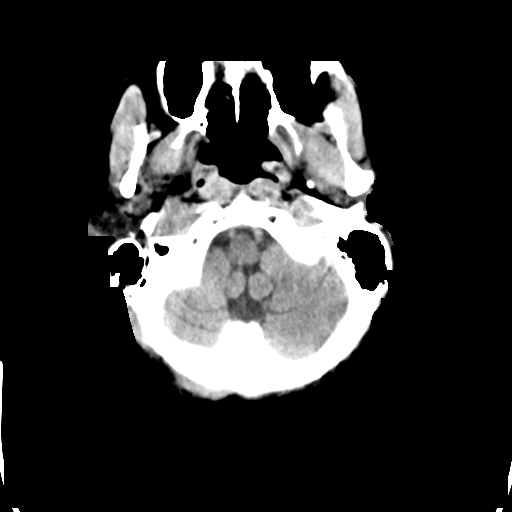
[im 5/35  bone]
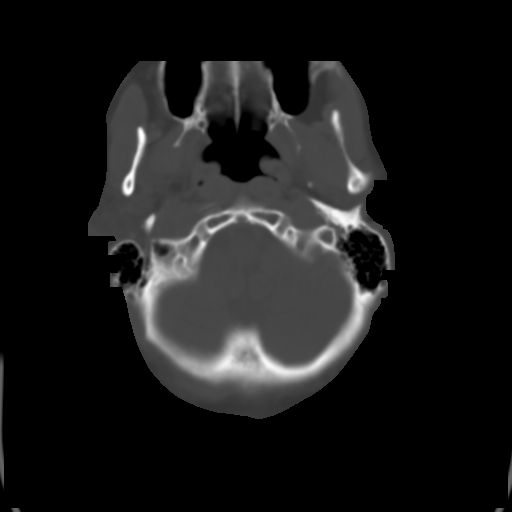
[im 9/35  brain]
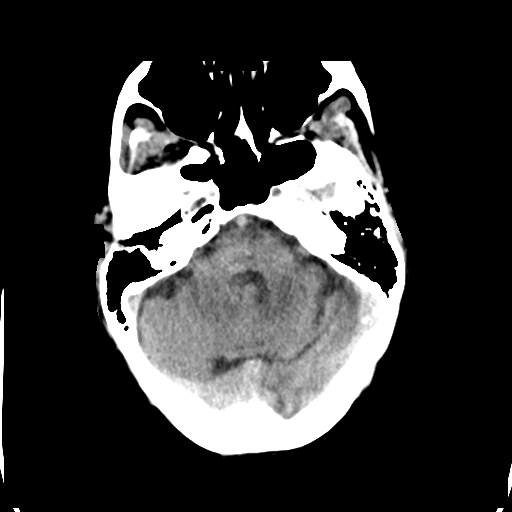
[im 13/35  brain]
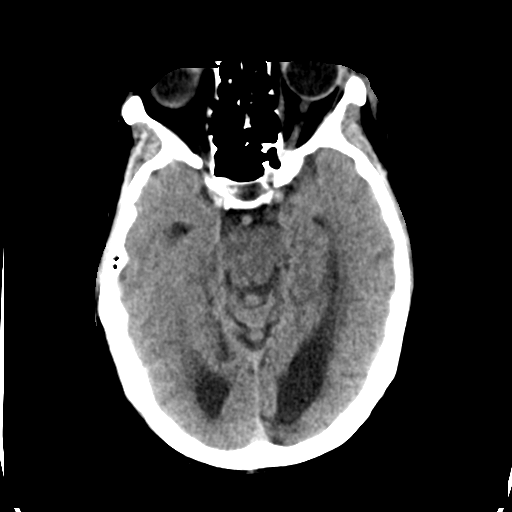
[im 18/35  brain]
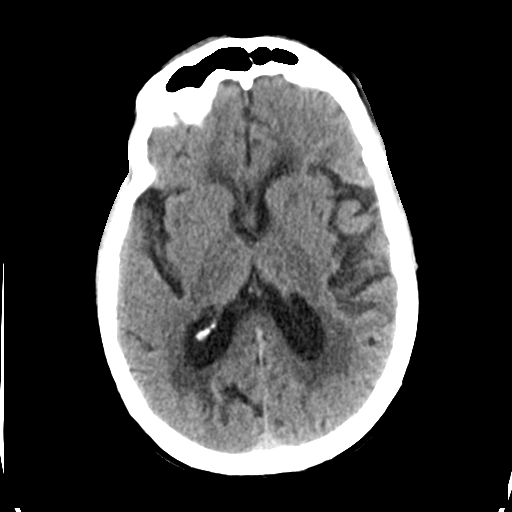
[im 22/35  brain]
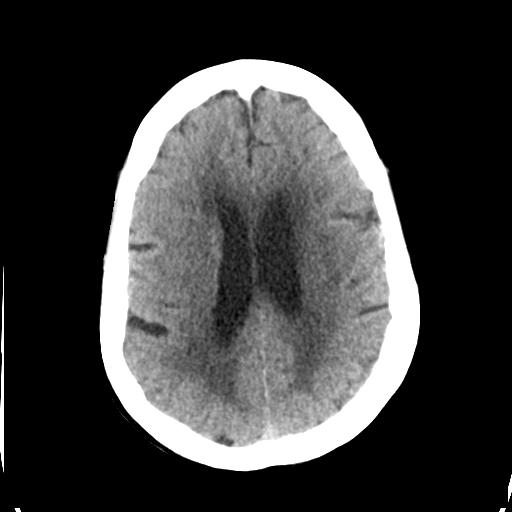
[im 22/35  bone]
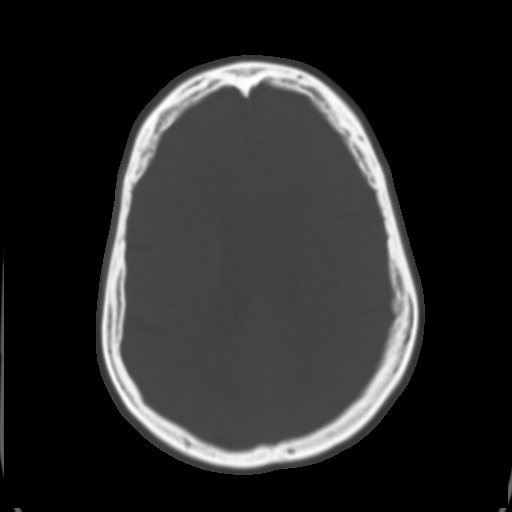
[im 26/35  brain]
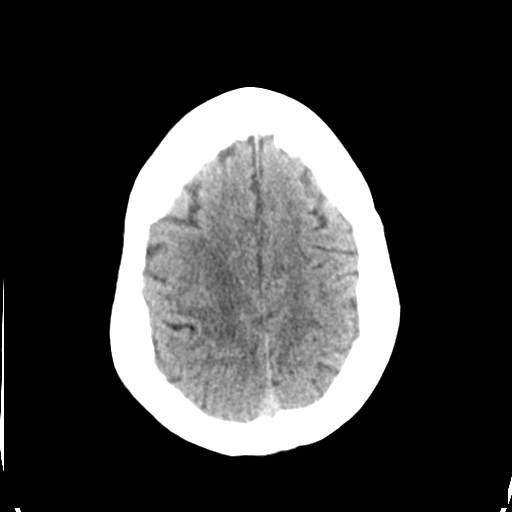
[im 30/35  brain]
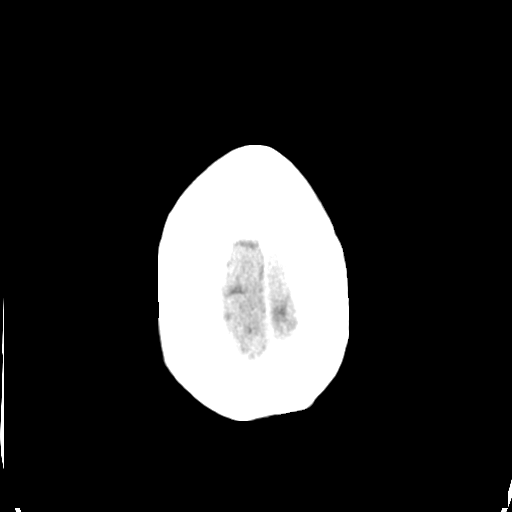

[Series 4: head bone · axial · 0.43mm/px · z∈[-61,-27]mm · 3 of 86 slices shown]
[im 9/86  bone]
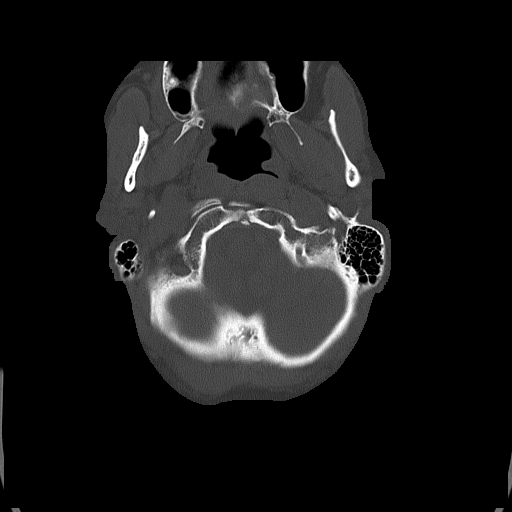
[im 18/86  bone]
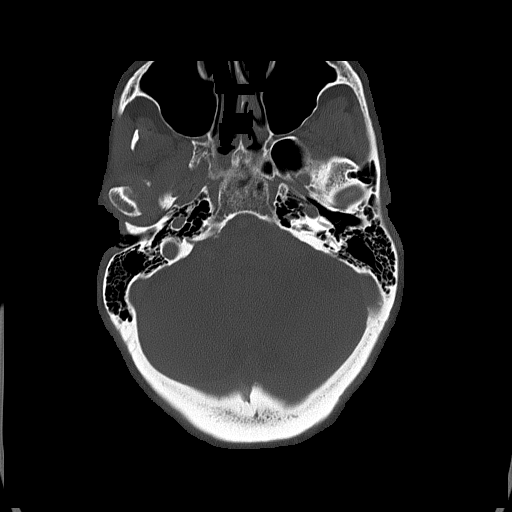
[im 26/86  bone]
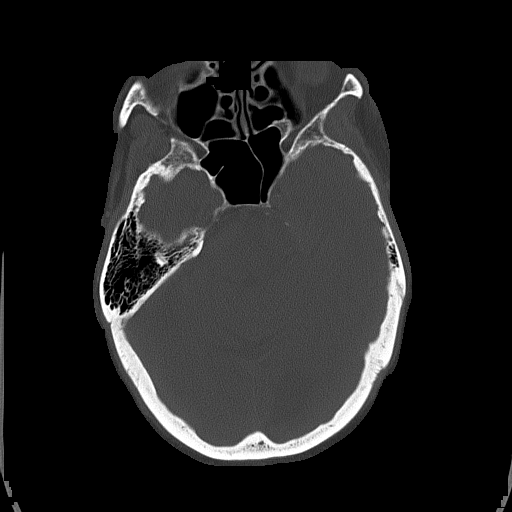

[Series 5: head without cor · coronal · non-contrast · 0.32mm/px · 3 of 72 slices shown]
[im 24/72  brain]
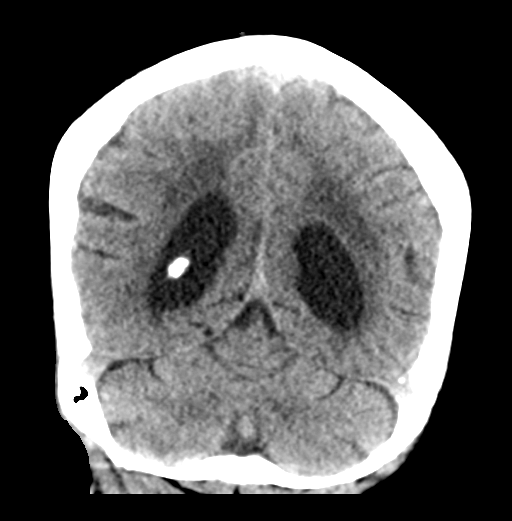
[im 32/72  brain]
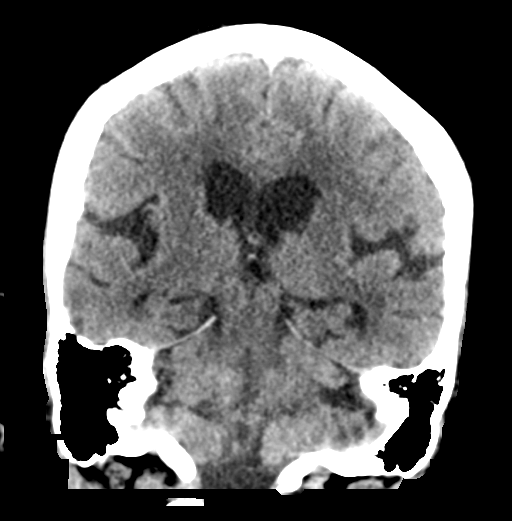
[im 40/72  brain]
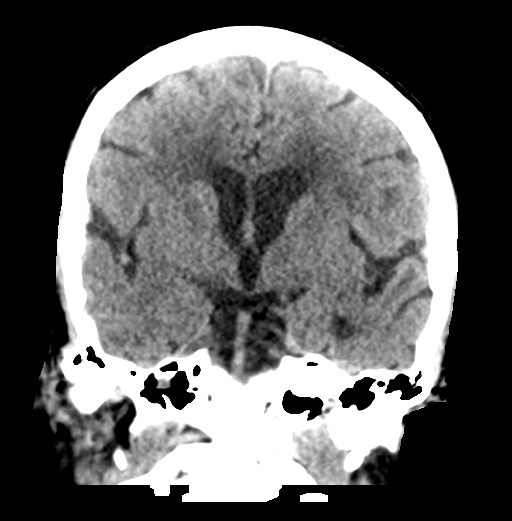

[Series 6: head without sag · sagittal · non-contrast · 0.33mm/px · 3 of 64 slices shown]
[im 22/64  brain]
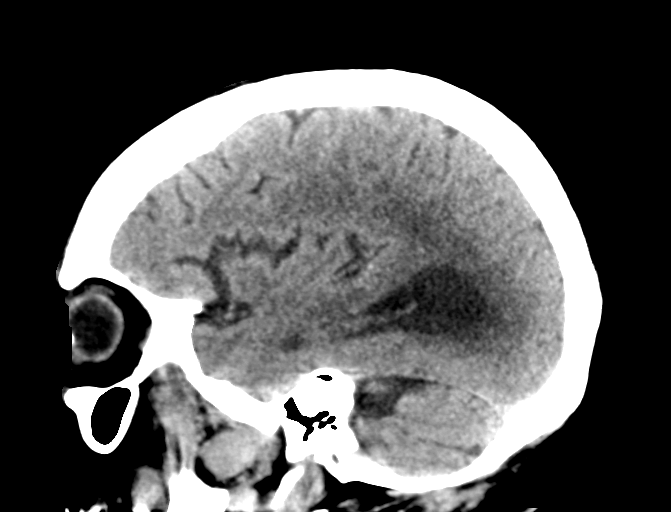
[im 32/64  brain]
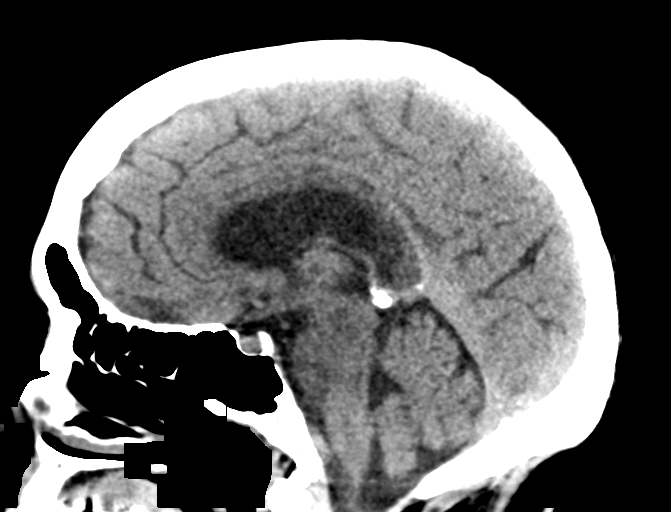
[im 43/64  brain]
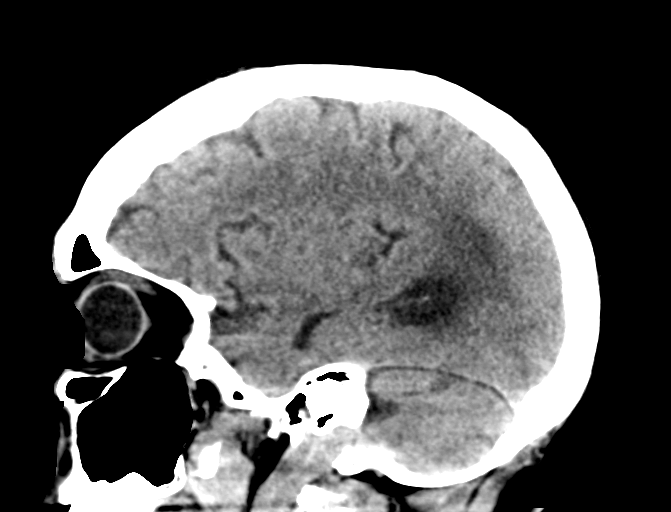

[16 of 47 positions shown; findings below may reference images not displayed]

FINDINGS: CT HEAD FINDINGS

Brain: No evidence of acute infarction, hemorrhage, hydrocephalus,
extra-axial collection or mass lesion/mass effect. Signs of atrophy,
chronic microvascular ischemic change in deep and periventricular
white matter and of prior infarct.

Vascular: No hyperdense vessel or unexpected calcification.

Skull: Normal. Negative for fracture or focal lesion.

Other: LEFT supraorbital/frontal hematoma along the lateral margin
of the LEFT orbit, see below.

CT MAXILLOFACIAL FINDINGS

Osseous: No fracture or mandibular dislocation. No destructive
process.

Orbits: Periorbital stranding on the LEFT mainly along the lateral
and superior margin of the orbit. No retro-orbital fat stranding.

Sinuses: Clear.

Soft tissues: Periorbital hematoma on the LEFT.
IMPRESSION: 1. No acute intracranial pathology. Atrophy, signs of prior infarct
and evidence of chronic microvascular ischemic change similar to the
prior study.
2. No acute facial bone fracture.
3. Periorbital hematoma on the LEFT mainly along the lateral and
superior margin of the orbit. No retro-orbital fat stranding.

## 2021-09-27 ENCOUNTER — Non-Acute Institutional Stay: Payer: Medicare PPO | Admitting: Family Medicine

## 2021-09-27 ENCOUNTER — Encounter: Payer: Self-pay | Admitting: Family Medicine

## 2021-09-27 DIAGNOSIS — H9192 Unspecified hearing loss, left ear: Secondary | ICD-10-CM

## 2021-09-27 NOTE — Progress Notes (Signed)
Provider:  Jacalyn Lefevre, MD  Careteam: Patient Care Team: [name], [name], NP as PCP - General (Internal Medicine) Little Ishikawa, MD as PCP - Cardiology (Cardiology) [name], [name], NP as Nurse Practitioner (Internal Medicine) Marzella Schlein., MD (Ophthalmology) Arminda Resides, MD as Consulting Physician (Dermatology) Arminda Resides, MD as Consulting Physician (Dermatology) Teodora Medici, MD as Consulting Physician (Gynecology)  PLACE OF SERVICE:  [facility]  Advanced Directive information    Allergies  Allergen Reactions   Contrast Media [Iodinated Contrast Media] Hives    Hives during IVP at urology office '02, requires 13 hr prep///a.calhoun  Patient came thru ER and had a 1 hour pre-medication and did fine   Oxycodone Other (See Comments)    Decreased appetite.   Boniva [Ibandronic Acid]     Nausea, vomiting, weakness   Codeine Diarrhea and Nausea And Vomiting   Darvocet [Propoxyphene N-Acetaminophen] Diarrhea and Nausea And Vomiting   Erythromycin Nausea And Vomiting   Other Other (See Comments)    No chief complaint on file.    HPI: Patient is a 87 y.o. female.  Patient has routine follow-up appointment in 3 weeks but presents today because of decreased hearing in her left ear.  She has had a problem before with buildup of cerumen and thinks that may be the issue now. She also mentions increase depression, cough we will plan on dealing with his problems in 3 weeks  Review of Systems:  Review of Systems  Constitutional: Negative.   HENT:  Positive for hearing loss.   All other systems reviewed and are negative.   Past Medical History:  Diagnosis Date   Anginal pain (HCC) 08/24/2010   Echo-EF =>55%,LV normal   Arthritis    Cataract 12/2006   Both eyes  Epes MD   Chest pain, unspecified 09/04/2007   Lexiscan-- EF 72%; LV normal   Constipation    Takes flax seed and honey .  Smooth Move tea.   Depression    Diverticulitis    Head injury, closed     with fall   Hypertension    Mitral valve prolapse    Osteopenia    Stroke (HCC) 09/02/2019   Past Surgical History:  Procedure Laterality Date   ANTERIOR CERVICAL DECOMP/DISCECTOMY FUSION N/A 10/23/2012   Procedure: Cervical Five to Cervical Six, Cervical Six to Cervical Seven anterior cervical decompression with fusion plating and bonegraft;  Surgeon: Hewitt Shorts, MD;  Location: [facility];  Service: Neurosurgery;  Laterality: N/A;  ANTERIOR CERVICAL DECOMPRESSION/DISCECTOMY FUSION 2 LEVELS   BREAST CYST ASPIRATION  1990   benign   CARDIAC CATHETERIZATION  08/01/2010   normal coronaries   COLONOSCOPY  approx 2011   EYE SURGERY Bilateral 2011   intestinal blockage surgery  Feb. 2012   "kink in small intestine" per pt   Social History:   reports that she has never smoked. She has never used smokeless tobacco. She reports that she does not currently use alcohol after a past usage of about 4.0 standard drinks of alcohol per week. She reports that she does not use drugs.  Family History  Problem Relation Age of Onset   Hypertension Mother    Transient ischemic attack Mother    Parkinson's disease Mother    Dementia Mother    CVA Mother    Diabetes Father    Coronary artery disease Father    Heart failure Father    Cancer Father  prostate   Depression Father    Congestive Heart Failure Father    Prostate cancer Brother    Diabetes Brother    Prostate cancer Paternal Grandmother     Medications: Patient's Medications  New Prescriptions   No medications on file  Previous Medications   ALENDRONATE (FOSAMAX) 70 MG TABLET    TAKE 1 TAB ONCE A WEEK, AT LEAST 30 MIN BEFORE 1ST FOOD.DO NOT LIE DOWN FOR 30 MIN AFTER TAKING.   AMLODIPINE (NORVASC) 2.5 MG TABLET    Take 1 tablet (2.5 mg total) by mouth daily.   ATORVASTATIN (LIPITOR) 20 MG TABLET    TAKE ONE TABLET BY MOUTH ONCE DAILY.   CALCIUM CARB-CHOLECALCIFEROL (CALCIUM 600+D3 PO)    Take 600 mg by mouth daily.     CITALOPRAM (CELEXA) 20 MG TABLET    Take 1 tablet (20 mg total) by mouth daily.   CLOPIDOGREL (PLAVIX) 75 MG TABLET    TAKE 1 TABLET ONCE DAILY.   DOCUSATE SODIUM (COLACE) 100 MG CAPSULE    Take 100 mg by mouth at bedtime.   LOSARTAN (COZAAR) 50 MG TABLET    Take 1 tablet (50 mg total) by mouth daily.   METOPROLOL TARTRATE (LOPRESSOR) 25 MG TABLET    TAKE 1 TABLET BY MOUTH TWICE DAILY.   PANTOPRAZOLE (PROTONIX) 40 MG TABLET    TAKE 1 TABLET ONCE DAILY.  Modified Medications   No medications on file  Discontinued Medications   No medications on file    Physical Exam:  There were no vitals filed for this visit. There is no height or weight on file to calculate BMI. Wt Readings from Last 3 Encounters:  05/17/21 152 lb 4.8 oz (69.1 kg)  02/22/21 150 lb 9.6 oz (68.3 kg)  12/07/20 148 lb 9.6 oz (67.4 kg)    Physical Exam Vitals and nursing note reviewed.  Constitutional:      Appearance: Normal appearance.  HENT:     Left Ear: Tympanic membrane normal.     Ears:     Comments: Left EAC occluded by cerumen..  Will irrigate Neurological:     Mental Status: She is alert.     Labs reviewed: Basic Metabolic Panel: Recent Labs    10/04/20 0650  NA 142  K 3.9  CL 105  CO2 29  GLUCOSE 100*  BUN 19  CREATININE 0.87  CALCIUM 9.8   Liver Function Tests: Recent Labs    10/04/20 0650  AST 22  ALT 12  BILITOT 0.4  PROT 7.0   No results for input(s): "LIPASE", "AMYLASE" in the last 8760 hours. No results for input(s): "AMMONIA" in the last 8760 hours. CBC: No results for input(s): "WBC", "NEUTROABS", "HGB", "HCT", "MCV", "PLT" in the last 8760 hours. Lipid Panel: Recent Labs    10/04/20 0650  CHOL 137  HDL 51  LDLCALC 73  TRIG 54  CHOLHDL 2.7   TSH: No results for input(s): "TSH" in the last 8760 hours. A1C: Lab Results  Component Value Date   HGBA1C 5.3 09/03/2019     Assessment/Plan 1. Hearing loss associated with syndrome, left Irrigated cerumen.  TM  visualized.  Eardrum appears dull and does not move to air insufflation or Valsalva maneuver.  I suspect at least part of the problem is related to eustachian tube dysfunction and effusion.  We will try Flonase and ear exercises until she returns at her normal scheduled appointment in 3 weeks.  If problem is not helped doing above will refer  to audiology for evaluation   Jacalyn Lefevre, MD [facility] [phone]

## 2021-09-29 ENCOUNTER — Telehealth: Payer: Self-pay

## 2021-09-29 NOTE — Telephone Encounter (Signed)
Patient states Dr. Hyacinth Meeker was suppose to start her on a new medication but nothing has been called in. She understands it will be Monday before receiving a response.

## 2021-10-03 NOTE — Telephone Encounter (Signed)
Called patient. No answer. Left message to return call to discuss Dr. Rondel Baton response.

## 2021-10-03 NOTE — Telephone Encounter (Signed)
Discussed with the patient.

## 2021-10-18 ENCOUNTER — Non-Acute Institutional Stay: Payer: Medicare PPO | Admitting: Family Medicine

## 2021-10-18 ENCOUNTER — Encounter: Payer: Self-pay | Admitting: Family Medicine

## 2021-10-18 VITALS — BP 130/82 | HR 86 | Temp 96.5°F | Ht 66.0 in | Wt 150.8 lb

## 2021-10-18 DIAGNOSIS — I10 Essential (primary) hypertension: Secondary | ICD-10-CM

## 2021-10-18 DIAGNOSIS — E785 Hyperlipidemia, unspecified: Secondary | ICD-10-CM | POA: Diagnosis not present

## 2021-10-18 DIAGNOSIS — R2689 Other abnormalities of gait and mobility: Secondary | ICD-10-CM

## 2021-10-18 DIAGNOSIS — R269 Unspecified abnormalities of gait and mobility: Secondary | ICD-10-CM

## 2021-10-18 DIAGNOSIS — H9192 Unspecified hearing loss, left ear: Secondary | ICD-10-CM

## 2021-10-18 DIAGNOSIS — M25569 Pain in unspecified knee: Secondary | ICD-10-CM

## 2021-10-18 DIAGNOSIS — F418 Other specified anxiety disorders: Secondary | ICD-10-CM

## 2021-10-18 DIAGNOSIS — I5032 Chronic diastolic (congestive) heart failure: Secondary | ICD-10-CM

## 2021-10-18 NOTE — Progress Notes (Signed)
Provider:  Jacalyn Lefevre, MD  Careteam: Patient Care Team: [name], [name], NP as PCP - General (Internal Medicine) Little Ishikawa, MD as PCP - Cardiology (Cardiology) [name], [name], NP as Nurse Practitioner (Internal Medicine) Marzella Schlein., MD (Ophthalmology) Arminda Resides, MD as Consulting Physician (Dermatology) Arminda Resides, MD as Consulting Physician (Dermatology) Teodora Medici, MD as Consulting Physician (Gynecology)  PLACE OF SERVICE:  [facility]  Advanced Directive information    Allergies  Allergen Reactions   Contrast Media [Iodinated Contrast Media] Hives    Hives during IVP at urology office '02, requires 13 hr prep///a.calhoun  Patient came thru ER and had a 1 hour pre-medication and did fine   Oxycodone Other (See Comments)    Decreased appetite.   Boniva [Ibandronic Acid]     Nausea, vomiting, weakness   Codeine Diarrhea and Nausea And Vomiting   Darvocet [Propoxyphene N-Acetaminophen] Diarrhea and Nausea And Vomiting   Erythromycin Nausea And Vomiting   Other Other (See Comments)    No chief complaint on file.    HPI: Patient is a 87 y.o. female this is a routine follow-up visit for maintenance of chronic problems which include depression status post 2 CVAs in GERD, anxiety. In general I think Adriana [name] is doing well.  She questions need for different antidepressant but then she decided that her depression is really the result of several health issues with her adult children and does not really need to change her medicine.  She continues to have mobility issues and gets around more and more in her motorized scooter rather than walking. She is requesting a physical therapy consult to help with her mobility.  I am glad to make this referral but I think chances of improvement are not great. She describes some incontinence.  She does wear depends.  I believe at her last visit we tried some samples of medication for overactive bladder.  Review of Systems:   Review of Systems  Constitutional: Negative.   HENT:  Positive for hearing loss.   Respiratory: Negative.    Cardiovascular: Negative.   Genitourinary:  Positive for frequency.  Musculoskeletal:  Positive for joint pain.  Psychiatric/Behavioral:  Positive for depression. The patient has insomnia.   All other systems reviewed and are negative.   Past Medical History:  Diagnosis Date   Anginal pain (HCC) 08/24/2010   Echo-EF =>55%,LV normal   Arthritis    Cataract 12/2006   Both eyes  Epes MD   Chest pain, unspecified 09/04/2007   Lexiscan-- EF 72%; LV normal   Constipation    Takes flax seed and honey .  Smooth Move tea.   Depression    Diverticulitis    Head injury, closed    with fall   Hypertension    Mitral valve prolapse    Osteopenia    Stroke (HCC) 09/02/2019   Past Surgical History:  Procedure Laterality Date   ANTERIOR CERVICAL DECOMP/DISCECTOMY FUSION N/A 10/23/2012   Procedure: Cervical Five to Cervical Six, Cervical Six to Cervical Seven anterior cervical decompression with fusion plating and bonegraft;  Surgeon: Hewitt Shorts, MD;  Location: [facility];  Service: Neurosurgery;  Laterality: N/A;  ANTERIOR CERVICAL DECOMPRESSION/DISCECTOMY FUSION 2 LEVELS   BREAST CYST ASPIRATION  1990   benign   CARDIAC CATHETERIZATION  08/01/2010   normal coronaries   COLONOSCOPY  approx 2011   EYE SURGERY Bilateral 2011   intestinal blockage surgery  Feb. 2012   "kink in small intestine" per  pt   Social History:   reports that she has never smoked. She has never used smokeless tobacco. She reports that she does not currently use alcohol after a past usage of about 4.0 standard drinks of alcohol per week. She reports that she does not use drugs.  Family History  Problem Relation Age of Onset   Hypertension Mother    Transient ischemic attack Mother    Parkinson's disease Mother    Dementia Mother    CVA Mother    Diabetes Father    Coronary artery disease  Father    Heart failure Father    Cancer Father        prostate   Depression Father    Congestive Heart Failure Father    Prostate cancer Brother    Diabetes Brother    Prostate cancer Paternal Grandmother     Medications: Patient's Medications  New Prescriptions   No medications on file  Previous Medications   ALENDRONATE (FOSAMAX) 70 MG TABLET    TAKE 1 TAB ONCE A WEEK, AT LEAST 30 MIN BEFORE 1ST FOOD.DO NOT LIE DOWN FOR 30 MIN AFTER TAKING.   AMLODIPINE (NORVASC) 2.5 MG TABLET    Take 1 tablet (2.5 mg total) by mouth daily.   ATORVASTATIN (LIPITOR) 20 MG TABLET    TAKE ONE TABLET BY MOUTH ONCE DAILY.   CALCIUM CARB-CHOLECALCIFEROL (CALCIUM 600+D3 PO)    Take 600 mg by mouth daily.    CITALOPRAM (CELEXA) 20 MG TABLET    Take 1 tablet (20 mg total) by mouth daily.   CLOPIDOGREL (PLAVIX) 75 MG TABLET    TAKE 1 TABLET ONCE DAILY.   DOCUSATE SODIUM (COLACE) 100 MG CAPSULE    Take 100 mg by mouth at bedtime.   LOSARTAN (COZAAR) 50 MG TABLET    Take 1 tablet (50 mg total) by mouth daily.   METOPROLOL TARTRATE (LOPRESSOR) 25 MG TABLET    TAKE 1 TABLET BY MOUTH TWICE DAILY.   PANTOPRAZOLE (PROTONIX) 40 MG TABLET    TAKE 1 TABLET ONCE DAILY.  Modified Medications   No medications on file  Discontinued Medications   No medications on file    Physical Exam:  There were no vitals filed for this visit. There is no height or weight on file to calculate BMI. Wt Readings from Last 3 Encounters:  05/17/21 152 lb 4.8 oz (69.1 kg)  02/22/21 150 lb 9.6 oz (68.3 kg)  12/07/20 148 lb 9.6 oz (67.4 kg)    Physical Exam Vitals and nursing note reviewed.  Constitutional:      Appearance: Normal appearance.  HENT:     Right Ear: Tympanic membrane normal.     Left Ear: Tympanic membrane normal.  Cardiovascular:     Rate and Rhythm: Normal rate and regular rhythm.  Pulmonary:     Effort: Pulmonary effort is normal.     Breath sounds: Normal breath sounds.  Musculoskeletal:        General:  Normal range of motion.  Neurological:     General: No focal deficit present.     Mental Status: She is alert and oriented to person, place, and time.     Labs reviewed: Basic Metabolic Panel: No results for input(s): "NA", "K", "CL", "CO2", "GLUCOSE", "BUN", "CREATININE", "CALCIUM", "MG", "PHOS", "TSH" in the last 8760 hours. Liver Function Tests: No results for input(s): "AST", "ALT", "ALKPHOS", "BILITOT", "PROT", "ALBUMIN" in the last 8760 hours. No results for input(s): "LIPASE", "AMYLASE" in the last 8760 hours.  No results for input(s): "AMMONIA" in the last 8760 hours. CBC: No results for input(s): "WBC", "NEUTROABS", "HGB", "HCT", "MCV", "PLT" in the last 8760 hours. Lipid Panel: No results for input(s): "CHOL", "HDL", "LDLCALC", "TRIG", "CHOLHDL", "LDLDIRECT" in the last 8760 hours. TSH: No results for input(s): "TSH" in the last 8760 hours. A1C: Lab Results  Component Value Date   HGBA1C 5.3 09/03/2019     Assessment/Plan  1. Primary hypertension Blood pressures have been a little bit low when checked.  I have asked her to monitor blood pressure but cut metoprolol in half and take 12.5 mg twice daily along with 2.5 amlodipine  2. Hyperlipidemia, unspecified hyperlipidemia type It has been a year since we last assessed her lipids she does continue to take atorvastatin.  Last LDL was at goal at 73  3. Knee pain, unspecified chronicity, unspecified laterality Likely degenerative arthritis.  4. Balance problem Likely the result of previous CVAs.  PT will also work with her on balance  5. Depression with anxiety Continue citalopram at 20 mg.  Do not want to increase dose or add medications that she really is doing pretty well    7. Chronic diastolic congestive heart failure (HCC) Well compensated and asymptomatic  8. Hearing loss associated wth syndrome, left The result of cerumen.  TMs are visible today   Jacalyn Lefevre, MD [facility] [phone]

## 2021-10-19 ENCOUNTER — Other Ambulatory Visit: Payer: Medicare PPO

## 2021-10-19 DIAGNOSIS — I10 Essential (primary) hypertension: Secondary | ICD-10-CM

## 2021-10-19 DIAGNOSIS — E785 Hyperlipidemia, unspecified: Secondary | ICD-10-CM

## 2021-10-19 LAB — CBC WITH DIFFERENTIAL/PLATELET
Absolute Monocytes: 551 cells/uL (ref 200–950)
Basophils Absolute: 49 cells/uL (ref 0–200)
Basophils Relative: 0.9 %
Eosinophils Absolute: 362 cells/uL (ref 15–500)
Eosinophils Relative: 6.7 %
HCT: 38 % (ref 35.0–45.0)
Hemoglobin: 13 g/dL (ref 11.7–15.5)
Lymphs Abs: 1042 cells/uL (ref 850–3900)
MCH: 31.5 pg (ref 27.0–33.0)
MCHC: 34.2 g/dL (ref 32.0–36.0)
MCV: 92 fL (ref 80.0–100.0)
MPV: 10.9 fL (ref 7.5–12.5)
Monocytes Relative: 10.2 %
Neutro Abs: 3397 cells/uL (ref 1500–7800)
Neutrophils Relative %: 62.9 %
Platelets: 239 10*3/uL (ref 140–400)
RBC: 4.13 10*6/uL (ref 3.80–5.10)
RDW: 11.6 % (ref 11.0–15.0)
Total Lymphocyte: 19.3 %
WBC: 5.4 10*3/uL (ref 3.8–10.8)

## 2021-10-19 LAB — LIPID PANEL
Cholesterol: 115 mg/dL (ref <200)
HDL: 43 mg/dL — ABNORMAL LOW (ref >=50)
LDL Cholesterol (Calc): 58 mg/dL (calc)
Non-HDL Cholesterol (Calc): 72 mg/dL (calc) (ref <130)
Total CHOL/HDL Ratio: 2.7 (calc) (ref <5.0)
Triglycerides: 67 mg/dL (ref <150)

## 2021-10-19 LAB — SEDIMENTATION RATE: Sed Rate: 11 mm/h (ref 0–30)

## 2021-10-20 ENCOUNTER — Other Ambulatory Visit: Payer: Self-pay | Admitting: Family Medicine

## 2021-11-01 ENCOUNTER — Encounter: Payer: Self-pay | Admitting: Family Medicine

## 2021-11-01 ENCOUNTER — Non-Acute Institutional Stay: Payer: Medicare PPO | Admitting: Family Medicine

## 2021-11-01 VITALS — BP 126/84 | HR 82 | Temp 97.2°F

## 2021-11-01 DIAGNOSIS — R053 Chronic cough: Secondary | ICD-10-CM | POA: Diagnosis not present

## 2021-11-01 DIAGNOSIS — J0100 Acute maxillary sinusitis, unspecified: Secondary | ICD-10-CM

## 2021-11-01 DIAGNOSIS — R269 Unspecified abnormalities of gait and mobility: Secondary | ICD-10-CM

## 2021-11-01 MED ORDER — AMOXICILLIN 500 MG PO CAPS: 500.0000 mg | ORAL_CAPSULE | Freq: Three times a day (TID) | ORAL | 0 refills | Status: AC

## 2021-11-01 NOTE — Progress Notes (Signed)
Provider:  Jacalyn Lefevre, MD  Careteam: Patient Care Team: [name], [name], NP as PCP - General (Internal Medicine) Little Ishikawa, MD as PCP - Cardiology (Cardiology) [name], [name], NP as Nurse Practitioner (Internal Medicine) Marzella Schlein., MD (Ophthalmology) Arminda Resides, MD as Consulting Physician (Dermatology) Arminda Resides, MD as Consulting Physician (Dermatology) Teodora Medici, MD as Consulting Physician (Gynecology)  PLACE OF SERVICE:  [facility]  Advanced Directive information    Allergies  Allergen Reactions   Contrast Media [Iodinated Contrast Media] Hives    Hives during IVP at urology office '02, requires 13 hr prep///a.calhoun  Patient came thru ER and had a 1 hour pre-medication and did fine   Oxycodone Other (See Comments)    Decreased appetite.   Boniva [Ibandronic Acid]     Nausea, vomiting, weakness   Codeine Diarrhea and Nausea And Vomiting   Darvocet [Propoxyphene N-Acetaminophen] Diarrhea and Nausea And Vomiting   Erythromycin Nausea And Vomiting   Other Other (See Comments)    No chief complaint on file.    HPI: Patient is a 87 y.o. female.  Adriana [name] is concerned about a recent fall yesterday but increasing weakness in her left leg after her second stroke.  She currently lives in independent living but is thinking she needs to perhaps transfer to assisted living realizing that she will likely require more care with age.  We spent a good deal of time talking about the pros and cons of this movement.  I think she is leaning toward moving.  She also complains today of some pain around her sinuses pain is worse with bending over.  She denies congestion. In addition she has some urinary incontinence this sounds like it is probably stress or overflow rather than urge incontinence.  She does wear depends and visits bathroom frequently.  She is not interested in medication that might help. Review of Systems:  Review of Systems  Constitutional:  Negative.   HENT: Negative.    Eyes:  Positive for blurred vision.  Respiratory:  Positive for cough.   Cardiovascular: Negative.   Gastrointestinal: Negative.   Genitourinary:  Positive for frequency.  Neurological:  Positive for focal weakness.  Psychiatric/Behavioral:  Positive for depression.   All other systems reviewed and are negative.   Past Medical History:  Diagnosis Date   Anginal pain (HCC) 08/24/2010   Echo-EF =>55%,LV normal   Arthritis    Cataract 12/2006   Both eyes  Epes MD   Chest pain, unspecified 09/04/2007   Lexiscan-- EF 72%; LV normal   Constipation    Takes flax seed and honey .  Smooth Move tea.   Depression    Diverticulitis    Head injury, closed    with fall   Hypertension    Mitral valve prolapse    Osteopenia    Stroke (HCC) 09/02/2019   Past Surgical History:  Procedure Laterality Date   ANTERIOR CERVICAL DECOMP/DISCECTOMY FUSION N/A 10/23/2012   Procedure: Cervical Five to Cervical Six, Cervical Six to Cervical Seven anterior cervical decompression with fusion plating and bonegraft;  Surgeon: Hewitt Shorts, MD;  Location: [facility];  Service: Neurosurgery;  Laterality: N/A;  ANTERIOR CERVICAL DECOMPRESSION/DISCECTOMY FUSION 2 LEVELS   BREAST CYST ASPIRATION  1990   benign   CARDIAC CATHETERIZATION  08/01/2010   normal coronaries   COLONOSCOPY  approx 2011   EYE SURGERY Bilateral 2011   intestinal blockage surgery  Feb. 2012   "kink in small intestine" per  pt   Social History:   reports that she has never smoked. She has never used smokeless tobacco. She reports that she does not currently use alcohol after a past usage of about 4.0 standard drinks of alcohol per week. She reports that she does not use drugs.  Family History  Problem Relation Age of Onset   Hypertension Mother    Transient ischemic attack Mother    Parkinson's disease Mother    Dementia Mother    CVA Mother    Diabetes Father    Coronary artery disease  Father    Heart failure Father    Cancer Father        prostate   Depression Father    Congestive Heart Failure Father    Prostate cancer Brother    Diabetes Brother    Prostate cancer Paternal Grandmother     Medications: Patient's Medications  New Prescriptions   No medications on file  Previous Medications   ALENDRONATE (FOSAMAX) 70 MG TABLET    TAKE 1 TAB ONCE A WEEK, AT LEAST 30 MIN BEFORE 1ST FOOD.DO NOT LIE DOWN FOR 30 MIN AFTER TAKING.   AMLODIPINE (NORVASC) 2.5 MG TABLET    Take 1 tablet (2.5 mg total) by mouth daily.   ATORVASTATIN (LIPITOR) 20 MG TABLET    TAKE ONE TABLET BY MOUTH ONCE DAILY.   CALCIUM CARB-CHOLECALCIFEROL (CALCIUM 600+D3 PO)    Take 600 mg by mouth daily.    CITALOPRAM (CELEXA) 20 MG TABLET    Take 1 tablet (20 mg total) by mouth daily.   CLOPIDOGREL (PLAVIX) 75 MG TABLET    TAKE 1 TABLET ONCE DAILY.   DOCUSATE SODIUM (COLACE) 100 MG CAPSULE    Take 100 mg by mouth at bedtime.   LOSARTAN (COZAAR) 50 MG TABLET    Take 1 tablet (50 mg total) by mouth daily.   METOPROLOL TARTRATE (LOPRESSOR) 25 MG TABLET    TAKE 1 TABLET BY MOUTH TWICE DAILY.   PANTOPRAZOLE (PROTONIX) 40 MG TABLET    TAKE 1 TABLET ONCE DAILY.  Modified Medications   No medications on file  Discontinued Medications   No medications on file    Physical Exam:  There were no vitals filed for this visit. There is no height or weight on file to calculate BMI. Wt Readings from Last 3 Encounters:  10/18/21 150 lb 12.8 oz (68.4 kg)  05/17/21 152 lb 4.8 oz (69.1 kg)  02/22/21 150 lb 9.6 oz (68.3 kg)    Physical Exam Vitals and nursing note reviewed.  Constitutional:      Appearance: Normal appearance.  Cardiovascular:     Rate and Rhythm: Normal rate.  Pulmonary:     Effort: Pulmonary effort is normal.  Musculoskeletal:     Comments: Patient uses a motorized scooter for [place] distances.  Still able to walk some with her walker but increasingly having difficulty  Neurological:      General: No focal deficit present.     Mental Status: She is alert and oriented to person, place, and time.     Labs reviewed: Basic Metabolic Panel: No results for input(s): "NA", "K", "CL", "CO2", "GLUCOSE", "BUN", "CREATININE", "CALCIUM", "MG", "PHOS", "TSH" in the last 8760 hours. Liver Function Tests: No results for input(s): "AST", "ALT", "ALKPHOS", "BILITOT", "PROT", "ALBUMIN" in the last 8760 hours. No results for input(s): "LIPASE", "AMYLASE" in the last 8760 hours. No results for input(s): "AMMONIA" in the last 8760 hours. CBC: Recent Labs    10/19/21 0729  WBC 5.4  NEUTROABS 3,397  HGB 13.0  HCT 38.0  MCV 92.0  PLT 239   Lipid Panel: Recent Labs    10/19/21 0729  CHOL 115  HDL 43*  LDLCALC 58  TRIG 67  CHOLHDL 2.7   TSH: No results for input(s): "TSH" in the last 8760 hours. A1C: Lab Results  Component Value Date   HGBA1C 5.3 09/03/2019     Assessment/Plan  1. Chronic cough Cough is intermittent.  Doubt that it is related to ARB but that has to be considered a possibility.  We will discontinue losartan and increase amlodipine from 2.5 mg to 5 mg and follow  2. Gait abnormality Continue with walker as much as possible.  Also having some neck pain we will get physical therapy to evaluate and treat   Jacalyn Lefevre, MD [facility] [phone]

## 2021-11-01 NOTE — Addendum Note (Signed)
Addended by: Cheron Every C on: 11/01/2021 03:07 PM   Modules accepted: Orders

## 2021-11-01 NOTE — Patient Instructions (Signed)
Discontinue losartan and increase amlodipine from 2.5 mg to 5 mg

## 2021-11-21 ENCOUNTER — Encounter: Payer: Self-pay | Admitting: Nurse Practitioner

## 2021-11-21 NOTE — Progress Notes (Unsigned)
Location:  [facility] Room Number: AL/904/A Place of Service:  ALF (13) Provider:  [name], [name], NP  Patient Care Team: [name], [name], NP as PCP - General (Internal Medicine) Little Ishikawa, MD as PCP - Cardiology (Cardiology) [name], [name], NP as Nurse Practitioner (Internal Medicine) Marzella Schlein., MD (Ophthalmology) Arminda Resides, MD as Consulting Physician (Dermatology) Arminda Resides, MD as Consulting Physician (Dermatology) Teodora Medici, MD as Consulting Physician (Gynecology)  Extended Emergency Contact Information Primary Emergency Contact: Aline Brochure [place] Phone: [phone] Relation: Daughter Secondary Emergency Contact: Black,Joel Mobile Phone: [phone] Relation: Son  Code Status:  FULL Goals of care: Advanced Directive information    07/21/2020    3:28 PM  Advanced Directives  Does Patient Have a Medical Advance Directive? Yes  Type of Advance Directive Living will;Healthcare Power of Attorney  Does patient want to make changes to medical advance directive? No - Patient declined  Copy of Healthcare Power of Attorney in Chart? Yes - validated most recent copy scanned in chart (See row information)     Chief Complaint  Patient presents with   Acute Visit    Patient is being seen for medication review after hospital stay    HPI:  Pt is a 87 y.o. female seen today for an acute visit for    Past Medical History:  Diagnosis Date   Anginal pain (HCC) 08/24/2010   Echo-EF =>55%,LV normal   Arthritis    Cataract 12/2006   Both eyes  Epes MD   Chest pain, unspecified 09/04/2007   Lexiscan-- EF 72%; LV normal   Constipation    Takes flax seed and honey .  Smooth Move tea.   Depression    Diverticulitis    Head injury, closed    with fall   Hypertension    Mitral valve prolapse    Osteopenia    Stroke (HCC) 09/02/2019   Past Surgical History:  Procedure Laterality Date   ANTERIOR  CERVICAL DECOMP/DISCECTOMY FUSION N/A 10/23/2012   Procedure: Cervical Five to Cervical Six, Cervical Six to Cervical Seven anterior cervical decompression with fusion plating and bonegraft;  Surgeon: Hewitt Shorts, MD;  Location: [facility];  Service: Neurosurgery;  Laterality: N/A;  ANTERIOR CERVICAL DECOMPRESSION/DISCECTOMY FUSION 2 LEVELS   BREAST CYST ASPIRATION  1990   benign   CARDIAC CATHETERIZATION  08/01/2010   normal coronaries   COLONOSCOPY  approx 2011   EYE SURGERY Bilateral 2011   intestinal blockage surgery  Feb. 2012   "kink in small intestine" per pt    Allergies  Allergen Reactions   Contrast Media [Iodinated Contrast Media] Hives    Hives during IVP at urology office '02, requires 13 hr prep///a.calhoun  Patient came thru ER and had a 1 hour pre-medication and did fine   Oxycodone Other (See Comments)    Decreased appetite.   Boniva [Ibandronic Acid]     Nausea, vomiting, weakness   Codeine Diarrhea and Nausea And Vomiting   Darvocet [Propoxyphene N-Acetaminophen] Diarrhea and Nausea And Vomiting   Erythromycin Nausea And Vomiting   Other Other (See Comments)    Outpatient Encounter Medications as of 11/21/2021  Medication Sig   alendronate (FOSAMAX) 70 MG tablet TAKE 1 TAB ONCE A WEEK, AT LEAST 30 MIN BEFORE 1ST FOOD.DO NOT LIE DOWN FOR 30 MIN AFTER TAKING.   amLODipine (NORVASC) 5 MG tablet Take 5 mg by mouth daily.   atorvastatin (LIPITOR) 20 MG tablet TAKE  ONE TABLET BY MOUTH ONCE DAILY.   Calcium Carb-Cholecalciferol (CALCIUM 600+D3 PO) Take 600 mg by mouth daily.    citalopram (CELEXA) 20 MG tablet Take 1 tablet (20 mg total) by mouth daily.   clopidogrel (PLAVIX) 75 MG tablet TAKE 1 TABLET ONCE DAILY.   docusate sodium (COLACE) 100 MG capsule Take 100 mg by mouth at bedtime.   metoprolol tartrate (LOPRESSOR) 25 MG tablet Take 12.5 mg by mouth 2 (two) times daily.   pantoprazole (PROTONIX) 40 MG tablet TAKE 1 TABLET ONCE DAILY.   No  facility-administered encounter medications on file as of 11/21/2021.    Review of Systems  Immunization History  Administered Date(s) Administered   Fluad Quad(high Dose 65+) 12/17/2018   Influenza, High Dose Seasonal PF 12/08/2016, 12/19/2020   Influenza-Unspecified 12/16/2019   Moderna Sars-Covid-2 Vaccination 03/09/2019, 04/06/2019, 01/12/2020   Pneumococcal Conjugate-13 06/12/2013   Pneumococcal Polysaccharide-23 07/28/2002   Tdap 07/27/2009   Zoster Recombinat (Shingrix) 01/08/2017, 06/03/2017   Pertinent  Health Maintenance Due  Topic Date Due   INFLUENZA VACCINE  10/03/2021   DEXA SCAN  Completed      10/26/2020    3:16 PM 02/22/2021    2:02 PM 05/17/2021    2:10 PM 09/27/2021    2:10 PM 11/01/2021    1:38 PM  Fall Risk  Falls in the past year? 0 0 0 0 1  Was there an injury with Fall? 0 0 0 0 0  Fall Risk Category Calculator 0 0 0 0 1  Fall Risk Category Low Low Low Low Low  Patient Fall Risk Level Low fall risk Low fall risk Low fall risk Low fall risk Low fall risk  Patient at Risk for Falls Due to History of fall(s) History of fall(s) History of fall(s) History of fall(s) History of fall(s)  Fall risk Follow up Falls evaluation completed;Education provided;Falls prevention discussed Falls evaluation completed;Education provided;Falls prevention discussed Falls evaluation completed     Functional Status Survey:    Vitals:   11/21/21 1354  BP: (!) 160/70  Pulse: (!) 52  Resp: 20  Temp: 97.9 F (36.6 C)  SpO2: 94%  Weight: 133 lb (60.3 kg)  Height: 5\' 6"  (1.676 m)   Body mass index is 21.47 kg/m. Physical Exam  Labs reviewed: No results for input(s): "NA", "K", "CL", "CO2", "GLUCOSE", "BUN", "CREATININE", "CALCIUM", "MG", "PHOS" in the last 8760 hours. No results for input(s): "AST", "ALT", "ALKPHOS", "BILITOT", "PROT", "ALBUMIN" in the last 8760 hours. Recent Labs    10/19/21 0729  WBC 5.4  NEUTROABS 3,397  HGB 13.0  HCT 38.0  MCV 92.0  PLT 239    Lab Results  Component Value Date   TSH 4.05 06/30/2019   Lab Results  Component Value Date   HGBA1C 5.3 09/03/2019   Lab Results  Component Value Date   CHOL 115 10/19/2021   HDL 43 (L) 10/19/2021   LDLCALC 58 10/19/2021   TRIG 67 10/19/2021   CHOLHDL 2.7 10/19/2021    Significant Diagnostic Results in last 30 days:  No results found.  Assessment/Plan There are no diagnoses linked to this encounter.   Family/ staff Communication: ***  Labs/tests ordered:  ***

## 2021-11-22 DIAGNOSIS — R29898 Other symptoms and signs involving the musculoskeletal system: Secondary | ICD-10-CM | POA: Diagnosis not present

## 2021-11-22 DIAGNOSIS — M6281 Muscle weakness (generalized): Secondary | ICD-10-CM | POA: Diagnosis not present

## 2021-11-23 ENCOUNTER — Encounter: Payer: Self-pay | Admitting: Nurse Practitioner

## 2021-11-23 ENCOUNTER — Non-Acute Institutional Stay: Payer: Medicare PPO | Admitting: Nurse Practitioner

## 2021-11-23 DIAGNOSIS — K5901 Slow transit constipation: Secondary | ICD-10-CM | POA: Diagnosis not present

## 2021-11-23 DIAGNOSIS — Z8673 Personal history of transient ischemic attack (TIA), and cerebral infarction without residual deficits: Secondary | ICD-10-CM | POA: Diagnosis not present

## 2021-11-23 DIAGNOSIS — M6281 Muscle weakness (generalized): Secondary | ICD-10-CM | POA: Diagnosis not present

## 2021-11-23 DIAGNOSIS — R269 Unspecified abnormalities of gait and mobility: Secondary | ICD-10-CM

## 2021-11-23 DIAGNOSIS — M159 Polyosteoarthritis, unspecified: Secondary | ICD-10-CM

## 2021-11-23 DIAGNOSIS — E785 Hyperlipidemia, unspecified: Secondary | ICD-10-CM

## 2021-11-23 DIAGNOSIS — N3942 Incontinence without sensory awareness: Secondary | ICD-10-CM

## 2021-11-23 DIAGNOSIS — I10 Essential (primary) hypertension: Secondary | ICD-10-CM

## 2021-11-23 DIAGNOSIS — K219 Gastro-esophageal reflux disease without esophagitis: Secondary | ICD-10-CM

## 2021-11-23 DIAGNOSIS — M810 Age-related osteoporosis without current pathological fracture: Secondary | ICD-10-CM

## 2021-11-23 DIAGNOSIS — F418 Other specified anxiety disorders: Secondary | ICD-10-CM

## 2021-11-23 DIAGNOSIS — R29898 Other symptoms and signs involving the musculoskeletal system: Secondary | ICD-10-CM | POA: Diagnosis not present

## 2021-11-23 DIAGNOSIS — R053 Chronic cough: Secondary | ICD-10-CM

## 2021-11-23 NOTE — Assessment & Plan Note (Signed)
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 Takes Plavix. Atorvastatin. affected gait and vision issues, feels weak left leg.  

## 2021-11-23 NOTE — Assessment & Plan Note (Signed)
Chronic occasional dry cough for about a year, left side throat tickles sometimes during eating, then cough, ? Resultant of CVA. . ST: no apparent swallowing problem.  

## 2021-11-23 NOTE — Assessment & Plan Note (Signed)
Blood pressure is controlled, takes Amlodipine, Metoprolol.

## 2021-11-23 NOTE — Progress Notes (Signed)
This encounter was created in error - please disregard.

## 2021-11-23 NOTE — Assessment & Plan Note (Addendum)
falls, uses walker in her room, power w/c once out of her room.

## 2021-11-23 NOTE — Assessment & Plan Note (Signed)
Stable,  takes Pantoprazole, bloated at times. Hgb 13.0 10/19/21

## 2021-11-23 NOTE — Progress Notes (Signed)
Location:   [place] Room Number: 810 Place of Service:  [facility] (13) Provider: Lennie Odor Shanterria Franta NP  Braelen Sproule X, NP  Patient Care Team: Derya Dettmann X, NP as PCP - General (Internal Medicine) Donato Heinz, MD as PCP - Cardiology (Cardiology) Suprina Mandeville X, NP as Nurse Practitioner (Internal Medicine) Keene Breath., MD (Ophthalmology) Danella Sensing, MD as Consulting Physician (Dermatology) Danella Sensing, MD as Consulting Physician (Dermatology) Delila Pereyra, MD as Consulting Physician (Gynecology)  Extended Emergency Contact Information Primary Emergency Contact: Gaetana Michaelis [place] Phone: [phone] Relation: Daughter Secondary Emergency Contact: Black,Joel [place] Phone: [phone] Relation: Son  Code Status: DNR Goals of care: Advanced Directive information    07/21/2020    3:28 PM  Advanced Directives  Does Patient Have a Medical Advance Directive? Yes  Type of Advance Directive Living will;Healthcare Power of Attorney  Does patient want to make changes to medical advance directive? No - Patient declined  Copy of [place] in Chart? Yes - validated most recent copy scanned in chart (See row information)     Chief Complaint  Patient presents with   Acute Visit    Medication review     HPI:  Pt is a 87 y.o. female seen today for an acute visit for medication review following admitting to [facility]  HTN, takes Amlodipine, Metoprolol.              Constipation, takes Colace qd.                                   GERD, takes Pantoprazole, bloated at times. 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 Takes Plavix. Atorvastatin. affected gait and vision issues, feels weak left leg.              Gait abnormality, falls, uses walker.              Chronic occasional dry cough for about a year, left side throat  tickles sometimes during eating, then cough, ? Resultant of CVA. . ST: no apparent swallowing problem.              OP, takes Fosamax, Ca, Vit D, t score -2.3 10/25/20             Depression, takes Citalopram             Pericardial effusion, resolved             Hyperlipidemia, takes Atorvastatin, LDL 58 10/19/21             OA pain, in thumbs ? Using walker/pressure             Urinary incontinence, progressing                Past Medical History:  Diagnosis Date   Anginal pain ([place]) 08/24/2010   Echo-EF =>55%,LV normal   Arthritis    Cataract 12/2006   Both eyes  [place] MD   Chest pain, unspecified 09/04/2007   Lexiscan-- EF 72%; LV normal   Constipation    Takes flax seed and honey .  Smooth Move tea.   Depression    Diverticulitis    Head injury, closed    with fall   Hypertension  Mitral valve prolapse    Osteopenia    Stroke ([place]) 09/02/2019   Past Surgical History:  Procedure Laterality Date   ANTERIOR CERVICAL DECOMP/DISCECTOMY FUSION N/A 10/23/2012   Procedure: Cervical Five to Cervical Six, Cervical Six to Cervical Seven anterior cervical decompression with fusion plating and bonegraft;  Surgeon: Hosie Spangle, MD;  Location: [facility];  Service: Neurosurgery;  Laterality: N/A;  ANTERIOR CERVICAL DECOMPRESSION/DISCECTOMY FUSION 2 LEVELS   BREAST CYST ASPIRATION  1990   benign   CARDIAC CATHETERIZATION  08/01/2010   normal coronaries   COLONOSCOPY  approx 2011   EYE SURGERY Bilateral 2011   intestinal blockage surgery  Feb. 2012   "kink in small intestine" per pt    Allergies  Allergen Reactions   Contrast Media [Iodinated Contrast Media] Hives    Hives during IVP at urology office '02, requires 13 hr prep///[email or web address]  Patient came thru ER and had a 1 hour pre-medication and did fine   Oxycodone Other (See Comments)    Decreased appetite.   Boniva [Ibandronic Acid]     Nausea, vomiting, weakness   Codeine Diarrhea and Nausea And Vomiting   Darvocet  [Propoxyphene N-Acetaminophen] Diarrhea and Nausea And Vomiting   Erythromycin Nausea And Vomiting   Other Other (See Comments)    Allergies as of 11/23/2021       Reactions   Contrast Media [iodinated Contrast Media] Hives   Hives during IVP at urology office '02, requires 13 hr prep///[email or web address] Patient came thru ER and had a 1 hour pre-medication and did fine   Oxycodone Other (See Comments)   Decreased appetite.   Boniva [ibandronic Acid]    Nausea, vomiting, weakness   Codeine Diarrhea, Nausea And Vomiting   Darvocet [propoxyphene N-acetaminophen] Diarrhea, Nausea And Vomiting   Erythromycin Nausea And Vomiting   Other Other (See Comments)        Medication List        Accurate as of November 23, 2021 11:59 PM. If you have any questions, ask your nurse or doctor.          alendronate 70 MG tablet Commonly known as: FOSAMAX TAKE 1 TAB ONCE A WEEK, AT LEAST 30 MIN BEFORE 1ST FOOD.DO NOT LIE DOWN FOR 30 MIN AFTER TAKING.   amLODipine 5 MG tablet Commonly known as: NORVASC Take 5 mg by mouth daily.   atorvastatin 20 MG tablet Commonly known as: LIPITOR TAKE ONE TABLET BY MOUTH ONCE DAILY.   CALCIUM 600+D3 PO Take 600 mg by mouth daily.   citalopram 20 MG tablet Commonly known as: CELEXA Take 1 tablet (20 mg total) by mouth daily.   clopidogrel 75 MG tablet Commonly known as: PLAVIX TAKE 1 TABLET ONCE DAILY.   docusate sodium 100 MG capsule Commonly known as: COLACE Take 100 mg by mouth at bedtime.   metoprolol tartrate 25 MG tablet Commonly known as: LOPRESSOR Take 12.5 mg by mouth 2 (two) times daily.   pantoprazole 40 MG tablet Commonly known as: PROTONIX TAKE 1 TABLET ONCE DAILY.        Review of Systems  Constitutional:  Negative for activity change, appetite change and fever.  HENT:  Positive for hearing loss. Negative for congestion and trouble swallowing.   Eyes:  Negative for visual disturbance.  Respiratory:  Positive for cough.  Negative for chest tightness, shortness of breath and wheezing.        Chronic occasional cough with clear phlegm.   Cardiovascular:  Negative for chest pain, palpitations  and leg swelling.  Gastrointestinal:  Negative for abdominal pain and constipation.       NV x1 Tuesday 11/10/19, then 3 loose stools today Thursday 11/12/19  Genitourinary:  Negative for dysuria, frequency and urgency.  Musculoskeletal:  Positive for arthralgias, back pain and gait problem.  Skin:  Negative for color change.  Neurological:  Positive for weakness. Negative for speech difficulty and headaches.       Memory lapses occasionally. Left sided weakness.   Psychiatric/Behavioral:  Negative for behavioral problems and sleep disturbance. The patient is not nervous/anxious.        Depressed, not interested in activities, doesn't want to live like this    Immunization History  Administered Date(s) Administered   Fluad Quad(high Dose 65+) 12/17/2018   Influenza, High Dose Seasonal PF 12/08/2016, 12/19/2020   Influenza-Unspecified 12/16/2019   Moderna Sars-Covid-2 Vaccination 03/09/2019, 04/06/2019, 01/12/2020   Pneumococcal Conjugate-13 06/12/2013   Pneumococcal Polysaccharide-23 07/28/2002   Tdap 07/27/2009   Zoster Recombinat (Shingrix) 01/08/2017, 06/03/2017   Pertinent  Health Maintenance Due  Topic Date Due   INFLUENZA VACCINE  10/03/2021   DEXA SCAN  Completed      10/26/2020    3:16 PM 02/22/2021    2:02 PM 05/17/2021    2:10 PM 09/27/2021    2:10 PM 11/01/2021    1:38 PM  Fall Risk  Falls in the past year? 0 0 0 0 1  Was there an injury with Fall? 0 0 0 0 0  Fall Risk Category Calculator 0 0 0 0 1  Fall Risk Category Low Low Low Low Low  Patient Fall Risk Level Low fall risk Low fall risk Low fall risk Low fall risk Low fall risk  Patient at Risk for Falls Due to History of fall(s) History of fall(s) History of fall(s) History of fall(s) History of fall(s)  Fall risk Follow up Falls evaluation  completed;Education provided;Falls prevention discussed Falls evaluation completed;Education provided;Falls prevention discussed Falls evaluation completed     Functional Status Survey:    Vitals:   11/23/21 1400  BP: (!) 148/70  Pulse: 82  Resp: 18  Temp: 97.8 F (36.6 C)  SpO2: 99%   There is no height or weight on file to calculate BMI. Physical Exam Vitals and nursing note reviewed.  Constitutional:      Appearance: Normal appearance.  HENT:     Head: Normocephalic and atraumatic.     Mouth/Throat:     Mouth: Mucous membranes are moist.  Eyes:     Extraocular Movements: Extraocular movements intact.     Conjunctiva/sclera: Conjunctivae normal.     Pupils: Pupils are equal, round, and reactive to light.     Comments: Lateral right eye peripheral vision field loss since CVA 11/2018  Cardiovascular:     Rate and Rhythm: Normal rate and regular rhythm.     Heart sounds: No murmur heard. Pulmonary:     Effort: Pulmonary effort is normal.     Breath sounds: No wheezing, rhonchi or rales.  Abdominal:     General: Bowel sounds are normal.     Palpations: Abdomen is soft.     Tenderness: There is no abdominal tenderness.  Musculoskeletal:     Cervical back: Normal range of motion and neck supple.     Right lower leg: No edema.     Left lower leg: No edema.     Comments: Kyphoscoliosis.   Skin:    General: Skin is warm and dry.  Neurological:  General: No focal deficit present.     Mental Status: She is alert and oriented to person, place, and time. Mental status is at baseline.     Cranial Nerves: No cranial nerve deficit.     Motor: Weakness present.     Coordination: Coordination normal.     Gait: Gait abnormal.     Deep Tendon Reflexes: Reflexes normal.     Comments: Walker for ambulation. Left sided weakness with muscle strength 5/5.  Psychiatric:        Mood and Affect: Mood normal.        Behavior: Behavior normal.     Labs reviewed: No results for  input(s): "NA", "K", "CL", "CO2", "GLUCOSE", "BUN", "CREATININE", "CALCIUM", "MG", "PHOS" in the last 8760 hours. No results for input(s): "AST", "ALT", "ALKPHOS", "BILITOT", "PROT", "ALBUMIN" in the last 8760 hours. Recent Labs    10/19/21 0729  WBC 5.4  NEUTROABS 3,397  HGB 13.0  HCT 38.0  MCV 92.0  PLT 239   Lab Results  Component Value Date   TSH 4.05 06/30/2019   Lab Results  Component Value Date   HGBA1C 5.3 09/03/2019   Lab Results  Component Value Date   CHOL 115 10/19/2021   HDL 43 (L) 10/19/2021   LDLCALC 58 10/19/2021   TRIG 67 10/19/2021   CHOLHDL 2.7 10/19/2021    Significant Diagnostic Results in last 30 days:  No results found.  Assessment/Plan: Hypertension Blood pressure is controlled, takes Amlodipine, Metoprolol.   Slow transit constipation Stable, takes Colace qd.          GERD (gastroesophageal reflux disease) Stable,  takes Pantoprazole, bloated at times. 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 Takes Plavix. Atorvastatin. affected gait and vision issues, feels weak left leg.   Gait abnormality  falls, uses walker in her room, power w/c once out of her room.   Cough Chronic occasional dry cough for about a year, left side throat tickles sometimes during eating, then cough, ? Resultant of CVA. . ST: no apparent swallowing problem.   Osteoporosis  takes Fosamax, Ca, Vit D, t score -2.3 10/25/20  Depression with anxiety Her mood is stable, takes Citalopram  Hyperlipidemia  takes Atorvastatin, LDL 58 10/19/21  Generalized osteoarthritis of multiple sites in thumbs ? Using walker/pressure  Incontinent of urine Ongoing issue, managed with adult depends.     Family/ staff Communication: plan of care reviewed with the patient and charge nurse.   Labs/tests ordered: none  Time spend 40 minutes.

## 2021-11-23 NOTE — Assessment & Plan Note (Signed)
takes Atorvastatin, LDL 58 10/19/21 

## 2021-11-23 NOTE — Assessment & Plan Note (Signed)
Her mood is stable, takes Citalopram

## 2021-11-23 NOTE — Assessment & Plan Note (Signed)
in thumbs ? Using walker/pressure

## 2021-11-23 NOTE — Assessment & Plan Note (Signed)
Ongoing issue, managed with adult depends.

## 2021-11-23 NOTE — Assessment & Plan Note (Signed)
Stable, takes Colace qd 

## 2021-11-23 NOTE — Assessment & Plan Note (Signed)
takes Fosamax, Ca, Vit D, t score -2.3 10/25/20 

## 2021-11-28 ENCOUNTER — Encounter: Payer: Self-pay | Admitting: Nurse Practitioner

## 2021-11-29 DIAGNOSIS — R29898 Other symptoms and signs involving the musculoskeletal system: Secondary | ICD-10-CM | POA: Diagnosis not present

## 2021-11-29 DIAGNOSIS — M6281 Muscle weakness (generalized): Secondary | ICD-10-CM | POA: Diagnosis not present

## 2021-12-05 ENCOUNTER — Non-Acute Institutional Stay: Payer: Medicare PPO | Admitting: Family Medicine

## 2021-12-05 DIAGNOSIS — M810 Age-related osteoporosis without current pathological fracture: Secondary | ICD-10-CM

## 2021-12-05 DIAGNOSIS — E7800 Pure hypercholesterolemia, unspecified: Secondary | ICD-10-CM

## 2021-12-05 DIAGNOSIS — I10 Essential (primary) hypertension: Secondary | ICD-10-CM

## 2021-12-05 DIAGNOSIS — R053 Chronic cough: Secondary | ICD-10-CM

## 2021-12-05 DIAGNOSIS — R269 Unspecified abnormalities of gait and mobility: Secondary | ICD-10-CM

## 2021-12-05 DIAGNOSIS — Z8673 Personal history of transient ischemic attack (TIA), and cerebral infarction without residual deficits: Secondary | ICD-10-CM

## 2021-12-05 NOTE — Progress Notes (Signed)
Provider:  Jacalyn Lefevre, MD Location:      Place of Service:     PCP: [name], [name], NP Patient Care Team: [name], [name], NP as PCP - General (Internal Medicine) Little Ishikawa, MD as PCP - Cardiology (Cardiology) [name], [name], NP as Nurse Practitioner (Internal Medicine) Marzella Schlein., MD (Ophthalmology) Arminda Resides, MD as Consulting Physician (Dermatology) Arminda Resides, MD as Consulting Physician (Dermatology) Teodora Medici, MD as Consulting Physician (Gynecology)  Extended Emergency Contact Information Primary Emergency Contact: Aline Brochure [place] Phone: [phone] Relation: Daughter Secondary Emergency Contact: Black,Joel Mobile Phone: [phone] Relation: Son  Code Status:  Goals of Care: Advanced Directive information    07/21/2020    3:28 PM  Advanced Directives  Does Patient Have a Medical Advance Directive? Yes  Type of Advance Directive Living will;Healthcare Power of Attorney  Does patient want to make changes to medical advance directive? No - Patient declined  Copy of Healthcare Power of Attorney in Chart? Yes - validated most recent copy scanned in chart (See row information)      No chief complaint on file.   HPI: Patient is a 87 y.o. female seen today for admission to [facility].  This lady is well-known to me as I was her primary care physician [facility] she [facility] living in dependently.  With increasing weakness and recent fall and knowing that symptoms may progress over time she decided to make the move to assisted living.  In talking with her today, even though things are not quite settled as she would like she seems in a good place with this move.  She is still going to get her piano moved to the living room on the floor where she lives so that she and other residents can enjoy playing.  She is having no new health symptoms.  We did discuss briefly some exercises  that she might do to strengthen her left leg. I have seen her before for problems related to depression and anxiety, cough, gait abnormality  Past Medical History:  Diagnosis Date   Anginal pain (HCC) 08/24/2010   Echo-EF =>55%,LV normal   Arthritis    Cataract 12/2006   Both eyes  Epes MD   Chest pain, unspecified 09/04/2007   Lexiscan-- EF 72%; LV normal   Constipation    Takes flax seed and honey .  Smooth Move tea.   Depression    Diverticulitis    Head injury, closed    with fall   Hypertension    Mitral valve prolapse    Osteopenia    Stroke (HCC) 09/02/2019   Past Surgical History:  Procedure Laterality Date   ANTERIOR CERVICAL DECOMP/DISCECTOMY FUSION N/A 10/23/2012   Procedure: Cervical Five to Cervical Six, Cervical Six to Cervical Seven anterior cervical decompression with fusion plating and bonegraft;  Surgeon: Hewitt Shorts, MD;  Location: [facility];  Service: Neurosurgery;  Laterality: N/A;  ANTERIOR CERVICAL DECOMPRESSION/DISCECTOMY FUSION 2 LEVELS   BREAST CYST ASPIRATION  1990   benign   CARDIAC CATHETERIZATION  08/01/2010   normal coronaries   COLONOSCOPY  approx 2011   EYE SURGERY Bilateral 2011   intestinal blockage surgery  Feb. 2012   "kink in small intestine" per pt    reports that she has never smoked. She has never used smokeless tobacco. She reports that she does not currently use alcohol after a past usage of about 4.0 standard  drinks of alcohol per week. She reports that she does not use drugs. Social History   Socioeconomic History   Marital status: Widowed    Spouse name: Joe   Number of children: 2   Years of education: college   Highest education level: Bachelor's degree (e.g., BA, AB, BS)  Occupational History    Comment: retired Runner, broadcasting/film/videoteacher  Tobacco Use   Smoking status: Never   Smokeless tobacco: Never  Vaping Use   Vaping Use: Never used  Substance and Sexual Activity   Alcohol use: Not Currently    Alcohol/week: 4.0 standard  drinks of alcohol    Types: 2 Glasses of wine, 2 Cans of beer per week    Comment: occasionally   Drug use: No   Sexual activity: Yes  Other Topics Concern   Not on file  Social History Narrative   Diet:  Normal   Do you drink/eat things with caffeine?   Yes   Marital status:  Widow                            What year were you married? 1986 and 1960   Do you live in a house, apartment, assisted living, condo, trailer, etc)?  [facility]   Is it one or more stories? 1+   How many persons live in your home?  1   Do you have any pets in your home?  No   Current or past profession:  Runner, broadcasting/film/videoTeacher, Environmental health practitionerAdministrative assistant at [employer]   Do you exercise?   Yes                                                 Type & how often: Stretching,  Strengthening   Do you have a living will?  Yes   Do you have a DNR Form?   Do you have a POA/HPOA forms?  HPOA   Social Determinants of Health   Financial Resource Strain: Low Risk  (09/03/2017)   Overall Financial Resource Strain (CARDIA)    Difficulty of Paying Living Expenses: Not hard at all  Food Insecurity: No Food Insecurity (09/03/2017)   Hunger Vital Sign    Worried About Running Out of Food in the Last Year: Never true    Ran Out of Food in the Last Year: Never true  Transportation Needs: No Transportation Needs (09/03/2017)   PRAPARE - Administrator, Civil ServiceTransportation    Lack of Transportation (Medical): No    Lack of Transportation (Non-Medical): No  Physical Activity: Sufficiently Active (09/03/2017)   Exercise Vital Sign    Days of Exercise per Week: 7 days    Minutes of Exercise per Session: 40 min  Stress: Stress Concern Present (09/03/2017)   [employer] - Occupational Stress Questionnaire    Feeling of Stress : To some extent  Social Connections: Somewhat Isolated (09/03/2017)   Social Connection and Isolation Panel [NHANES]    Frequency of Communication with Friends and Family: More than three  times a week    Frequency of Social Gatherings with Friends and Family: More than three times a week    Attends Religious Services: Never    Database administratorActive Member of Clubs or Organizations: No    Attends BankerClub or Organization Meetings: Never    Marital Status:  Married  Intimate Partner Violence: Not At Risk (09/03/2017)   Humiliation, Afraid, Rape, and Kick questionnaire    Fear of Current or Ex-Partner: No    Emotionally Abused: No    Physically Abused: No    Sexually Abused: No    Functional Status Survey:    Family History  Problem Relation Age of Onset   Hypertension Mother    Transient ischemic attack Mother    Parkinson's disease Mother    Dementia Mother    CVA Mother    Diabetes Father    Coronary artery disease Father    Heart failure Father    Cancer Father        prostate   Depression Father    Congestive Heart Failure Father    Prostate cancer Brother    Diabetes Brother    Prostate cancer Paternal Grandmother     Health Maintenance  Topic Date Due   TETANUS/TDAP  07/28/2019   COVID-19 Vaccine (4 - Moderna series) 03/08/2020   INFLUENZA VACCINE  10/03/2021   Pneumonia Vaccine 37+ Years old  Completed   DEXA SCAN  Completed   Zoster Vaccines- Shingrix  Completed   HPV VACCINES  Aged Out    Allergies  Allergen Reactions   Contrast Media [Iodinated Contrast Media] Hives    Hives during IVP at urology office '02, requires 13 hr prep///a.calhoun  Patient came thru ER and had a 1 hour pre-medication and did fine   Oxycodone Other (See Comments)    Decreased appetite.   Boniva [Ibandronic Acid]     Nausea, vomiting, weakness   Codeine Diarrhea and Nausea And Vomiting   Darvocet [Propoxyphene N-Acetaminophen] Diarrhea and Nausea And Vomiting   Erythromycin Nausea And Vomiting   Other Other (See Comments)    Outpatient Encounter Medications as of 12/05/2021  Medication Sig   alendronate (FOSAMAX) 70 MG tablet TAKE 1 TAB ONCE A WEEK, AT LEAST 30 MIN BEFORE 1ST  FOOD.DO NOT LIE DOWN FOR 30 MIN AFTER TAKING.   amLODipine (NORVASC) 5 MG tablet Take 5 mg by mouth daily.   atorvastatin (LIPITOR) 20 MG tablet TAKE ONE TABLET BY MOUTH ONCE DAILY.   Calcium Carb-Cholecalciferol (CALCIUM 600+D3 PO) Take 600 mg by mouth daily.    citalopram (CELEXA) 20 MG tablet Take 1 tablet (20 mg total) by mouth daily.   clopidogrel (PLAVIX) 75 MG tablet TAKE 1 TABLET ONCE DAILY.   docusate sodium (COLACE) 100 MG capsule Take 100 mg by mouth at bedtime.   metoprolol tartrate (LOPRESSOR) 25 MG tablet Take 12.5 mg by mouth 2 (two) times daily.   pantoprazole (PROTONIX) 40 MG tablet TAKE 1 TABLET ONCE DAILY.   No facility-administered encounter medications on file as of 12/05/2021.    Review of Systems  Constitutional:  Positive for activity change.  Eyes:  Positive for visual disturbance.  Respiratory: Negative.    Cardiovascular: Negative.   Gastrointestinal: Negative.   Genitourinary:  Positive for frequency.  Psychiatric/Behavioral:  Positive for sleep disturbance.   All other systems reviewed and are negative.   There were no vitals filed for this visit. There is no height or weight on file to calculate BMI. Physical Exam Vitals and nursing note reviewed.  Constitutional:      Appearance: Normal appearance.  HENT:     Head: Normocephalic.     Right Ear: Tympanic membrane normal.     Left Ear: Tympanic membrane normal.     Mouth/Throat:     Mouth: Mucous membranes are  moist.  Eyes:     Extraocular Movements: Extraocular movements intact.     Pupils: Pupils are equal, round, and reactive to light.  Cardiovascular:     Rate and Rhythm: Normal rate and regular rhythm.     Heart sounds: Normal heart sounds.  Pulmonary:     Effort: Pulmonary effort is normal.     Breath sounds: Normal breath sounds.  Abdominal:     General: Abdomen is flat. Bowel sounds are normal.  Musculoskeletal:        General: Normal range of motion.  Skin:    General: Skin is  dry.  Neurological:     General: No focal deficit present.     Mental Status: She is oriented to person, place, and time.  Psychiatric:        Mood and Affect: Mood normal.        Behavior: Behavior normal.        Thought Content: Thought content normal.        Judgment: Judgment normal.     Labs reviewed: Basic Metabolic Panel: No results for input(s): "NA", "K", "CL", "CO2", "GLUCOSE", "BUN", "CREATININE", "CALCIUM", "MG", "PHOS" in the last 8760 hours. Liver Function Tests: No results for input(s): "AST", "ALT", "ALKPHOS", "BILITOT", "PROT", "ALBUMIN" in the last 8760 hours. No results for input(s): "LIPASE", "AMYLASE" in the last 8760 hours. No results for input(s): "AMMONIA" in the last 8760 hours. CBC: Recent Labs    10/19/21 0729  WBC 5.4  NEUTROABS 3,397  HGB 13.0  HCT 38.0  MCV 92.0  PLT 239   Cardiac Enzymes: No results for input(s): "CKTOTAL", "CKMB", "CKMBINDEX", "TROPONINI" in the last 8760 hours. BNP: Invalid input(s): "POCBNP" Lab Results  Component Value Date   HGBA1C 5.3 09/03/2019   Lab Results  Component Value Date   TSH 4.05 06/30/2019   No results found for: "VITAMINB12" No results found for: "FOLATE" No results found for: "IRON", "TIBC", "FERRITIN"  Imaging and Procedures obtained prior to SNF admission: DG MOBILE BONE DENSITY  Result Date: 10/26/2020 CLINICAL DATA:  Postmenopausal. Patient could not tolerate forearm imaging. History of prior vertebral fractures. EXAM: DUAL X-RAY ABSORPTIOMETRY (DXA) FOR BONE MINERAL DENSITY TECHNIQUE: Bone mineral density measurements are performed of the spine, hip, and forearm, as appropriate, per International Society of Clinical Densitometry recommendations. The pertinent regions of interest are reported below. Non-contributory values are not reported. Images are obtained for bone mineral density measurement and are not obtained for diagnostic purposes. FINDINGS: LEFT FEMUR NECK Bone Mineral Density (BMD):   0.594 g/cm2 Young Adult T-Score: -2.3 Z-Score:  0.2 Unit: This study was performed at [facility] on the [place] (S/N [phone]), software version 13.4.2. Scan quality: The scan quality is good. Exclusions: Lumbar spine excluded due to scoliosis and degenerative change. ASSESSMENT: Patient's diagnostic category is LOW BONE MASS/OSTEOPENIA by [employer] Criteria. FRACTURE RISK: INCREASED FRAX: World Health Organization FRAX assessment of absolute fracture risk is not calculated for this patient because the patient has a history of prior vertebral fracture. COMPARISON: None. RECOMMENDATIONS 1. All patients should optimize calcium and vitamin D intake. 2. Consider FDA-approved medical therapies in postmenopausal women and men aged 31 years and older, based on the following: - A hip or vertebral (clinical or morphometric) fracture - T-score less than or equal to -2.5 at the femoral neck or spine after appropriate evaluation to exclude secondary causes - Low bone mass (T-score between -1.0 and -2.5 at the femoral neck or spine) and a 10-year probability  of a hip fracture greater than or equal to 3% or a 10-year probability of a major osteoporosis-related fracture greater than or equal to 20% based on the [place]-adapted WHO algorithm - Clinician judgment and/or patient preferences may indicate treatment for people with 10-year fracture probabilities above or below these levels 3. Patients with diagnosis of osteoporosis or at high risk for fracture should have regular bone mineral density tests. For patients eligible for Medicare, routine testing is allowed once every 2 years. The testing frequency can be increased to one year for patients who have rapidly progressing disease, those who are receiving or discontinuing medical therapy to restore bone mass, or have additional risk factors. Electronically Signed   By: Amie Portland M.D.   On: 10/26/2020 08:00    Assessment/Plan 1. Chronic cough Cough has been related  to use of ACE.  Then switch to ARB but cough has persisted.  Have discontinued ARB in favor of increase amlodipine  2. Primary hypertension Blood pressures are usually well controlled on amlodipine and metoprolol  3. Age-related osteoporosis without current pathological fracture Currently patient takes alendronate along with calcium and vitamin D supplement  4. Pure hypercholesterolemia Continues with atorvastatin 20 mg.  Lipids were last assessed 2 months ago with LDL at target  5. Gait abnormality Uses her walker around the room but a scooter when she needs to travel some distance like the dining room.  Did discuss some exercises that she might try to increase strength in her left lower extremity  6. History of CVA (cerebrovascular accident) Strokes are old.  There have been 2.  1 left her with some left-sided weakness the other with some visual difficulties    Family/ staff Communication: no  Bertram Millard. Hyacinth Meeker, MD [facility] [address] [place], [place] [address] Office [phone]

## 2022-01-02 DIAGNOSIS — L602 Onychogryphosis: Secondary | ICD-10-CM | POA: Diagnosis not present

## 2022-01-22 DIAGNOSIS — F330 Major depressive disorder, recurrent, mild: Secondary | ICD-10-CM | POA: Diagnosis not present

## 2022-01-26 ENCOUNTER — Emergency Department (HOSPITAL_COMMUNITY): Payer: Medicare PPO

## 2022-01-26 ENCOUNTER — Other Ambulatory Visit: Payer: Self-pay

## 2022-01-26 ENCOUNTER — Observation Stay (HOSPITAL_COMMUNITY)
Admission: EM | Admit: 2022-01-26 | Discharge: 2022-01-28 | Disposition: A | Discharge status: Other Acute Inpatient Hospital | Payer: Medicare PPO | Attending: Family Medicine | Admitting: Family Medicine

## 2022-01-26 DIAGNOSIS — M19012 Primary osteoarthritis, left shoulder: Secondary | ICD-10-CM | POA: Diagnosis not present

## 2022-01-26 DIAGNOSIS — Z743 Need for continuous supervision: Secondary | ICD-10-CM | POA: Diagnosis not present

## 2022-01-26 DIAGNOSIS — I2699 Other pulmonary embolism without acute cor pulmonale: Principal | ICD-10-CM | POA: Diagnosis present

## 2022-01-26 DIAGNOSIS — Z7902 Long term (current) use of antithrombotics/antiplatelets: Secondary | ICD-10-CM | POA: Diagnosis not present

## 2022-01-26 DIAGNOSIS — I3139 Other pericardial effusion (noninflammatory): Secondary | ICD-10-CM | POA: Diagnosis present

## 2022-01-26 DIAGNOSIS — I2694 Multiple subsegmental pulmonary emboli without acute cor pulmonale: Secondary | ICD-10-CM | POA: Diagnosis not present

## 2022-01-26 DIAGNOSIS — R0789 Other chest pain: Secondary | ICD-10-CM | POA: Diagnosis not present

## 2022-01-26 DIAGNOSIS — Z9181 History of falling: Secondary | ICD-10-CM | POA: Diagnosis not present

## 2022-01-26 DIAGNOSIS — I10 Essential (primary) hypertension: Secondary | ICD-10-CM | POA: Diagnosis present

## 2022-01-26 DIAGNOSIS — Z79899 Other long term (current) drug therapy: Secondary | ICD-10-CM | POA: Insufficient documentation

## 2022-01-26 DIAGNOSIS — R079 Chest pain, unspecified: Secondary | ICD-10-CM | POA: Diagnosis not present

## 2022-01-26 DIAGNOSIS — N644 Mastodynia: Secondary | ICD-10-CM | POA: Diagnosis not present

## 2022-01-26 DIAGNOSIS — N281 Cyst of kidney, acquired: Secondary | ICD-10-CM | POA: Diagnosis not present

## 2022-01-26 DIAGNOSIS — Z66 Do not resuscitate: Secondary | ICD-10-CM | POA: Diagnosis not present

## 2022-01-26 DIAGNOSIS — E876 Hypokalemia: Secondary | ICD-10-CM | POA: Insufficient documentation

## 2022-01-26 DIAGNOSIS — R2681 Unsteadiness on feet: Secondary | ICD-10-CM | POA: Diagnosis not present

## 2022-01-26 DIAGNOSIS — M25512 Pain in left shoulder: Secondary | ICD-10-CM | POA: Diagnosis not present

## 2022-01-26 DIAGNOSIS — Z8673 Personal history of transient ischemic attack (TIA), and cerebral infarction without residual deficits: Secondary | ICD-10-CM | POA: Diagnosis not present

## 2022-01-26 DIAGNOSIS — F418 Other specified anxiety disorders: Secondary | ICD-10-CM | POA: Diagnosis present

## 2022-01-26 LAB — BASIC METABOLIC PANEL
Anion gap: 9 (ref 5–15)
BUN: 13 mg/dL (ref 8–23)
CO2: 25 mmol/L (ref 22–32)
Calcium: 9.6 mg/dL (ref 8.9–10.3)
Chloride: 104 mmol/L (ref 98–111)
Creatinine, Ser: 0.86 mg/dL (ref 0.44–1.00)
GFR, Estimated: >60 mL/min (ref >60)
Glucose, Bld: 125 mg/dL — ABNORMAL HIGH (ref 70–99)
Potassium: 3.3 mmol/L — ABNORMAL LOW (ref 3.5–5.1)
Sodium: 138 mmol/L (ref 135–145)

## 2022-01-26 LAB — CBC
HCT: 38.5 % (ref 36.0–46.0)
Hemoglobin: 13.1 g/dL (ref 12.0–15.0)
MCH: 31.7 pg (ref 26.0–34.0)
MCHC: 34 g/dL (ref 30.0–36.0)
MCV: 93.2 fL (ref 80.0–100.0)
Platelets: 233 10*3/uL (ref 150–400)
RBC: 4.13 MIL/uL (ref 3.87–5.11)
RDW: 12.5 % (ref 11.5–15.5)
WBC: 11.9 10*3/uL — ABNORMAL HIGH (ref 4.0–10.5)
nRBC: 0 % (ref 0.0–0.2)

## 2022-01-26 LAB — TROPONIN I (HIGH SENSITIVITY): Troponin I (High Sensitivity): 9 ng/L (ref <18)

## 2022-01-26 NOTE — ED Triage Notes (Signed)
BIB EMS from [facility].  At 3am yesterday she started experiencing 10/10 chest pain. Points to epigastric and under left breast to left shoulder  Pain is worse with inspiration and position.  Has had previous CVA x 2 .

## 2022-01-26 NOTE — ED Provider Triage Note (Signed)
Emergency Medicine Provider Triage Evaluation Note  Adriana [name] , a 87 y.o. female  was evaluated in triage.  Pt complains of central to left-sided chest pain for the last day and a half.  Patient reports that it was sharp in nature, worse with inspiration, not associated with exertion. Endorses hypertension but denies HLD, DM2, tobacco use.  Additionally concerned about possible left shoulder injury as patient reports that she may have injured it while pulling a door closed in her home.  Patient denies nausea, vomiting, she does report that the pain radiates to her neck, and left arm, but denies significant radiation to the back although she reports some chronic back pain.  She had 2 strokes over the last 2 years, is currently taking Plavix.  Review of Systems  Positive: Chest pain, pain with inspiration, shoulder pain Negative: Nausea, vomiting  Physical Exam  There were no vitals taken for this visit. Gen:   Awake, no distress   Resp:  Normal effort  MSK:   Moves extremities without difficulty  Other:  Normal heart rate and rhythm, no step off or deformity of LUE, neurovascularly intact throughout  Medical Decision Making  Medically screening exam initiated at 10:16 PM.  Appropriate orders placed.  [name] was informed that the remainder of the evaluation will be completed by another provider, this initial triage assessment does not replace that evaluation, and the importance of remaining in the ED until their evaluation is complete.  Workup initiated   Olene Floss, [place] 01/26/22 2216

## 2022-01-27 ENCOUNTER — Emergency Department (HOSPITAL_BASED_OUTPATIENT_CLINIC_OR_DEPARTMENT_OTHER): Payer: Medicare PPO

## 2022-01-27 ENCOUNTER — Encounter (HOSPITAL_COMMUNITY): Payer: Self-pay | Admitting: Internal Medicine

## 2022-01-27 ENCOUNTER — Inpatient Hospital Stay (HOSPITAL_BASED_OUTPATIENT_CLINIC_OR_DEPARTMENT_OTHER): Payer: Medicare PPO

## 2022-01-27 ENCOUNTER — Emergency Department (HOSPITAL_COMMUNITY): Payer: Medicare PPO

## 2022-01-27 DIAGNOSIS — I3139 Other pericardial effusion (noninflammatory): Secondary | ICD-10-CM

## 2022-01-27 DIAGNOSIS — I2699 Other pulmonary embolism without acute cor pulmonale: Secondary | ICD-10-CM

## 2022-01-27 DIAGNOSIS — M79605 Pain in left leg: Secondary | ICD-10-CM

## 2022-01-27 DIAGNOSIS — N281 Cyst of kidney, acquired: Secondary | ICD-10-CM | POA: Diagnosis not present

## 2022-01-27 DIAGNOSIS — R079 Chest pain, unspecified: Secondary | ICD-10-CM | POA: Diagnosis not present

## 2022-01-27 DIAGNOSIS — I2694 Multiple subsegmental pulmonary emboli without acute cor pulmonale: Secondary | ICD-10-CM | POA: Diagnosis not present

## 2022-01-27 DIAGNOSIS — Z66 Do not resuscitate: Secondary | ICD-10-CM | POA: Diagnosis present

## 2022-01-27 LAB — ECHOCARDIOGRAM COMPLETE
Height: 66 in
S' Lateral: 2.2 cm
Weight: 2128 oz

## 2022-01-27 LAB — HEPARIN LEVEL (UNFRACTIONATED)
Heparin Unfractionated: 0.95 IU/mL — ABNORMAL HIGH (ref 0.30–0.70)
Heparin Unfractionated: 0.86 IU/mL — ABNORMAL HIGH (ref 0.30–0.70)

## 2022-01-27 LAB — D-DIMER, QUANTITATIVE: D-Dimer, Quant: 1.07 ug/mL-FEU — ABNORMAL HIGH (ref 0.00–0.50)

## 2022-01-27 LAB — TROPONIN I (HIGH SENSITIVITY): Troponin I (High Sensitivity): 11 ng/L (ref <18)

## 2022-01-27 MED ORDER — ACETAMINOPHEN 325 MG PO TABS: 650.0000 mg | ORAL_TABLET | Freq: Four times a day (QID) | ORAL | Status: DC | PRN

## 2022-01-27 MED ORDER — DIPHENHYDRAMINE HCL 25 MG PO CAPS: 50.0000 mg | ORAL_CAPSULE | Freq: Once | ORAL | Status: AC

## 2022-01-27 MED ORDER — CITALOPRAM HYDROBROMIDE 10 MG PO TABS
20.0000 mg | ORAL_TABLET | Freq: Every day | ORAL | Status: DC
  Administered 2022-01-27: 20 mg via ORAL
  Filled 2022-01-27: qty 2

## 2022-01-27 MED ORDER — SODIUM CHLORIDE 0.9% FLUSH
3.0000 mL | Freq: Two times a day (BID) | INTRAVENOUS | Status: DC
  Administered 2022-01-28: 3 mL via INTRAVENOUS

## 2022-01-27 MED ORDER — HEPARIN BOLUS VIA INFUSION
3600.0000 [IU] | Freq: Once | INTRAVENOUS | Status: AC
  Administered 2022-01-27: 3600 [IU] via INTRAVENOUS
  Filled 2022-01-27: qty 3600

## 2022-01-27 MED ORDER — ACETAMINOPHEN 650 MG RE SUPP: 650.0000 mg | Freq: Four times a day (QID) | RECTAL | Status: DC | PRN

## 2022-01-27 MED ORDER — METOPROLOL TARTRATE 25 MG PO TABS
12.5000 mg | ORAL_TABLET | Freq: Two times a day (BID) | ORAL | Status: DC
  Administered 2022-01-27 – 2022-01-28 (×3): 12.5 mg via ORAL
  Filled 2022-01-27 (×3): qty 1

## 2022-01-27 MED ORDER — AMLODIPINE BESYLATE 5 MG PO TABS
5.0000 mg | ORAL_TABLET | Freq: Every day | ORAL | Status: DC
  Administered 2022-01-28: 5 mg via ORAL
  Filled 2022-01-27: qty 1

## 2022-01-27 MED ORDER — DOCUSATE SODIUM 100 MG PO CAPS
100.0000 mg | ORAL_CAPSULE | Freq: Two times a day (BID) | ORAL | Status: DC
  Administered 2022-01-27 – 2022-01-28 (×3): 100 mg via ORAL
  Filled 2022-01-27 (×3): qty 1

## 2022-01-27 MED ORDER — PANTOPRAZOLE SODIUM 40 MG PO TBEC
40.0000 mg | DELAYED_RELEASE_TABLET | Freq: Every morning | ORAL | Status: DC
  Administered 2022-01-28: 40 mg via ORAL
  Filled 2022-01-27: qty 1

## 2022-01-27 MED ORDER — HYDRALAZINE HCL 20 MG/ML IJ SOLN: 5.0000 mg | INTRAMUSCULAR | Status: DC | PRN

## 2022-01-27 MED ORDER — POLYETHYLENE GLYCOL 3350 17 G PO PACK: 17.0000 g | PACK | Freq: Every day | ORAL | Status: DC | PRN

## 2022-01-27 MED ORDER — ATORVASTATIN CALCIUM 10 MG PO TABS
20.0000 mg | ORAL_TABLET | Freq: Every morning | ORAL | Status: DC
  Administered 2022-01-28: 20 mg via ORAL
  Filled 2022-01-27: qty 2

## 2022-01-27 MED ORDER — ONDANSETRON HCL 4 MG/2ML IJ SOLN: 4.0000 mg | Freq: Four times a day (QID) | INTRAMUSCULAR | Status: DC | PRN

## 2022-01-27 MED ORDER — LACTATED RINGERS IV SOLN: INTRAVENOUS | Status: AC

## 2022-01-27 MED ORDER — CLOPIDOGREL BISULFATE 75 MG PO TABS
75.0000 mg | ORAL_TABLET | Freq: Every morning | ORAL | Status: DC
  Administered 2022-01-28: 75 mg via ORAL
  Filled 2022-01-27: qty 1

## 2022-01-27 MED ORDER — IOHEXOL 350 MG/ML SOLN
125.0000 mL | Freq: Once | INTRAVENOUS | Status: AC | PRN
  Administered 2022-01-27: 125 mL via INTRAVENOUS

## 2022-01-27 MED ORDER — POTASSIUM CHLORIDE ER 10 MEQ PO TBCR: 40.0000 meq | EXTENDED_RELEASE_TABLET | Freq: Every day | ORAL | 0 refills | Status: DC

## 2022-01-27 MED ORDER — DIPHENHYDRAMINE HCL 50 MG/ML IJ SOLN
50.0000 mg | Freq: Once | INTRAMUSCULAR | Status: AC
  Administered 2022-01-27: 50 mg via INTRAVENOUS
  Filled 2022-01-27: qty 1

## 2022-01-27 MED ORDER — ONDANSETRON HCL 4 MG PO TABS: 4.0000 mg | ORAL_TABLET | Freq: Four times a day (QID) | ORAL | Status: DC | PRN

## 2022-01-27 MED ORDER — ALBUTEROL SULFATE (2.5 MG/3ML) 0.083% IN NEBU: 2.5000 mg | INHALATION_SOLUTION | RESPIRATORY_TRACT | Status: DC | PRN

## 2022-01-27 MED ORDER — POTASSIUM CHLORIDE CRYS ER 20 MEQ PO TBCR
40.0000 meq | EXTENDED_RELEASE_TABLET | Freq: Once | ORAL | Status: AC
  Administered 2022-01-27: 40 meq via ORAL
  Filled 2022-01-27: qty 2

## 2022-01-27 MED ORDER — METHYLPREDNISOLONE SODIUM SUCC 40 MG IJ SOLR
40.0000 mg | Freq: Once | INTRAMUSCULAR | Status: AC
  Administered 2022-01-27: 40 mg via INTRAVENOUS
  Filled 2022-01-27: qty 1

## 2022-01-27 MED ORDER — BISACODYL 5 MG PO TBEC: 5.0000 mg | DELAYED_RELEASE_TABLET | Freq: Every day | ORAL | Status: DC | PRN

## 2022-01-27 MED ORDER — HEPARIN (PORCINE) 25000 UT/250ML-% IV SOLN
850.0000 [IU]/h | INTRAVENOUS | Status: AC
  Administered 2022-01-27: 1000 [IU]/h via INTRAVENOUS
  Filled 2022-01-27: qty 250

## 2022-01-27 NOTE — Progress Notes (Addendum)
ANTICOAGULATION CONSULT NOTE  Pharmacy Consult for heparin Indication: pulmonary embolus  Allergies  Allergen Reactions   Contrast Media [Iodinated Contrast Media] Hives    Hives during IVP at urology office '02, requires 13 hr prep///a.calhoun  Patient came thru ER and had a 1 hour pre-medication and did fine   Oxycontin [Oxycodone] Other (See Comments)    Decreased appetite.   Boniva [Ibandronic Acid] Nausea And Vomiting and Other (See Comments)    Weakness    Codeine Diarrhea and Nausea And Vomiting   Darvocet [Propoxyphene N-Acetaminophen] Diarrhea and Nausea And Vomiting   Erythromycin Nausea And Vomiting    Patient Measurements: Height: 5\' 6"  (167.6 cm) Weight: 60.3 kg (133 lb) IBW/kg (Calculated) : 59.3 Heparin Dosing Weight: 60.3kg  Vital Signs: Temp: 97.6 F (36.4 C) (11/25 1545) Temp Source: Oral (11/25 1545) BP: 123/87 (11/25 1915) Pulse Rate: 72 (11/25 1915)  Labs: Recent Labs    01/26/22 2223 01/27/22 0035 01/27/22 1920  HGB 13.1  --   --   HCT 38.5  --   --   PLT 233  --   --   HEPARINUNFRC  --   --  0.95*  CREATININE 0.86  --   --   TROPONINIHS 9 11  --      Estimated Creatinine Clearance: 44.8 mL/min (by C-G formula based on SCr of 0.86 mg/dL).   Medical History: Past Medical History:  Diagnosis Date   Anginal pain (HCC) 08/24/2010   Echo-EF =>55%,LV normal   Arthritis    Cataract 12/2006   Both eyes  Epes MD   Chest pain, unspecified 09/04/2007   Lexiscan-- EF 72%; LV normal   Constipation    Takes flax seed and honey .  Smooth Move tea.   Depression    Diverticulitis    Head injury, closed    with fall   History of cardiac cath 2012   normal coronary arteries   Hypertension    Mitral valve prolapse    Osteopenia    Stroke (HCC) 09/02/2019    Medications:  Infusions:   heparin 1,000 Units/hr (01/27/22 1438)   lactated ringers 50 mL/hr at 01/27/22 1436    Assessment: 87 yof presented to the ED with CP. Found to have a  PE and now starting IV heparin. Baseline CBC is WNL and she is not on anticoagulation PTA.   Heparin level came back elevated at 0.95, on 1000 units/hr. Was collected from same line as infusion - to double check accuracy, obtained peripheral draw which came back still elevated at 0.86. No s/sx of bleeding or infusion issues.  Goal of Therapy:  Heparin level 0.3-0.7 units/ml Monitor platelets by anticoagulation protocol: Yes   Plan:  Reduce heparin gtt to 850 units/hr Check an 8 hr heparin level  Daily heparin level and CBC  01/29/22, PharmD, BCCCP Clinical Pharmacist  Phone: [phone] 01/27/2022 8:04 PM  Please check AMION for all [facility] phone numbers After 10:00 PM, call Main Pharmacy [phone]

## 2022-01-27 NOTE — Progress Notes (Signed)
  Echocardiogram 2D Echocardiogram has been performed.  Adriana [name] 01/27/2022, 12:40 PM

## 2022-01-27 NOTE — ED Provider Notes (Signed)
Care of patient handed off to me at this time by Theophilus Kinds, PA-C. Please see her note for work up. Briefly, 87 year old female who presents with left sided chest pain over the past two days. It is intermittent, worse with deep inspiration. Has had SOB on exertion.   Physical Exam  BP 127/81   Pulse 96   Temp 99.4 F (37.4 C) (Oral)   Resp 19   SpO2 94%   Physical Exam Vitals and nursing note reviewed.  HENT:     Head: Normocephalic and atraumatic.  Eyes:     General: No scleral icterus. Pulmonary:     Effort: Pulmonary effort is normal. No respiratory distress.  Skin:    Findings: No rash.  Neurological:     General: No focal deficit present.     Mental Status: She is alert.  Psychiatric:        Mood and Affect: Mood normal.        Behavior: Behavior normal.        Thought Content: Thought content normal.        Judgment: Judgment normal.    Procedures  Procedures  ED Course / MDM   Clinical Course as of 01/27/22 0632  Sat Jan 27, 2022  0301 Labs notable for potassium of 3.3. She is not on diuretics. Will replace here. If she is going to be discharged, plan to place on one week of daily potassium and have her f/u with PCP for repeat. [GL]  [id number] D dimer is 1.07 which is higher than the age adjusted d dimer cutoff. We will need to obtain CTA chest to rule out PE. Patient does have a contrast allergy documented so we will premedicate with solu medrol and benadryl to obtain this [GL]  0543 Solu medrol was given at 0430. Benadryl needs to be given around 0830 prior to CT scan [GL]    Clinical Course User Index [GL] Loeffler, Finis Bud, PA-C   Medical Decision Making Amount and/or Complexity of Data Reviewed Labs: ordered. Radiology: ordered.  Risk Prescription drug management. Decision regarding hospitalization.   Cardiac work up was initiated. She had EKG Without ischemic changes. Troponin x2 negative so doubt ACS at this time. CXR:  IMPRESSION:  1.  Question  developing right middle lobe airspace opacity.  2. Bibasilar streaky airspace opacities likely representing  atelectasis.  No symptoms of pneumonia and no right sided chest pain.   DG of the left shoulder just showed degenerative changes. Remainder of labs with mild leukocytosis to 11.9. K 3.3 which was replaced. D-dimer was obtained which was elevated to 1.07. So a CTA was ordered.  She requires contrast allergy prep prior to CTA so solumedrol was given and she is to have benadryl at 0830 prior to CTA.  1039: Radiology - PE, left sided pleural effusion, cardiac effusion 1cm thickeness.  Reevaluated patient.  She still having mild symptomatic shortness of breath but she is not hypoxic.  Still having mild left-sided chest pain.  I discussed the results of her CT and need for admission.  Ordered heparin drip.  Consulted and spoke with Dr. Ophelia Charter, hospitalist who agrees to admit the patient.        Cristopher Peru, PA-C 01/27/22 1133    Rolan Bucco, MD 01/27/22 1146

## 2022-01-27 NOTE — Progress Notes (Signed)
Lower extremity venous bilateral study completed.  Preliminary results relayed to Ophelia Charter, MD at bedside.  See CV Proc for preliminary results report.   Jean Rosenthal, RDMS, RVT

## 2022-01-27 NOTE — ED Notes (Signed)
Wheeled patient to the bathroom patient did well patient is now back in bed on the monitor with family at bedside and call bell in reach bed rails up

## 2022-01-27 NOTE — ED Provider Notes (Signed)
[facility] EMERGENCY DEPARTMENT Provider Note   CSN: [id number] Arrival date & time: 01/26/22  2204     History Past medical history includes hypertension, HLD, history of CVA, GERD Chief Complaint  Patient presents with   Chest Pain    Adriana [name] is a 87 y.o. female.  Patient is presenting with a chief complaint of chest pain.  She states since about 3 AM on Thursday morning she started having some left-sided chest pain that radiated up to her left shoulder and left arm. She also describes it as going around her left breast and into her left upper back.  She describes it as throbbing.  Pain usually last for about 3 or 4 hours or so and then it will alleviate, but it seems to continue to returning.  Says it is worse with deep inspiration.  She is also had associated shortness of breath with exertion.  She is denying any abdominal pain, nausea, vomiting, fevers, chills, numbness or weakness, dizziness, leg pain or swelling, or new cough.  She has no history of blood clots or any cardiac history. No recent surgeries or hospitalizations.   Chest Pain Associated symptoms: shortness of breath        Home Medications Prior to Admission medications   Medication Sig Start Date End Date Taking? Authorizing Provider  potassium chloride (KLOR-CON) 10 MEQ tablet Take 4 tablets (40 mEq total) by mouth daily for 7 days. 01/27/22 02/03/22 Yes Terita Hejl, Finis Bud, PA-C  alendronate (FOSAMAX) 70 MG tablet TAKE 1 TAB ONCE A WEEK, AT LEAST 30 MIN BEFORE 1ST FOOD.DO NOT LIE DOWN FOR 30 MIN AFTER TAKING. 02/07/21   [name], [name], NP  amLODipine (NORVASC) 5 MG tablet Take 5 mg by mouth daily.    [provider]  atorvastatin (LIPITOR) 20 MG tablet TAKE ONE TABLET BY MOUTH ONCE DAILY. 06/15/21   [name], [name], NP  Calcium Carb-Cholecalciferol (CALCIUM 600+D3 PO) Take 600 mg by mouth daily.     [provider]  citalopram (CELEXA) 20 MG tablet Take 1 tablet (20 mg total) by  mouth daily. 10/23/21   [name], [name], NP  clopidogrel (PLAVIX) 75 MG tablet TAKE 1 TABLET ONCE DAILY. 05/16/21   [name], [name], NP  docusate sodium (COLACE) 100 MG capsule Take 100 mg by mouth at bedtime.    [provider]  metoprolol tartrate (LOPRESSOR) 25 MG tablet Take 12.5 mg by mouth 2 (two) times daily.    [provider]  pantoprazole (PROTONIX) 40 MG tablet TAKE 1 TABLET ONCE DAILY. 05/16/21   [name], [name], NP      Allergies    Contrast media [iodinated contrast media], Oxycodone, Boniva [ibandronic acid], Codeine, Darvocet [propoxyphene n-acetaminophen], Erythromycin, and Other    Review of Systems   Review of Systems  Respiratory:  Positive for shortness of breath.   Cardiovascular:  Positive for chest pain.  All other systems reviewed and are negative.   Physical Exam Updated Vital Signs BP 127/81   Pulse 96   Temp 99.4 F (37.4 C) (Oral)   Resp 19   SpO2 94%  Physical Exam Vitals and nursing note reviewed.  Constitutional:      General: She is not in acute distress.    Appearance: Normal appearance. She is not ill-appearing, toxic-appearing or diaphoretic.  HENT:     Head: Normocephalic and atraumatic.     Nose: No nasal deformity.     Mouth/Throat:     Lips:  Pink. No lesions.     Mouth: Mucous membranes are moist. No injury, lacerations, oral lesions or angioedema.     Pharynx: Oropharynx is clear. Uvula midline. No pharyngeal swelling, oropharyngeal exudate, posterior oropharyngeal erythema or uvula swelling.  Eyes:     General: Gaze aligned appropriately. No scleral icterus.       Right eye: No discharge.        Left eye: No discharge.     Conjunctiva/sclera: Conjunctivae normal.     Right eye: Right conjunctiva is not injected. No exudate or hemorrhage.    Left eye: Left conjunctiva is not injected. No exudate or hemorrhage.    Pupils: Pupils are equal, round, and reactive to light.  Neck:     Vascular: No JVD.  Cardiovascular:     Rate and  Rhythm: Normal rate and regular rhythm.     Pulses: Normal pulses.          Radial pulses are 2+ on the right side and 2+ on the left side.       Dorsalis pedis pulses are 2+ on the right side and 2+ on the left side.     Heart sounds: Normal heart sounds, S1 normal and S2 normal. Heart sounds not distant. No murmur heard.    No friction rub. No gallop. No S3 or S4 sounds.  Pulmonary:     Effort: Pulmonary effort is normal. No tachypnea, accessory muscle usage or respiratory distress.     Breath sounds: Normal breath sounds. No stridor. No decreased breath sounds, wheezing, rhonchi or rales.  Chest:     Chest wall: No tenderness.  Abdominal:     General: Abdomen is flat. There is no distension.     Palpations: Abdomen is soft. There is no mass or pulsatile mass.     Tenderness: There is no abdominal tenderness. There is no guarding or rebound.  Musculoskeletal:     Right lower leg: No tenderness. No edema.     Left lower leg: No tenderness. No edema.  Skin:    General: Skin is warm and dry.     Coloration: Skin is not jaundiced or pale.     Findings: No bruising, erythema, lesion or rash.  Neurological:     General: No focal deficit present.     Mental Status: She is alert and oriented to person, place, and time.     GCS: GCS eye subscore is 4. GCS verbal subscore is 5. GCS motor subscore is 6.  Psychiatric:        Mood and Affect: Mood normal.        Behavior: Behavior normal. Behavior is cooperative.     ED Results / Procedures / Treatments   Labs (all labs ordered are listed, but only abnormal results are displayed) Labs Reviewed  BASIC METABOLIC PANEL - Abnormal; Notable for the following components:      Result Value   Potassium 3.3 (*)    Glucose, Bld 125 (*)    All other components within normal limits  CBC - Abnormal; Notable for the following components:   WBC 11.9 (*)    All other components within normal limits  D-DIMER, QUANTITATIVE - Abnormal; Notable for the  following components:   D-Dimer, Quant 1.07 (*)    All other components within normal limits  TROPONIN I (HIGH SENSITIVITY)  TROPONIN I (HIGH SENSITIVITY)    EKG EKG Interpretation  Date/Time:  Friday January 26 2022 22:06:15 EST Ventricular Rate:  88 PR Interval:  158 QRS Duration: 62 QT Interval:  350 QTC Calculation: 423 R Axis:   10 Text Interpretation: Normal sinus rhythm Normal ECG When compared with ECG of 02-Sep-2019 10:03, No significant change was found Confirmed by Dione Booze ([id number]) on 01/27/2022 12:36:35 AM  Radiology DG Chest 2 View  Result Date: 01/26/2022 CLINICAL DATA:  chest pain, left shoulder pain EXAM: CHEST - 2 VIEW COMPARISON:  Chest x-ray 10/21/2012, CT chest 08/02/2010 FINDINGS: The heart and mediastinal contours are within normal limits. Bibasilar streaky airspace opacities likely representing atelectasis. Question developing right middle lobe airspace opacity. No pulmonary edema. No pleural effusion. No pneumothorax. No acute osseous abnormality. Please see separately dictated left shoulder radiograph 01/26/2022. IMPRESSION: 1.  Question developing right middle lobe airspace opacity. 2. Bibasilar streaky airspace opacities likely representing atelectasis. Electronically Signed   By: Tish Frederickson M.D.   On: 01/26/2022 23:20   DG Shoulder Left  Result Date: 01/26/2022 CLINICAL DATA:  Left shoulder and breast pain, initial encounter EXAM: LEFT SHOULDER - 2+ VIEW COMPARISON:  08/02/2012 FINDINGS: Degenerative changes of the acromioclavicular joint are seen. No acute fracture or dislocation is seen. The underlying bony thorax is within normal limits. IMPRESSION: Degenerative change without acute abnormality. Electronically Signed   By: Alcide Clever M.D.   On: 01/26/2022 23:13    Procedures Procedures  This patient was on telemetry or cardiac monitoring during their time in the ED.    Medications Ordered in ED Medications  diphenhydrAMINE (BENADRYL)  capsule 50 mg ( Oral See Alternative 01/27/22 0547)    Or  diphenhydrAMINE (BENADRYL) injection 50 mg (0 mg Intravenous Hold 01/27/22 0547)  potassium chloride SA (KLOR-CON M) CR tablet 40 mEq (40 mEq Oral Given 01/27/22 0305)  methylPREDNISolone sodium succinate (SOLU-MEDROL) 40 mg/mL injection 40 mg (40 mg Intravenous Given 01/27/22 0436)    ED Course/ Medical Decision Making/ A&P Clinical Course as of 01/27/22 3557  Sat Jan 27, 2022  0301 Labs notable for potassium of 3.3. She is not on diuretics. Will replace here. If she is going to be discharged, plan to place on one week of daily potassium and have her f/u with PCP for repeat. [GL]  [id number] D dimer is 1.07 which is higher than the age adjusted d dimer cutoff. We will need to obtain CTA chest to rule out PE. Patient does have a contrast allergy documented so we will premedicate with solu medrol and benadryl to obtain this [GL]  0543 Solu medrol was given at 0430. Benadryl needs to be given around 0830 prior to CT scan [GL]    Clinical Course User Index [GL] Jule Schlabach, Finis Bud, PA-C                           Medical Decision Making Amount and/or Complexity of Data Reviewed Labs: ordered. Radiology: ordered.  Risk Prescription drug management.    MDM  This is a 87 y.o. female who presents to the ED with chest pain The differential of this patient includes but is not limited to ACS/NSTEMI, Aortic Dissection, Esophageal Rupture, PE, PTX, Tamponade, Endocarditis, GERD, MSK, Pericarditis, PNA, and Dermatomal Rash  Initial Impression  Well appearing and in no acute distress. She is afebrile and has stable vitals Lungs are clear, abdomen is soft and nontender. No sign of DVT or hypervolemia on exam. There is some reproducibility of pain when I press on her chest. Exam otherwise fairly unremarkable.  I personally ordered, reviewed, and interpreted all  laboratory work and imaging and agree with Publishing copyradiologist interpretation. Results  interpreted below: WBC 11.9, not anemic, K 3.3, renal function normal, troponin negative x 2, d dimer 1.07  CXR with questionable PNA on right middle lobe L shoulder plain film normal EKG nonischemic, NSR  Assessment/Plan:  I have low suspicion for ACS given normal EKG and negative troponins.  Pain is reproducible so there is some likelihood that this is MSK related. X rays of the shoulder were negative though. Wells score is low for PE, but given pleuritic nature and other risk factors, a d dimer was obtained which was positive. We will proceed with CTA chest. Unfortunately, she has a contrast allergy, so we will premedicate her for this.  She also had mild hypokalemia. We replaced this in the ED and will plan on providing a 7 day course of potassium supplements if she is able to be discharged.  Her CXR also concerning for possible right middle lobe PNA. She really doesn't have any other symptoms for this and her pain is on the opposite side. We will reevaluate once the CT chest returns.   6:37 AM [name] and Dr. Fredderick PhenixBelfi at the end of my shift as the patient will require reassessment once labs/imaging have resulted. Patient presentation, ED course, and plan of care discussed with review of all pertinent labs and imaging. Please see his/her note for further details regarding further ED course and disposition. Plan at time of handoff is f/u on CTA chest imaging. Dispo accordingly. This may be altered or completely changed at the discretion of the oncoming team pending results of further workup.    Charting Requirements Additional history is obtained from:  Independent historian and Child External Records from outside source obtained and reviewed including: Recent Senior Care note from 12/2021 Social Determinants of Health:   Lives in Nursing Facility Pertinant PMH that complicates patient's illness: HTN, Hx CVA  Patient Care Problems that were addressed during  this visit: - Atypical Chest Pain: Acute illness with systemic symptoms - Hypokalemia: Acute illness This patient was maintained on a cardiac monitor/telemetry. I personally viewed and interpreted the cardiac monitor which reveals an underlying rhythm of NSR Medications given in ED: Kcl 40 mEq Reevaluation of the patient after these medicines showed that the patient stayed the same I have reviewed home medications and made changes accordingly.  Critical Care Interventions: n/a Consultations: n/a Disposition: see oncoming provider note  This is a shared visit with my attending physician, Dr. Preston FleetingGlick.  We have discussed this patient and they have independently evaluated this patient. The plan was altered or changed as needed.  Portions of this note were generated with Scientist, clinical (histocompatibility and immunogenetics)Dragon dictation software. Dictation errors may occur despite best attempts at proofreading.    Final Clinical Impression(s) / ED Diagnoses Final diagnoses:  Atypical chest pain  Hypokalemia    Rx / DC Orders ED Discharge Orders          Ordered    potassium chloride (KLOR-CON) 10 MEQ tablet  Daily        01/27/22 0514              Therese SarahLoeffler, Yanice Maqueda C, PA-C 01/27/22 [id number]    Dione BoozeGlick, David, MD 01/27/22 2245

## 2022-01-27 NOTE — Discharge Instructions (Addendum)
You have been seen in the ED for chest pain.   You were found to have low potassium. I have sent a prescription of potassium supplements for you to take over the next 7 days. Please have your labs rechecked in about two weeks by your PCP.  Information on my medicine - ELIQUIS (apixaban)  Why was Eliquis prescribed for you? Eliquis was prescribed to treat blood clots that may have been found in the veins of your legs (deep vein thrombosis) or in your lungs (pulmonary embolism) and to reduce the risk of them occurring again.  What do You need to know about Eliquis ? The starting dose is 10 mg (two 5 mg tablets) taken TWICE daily for the FIRST SEVEN (7) DAYS, then on (02/04/2022)  Sunday December 3rd  the dose is reduced to ONE 5 mg tablet taken TWICE daily.  Eliquis may be taken with or without food.   Try to take the dose about the same time in the morning and in the evening. If you have difficulty swallowing the tablet whole please discuss with your pharmacist how to take the medication safely.  Take Eliquis exactly as prescribed and DO NOT stop taking Eliquis without talking to the doctor who prescribed the medication.  Stopping may increase your risk of developing a new blood clot.  Refill your prescription before you run out.  After discharge, you should have regular check-up appointments with your healthcare provider that is prescribing your Eliquis.    What do you do if you miss a dose? If a dose of ELIQUIS is not taken at the scheduled time, take it as soon as possible on the same day and twice-daily administration should be resumed. The dose should not be doubled to make up for a missed dose.  Important Safety Information A possible side effect of Eliquis is bleeding. You should call your healthcare provider right away if you experience any of the following: Bleeding from an injury or your nose that does not stop. Unusual colored urine (red or dark brown) or unusual colored  stools (red or black). Unusual bruising for unknown reasons. A serious fall or if you hit your head (even if there is no bleeding).  Some medicines may interact with Eliquis and might increase your risk of bleeding or clotting while on Eliquis. To help avoid this, consult your healthcare provider or pharmacist prior to using any new prescription or non-prescription medications, including herbals, vitamins, non-steroidal anti-inflammatory drugs (NSAIDs) and supplements.  This website has more information on Eliquis (apixaban): http://www.eliquis.com/eliquis/home

## 2022-01-27 NOTE — Progress Notes (Signed)
ANTICOAGULATION CONSULT NOTE - Initial Consult  Pharmacy Consult for heparin Indication: pulmonary embolus  Allergies  Allergen Reactions   Contrast Media [Iodinated Contrast Media] Hives    Hives during IVP at urology office '02, requires 13 hr prep///a.calhoun  Patient came thru ER and had a 1 hour pre-medication and did fine   Oxycontin [Oxycodone] Other (See Comments)    Decreased appetite.   Boniva [Ibandronic Acid] Nausea And Vomiting and Other (See Comments)    Weakness    Codeine Diarrhea and Nausea And Vomiting   Darvocet [Propoxyphene N-Acetaminophen] Diarrhea and Nausea And Vomiting   Erythromycin Nausea And Vomiting    Patient Measurements: Height: 5\' 6"  (167.6 cm) Weight: 60.3 kg (133 lb) IBW/kg (Calculated) : 59.3 Heparin Dosing Weight: 60.3kg  Vital Signs: Temp: 98.1 F (36.7 C) (11/25 0745) Temp Source: Oral (11/25 0745) BP: 146/84 (11/25 1000) Pulse Rate: 99 (11/25 1000)  Labs: Recent Labs    01/26/22 2223 01/27/22 0035  HGB 13.1  --   HCT 38.5  --   PLT 233  --   CREATININE 0.86  --   TROPONINIHS 9 11    Estimated Creatinine Clearance: 44.8 mL/min (by C-G formula based on SCr of 0.86 mg/dL).   Medical History: Past Medical History:  Diagnosis Date   Anginal pain (HCC) 08/24/2010   Echo-EF =>55%,LV normal   Arthritis    Cataract 12/2006   Both eyes  Epes MD   Chest pain, unspecified 09/04/2007   Lexiscan-- EF 72%; LV normal   Constipation    Takes flax seed and honey .  Smooth Move tea.   Depression    Diverticulitis    Head injury, closed    with fall   Hypertension    Mitral valve prolapse    Osteopenia    Stroke (HCC) 09/02/2019    Medications:  Infusions:   heparin      Assessment: 87 yof presented to the ED with CP. Found to have a PE and now starting IV heparin. Baseline CBC is WNL and she is not on anticoagulation PTA.   Goal of Therapy:  Heparin level 0.3-0.7 units/ml Monitor platelets by anticoagulation  protocol: Yes   Plan:  Heparin bolus 3600 units IV x 1 Heparin gtt 1000 units/hr Check an 8 hr heparin level Daily heparin level and CBC  Asiel Chrostowski, 09/04/2019 01/27/2022,11:02 AM

## 2022-01-27 NOTE — ED Notes (Signed)
Spoke with Selena Batten from main lab, there is already a blue top in the lab and will run it for a D-dimer test.

## 2022-01-27 NOTE — Consult Note (Addendum)
.   Cardiology Consultation   Patient ID: Adriana [name] MRN: [id number]; DOB: [birth year]  Admit date: 01/26/2022 Date of Consult: 01/27/2022  PCP:  [facility], [facility] Cardiologist:  Buford Dresser, MD        Patient Profile:   Adriana [name] is a 87 y.o. female with a hx of of CVA 11/2018 and 08/2019, HTN, HLD, and hx of pericardial effusion dx on 2012 and normal cors 2012 on cath who is being seen 01/27/2022 for the evaluation of mod. pericardial effusion at the request of Dr. Lorin [name].  History of Present Illness:   Adriana [name] with normal cors 2012 and diagnosis of pericardial effusion in 2012 with sed rate 4, ddimer 0.41, CEA 4.2.  since that time the pericardial effusion diagnosis of pericarditis has been stable.  Last seen in the office 2021.    Presented to ER by EMS from [place] with 10/10 chest pain.  Pain under left breast to left shoulder, left arm  and epigastric.  Also into upper back.  Lasts 3-4 hours.   Increases with inspiration.  She noted SOB.  She is in power wheelchair but can get up to go to BR.  She has had 4 falls this year.    Chest CTA with PE bilateral.  Mod pericardial effusion was seen as well.  Echo this admit with EF 65-70% no RWMA, mild concentric LVH.  RV normal.  Moderate pericardial effusion no tamponade.  Trivial MV regurgitation.    Na 138 K+ 3.3 BUN 13 Cr 0.86  Hs troponin 9 and 11 WBC 11.9 Hgb 13.1 plts 233  DDimer 1.07   2V CXR IMPRESSION: 1.  Question developing right middle lobe airspace opacity. 2. Bibasilar streaky airspace opacities likely representing atelectasis.  EKG:  The EKG was personally reviewed and demonstrates:  SR no acute ST changes  Telemetry:  Telemetry was personally reviewed and demonstrates:  SR  BP 114/73 P 20-22 SR to ST 96 to 100 afebrile  Past Medical History:  Diagnosis Date   Anginal pain ([place]) 08/24/2010   Echo-EF =>55%,LV normal   Arthritis    Cataract  12/2006   Both eyes  Epes MD   Chest pain, unspecified 09/04/2007   Lexiscan-- EF 72%; LV normal   Constipation    Takes flax seed and honey .  Smooth Move tea.   Depression    Diverticulitis    Head injury, closed    with fall   Hypertension    Mitral valve prolapse    Osteopenia    Stroke (Adriana [name]) 09/02/2019    Past Surgical History:  Procedure Laterality Date   ANTERIOR CERVICAL DECOMP/DISCECTOMY FUSION N/A 10/23/2012   Procedure: Cervical Five to Cervical Six, Cervical Six to Cervical Seven anterior cervical decompression with fusion plating and bonegraft;  Surgeon: Adriana Spangle, MD;  Location: [facility];  Service: Neurosurgery;  Laterality: N/A;  ANTERIOR CERVICAL DECOMPRESSION/DISCECTOMY FUSION 2 LEVELS   BREAST CYST ASPIRATION  1990   benign   CARDIAC CATHETERIZATION  08/01/2010   normal coronaries   COLONOSCOPY  approx 2011   EYE SURGERY Bilateral 2011   intestinal blockage surgery  Feb. 2012   "kink in small intestine" per pt     Home Medications:  Prior to Admission medications   Medication Sig Start Date End Date Taking? Authorizing Provider  acetaminophen (TYLENOL) 325 MG tablet Take 325-650 mg by mouth See admin instructions. 2 entries: 283  mg at bedtime + 325 mg every 4 hours as needed for fever >100.63F, pain, headache.   Yes [provider]  alendronate (FOSAMAX) 70 MG tablet TAKE 1 TAB ONCE A WEEK, AT LEAST 30 MIN BEFORE 1ST FOOD.DO NOT LIE DOWN FOR 30 MIN AFTER TAKING. Patient taking differently: Take 70 mg by mouth every Thursday. 02/07/21  Yes [name], [name], NP  amLODipine (NORVASC) 5 MG tablet Take 5 mg by mouth in the morning.   Yes [provider]  atorvastatin (LIPITOR) 20 MG tablet TAKE ONE TABLET BY MOUTH ONCE DAILY. Patient taking differently: Take 20 mg by mouth in the morning. 06/15/21  Yes [name], [name], NP  Calcium Carbonate-Vitamin D (CALCIUM 600 +D HIGH POTENCY) 600-10 MG-MCG TABS Take 1 tablet by mouth in the morning.   Yes  [provider]  citalopram (CELEXA) 20 MG tablet Take 1 tablet (20 mg total) by mouth daily. Patient taking differently: Take 20 mg by mouth at bedtime. 10/23/21  Yes [name], [name], NP  clopidogrel (PLAVIX) 75 MG tablet TAKE 1 TABLET ONCE DAILY. Patient taking differently: Take 75 mg by mouth in the morning. 05/16/21  Yes [name], [name], NP  docusate sodium (COLACE) 100 MG capsule Take 100 mg by mouth at bedtime.   Yes [provider]  metoprolol tartrate (LOPRESSOR) 25 MG tablet Take 12.5 mg by mouth 2 (two) times daily.   Yes [provider]  pantoprazole (PROTONIX) 40 MG tablet TAKE 1 TABLET ONCE DAILY. Patient taking differently: Take 40 mg by mouth in the morning. 05/16/21  Yes [name], [name], NP  potassium chloride (KLOR-CON) 10 MEQ tablet Take 4 tablets (40 mEq total) by mouth daily for 7 days. 01/27/22 02/03/22 Yes Adriana Birchwood, PA-C    Inpatient Medications: Scheduled Meds:  [START ON 01/28/2022] amLODipine  5 mg Oral Daily   [START ON 01/28/2022] atorvastatin  20 mg Oral q AM   citalopram  20 mg Oral QHS   [START ON 01/28/2022] clopidogrel  75 mg Oral q AM   docusate sodium  100 mg Oral BID   heparin  3,600 Units Intravenous Once   metoprolol tartrate  12.5 mg Oral BID   [START ON 01/28/2022] pantoprazole  40 mg Oral q AM   sodium chloride flush  3 mL Intravenous Q12H   Continuous Infusions:  heparin 1,000 Units/hr (01/27/22 1438)   lactated ringers 50 mL/hr at 01/27/22 1436   PRN Meds: acetaminophen **OR** acetaminophen, albuterol, bisacodyl, hydrALAZINE, ondansetron **OR** ondansetron (ZOFRAN) IV, polyethylene glycol  Allergies:    Allergies  Allergen Reactions   Contrast Media [Iodinated Contrast Media] Hives    Hives during IVP at urology office '02, requires 13 hr prep///a.[email or web address]  Patient came thru ER and had a 1 hour pre-medication and did fine   Oxycontin [Oxycodone] Other (See Comments)    Decreased appetite.   Boniva [Ibandronic Acid]  Nausea And Vomiting and Other (See Comments)    Weakness    Codeine Diarrhea and Nausea And Vomiting   Darvocet [Propoxyphene N-Acetaminophen] Diarrhea and Nausea And Vomiting   Erythromycin Nausea And Vomiting    Social History:   Social History   Socioeconomic History   Marital status: Widowed    Spouse name: Joe   Number of children: 2   Years of education: college   Highest education level: Bachelor's degree (e.g., BA, AB, BS)  Occupational History    Comment: retired Pharmacist, hospital  Tobacco Use   Smoking status: Never  Smokeless tobacco: Never  Vaping Use   Vaping Use: Never used  Substance and Sexual Activity   Alcohol use: Not Currently    Alcohol/week: 4.0 standard drinks of alcohol    Types: 2 Glasses of wine, 2 Cans of beer per week    Comment: occasionally   Drug use: No   Sexual activity: Yes  Other Topics Concern   Not on file  Social History Narrative   Diet:  Normal   Do you drink/eat things with caffeine?   Yes   Marital status:  Widow                            What year were you married? 1986 and 1960   Do you live in a house, apartment, assisted living, condo, trailer, etc)?  Apartment at [facility], independent living   Is it one or more stories? 1+   How many persons live in your home?  1   Do you have any pets in your home?  No   Current or past profession:  Pharmacist, hospital, Web designer at [employer] you exercise?   Yes                                                 Type & how often: Stretching,  Strengthening   Do you have a living will?  Yes   Do you have a DNR Form?   Do you have a POA/HPOA forms?  HPOA   Social Determinants of Health   Financial Resource Strain: Low Risk  (09/03/2017)   Overall Financial Resource Strain (CARDIA)    Difficulty of Paying Living Expenses: Not hard at all  Food Insecurity: No Food Insecurity (09/03/2017)   Hunger Vital Sign    Worried About Running Out of Food in the Last Year: Never true    Ran Out of  Food in the Last Year: Never true  Transportation Needs: No Transportation Needs (09/03/2017)   PRAPARE - Hydrologist (Medical): No    Lack of Transportation (Non-Medical): No  Physical Activity: Sufficiently Active (09/03/2017)   Exercise Vital Sign    Days of Exercise per Week: 7 days    Minutes of Exercise per Session: 40 min  Stress: Stress Concern Present (09/03/2017)   [place]    Feeling of Stress : To some extent  Social Connections: Somewhat Isolated (09/03/2017)   Social Connection and Isolation Panel [NHANES]    Frequency of Communication with Friends and Family: More than three times a week    Frequency of Social Gatherings with Friends and Family: More than three times a week    Attends Religious Services: Never    Marine scientist or Organizations: No    Attends Archivist Meetings: Never    Marital Status: Married  Human resources officer Violence: Not At Risk (09/03/2017)   Humiliation, Afraid, Rape, and Kick questionnaire    Fear of Current or Ex-Partner: No    Emotionally Abused: No    Physically Abused: No    Sexually Abused: No    Family History:    Family History  Problem Relation Age of Onset   Hypertension Mother    Transient ischemic attack Mother  Parkinson's disease Mother    Dementia Mother    CVA Mother    Diabetes Father    Coronary artery disease Father    Heart failure Father    Cancer Father        prostate   Depression Father    Congestive Heart Failure Father    Prostate cancer Brother    Diabetes Brother    Prostate cancer Paternal Grandmother      ROS:  Please see the history of present illness.  General:no colds or fevers, no weight changes Skin:no rashes or ulcers HEENT:no blurred vision, no congestion CV:see HPI PUL:see HPI GI:no diarrhea constipation or melena, no indigestion GU:no hematuria, no dysuria MS:no joint pain, no  claudication in power chair most of time Neuro:no syncope, no lightheadedness, hx of 2 strokes Endo:no diabetes, no thyroid disease  All other ROS reviewed and negative.     Physical Exam/Data:   Vitals:   01/27/22 0900 01/27/22 0930 01/27/22 1000 01/27/22 1118  BP: (!) 135/99 (!) 146/80 (!) 146/84   Pulse: 99 100 99   Resp: _0 Temp:    98.3 F (36.8 C)  TempSrc:      SpO2: 93% 94% 93%   Weight:   60.3 kg   Height:   _1  (1.676 m)    No intake or output data in the 24 hours ending 01/27/22 1439    01/27/2022   10:00 AM 11/21/2021    1:54 PM 10/18/2021    1:33 PM  Last 3 Weights  Weight (lbs) 133 lb 133 lb 150 lb 12.8 oz  Weight (kg) 60.328 kg 60.328 kg 68.402 kg     Body mass index is 21.47 kg/m.  Exam per Dr. Sallyanne Kuster   Relevant CV Studies: Echo 02/11/19  1. Left ventricular ejection fraction, by visual estimation, is 60 to  65%. The left ventricle has normal function. There is no left ventricular  hypertrophy.   2. Elevated left ventricular end-diastolic pressure.   3. Left ventricular diastolic parameters are consistent with Grade I  diastolic dysfunction (impaired relaxation).   4. Global right ventricle has normal systolic function.The right  ventricular size is normal. No increase in right ventricular wall  thickness.   5. Left atrial size was normal.   6. Right atrial size was normal.   7. Small to moderate pericardial effusion with no significant change  compared to the prior exam. No evidence of tamponade physiology. Mild  right atrial collapse stable as compared to previous. No septal shudder,  no respiratory related septal shift. No  respiratory variation in mitral inflow. Hepatic veins poorly visualized  for Doppler assessment.   8. The pericardial effusion is primarily anterior to the right ventricle.  Small effusion posterior to the left ventricle.   9. The mitral valve is normal in structure. Trivial mitral valve  regurgitation. No  evidence of mitral stenosis.  10. The tricuspid valve is normal in structure. Tricuspid valve  regurgitation is trivial.  11. The aortic valve is tricuspid. Aortic valve regurgitation is not  visualized. No evidence of aortic valve sclerosis or stenosis.  12. The pulmonic valve was normal in structure. Pulmonic valve  regurgitation is trivial.  13. The left ventricle has no regional wall motion abnormalities.    Echo 12/22/18 1. There is a small to moderate loculated pericardial effusion present over the anterior RV. There is no echocardiographic evidence of tamponade.  2. Left ventricular ejection fraction, by visual estimation, is 60  to 65%. The left ventricle has normal function. Normal left ventricular size. There is borderline left ventricular hypertrophy.  3. Left ventricular diastolic Doppler parameters are consistent with impaired relaxation pattern of LV diastolic filling.  4. Global right ventricle has normal systolic function.The right ventricular size is normal. No increase in right ventricular wall thickness.  5. Left atrial size was normal.  6. Right atrial size was normal.  7. Moderate pericardial effusion.  8. The pericardial effusion is anterior to the right ventricle.  9. Mild thickening of the anterior and posterior mitral valve leaflet(s). 10. The mitral valve is myxomatous. No evidence of mitral valve regurgitation. 11. The tricuspid valve is normal in structure. Tricuspid valve regurgitation is trivial. 12. The aortic valve is tricuspid Aortic valve regurgitation was not visualized by color flow Doppler. Structurally normal aortic valve, with no evidence of sclerosis or stenosis. 13. There is Mild calcification of the aortic valve. 14. There is Mild thickening of the aortic valve. 15. The pulmonic valve was grossly normal. Pulmonic valve regurgitation is not visualized by color flow Doppler. 16. Mild plaque invoving the ascending aorta. 17. Normal pulmonary artery  systolic pressure. 18. The tricuspid regurgitant velocity is 2.02 m/s, and with an assumed right atrial pressure of 3 mmHg, the estimated right ventricular systolic pressure is normal at 19.4 mmHg. 19. The inferior vena cava is normal in size with greater than 50% respiratory variability, suggesting right atrial pressure of 3 mmHg.   Heart cath 08/02/2010 HEMODYNAMIC RESULTS: 1. Aortic systolic pressure 703, diastolic pressure 500. 2. Left ventricular systolic pressure 938, end-diastolic pressure 22. 3. RA pressure A-wave 4, V-wave 2, and mean 1. 4. RV pressure systolic 29, end-diastolic pressure 2. 5. PA pressure systolic 27, diastolic pressure 4, and mean 12. 6. Pulmonary capillary wedge pressure A-wave 4, V-wave 4, and mean 2.   SELECTIVE CORONARY ANGIOGRAPHY: 1. Left main normal. 2. LAD normal. 3. Left circumflex normal. 4. Right coronary artery was dominant and normal.   Ventriculography; RAO left ventriculogram was performed using 25 mL Visipaque dye at 12 mL per second.  The overall LVEF was estimated greater than 60% without focal wall motion abnormalities.   Supravalvular aortography; the supravalvular aortogram was performed using 20 mL of Visipaque dye at 20 mL per second.  Aortic root was normal in caliber.  There was no dissection.  There is no AI.  Arch vessels were intact.   IMPRESSION:  Ms. Bilotta has essentially normal coronary artery, normal LV function, and normal supravalvular aorta and low filling pressures suggesting she is dry.  She certainly is not in tamponade.  The etiology of her chest pain is not cardiac.  She may have pericarditis.  Continued medical therapy will be recommended.    Laboratory Data:  High Sensitivity Troponin:   Recent Labs  Lab 01/26/22 2223 01/27/22 0035  TROPONINIHS 9 11     Chemistry Recent Labs  Lab 01/26/22 2223  NA 138  K 3.3*  CL 104  CO2 25  GLUCOSE 125*  BUN 13  CREATININE 0.86  CALCIUM 9.6  GFRNONAA >60   ANIONGAP 9    No results for input(s): "PROT", "ALBUMIN", "AST", "ALT", "ALKPHOS", "BILITOT" in the last 168 hours. Lipids No results for input(s): "CHOL", "TRIG", "HDL", "LABVLDL", "LDLCALC", "CHOLHDL" in the last 168 hours.  Hematology Recent Labs  Lab 01/26/22 2223  WBC 11.9*  RBC 4.13  HGB 13.1  HCT 38.5  MCV 93.2  MCH 31.7  MCHC 34.0  RDW 12.5  PLT 233   Thyroid No results for input(s): "TSH", "FREET4" in the last 168 hours.  BNPNo results for input(s): "BNP", "PROBNP" in the last 168 hours.  DDimer  Recent Labs  Lab 01/26/22 2223  DDIMER 1.07*     Radiology/Studies:  ECHOCARDIOGRAM COMPLETE  Result Date: 01/27/2022    ECHOCARDIOGRAM REPORT   Patient Name:   Lanika Colgate Sarrazin Date of Exam: 01/27/2022 Medical Rec #:  [id number]   Height:       66.0 in Accession #:    [id number]  Weight:       133.0 lb Date of Birth:  1934-04-26   BSA:          1.681 m Patient Age:    18 years    BP:           146/84 mmHg Patient Gender: F           HR:           92 bpm. Exam Location:  Inpatient Procedure: 2D Echo, Cardiac Doppler and Color Doppler STAT ECHO Indications:    Pericardial effusion  History:        Patient has prior history of Echocardiogram examinations, most                 recent 09/02/2019. CHF; Risk Factors:Hypertension and                 Dyslipidemia. Hx CVA. Pulmonary embolus.  Sonographer:    Clayton Lefort RDCS (AE) Referring Phys: Karmen Bongo IMPRESSIONS  1. Left ventricular ejection fraction, by estimation, is 65 to 70%. The left ventricle has normal function. The left ventricle has no regional wall motion abnormalities. There is mild concentric left ventricular hypertrophy. Left ventricular diastolic parameters are indeterminate.  2. Right ventricular systolic function is normal. The right ventricular size is normal. Tricuspid regurgitation signal is inadequate for assessing PA pressure.  3. Moderate pericardial effusion. The pericardial effusion is circumferential. There is no  evidence of cardiac tamponade.  4. The mitral valve is degenerative. Trivial mitral valve regurgitation.  5. The aortic valve is tricuspid. Aortic valve regurgitation is not visualized.  6. Unable to estimate CVP. Comparison(s): Prior images reviewed side by side. Moderate, circumferential pericardial effusion, slightly larger posteriorly in comparison, but anterior collection similar. No obvious tamponade physiology. FINDINGS  Left Ventricle: Left ventricular ejection fraction, by estimation, is 65 to 70%. The left ventricle has normal function. The left ventricle has no regional wall motion abnormalities. The left ventricular internal cavity size was normal in size. There is  mild concentric left ventricular hypertrophy. Left ventricular diastolic parameters are indeterminate. Right Ventricle: The right ventricular size is normal. No increase in right ventricular wall thickness. Right ventricular systolic function is normal. Tricuspid regurgitation signal is inadequate for assessing PA pressure. Left Atrium: Left atrial size was normal in size. Right Atrium: Right atrial size was normal in size. Pericardium: A moderately sized pericardial effusion is present. The pericardial effusion is circumferential. There is no evidence of cardiac tamponade. Mitral Valve: The mitral valve is degenerative in appearance. There is mild calcification of the mitral valve leaflet(s). Trivial mitral valve regurgitation. Tricuspid Valve: The tricuspid valve is grossly normal. Tricuspid valve regurgitation is trivial. Aortic Valve: The aortic valve is tricuspid. There is mild aortic valve annular calcification. Aortic valve regurgitation is not visualized. Pulmonic Valve: The pulmonic valve was not well visualized. Pulmonic valve regurgitation is not visualized. Aorta: The aortic root is normal in size and structure.  Venous: Unable to estimate CVP. The inferior vena cava was not well visualized. IAS/Shunts: No atrial level shunt  detected by color flow Doppler.  LEFT VENTRICLE PLAX 2D LVIDd:         3.50 cm LVIDs:         2.20 cm LV PW:         1.20 cm LV IVS:        1.30 cm LVOT diam:     1.70 cm LVOT Area:     2.27 cm  RIGHT VENTRICLE RV Basal diam:  3.50 cm RV S prime:     21.40 cm/s TAPSE (M-mode): 1.8 cm LEFT ATRIUM             Index        RIGHT ATRIUM          Index LA diam:        3.60 cm 2.14 cm/m   RA Area:     9.95 cm LA Vol (A2C):   22.2 ml 13.20 ml/m  RA Volume:   21.50 ml 12.79 ml/m LA Vol (A4C):   41.7 ml 24.80 ml/m LA Biplane Vol: 32.3 ml 19.21 ml/m   AORTA Ao Root diam: 2.50 cm Ao Asc diam:  3.30 cm  SHUNTS Systemic Diam: 1.70 cm Rozann Lesches MD Electronically signed by Rozann Lesches MD Signature Date/Time: 01/27/2022/1:29:35 PM    Final    VAS [place] LOWER EXTREMITY VENOUS (DVT)  Result Date: 01/27/2022  Lower Venous DVT Study Patient Name:  EMINE LOPATA Himes  Date of Exam:   01/27/2022 Medical Rec #: [id number]    Accession #:    [id number] Date of Birth: December 20, 1934    Patient Gender: F Patient Age:   53 years Exam Location:  [facility] Procedure:      VAS [place] LOWER EXTREMITY VENOUS (DVT) Referring Phys: Theodis Blaze --------------------------------------------------------------------------------  Indications: Pulmonary embolism. Left leg pain.  Risk Factors: Limited mobility. Comparison Study: No prior studies. Performing Technologist: Darlin Coco RDMS, RVT  Examination Guidelines: A complete evaluation includes B-mode imaging, spectral Doppler, color Doppler, and power Doppler as needed of all accessible portions of each vessel. Bilateral testing is considered an integral part of a complete examination. Limited examinations for reoccurring indications may be performed as noted. The reflux portion of the exam is performed with the patient in reverse Trendelenburg.  +---------+---------------+---------+-----------+----------+--------------+ RIGHT    CompressibilityPhasicitySpontaneityPropertiesThrombus  Aging +---------+---------------+---------+-----------+----------+--------------+ CFV      Full           Yes      Yes                                 +---------+---------------+---------+-----------+----------+--------------+ SFJ      Full                                                        +---------+---------------+---------+-----------+----------+--------------+ FV Prox  Full                                                        +---------+---------------+---------+-----------+----------+--------------+ FV Mid  Full                                                        +---------+---------------+---------+-----------+----------+--------------+ FV DistalFull                                                        +---------+---------------+---------+-----------+----------+--------------+ PFV      Full                                                        +---------+---------------+---------+-----------+----------+--------------+ POP      Full           Yes      Yes                                 +---------+---------------+---------+-----------+----------+--------------+ PTV      Full                                                        +---------+---------------+---------+-----------+----------+--------------+ PERO     Full                                                        +---------+---------------+---------+-----------+----------+--------------+ Gastroc  Full                                                        +---------+---------------+---------+-----------+----------+--------------+   +---------+---------------+---------+-----------+----------+--------------+ LEFT     CompressibilityPhasicitySpontaneityPropertiesThrombus Aging +---------+---------------+---------+-----------+----------+--------------+ CFV      Full           Yes      Yes                                  +---------+---------------+---------+-----------+----------+--------------+ SFJ      Full                                                        +---------+---------------+---------+-----------+----------+--------------+ FV Prox  Full                                                        +---------+---------------+---------+-----------+----------+--------------+  FV Mid   Full                                                        +---------+---------------+---------+-----------+----------+--------------+ FV DistalFull                                                        +---------+---------------+---------+-----------+----------+--------------+ PFV      Full                                                        +---------+---------------+---------+-----------+----------+--------------+ POP      Full           Yes      Yes                                 +---------+---------------+---------+-----------+----------+--------------+ PTV      Full                                                        +---------+---------------+---------+-----------+----------+--------------+ PERO     Full                                                        +---------+---------------+---------+-----------+----------+--------------+ Gastroc  Full                                                        +---------+---------------+---------+-----------+----------+--------------+    Summary: RIGHT: - There is no evidence of deep vein thrombosis in the lower extremity.  - No cystic structure found in the popliteal fossa.  LEFT: - There is no evidence of deep vein thrombosis in the lower extremity.  - No cystic structure found in the popliteal fossa.  *See table(s) above for measurements and observations.    Preliminary    DG Chest 2 View  Result Date: 01/26/2022 CLINICAL DATA:  chest pain, left shoulder pain EXAM: CHEST - 2 VIEW COMPARISON:  Chest x-ray 10/21/2012, CT  chest 08/02/2010 FINDINGS: The heart and mediastinal contours are within normal limits. Bibasilar streaky airspace opacities likely representing atelectasis. Question developing right middle lobe airspace opacity. No pulmonary edema. No pleural effusion. No pneumothorax. No acute osseous abnormality. Please see separately dictated left shoulder radiograph 01/26/2022. IMPRESSION: 1.  Question developing right middle lobe airspace opacity. 2. Bibasilar streaky airspace opacities likely representing atelectasis. Electronically Signed   By: Iven Finn M.D.   On: 01/26/2022 23:20   DG  Shoulder Left  Result Date: 01/26/2022 CLINICAL DATA:  Left shoulder and breast pain, initial encounter EXAM: LEFT SHOULDER - 2+ VIEW COMPARISON:  08/02/2012 FINDINGS: Degenerative changes of the acromioclavicular joint are seen. No acute fracture or dislocation is seen. The underlying bony thorax is within normal limits. IMPRESSION: Degenerative change without acute abnormality. Electronically Signed   By: Inez Catalina M.D.   On: 01/26/2022 23:13     Assessment and Plan:   Pericardial effusion with hx of small pericardial effusion since 2012.  Now slightly larger at moderate.  Hs troponin neg. Pt with bilateral pulmonary embolus as well without RV strain on echo.   2.  Bilateral PE per IM    Risk Assessment/Risk Scores:              For questions or updates, please contact [place] Please consult [email or web address] for contact info under    Signed, Cecilie Kicks, NP  01/27/2022 2:39 PM   I have seen and examined the patient along with Cecilie Kicks, NP.  I have reviewed the chart, notes and new data.  I agree with PA/NP's note.  Key new complaints: The pleuritic chest pain and dyspnea present admission have improved since then.  Pleuritic discomfort is worse in the left axillary and left subscapular area.  Some cough, but no hemoptysis.  Denies leg pain or swelling. Key examination changes:  Jugular venous pulsations are not elevated, she has bilateral pleural rubs, louder on the left, regular rate and rhythm, otherwise normal cardiovascular exam.  No leg edema or erythema. Key new findings / data: No evidence of lower extremity DVT by ultrasound.  Formal report of the CT angiogram of the chest is pending, but on my review she has bilateral segmental pulmonary artery emboli, most obvious in the left lower lobe, a small layering left pleural effusion, small-medium pericardial effusion.  No obvious lung masses or breast masses are seen.  No significant lymphadenopathy.  There is virtually no coronary or aortic atherosclerotic calcification.  Routine labs are only remarkable for a potassium of 3.3.  Borderline WBC 11.9.  D-dimer is elevated as expected.  Hemoglobin is normal at 13.1. Pericardial effusion has been present on echocardiograms going back as far as 2012.  Workup for an etiology has been negative.  At that time ESR showed no evidence of an inflammatory etiology.  ESR performed in August of this year was only 11.  Current chest CT does not appear to show any sign of neighboring malignancy.  She does not have any other findings that suggest a systemic illness such as lupus or rheumatoid arthritis.    PLAN: 1.  Acute pulm embolism: She has been started on intravenous heparin. This appears to be an unprovoked pulmonary embolism (although may be related to her sedentary lifestyle), so would recommend Nakanishi-term anticoagulation.  There is no evidence of acute cor pulmonale clinically or on the echocardiogram. 2.  Pericardial effusion: Chronic, longstanding abnormality. Etiology remains uncertain, but specific treatment appears to be unnecessary for this very slowly growing effusion.  There is no evidence of tamponade.  Pericardiocentesis is not indicated.  It is reasonable to perform a repeat outpatient follow-up echo in 1 year.  Will wait for full interpretation of the CT chest by radiology,  before signing off.   Sanda Klein, MD, [place] [phone] 01/27/2022, 2:57 PM

## 2022-01-27 NOTE — H&P (Signed)
History and Physical    Patient: Adriana [name] ZNB:[id number] DOB: 02-21-1935 DOA: 01/26/2022 DOS: the patient was seen and examined on 01/27/2022 PCP: Frederica Kuster, MD  Patient coming from: ALF/ILF - [facility]; NOK: Daughter, Adriana [name], [phone]   Chief Complaint: chest pain  HPI: Adriana [name] is a 87 y.o. female with medical history significant of HTN, CVA, and depression presenting with chest pain.  She reports that she was having a lot of pain in her left chest pain and shoulder and down her left leg.  She has had 2 strokes and thought it was related to that.  Pain started Wednesday night, 11/22.  It would come and go.  She was SOB with exertion.  No prior h/o clots.  No recent surgery, fracture. She has a power wheelchair (about 1 year) but is able to get up to bathroom with a walker.  She has had 4 falls in the last year, last 3-4 weeks ago, down about 15 minutes.  No recent travel.  Mobility is slowing.  She is not currently feeling pain, other than her chronic back pain.  The facility did place her on [place] O2 for a period of time last night, but she has not needed it today.    ER Course:  PE.  3 days of L-sided CP with radiation to shoulder and back, SOB worse with exertion.  Cardiac eval negative.  D-dimer +, CTA + for B PEs, L pleural effusion.  Also with pericardial effusion, 1 cm.  93% on RA.  Starting Heparin drip.     Review of Systems: As mentioned in the history of present illness. All other systems reviewed and are negative. Past Medical History:  Diagnosis Date   Anginal pain (HCC) 08/24/2010   Echo-EF =>55%,LV normal   Arthritis    Cataract 12/2006   Both eyes  Epes MD   Chest pain, unspecified 09/04/2007   Lexiscan-- EF 72%; LV normal   Constipation    Takes flax seed and honey .  Smooth Move tea.   Depression    Diverticulitis    Head injury, closed    with fall   Hypertension    Mitral valve prolapse    Osteopenia    Stroke (HCC)  09/02/2019   Past Surgical History:  Procedure Laterality Date   ANTERIOR CERVICAL DECOMP/DISCECTOMY FUSION N/A 10/23/2012   Procedure: Cervical Five to Cervical Six, Cervical Six to Cervical Seven anterior cervical decompression with fusion plating and bonegraft;  Surgeon: Hewitt Shorts, MD;  Location: [facility];  Service: Neurosurgery;  Laterality: N/A;  ANTERIOR CERVICAL DECOMPRESSION/DISCECTOMY FUSION 2 LEVELS   BREAST CYST ASPIRATION  1990   benign   CARDIAC CATHETERIZATION  08/01/2010   normal coronaries   COLONOSCOPY  approx 2011   EYE SURGERY Bilateral 2011   intestinal blockage surgery  Feb. 2012   "kink in small intestine" per pt   Social History:  reports that she has never smoked. She has never used smokeless tobacco. She reports that she does not currently use alcohol after a past usage of about 4.0 standard drinks of alcohol per week. She reports that she does not use drugs.  Allergies  Allergen Reactions   Contrast Media [Iodinated Contrast Media] Hives    Hives during IVP at urology office '02, requires 13 hr prep///[email or web address]  Patient came thru ER and had a 1 hour pre-medication and did fine   Oxycontin [Oxycodone] Other (See Comments)    Decreased  appetite.   Boniva [Ibandronic Acid] Nausea And Vomiting and Other (See Comments)    Weakness    Codeine Diarrhea and Nausea And Vomiting   Darvocet [Propoxyphene N-Acetaminophen] Diarrhea and Nausea And Vomiting   Erythromycin Nausea And Vomiting    Family History  Problem Relation Age of Onset   Hypertension Mother    Transient ischemic attack Mother    Parkinson's disease Mother    Dementia Mother    CVA Mother    Diabetes Father    Coronary artery disease Father    Heart failure Father    Cancer Father        prostate   Depression Father    Congestive Heart Failure Father    Prostate cancer Brother    Diabetes Brother    Prostate cancer Paternal Grandmother     Prior to Admission medications    Medication Sig Start Date End Date Taking? Authorizing Provider  acetaminophen (TYLENOL) 325 MG tablet Take 325-650 mg by mouth See admin instructions. 2 entries: 650 mg at bedtime + 325 mg every 4 hours as needed for fever >100.23F, pain, headache.   Yes [provider]  alendronate (FOSAMAX) 70 MG tablet TAKE 1 TAB ONCE A WEEK, AT LEAST 30 MIN BEFORE 1ST FOOD.DO NOT LIE DOWN FOR 30 MIN AFTER TAKING. Patient taking differently: Take 70 mg by mouth every Thursday. 02/07/21  Yes [name], [name], NP  amLODipine (NORVASC) 5 MG tablet Take 5 mg by mouth daily.   Yes [provider]  atorvastatin (LIPITOR) 20 MG tablet TAKE ONE TABLET BY MOUTH ONCE DAILY. Patient taking differently: Take 20 mg by mouth daily. 06/15/21  Yes [name], [name], NP  Calcium Carbonate-Vitamin D (CALCIUM 600 +D HIGH POTENCY) 600-10 MG-MCG TABS Take 1 tablet by mouth daily.   Yes [provider]  citalopram (CELEXA) 20 MG tablet Take 1 tablet (20 mg total) by mouth daily. 10/23/21  Yes [name], [name], NP  clopidogrel (PLAVIX) 75 MG tablet TAKE 1 TABLET ONCE DAILY. Patient taking differently: Take 75 mg by mouth daily. 05/16/21  Yes [name], [name], NP  docusate sodium (COLACE) 100 MG capsule Take 100 mg by mouth at bedtime.   Yes [provider]  metoprolol tartrate (LOPRESSOR) 25 MG tablet Take 12.5 mg by mouth 2 (two) times daily.   Yes [provider]  pantoprazole (PROTONIX) 40 MG tablet TAKE 1 TABLET ONCE DAILY. Patient taking differently: Take 40 mg by mouth daily. 05/16/21  Yes [name], [name], NP  potassium chloride (KLOR-CON) 10 MEQ tablet Take 4 tablets (40 mEq total) by mouth daily for 7 days. 01/27/22 02/03/22 Yes Claudie LeachLoeffler, Grace C, PA-C    Physical Exam: Vitals:   01/27/22 0900 01/27/22 0930 01/27/22 1000 01/27/22 1118  BP: (!) 135/99 (!) 146/80 (!) 146/84   Pulse: 99 100 99   Resp: 20 18 12    Temp:    98.3 F (36.8 C)  TempSrc:      SpO2: 93% 94% 93%   Weight:   60.3 kg   Height:    5\' 6"  (1.676 m)    General:  Appears calm and comfortable and is in NAD, on RA Eyes:   EOMI, normal lids, iris ENT:  grossly normal hearing, lips & tongue, mmm; appropriate dentition Neck:  no LAD, masses or thyromegaly Cardiovascular:  RR with mild tachycardia, no m/r/g. No LE edema.  Respiratory:   CTA bilaterally with no wheezes/rales/rhonchi.  Normal respiratory effort. Abdomen:  soft, NT,  ND Skin:  no rash or induration seen on limited exam Musculoskeletal:  grossly normal tone BUE/BLE, good ROM, no bony abnormality Psychiatric:  grossly normal mood and affect, speech fluent and appropriate with occasional word finding difficulties, AOx3 Neurologic:  CN 2-12 grossly intact, moves all extremities in coordinated fashion   Radiological Exams on Admission: Independently reviewed - see discussion in A/P where applicable  VAS [place] LOWER EXTREMITY VENOUS (DVT)  Result Date: 01/27/2022  Lower Venous DVT Study Patient Name:  TRINDA HARLACHER Michna  Date of Exam:   01/27/2022 Medical Rec #: [id number]    Accession #:    [id number] Date of Birth: October 28, 1934    Patient Gender: F Patient Age:   63 years Exam Location:  [facility] Procedure:      VAS [place] LOWER EXTREMITY VENOUS (DVT) Referring Phys: Mertha Baars --------------------------------------------------------------------------------  Indications: Pulmonary embolism. Left leg pain.  Risk Factors: Limited mobility. Comparison Study: No prior studies. Performing Technologist: Jean Rosenthal RDMS, RVT  Examination Guidelines: A complete evaluation includes B-mode imaging, spectral Doppler, color Doppler, and power Doppler as needed of all accessible portions of each vessel. Bilateral testing is considered an integral part of a complete examination. Limited examinations for reoccurring indications may be performed as noted. The reflux portion of the exam is performed with the patient in reverse Trendelenburg.   +---------+---------------+---------+-----------+----------+--------------+ RIGHT    CompressibilityPhasicitySpontaneityPropertiesThrombus Aging +---------+---------------+---------+-----------+----------+--------------+ CFV      Full           Yes      Yes                                 +---------+---------------+---------+-----------+----------+--------------+ SFJ      Full                                                        +---------+---------------+---------+-----------+----------+--------------+ FV Prox  Full                                                        +---------+---------------+---------+-----------+----------+--------------+ FV Mid   Full                                                        +---------+---------------+---------+-----------+----------+--------------+ FV DistalFull                                                        +---------+---------------+---------+-----------+----------+--------------+ PFV      Full                                                        +---------+---------------+---------+-----------+----------+--------------+  POP      Full           Yes      Yes                                 +---------+---------------+---------+-----------+----------+--------------+ PTV      Full                                                        +---------+---------------+---------+-----------+----------+--------------+ PERO     Full                                                        +---------+---------------+---------+-----------+----------+--------------+ Gastroc  Full                                                        +---------+---------------+---------+-----------+----------+--------------+   +---------+---------------+---------+-----------+----------+--------------+ LEFT     CompressibilityPhasicitySpontaneityPropertiesThrombus Aging  +---------+---------------+---------+-----------+----------+--------------+ CFV      Full           Yes      Yes                                 +---------+---------------+---------+-----------+----------+--------------+ SFJ      Full                                                        +---------+---------------+---------+-----------+----------+--------------+ FV Prox  Full                                                        +---------+---------------+---------+-----------+----------+--------------+ FV Mid   Full                                                        +---------+---------------+---------+-----------+----------+--------------+ FV DistalFull                                                        +---------+---------------+---------+-----------+----------+--------------+ PFV      Full                                                        +---------+---------------+---------+-----------+----------+--------------+  POP      Full           Yes      Yes                                 +---------+---------------+---------+-----------+----------+--------------+ PTV      Full                                                        +---------+---------------+---------+-----------+----------+--------------+ PERO     Full                                                        +---------+---------------+---------+-----------+----------+--------------+ Gastroc  Full                                                        +---------+---------------+---------+-----------+----------+--------------+    Summary: RIGHT: - There is no evidence of deep vein thrombosis in the lower extremity.  - No cystic structure found in the popliteal fossa.  LEFT: - There is no evidence of deep vein thrombosis in the lower extremity.  - No cystic structure found in the popliteal fossa.  *See table(s) above for measurements and observations.    Preliminary    DG  Chest 2 View  Result Date: 01/26/2022 CLINICAL DATA:  chest pain, left shoulder pain EXAM: CHEST - 2 VIEW COMPARISON:  Chest x-ray 10/21/2012, CT chest 08/02/2010 FINDINGS: The heart and mediastinal contours are within normal limits. Bibasilar streaky airspace opacities likely representing atelectasis. Question developing right middle lobe airspace opacity. No pulmonary edema. No pleural effusion. No pneumothorax. No acute osseous abnormality. Please see separately dictated left shoulder radiograph 01/26/2022. IMPRESSION: 1.  Question developing right middle lobe airspace opacity. 2. Bibasilar streaky airspace opacities likely representing atelectasis. Electronically Signed   By: Tish Frederickson M.D.   On: 01/26/2022 23:20   DG Shoulder Left  Result Date: 01/26/2022 CLINICAL DATA:  Left shoulder and breast pain, initial encounter EXAM: LEFT SHOULDER - 2+ VIEW COMPARISON:  08/02/2012 FINDINGS: Degenerative changes of the acromioclavicular joint are seen. No acute fracture or dislocation is seen. The underlying bony thorax is within normal limits. IMPRESSION: Degenerative change without acute abnormality. Electronically Signed   By: Alcide Clever M.D.   On: 01/26/2022 23:13     CTA:  IMPRESSION: 1. Bilateral pulmonary emboli. 2. No aortic aneurysm or dissection. 3. Moderate left-sided pleural effusion. 4. Dependent bibasilar subsegmental atelectasis or pneumonitis. 5. Pericardial effusion. 6. Bilateral renal and hepatic cysts.    EKG: Independently reviewed.  NSR with rate 88; nonspecific ST changes with no evidence of acute ischemia   Labs on Admission: I have personally reviewed the available labs and imaging studies at the time of the admission.  Pertinent labs:    K+ 3.3 Glucose 125 HS troponin 9, 11 WBC 11.9 D-dimer 1.07   Assessment and Plan: Principal Problem:   Pulmonary embolus (HCC) Active Problems:  Depression with anxiety   Hypertension   History of CVA  (cerebrovascular accident)   Pericardial effusion   DNR (do not resuscitate)    PE -Patient without prior episodes of thromboembolic disease presenting with new PE -Patient is not showing evidence of hemodynamic instability at this time -Given her hemodynamic stability, she is at intermediate risk -PESI score is Class II, low risk, indicating a 1.7-3.5% 30-day mortality risk -S-PESI score is high based on age alone, indicating that the patient has an 8.9% risk of death -Will admit on telemetry  -R heart strain was not seen on CT, but she had a pericardial effusion -Troponin as well as echo have been ordered to assess the severity of clot burden -With high S-PESI but without elevated biomarker, IR should be consulted only if the patient is worsening or not improving. -Initiate anticoagulation - for now, will start treatment-dose heparin with plan to transition to alternative [facility] agent tomorrow  -[place] O2 as needed - currently none -Will request TOC team consultation to assist with cost analysis of the various DOAC options based on her insurance -Patients are at intermediate risk (3-8%/year) for recurrent VTE if initial clot occurred with no identifiable RF.  Extended oral anticoagulation of indefinite duration should be considered for patients with a first episode of PE with these issues. -DVT US ordered; while the legs are normal in appearance and non-painful, if the patient does have an underlying DVT then an IVC filter might need to be considered - particularly if the patient is unable to tolerate OAC therapy   Pericardial effusion -Incidentally noted on CT -It would be rare for a pericardial effusion to be caused by a PE -Also present on prior echos in 2020 and 2021 -STAT echo ordered -Cardiology consulted  HTN -Continue amlodipine, metoprolol  H/o CVA -Continue Plavix, atorvastatin  Depression -Continue citalopram  DNR -I have discussed code status with the patient and her  daughter and  they are in agreement that the patient would not desire resuscitation and would prefer to die a natural death should that situation arise. -She will need a gold out of facility DNR form at the time of discharge     Advance Care Planning:   Code Status: DNR   Consults: Cardiology; Nutrition; PT/OT; TOC team  DVT Prophylaxis: Heparin drip  Family Communication: Daughter was present throughout evaluation  Severity of Illness: The appropriate patient status for this patient is INPATIENT. Inpatient status is judged to be reasonable and necessary in order to provide the required intensity of service to ensure the patient's safety. The patient's presenting symptoms, physical exam findings, and initial radiographic and laboratory data in the context of their chronic comorbidities is felt to place them at high risk for further clinical deterioration. Furthermore, it is not anticipated that the patient will be medically stable for discharge from the hospital within 2 midnights of admission.   * I certify that at the point of admission it is my clinical judgment that the patient will require inpatient hospital care spanning beyond 2 midnights from the point of admission due to high intensity of service, high risk for further deterioration and high frequency of surveillance required.*  Author: Jonah Blue, MD 01/27/2022 1:27 PM  For on call review [email or web address].

## 2022-01-28 DIAGNOSIS — I2699 Other pulmonary embolism without acute cor pulmonale: Principal | ICD-10-CM

## 2022-01-28 LAB — CBC
HCT: 38 % (ref 36.0–46.0)
Hemoglobin: 12.9 g/dL (ref 12.0–15.0)
MCH: 31.7 pg (ref 26.0–34.0)
MCHC: 33.9 g/dL (ref 30.0–36.0)
MCV: 93.4 fL (ref 80.0–100.0)
Platelets: 225 10*3/uL (ref 150–400)
RBC: 4.07 MIL/uL (ref 3.87–5.11)
RDW: 12.5 % (ref 11.5–15.5)
WBC: 11.4 10*3/uL — ABNORMAL HIGH (ref 4.0–10.5)
nRBC: 0 % (ref 0.0–0.2)

## 2022-01-28 LAB — HEPARIN LEVEL (UNFRACTIONATED): Heparin Unfractionated: 0.58 IU/mL (ref 0.30–0.70)

## 2022-01-28 LAB — BASIC METABOLIC PANEL
Anion gap: 8 (ref 5–15)
BUN: 14 mg/dL (ref 8–23)
CO2: 24 mmol/L (ref 22–32)
Calcium: 9 mg/dL (ref 8.9–10.3)
Chloride: 108 mmol/L (ref 98–111)
Creatinine, Ser: 0.81 mg/dL (ref 0.44–1.00)
GFR, Estimated: >60 mL/min (ref >60)
Glucose, Bld: 108 mg/dL — ABNORMAL HIGH (ref 70–99)
Potassium: 3.7 mmol/L (ref 3.5–5.1)
Sodium: 140 mmol/L (ref 135–145)

## 2022-01-28 MED ORDER — APIXABAN 5 MG PO TABS
10.0000 mg | ORAL_TABLET | Freq: Two times a day (BID) | ORAL | Status: DC
  Administered 2022-01-28: 10 mg via ORAL
  Filled 2022-01-28: qty 2

## 2022-01-28 MED ORDER — APIXABAN 5 MG PO TABS: ORAL_TABLET | ORAL | 0 refills | Status: DC

## 2022-01-28 MED ORDER — APIXABAN 5 MG PO TABS: 5.0000 mg | ORAL_TABLET | Freq: Two times a day (BID) | ORAL | Status: DC

## 2022-01-28 NOTE — ED Notes (Signed)
Patient awake and alert this morning, completing some ADL's, ambulated to the restroom with a walker and staff, will continue to monitor.

## 2022-01-28 NOTE — ED Notes (Signed)
Pt received breakfast tray 

## 2022-01-28 NOTE — Care Management CC44 (Signed)
Condition Code 44 Documentation Completed  Patient Details  Name: Adriana [name] MRN: [id number] Date of Birth: [birth year]   Condition Code 44 given:  Yes Patient signature on Condition Code 44 notice:  Yes Documentation of 2 MD's agreement:  Yes Code 44 added to claim:  Yes    Georgie Chard, LCSW 01/28/2022, 10:28 AM

## 2022-01-28 NOTE — Discharge Summary (Signed)
Physician Discharge Summary   Patient: Adriana [name] MRN: [id number] DOB: [birth year]  Admit date:     01/26/2022  Discharge date: 01/28/22  Discharge Physician: Tyrone Nine   PCP: Frederica Kuster, MD   Recommendations at discharge:  Monitor BMP, CBC and respiratory status. Started on eliquis (full dose) for acute PE with negative LE venous U/S, normal RV size and function by echo. Dose reduction may be required if the patient loses weight (60.3kg at this time) or creatinine rises > 1.5 (currently 0.81). Follow up with cardiology for persistent moderate pericardial effusion (first noted in 2012, no tamponade, see details below).   Discharge Diagnoses: Principal Problem:   Pulmonary embolus (HCC) Active Problems:   Depression with anxiety   Hypertension   History of CVA (cerebrovascular accident)   Pericardial effusion   DNR (do not resuscitate)   Acute pulmonary embolism [facility])  Hospital Course: HPI: Adriana [name] is a 87 y.o. female with medical history significant of HTN, CVA, and depression presenting with chest pain.  She reports that she was having a lot of pain in her left chest pain and shoulder and down her left leg.  She has had 2 strokes and thought it was related to that.  Pain started Wednesday night, 11/22.  It would come and go.  She was SOB with exertion.  No prior h/o clots.  No recent surgery, fracture. She has a power wheelchair (about 1 year) but is able to get up to bathroom with a walker.  She has had 4 falls in the last year, last 3-4 weeks ago, down about 15 minutes.  No recent travel.  Mobility is slowing.  She is not currently feeling pain, other than her chronic back pain.  The facility did place her on [place] O2 for a period of time last night, but she has not needed it today.    ER Course:  PE.  3 days of L-sided CP with radiation to shoulder and back, SOB worse with exertion.  Cardiac eval negative.  D-dimer +, CTA + for B PEs, L pleural effusion.  Also with  pericardial effusion, 1 cm.  93% on RA.  Started Heparin drip.  She remained stable without hypoxia and no further work up was recommended by cardiology.    Assessment and Plan: Acute pulmonary embolism: Hemodynamically stable, no hypoxia.  **Please note the official radiologist interpretation is unavailable electronically at the time of discharge. I spoke with [facility] and had the entire report dictated to me with no additional findings not already known and discussed here. Dr. Ivin Booty Pleasure was the reading radiologist and will re-file the dictation of his interpretation later today. - Initiated eliquis with plans for at least 3-6 months anticoagulation, likely indefinite given unprovoked episode.  - Pursue routine cancer screenings on which the patient reports she is UTD without positive findings.    Chronic pericardial effusion: normal cors 2012 and diagnosis of pericardial effusion in 2012 with sed rate 4, ddimer 0.41, CEA 4.2.  since that time the pericardial effusion diagnosis of pericarditis has been stable.  - No acute work up recommended by cardiology. No tamponade physiology.   HTN -Continue amlodipine, metoprolol   H/o CVA -Continue Plavix, atorvastatin   Depression -Continue citalopram   DNR: POA   Consultants: Cardiology Procedures performed: Echo  Disposition: [facility] with HHPT, HHOT Diet recommendation:  Cardiac diet DISCHARGE MEDICATION: Allergies as of 01/28/2022       Reactions   Contrast  Media [iodinated Contrast Media] Hives   Hives during IVP at urology office '02, requires 13 hr prep///a.[email or web address] Patient came thru ER and had a 1 hour pre-medication and did fine   Oxycontin [oxycodone] Other (See Comments)   Decreased appetite.   Boniva [ibandronic Acid] Nausea And Vomiting, Other (See Comments)   Weakness    Codeine Diarrhea, Nausea And Vomiting   Darvocet [propoxyphene N-acetaminophen] Diarrhea, Nausea And Vomiting    Erythromycin Nausea And Vomiting        Medication List     TAKE these medications    acetaminophen 325 MG tablet Commonly known as: TYLENOL Take 325-650 mg by mouth See admin instructions. 2 entries: 650 mg at bedtime + 325 mg every 4 hours as needed for fever >100.23F, pain, headache.   alendronate 70 MG tablet Commonly known as: FOSAMAX TAKE 1 TAB ONCE A WEEK, AT LEAST 30 MIN BEFORE 1ST FOOD.DO NOT LIE DOWN FOR 30 MIN AFTER TAKING. What changed: See the new instructions.   amLODipine 5 MG tablet Commonly known as: NORVASC Take 5 mg by mouth in the morning.   apixaban 5 MG Tabs tablet Commonly known as: ELIQUIS Take 2 tablets (10 mg total) by mouth 2 (two) times daily for 7 days, THEN 1 tablet (5 mg total) 2 (two) times daily for 23 days. Start taking on: January 28, 2022   atorvastatin 20 MG tablet Commonly known as: LIPITOR TAKE ONE TABLET BY MOUTH ONCE DAILY. What changed: when to take this   Calcium 600 +D High Potency 600-10 MG-MCG Tabs Generic drug: Calcium Carbonate-Vitamin D Take 1 tablet by mouth in the morning.   citalopram 20 MG tablet Commonly known as: CELEXA Take 1 tablet (20 mg total) by mouth daily. What changed: when to take this   clopidogrel 75 MG tablet Commonly known as: PLAVIX TAKE 1 TABLET ONCE DAILY. What changed: when to take this   docusate sodium 100 MG capsule Commonly known as: COLACE Take 100 mg by mouth at bedtime.   metoprolol tartrate 25 MG tablet Commonly known as: LOPRESSOR Take 12.5 mg by mouth 2 (two) times daily.   pantoprazole 40 MG tablet Commonly known as: PROTONIX TAKE 1 TABLET ONCE DAILY. What changed: when to take this        Follow-up Information     Frederica Kuster, MD Follow up.   Specialties: Family Medicine, Emergency Medicine Contact information: [address] [place] [address] [phone]         [name], MD Follow up.   Specialty: Cardiology Contact  information: [address] [place] [address] [phone]                Discharge Exam: Ceasar Mons Weights   01/27/22 1000  Weight: 60.3 kg  BP 128/68 (BP Location: Right Arm)   Pulse 63   Temp 98.8 F (37.1 C) (Oral)   Resp 19   Ht  (1.676 m)   Wt 60.3 kg   SpO2 96%   BMI 21.47 kg/m   Elderly pleasant, well-appearing female in no distress. She reports a desire to return to [facility], resolution of her left sided chest pain except with very deep breaths, none with exertion when she got up to BR with walker overnight a few times. Had good BM this morning which she reports makes her feel good. Distant, S1 S2, no MRG, JVD or pitting edema Clear, nonlabored, good air movement Soft, NT, ND, +BS  Condition at discharge: stable  The results of significant diagnostics from this hospitalization (including imaging, microbiology, ancillary and laboratory) are listed below for reference.   Imaging Studies: VAS [place] LOWER EXTREMITY VENOUS (DVT)  Result Date: 01/27/2022  Lower Venous DVT Study Patient Name:  Adriana [name]  Date of Exam:   01/27/2022 Medical Rec #: [id number]    Accession #:    [id number] Date of Birth: 1934-08-18    Patient Gender: F Patient Age:   8 years Exam Location:  [facility] Procedure:      VAS [place] LOWER EXTREMITY VENOUS (DVT) Referring Phys: Mertha Baars --------------------------------------------------------------------------------  Indications: Pulmonary embolism. Left leg pain.  Risk Factors: Limited mobility. Comparison Study: No prior studies. Performing Technologist: Jean Rosenthal RDMS, RVT  Examination Guidelines: A complete evaluation includes B-mode imaging, spectral Doppler, color Doppler, and power Doppler as needed of all accessible portions of each vessel. Bilateral testing is considered an integral part of a complete examination. Limited examinations for reoccurring indications may be performed as noted. The reflux portion of the exam  is performed with the patient in reverse Trendelenburg.  +---------+---------------+---------+-----------+----------+--------------+ RIGHT    CompressibilityPhasicitySpontaneityPropertiesThrombus Aging +---------+---------------+---------+-----------+----------+--------------+ CFV      Full           Yes      Yes                                 +---------+---------------+---------+-----------+----------+--------------+ SFJ      Full                                                        +---------+---------------+---------+-----------+----------+--------------+ FV Prox  Full                                                        +---------+---------------+---------+-----------+----------+--------------+ FV Mid   Full                                                        +---------+---------------+---------+-----------+----------+--------------+ FV DistalFull                                                        +---------+---------------+---------+-----------+----------+--------------+ PFV      Full                                                        +---------+---------------+---------+-----------+----------+--------------+ POP      Full           Yes      Yes                                 +---------+---------------+---------+-----------+----------+--------------+  PTV      Full                                                        +---------+---------------+---------+-----------+----------+--------------+ PERO     Full                                                        +---------+---------------+---------+-----------+----------+--------------+ Gastroc  Full                                                        +---------+---------------+---------+-----------+----------+--------------+   +---------+---------------+---------+-----------+----------+--------------+ LEFT      CompressibilityPhasicitySpontaneityPropertiesThrombus Aging +---------+---------------+---------+-----------+----------+--------------+ CFV      Full           Yes      Yes                                 +---------+---------------+---------+-----------+----------+--------------+ SFJ      Full                                                        +---------+---------------+---------+-----------+----------+--------------+ FV Prox  Full                                                        +---------+---------------+---------+-----------+----------+--------------+ FV Mid   Full                                                        +---------+---------------+---------+-----------+----------+--------------+ FV DistalFull                                                        +---------+---------------+---------+-----------+----------+--------------+ PFV      Full                                                        +---------+---------------+---------+-----------+----------+--------------+ POP      Full           Yes      Yes                                 +---------+---------------+---------+-----------+----------+--------------+  PTV      Full                                                        +---------+---------------+---------+-----------+----------+--------------+ PERO     Full                                                        +---------+---------------+---------+-----------+----------+--------------+ Gastroc  Full                                                        +---------+---------------+---------+-----------+----------+--------------+     Summary: RIGHT: - There is no evidence of deep vein thrombosis in the lower extremity.  - No cystic structure found in the popliteal fossa.  LEFT: - There is no evidence of deep vein thrombosis in the lower extremity.  - No cystic structure found in the popliteal fossa.  *See  table(s) above for measurements and observations. Electronically signed by Coral Else MD on 01/27/2022 at 8:50:53 PM.    Final    ECHOCARDIOGRAM COMPLETE  Result Date: 01/27/2022    ECHOCARDIOGRAM REPORT   Patient Name:   Adriana [name] Date of Exam: 01/27/2022 Medical Rec #:  [id number]   Height:       66.0 in Accession #:    [id number]  Weight:       133.0 lb Date of Birth:  [birth year]   BSA:          1.681 m Patient Age:    87 years    BP:           146/84 mmHg Patient Gender: F           HR:           92 bpm. Exam Location:  Inpatient Procedure: 2D Echo, Cardiac Doppler and Color Doppler STAT ECHO Indications:    Pericardial effusion  History:        Patient has prior history of Echocardiogram examinations, most                 recent 09/02/2019. CHF; Risk Factors:Hypertension and                 Dyslipidemia. Hx CVA. Pulmonary embolus.  Sonographer:    Ross Ludwig RDCS (AE) Referring Phys: Jonah Blue IMPRESSIONS  1. Left ventricular ejection fraction, by estimation, is 65 to 70%. The left ventricle has normal function. The left ventricle has no regional wall motion abnormalities. There is mild concentric left ventricular hypertrophy. Left ventricular diastolic parameters are indeterminate.  2. Right ventricular systolic function is normal. The right ventricular size is normal. Tricuspid regurgitation signal is inadequate for assessing PA pressure.  3. Moderate pericardial effusion. The pericardial effusion is circumferential. There is no evidence of cardiac tamponade.  4. The mitral valve is degenerative. Trivial mitral valve regurgitation.  5. The aortic valve is tricuspid. Aortic valve regurgitation is not visualized.  6. Unable to estimate CVP. Comparison(s): Prior images reviewed side by side.  Moderate, circumferential pericardial effusion, slightly larger posteriorly in comparison, but anterior collection similar. No obvious tamponade physiology. FINDINGS  Left Ventricle: Left ventricular ejection  fraction, by estimation, is 65 to 70%. The left ventricle has normal function. The left ventricle has no regional wall motion abnormalities. The left ventricular internal cavity size was normal in size. There is  mild concentric left ventricular hypertrophy. Left ventricular diastolic parameters are indeterminate. Right Ventricle: The right ventricular size is normal. No increase in right ventricular wall thickness. Right ventricular systolic function is normal. Tricuspid regurgitation signal is inadequate for assessing PA pressure. Left Atrium: Left atrial size was normal in size. Right Atrium: Right atrial size was normal in size. Pericardium: A moderately sized pericardial effusion is present. The pericardial effusion is circumferential. There is no evidence of cardiac tamponade. Mitral Valve: The mitral valve is degenerative in appearance. There is mild calcification of the mitral valve leaflet(s). Trivial mitral valve regurgitation. Tricuspid Valve: The tricuspid valve is grossly normal. Tricuspid valve regurgitation is trivial. Aortic Valve: The aortic valve is tricuspid. There is mild aortic valve annular calcification. Aortic valve regurgitation is not visualized. Pulmonic Valve: The pulmonic valve was not well visualized. Pulmonic valve regurgitation is not visualized. Aorta: The aortic root is normal in size and structure. Venous: Unable to estimate CVP. The inferior vena cava was not well visualized. IAS/Shunts: No atrial level shunt detected by color flow Doppler.  LEFT VENTRICLE PLAX 2D LVIDd:         3.50 cm LVIDs:         2.20 cm LV PW:         1.20 cm LV IVS:        1.30 cm LVOT diam:     1.70 cm LVOT Area:     2.27 cm  RIGHT VENTRICLE RV Basal diam:  3.50 cm RV S prime:     21.40 cm/s TAPSE (M-mode): 1.8 cm LEFT ATRIUM             Index        RIGHT ATRIUM          Index LA diam:        3.60 cm 2.14 cm/m   RA Area:     9.95 cm LA Vol (A2C):   22.2 ml 13.20 ml/m  RA Volume:   21.50 ml 12.79 ml/m  LA Vol (A4C):   41.7 ml 24.80 ml/m LA Biplane Vol: 32.3 ml 19.21 ml/m   AORTA Ao Root diam: 2.50 cm Ao Asc diam:  3.30 cm  SHUNTS Systemic Diam: 1.70 cm Nona Dell MD Electronically signed by Nona Dell MD Signature Date/Time: 01/27/2022/1:29:35 PM    Final    DG Chest 2 View  Result Date: 01/26/2022 CLINICAL DATA:  chest pain, left shoulder pain EXAM: CHEST - 2 VIEW COMPARISON:  Chest x-ray 10/21/2012, CT chest 08/02/2010 FINDINGS: The heart and mediastinal contours are within normal limits. Bibasilar streaky airspace opacities likely representing atelectasis. Question developing right middle lobe airspace opacity. No pulmonary edema. No pleural effusion. No pneumothorax. No acute osseous abnormality. Please see separately dictated left shoulder radiograph 01/26/2022. IMPRESSION: 1.  Question developing right middle lobe airspace opacity. 2. Bibasilar streaky airspace opacities likely representing atelectasis. Electronically Signed   By: Tish Frederickson M.D.   On: 01/26/2022 23:20   DG Shoulder Left  Result Date: 01/26/2022 CLINICAL DATA:  Left shoulder and breast pain, initial encounter EXAM: LEFT SHOULDER - 2+ VIEW COMPARISON:  08/02/2012 FINDINGS: Degenerative changes of the  acromioclavicular joint are seen. No acute fracture or dislocation is seen. The underlying bony thorax is within normal limits. IMPRESSION: Degenerative change without acute abnormality. Electronically Signed   By: Alcide CleverMark  Lukens M.D.   On: 01/26/2022 23:13    Microbiology: Results for orders placed or performed during the hospital encounter of 09/02/19  SARS Coronavirus 2 by RT PCR (hospital order, performed in [facility] hospital lab) Nasopharyngeal Nasopharyngeal Swab     Status: None   Collection Time: 09/02/19  2:05 PM   Specimen: Nasopharyngeal Swab  Result Value Ref Range Status   SARS Coronavirus 2 NEGATIVE NEGATIVE Final    Comment: (NOTE) SARS-CoV-2 target nucleic acids are NOT DETECTED.  The  SARS-CoV-2 RNA is generally detectable in upper and lower respiratory specimens during the acute phase of infection. The lowest concentration of SARS-CoV-2 viral copies this assay can detect is 250 copies / mL. A negative result does not preclude SARS-CoV-2 infection and should not be used as the sole basis for treatment or other patient management decisions.  A negative result may occur with improper specimen collection / handling, submission of specimen other than nasopharyngeal swab, presence of viral mutation(s) within the areas targeted by this assay, and inadequate number of viral copies (<250 copies / mL). A negative result must be combined with clinical observations, patient history, and epidemiological information.  Fact Sheet for Patients:   BoilerBrush.com.cyhttps://www.fda.gov/media/136312/download  Fact Sheet for Healthcare Providers: https://pope.com/https://www.fda.gov/media/136313/download  This test is not yet approved or  cleared by the [place] FDA and has been authorized for detection and/or diagnosis of SARS-CoV-2 by FDA under an Emergency Use Authorization (EUA).  This EUA will remain in effect (meaning this test can be used) for the duration of the COVID-19 declaration under Section 564(b)(1) of the Act, 21 U.S.C. section 360bbb-3(b)(1), unless the authorization is terminated or revoked sooner.  Performed at [facility], [address] [place], [place] [address]   SARS Coronavirus 2 by RT PCR (hospital order, performed in [facility] hospital lab) Nasopharyngeal Nasopharyngeal Swab     Status: None   Collection Time: 09/05/19  9:04 AM   Specimen: Nasopharyngeal Swab  Result Value Ref Range Status   SARS Coronavirus 2 NEGATIVE NEGATIVE Final    Comment: (NOTE) SARS-CoV-2 target nucleic acids are NOT DETECTED.  The SARS-CoV-2 RNA is generally detectable in upper and lower respiratory specimens during the acute phase of infection. The lowest concentration of SARS-CoV-2 viral  copies this assay can detect is 250 copies / mL. A negative result does not preclude SARS-CoV-2 infection and should not be used as the sole basis for treatment or other patient management decisions.  A negative result may occur with improper specimen collection / handling, submission of specimen other than nasopharyngeal swab, presence of viral mutation(s) within the areas targeted by this assay, and inadequate number of viral copies (<250 copies / mL). A negative result must be combined with clinical observations, patient history, and epidemiological information.  Fact Sheet for Patients:   BoilerBrush.com.cyhttps://www.fda.gov/media/136312/download  Fact Sheet for Healthcare Providers: https://pope.com/https://www.fda.gov/media/136313/download  This test is not yet approved or  cleared by the [place] FDA and has been authorized for detection and/or diagnosis of SARS-CoV-2 by FDA under an Emergency Use Authorization (EUA).  This EUA will remain in effect (meaning this test can be used) for the duration of the COVID-19 declaration under Section 564(b)(1) of the Act, 21 U.S.C. section 360bbb-3(b)(1), unless the authorization is terminated or revoked sooner.  Performed at [facility], [address] [place], [place] [address]     Labs: CBC: Recent Labs  Lab 01/26/22 2223 01/28/22 0427  WBC 11.9* 11.4*  HGB 13.1 12.9  HCT 38.5 38.0  MCV 93.2 93.4  PLT 233 225   Basic Metabolic Panel: Recent Labs  Lab 01/26/22 2223 01/28/22 0427  NA 138 140  K 3.3* 3.7  CL 104 108  CO2 25 24  GLUCOSE 125* 108*  BUN 13 14  CREATININE 0.86 0.81  CALCIUM 9.6 9.0   Liver Function Tests: No results for input(s): "AST", "ALT", "ALKPHOS", "BILITOT", "PROT", "ALBUMIN" in the last 168 hours. CBG: No results for input(s): "GLUCAP" in the last 168 hours.  Discharge time spent: greater than 30 minutes.  Signed: Tyrone Nine, MD [facility] 01/28/2022

## 2022-01-28 NOTE — Care Management Important Message (Signed)
Important Message  Patient Details  Name: TULIP MEHARG MRN: [id number] Date of Birth: 04-Nov-1934   Medicare Important Message Given:  Yes     Georgie Chard, LCSW 01/28/2022, 10:28 AM

## 2022-01-28 NOTE — Evaluation (Signed)
Physical Therapy Evaluation and Discharge Patient Details Name: Adriana [name] MRN: [id number] DOB: 02/03/1935 Today's Date: 01/28/2022  History of Present Illness  87 y.o. female with medical history significant of HTN, CVA, and depression presenting 01/27/22 with chest pain that started 01/24/22. CTA + for B PEs, L pleural effusion. Dopplers negative for DVT bil LEs.  Clinical Impression   Patient evaluated by Physical Therapy with no further acute PT needs identified. She had just returned to bed after walking with OT to restroom with RW and completing all her self-care with toileting (per OT present on PT arrival). Patient agreed to strength and balance assessment, but deferred walking again as she was fatigued from recent walk. She would benefit from HHPT at her ALF. See below for any follow-up Physical Therapy or equipment needs. PT is signing off as pt is being discharged from the ED to home/ALF. Thank you for this referral.        Recommendations for follow up therapy are one component of a multi-disciplinary discharge planning process, led by the attending physician.  Recommendations may be updated based on patient status, additional functional criteria and insurance authorization.  Follow Up Recommendations Home health PT      Assistance Recommended at Discharge PRN (in ALF, aides check on her several times per day)  Patient can return home with the following  A little help with bathing/dressing/bathroom;Direct supervision/assist for medications management;Direct supervision/assist for financial management;Assist for transportation    Equipment Recommendations None recommended by PT  Recommendations for Other Services       Functional Status Assessment Patient has had a recent decline in their functional status and demonstrates the ability to make significant improvements in function in a reasonable and predictable amount of time.     Precautions / Restrictions  Precautions Precautions: Fall Precaution Comments: 4 falls in past year; recently moved to ALF      Mobility  Bed Mobility Overal bed mobility: Needs Assistance Bed Mobility: Supine to Sit, Sit to Supine     Supine to sit: Min guard, HOB elevated Sit to supine: Min guard, HOB elevated   General bed mobility comments: on stretcher in ED with both head and feet elevated (unable to flatten either portion of stretcher and pt with some difficulty due to this, but did not need physical assist)    Transfers Overall transfer level: Needs assistance Equipment used: Rolling walker (2 wheels) Transfers: Sit to/from Stand Sit to Stand: Min guard, From elevated surface           General transfer comment: proper hand placement with RW; denied dizziness and no imbalance    Ambulation/Gait               General Gait Details: pt deferred ambulation as she had just walked with OT to/from bathroom; agreed to standing balance assessment  Stairs            Wheelchair Mobility    Modified Rankin (Stroke Patients Only)       Balance Overall balance assessment: Needs assistance, History of Falls Sitting-balance support: No upper extremity supported, Feet unsupported Sitting balance-Leahy Scale: Good Sitting balance - Comments: on edge of ED stretcher   Standing balance support: Bilateral upper extremity supported, Reliant on assistive device for balance Standing balance-Leahy Scale: Poor Standing balance comment: with UE support, pt is minguard assist for safety; without UE support pt with imbalance to her rt and required min assist to recover                               Pertinent Vitals/Pain Pain Assessment Pain Assessment: No/denies pain    Home Living Family/patient expects to be discharged to:: Assisted living                 Home Equipment: Wheelchair - Engineer, manufacturing systems (2 wheels);Shower seat - built in;Grab bars - tub/shower;Toilet  riser;Grab bars - toilet Additional Comments: [facility] [place]    Prior Function Prior Level of Function : Needs assist       Physical Assist : ADLs (physical)   ADLs (physical): Bathing;IADLs Mobility Comments: PWC outside of the apt, RW in the apartment. Reports 4 falls in past year ADLs Comments: Recently moved from ILF to ALF. staff assists with showering, IADLs     Hand Dominance        Extremity/Trunk Assessment   Upper Extremity Assessment Upper Extremity Assessment: Defer to OT evaluation    Lower Extremity Assessment Lower Extremity Assessment: Generalized weakness (left LE slightly weaker than RLE)    Cervical / Trunk Assessment Cervical / Trunk Assessment: Kyphotic  Communication   Communication: No difficulties  Cognition Arousal/Alertness: Awake/alert Behavior During Therapy: WFL for tasks assessed/performed Overall Cognitive Status: Within Functional Limits for tasks assessed (not specifically tested beyond orientation and following commands)                                          General Comments General comments (skin integrity, edema, etc.): VSS via telemetry throughout session    Exercises     Assessment/Plan    PT Assessment All further PT needs can be met in the next venue of care  PT Problem List Decreased strength;Decreased activity tolerance;Decreased balance;Decreased mobility       PT Treatment Interventions      PT Goals (Current goals can be found in the Care Plan section)  Acute Rehab PT Goals Patient Stated Goal: return to her apartment; agrees to [facility] therapies PT Goal Formulation: All assessment and education complete, DC therapy    Frequency       Co-evaluation               AM-PAC PT "6 Clicks" Mobility  Outcome Measure Help needed turning from your back to your side while in a flat bed without using bedrails?: None Help needed moving from lying on your back to sitting on the side of a flat  bed without using bedrails?: A Little Help needed moving to and from a bed to a chair (including a wheelchair)?: A Little Help needed standing up from a chair using your arms (e.g., wheelchair or bedside chair)?: A Little Help needed to walk in hospital room?: A Little Help needed climbing 3-5 steps with a railing? : Total 6 Click Score: 17    End of Session Equipment Utilized During Treatment: Gait belt Activity Tolerance: Patient limited by fatigue Patient left: in bed;with call bell/phone within reach (on ED stretcher) Nurse Communication: Mobility status;Other (comment) (OK for discharge with HHPT) PT Visit Diagnosis: Unsteadiness on feet (R26.81);History of falling (Z91.81)    Time: 4132-4401 PT Time Calculation (min) (ACUTE ONLY): 14 min   Charges:   PT Evaluation $PT Eval Low Complexity: [place], PT Acute Rehabilitation Services  Office [phone]   Rexanne Mano 01/28/2022, 12:18 PM

## 2022-01-28 NOTE — ED Notes (Signed)
Ambulated pt to restroom with use of walker. Pt difficulties in ambulation.

## 2022-01-28 NOTE — Evaluation (Signed)
Occupational Therapy Evaluation Patient Details Name: Adriana [name] MRN: [id number] DOB: [birth year] Today's Date: 01/28/2022   History of Present Illness 87 y.o. female with medical history significant of HTN, CVA, and depression presenting 01/27/22 with chest pain that started 01/24/22. CTA + for B PEs, L pleural effusion. Dopplers negative for DVT bil LEs.   Clinical Impression   Adriana [name] was evaluated s/p the above admission list, she is from ALF where she uses a PWC outside of the apt, a RW in the apt and staff assists with bathing and IADLs. Upon evaluation pt had functional limitations due to generalized weakness and decreased activity tolerance. Overall she requires min G for all mobility and transfers with RW. Due to deficits listed below, she requires up to min A for ADLs. Pt will benefit from OT acutely. Recommend d/c back to ALF with increased assist from staff initially and HHOT.      Recommendations for follow up therapy are one component of a multi-disciplinary discharge planning process, led by the attending physician.  Recommendations may be updated based on patient status, additional functional criteria and insurance authorization.   Follow Up Recommendations  Home health OT     Assistance Recommended at Discharge Frequent or constant Supervision/Assistance  Patient can return home with the following A little help with walking and/or transfers;A little help with bathing/dressing/bathroom;Assistance with cooking/housework;Direct supervision/assist for medications management;Direct supervision/assist for financial management;Assist for transportation;Help with stairs or ramp for entrance    Functional Status Assessment  Patient has had a recent decline in their functional status and demonstrates the ability to make significant improvements in function in a reasonable and predictable amount of time.  Equipment Recommendations  None recommended by OT    Recommendations for Other  Services       Precautions / Restrictions Precautions Precautions: Fall Precaution Comments: 4 falls in past year; recently moved to ALF Restrictions Weight Bearing Restrictions: No      Mobility Bed Mobility Overal bed mobility: Needs Assistance Bed Mobility: Supine to Sit, Sit to Supine     Supine to sit: Min guard Sit to supine: Min guard   General bed mobility comments: from stretcher    Transfers Overall transfer level: Needs assistance Equipment used: Rolling walker (2 wheels) Transfers: Sit to/from Stand Sit to Stand: Min guard                  Balance Overall balance assessment: Needs assistance Sitting-balance support: No upper extremity supported Sitting balance-Leahy Scale: Good     Standing balance support: Single extremity supported, During functional activity Standing balance-Leahy Scale: Fair Standing balance comment: able to groom standing at the sink with 1 UE supported                           ADL either performed or assessed with clinical judgement   ADL Overall ADL's : Needs assistance/impaired Eating/Feeding: Independent;Sitting   Grooming: Min guard;Standing Grooming Details (indicate cue type and reason): at the sink Upper Body Bathing: Set up;Sitting   Lower Body Bathing: Minimal assistance;Sit to/from stand   Upper Body Dressing : Set up;Sitting   Lower Body Dressing: Minimal assistance;Sit to/from stand   Toilet Transfer: Min guard;Ambulation;Regular Toilet;Rolling walker (2 wheels)   Toileting- Clothing Manipulation and Hygiene: Supervision/safety;Sitting/lateral lean       Functional mobility during ADLs: Min guard;Rolling walker (2 wheels) General ADL Comments: generalized weakness and decreased activity tolerance     Vision Baseline Vision/History:  1 Wears glasses Vision Assessment?: No apparent visual deficits     Perception Perception Perception Tested?: No   Praxis Praxis Praxis tested?: Not  tested    Pertinent Vitals/Pain Pain Assessment Pain Assessment: Faces Faces Pain Scale: Hurts a little bit Pain Location: chest with deep breaths Pain Descriptors / Indicators: Discomfort Pain Intervention(s): Monitored during session     Hand Dominance Right   Extremity/Trunk Assessment Upper Extremity Assessment Upper Extremity Assessment: Defer to OT evaluation   Lower Extremity Assessment Lower Extremity Assessment: Generalized weakness (left LE slightly weaker than RLE)   Cervical / Trunk Assessment Cervical / Trunk Assessment: Kyphotic   Communication Communication Communication: No difficulties   Cognition Arousal/Alertness: Awake/alert Behavior During Therapy: WFL for tasks assessed/performed Overall Cognitive Status: Within Functional Limits for tasks assessed                                 General Comments: WFL for basic mobility, ADLs and orientation     General Comments  VSS via telemetry throughout session    Exercises     Shoulder Instructions      Home Living Family/patient expects to be discharged to:: Assisted living                             Home Equipment: Wheelchair - Engineer, manufacturing systems (2 wheels);Shower seat - built in;Grab bars - tub/shower;Toilet riser;Grab bars - toilet   Additional Comments: [facility] [place]      Prior Functioning/Environment Prior Level of Function : Needs assist       Physical Assist : ADLs (physical)   ADLs (physical): Bathing;IADLs Mobility Comments: PWC outside of the apt, RW in the apartment. Reports 4 falls in past year ADLs Comments: Recently moved from ILF to ALF. staff assists with showering, IADLs        OT Problem List: Decreased strength;Decreased range of motion;Decreased activity tolerance;Impaired balance (sitting and/or standing)      OT Treatment/Interventions: Therapeutic exercise;Self-care/ADL training;DME and/or AE instruction;Therapeutic  activities;Patient/family education;Balance training    OT Goals(Current goals can be found in the care plan section) Acute Rehab OT Goals Patient Stated Goal: home OT Goal Formulation: With patient Time For Goal Achievement: 02/11/22 Potential to Achieve Goals: Good ADL Goals Pt Will Perform Lower Body Dressing: Independently;sit to/from stand Pt Will Transfer to Toilet: with modified independence;regular height toilet;ambulating  OT Frequency: Min 2X/week    Co-evaluation              AM-PAC OT "6 Clicks" Daily Activity     Outcome Measure Help from another person eating meals?: None Help from another person taking care of personal grooming?: A Little Help from another person toileting, which includes using toliet, bedpan, or urinal?: A Little Help from another person bathing (including washing, rinsing, drying)?: A Little Help from another person to put on and taking off regular upper body clothing?: A Little Help from another person to put on and taking off regular lower body clothing?: A Little 6 Click Score: 19   End of Session Equipment Utilized During Treatment: Rolling walker (2 wheels) Nurse Communication: Mobility status  Activity Tolerance: Patient tolerated treatment well Patient left: in bed;with call bell/phone within reach  OT Visit Diagnosis: Unsteadiness on feet (R26.81);Other abnormalities of gait and mobility (R26.89);Muscle weakness (generalized) (M62.81)  Time: 1114-1140 OT Time Calculation (min): 26 min Charges:  OT General Charges $OT Visit: 1 Visit OT Evaluation $OT Eval Moderate Complexity: 1 Mod OT Treatments $Self Care/Home Management : 8-22 mins    Elliot Cousin 01/28/2022, 12:57 PM

## 2022-01-28 NOTE — Care Management Obs Status (Signed)
MEDICARE OBSERVATION STATUS NOTIFICATION   Patient Details  Name: Adriana [name] MRN: [id number] Date of Birth: 1934/12/27   Medicare Observation Status Notification Given:  Yes    Georgie Chard, LCSW 01/28/2022, 10:28 AM

## 2022-01-28 NOTE — Progress Notes (Signed)
ANTICOAGULATION CONSULT NOTE  Pharmacy Consult for heparin Indication: pulmonary embolus  Allergies  Allergen Reactions   Contrast Media [Iodinated Contrast Media] Hives    Hives during IVP at urology office '02, requires 13 hr prep///a.calhoun  Patient came thru ER and had a 1 hour pre-medication and did fine   Oxycontin [Oxycodone] Other (See Comments)    Decreased appetite.   Boniva [Ibandronic Acid] Nausea And Vomiting and Other (See Comments)    Weakness    Codeine Diarrhea and Nausea And Vomiting   Darvocet [Propoxyphene N-Acetaminophen] Diarrhea and Nausea And Vomiting   Erythromycin Nausea And Vomiting    Patient Measurements: Height: 5\' 6"  (167.6 cm) Weight: 60.3 kg (133 lb) IBW/kg (Calculated) : 59.3 Heparin Dosing Weight: 60.3kg  Vital Signs: Temp: 97.8 F (36.6 C) (11/26 0430) Temp Source: Oral (11/26 0430) BP: 132/78 (11/26 0400) Pulse Rate: 56 (11/26 0400)  Labs: Recent Labs    01/26/22 2223 01/27/22 0035 01/27/22 1920 01/27/22 2002 01/28/22 0427  HGB 13.1  --   --   --   --   HCT 38.5  --   --   --   --   PLT 233  --   --   --   --   HEPARINUNFRC  --   --  0.95* 0.86* 0.58  CREATININE 0.86  --   --   --   --   TROPONINIHS 9 11  --   --   --      Estimated Creatinine Clearance: 44.8 mL/min (by C-G formula based on SCr of 0.86 mg/dL).   Medical History: Past Medical History:  Diagnosis Date   Anginal pain (HCC) 08/24/2010   Echo-EF =>55%,LV normal   Arthritis    Cataract 12/2006   Both eyes  Epes MD   Chest pain, unspecified 09/04/2007   Lexiscan-- EF 72%; LV normal   Constipation    Takes flax seed and honey .  Smooth Move tea.   Depression    Diverticulitis    Head injury, closed    with fall   History of cardiac cath 2012   normal coronary arteries   Hypertension    Mitral valve prolapse    Osteopenia    Stroke (HCC) 09/02/2019    Medications:  Infusions:   heparin 850 Units/hr (01/27/22 2052)    Assessment: 87 yof  presented to the ED with CP. Found to have a PE and now starting IV heparin. Baseline CBC is WNL and she is not on anticoagulation PTA.   Heparin level came back elevated at 0.95, on 1000 units/hr. Was collected from same line as infusion - to double check accuracy, obtained peripheral draw which came back still elevated at 0.86. No s/sx of bleeding or infusion issues.  11/26 AM update:  Heparin level therapeutic   Goal of Therapy:  Heparin level 0.3-0.7 units/ml Monitor platelets by anticoagulation protocol: Yes   Plan:  Cont heparin 850 units/hr Check an 8 hr heparin level   12/26, PharmD, BCPS Clinical Pharmacist Phone: [phone]

## 2022-01-28 NOTE — Progress Notes (Signed)
ANTICOAGULATION CONSULT NOTE  Pharmacy Consult for heparin>apixaban Indication: pulmonary embolus  Allergies  Allergen Reactions   Contrast Media [Iodinated Contrast Media] Hives    Hives during IVP at urology office '02, requires 13 hr prep///a.calhoun  Patient came thru ER and had a 1 hour pre-medication and did fine   Oxycontin [Oxycodone] Other (See Comments)    Decreased appetite.   Boniva [Ibandronic Acid] Nausea And Vomiting and Other (See Comments)    Weakness    Codeine Diarrhea and Nausea And Vomiting   Darvocet [Propoxyphene N-Acetaminophen] Diarrhea and Nausea And Vomiting   Erythromycin Nausea And Vomiting    Patient Measurements: Height: 5\' 6"  (167.6 cm) Weight: 60.3 kg (133 lb) IBW/kg (Calculated) : 59.3 Heparin Dosing Weight: 60.3kg  Vital Signs: Temp: 97.4 F (36.3 C) (11/26 0932) Temp Source: Oral (11/26 0932) BP: 130/88 (11/26 0930) Pulse Rate: 62 (11/26 0930)  Labs: Recent Labs    01/26/22 2223 01/27/22 0035 01/27/22 1920 01/27/22 2002 01/28/22 0427  HGB 13.1  --   --   --  12.9  HCT 38.5  --   --   --  38.0  PLT 233  --   --   --  225  HEPARINUNFRC  --   --  0.95* 0.86* 0.58  CREATININE 0.86  --   --   --  0.81  TROPONINIHS 9 11  --   --   --      Estimated Creatinine Clearance: 47.5 mL/min (by C-G formula based on SCr of 0.81 mg/dL).   Medical History: Past Medical History:  Diagnosis Date   Anginal pain (HCC) 08/24/2010   Echo-EF =>55%,LV normal   Arthritis    Cataract 12/2006   Both eyes  Epes MD   Chest pain, unspecified 09/04/2007   Lexiscan-- EF 72%; LV normal   Constipation    Takes flax seed and honey .  Smooth Move tea.   Depression    Diverticulitis    Head injury, closed    with fall   History of cardiac cath 2012   normal coronary arteries   Hypertension    Mitral valve prolapse    Osteopenia    Stroke (HCC) 09/02/2019    Medications:  Infusions:   heparin 850 Units/hr (01/27/22 2052)    Assessment: 87  yof presented to the ED with CP. Found to have a PE and now starting IV heparin. Baseline CBC is WNL and she is not on anticoagulation PTA.   Heparin therapeutic - pharmacy consulted to transition from heparin gtt to eliquis therapy for PE  CBC stable this AM  Goal of Therapy:  Heparin level 0.3-0.7 units/ml Monitor platelets by anticoagulation protocol: Yes   Plan:  Stop heparin gtt at time of first eliquis dose Eliquis 10mg  BID x7 days then eliquis 5mg  BID thereafter F/u s/sx bleeding   2053, PharmD Clinical Pharmacist 01/28/2022 10:22 AM

## 2022-01-28 NOTE — ED Notes (Signed)
Per pt's request, called daughter to advise she may be d/c later today.

## 2022-01-29 ENCOUNTER — Encounter: Payer: Self-pay | Admitting: Nurse Practitioner

## 2022-01-29 ENCOUNTER — Non-Acute Institutional Stay: Payer: Medicare PPO | Admitting: Nurse Practitioner

## 2022-01-29 ENCOUNTER — Other Ambulatory Visit (HOSPITAL_COMMUNITY): Payer: Self-pay

## 2022-01-29 DIAGNOSIS — I10 Essential (primary) hypertension: Secondary | ICD-10-CM

## 2022-01-29 DIAGNOSIS — F418 Other specified anxiety disorders: Secondary | ICD-10-CM

## 2022-01-29 DIAGNOSIS — R053 Chronic cough: Secondary | ICD-10-CM

## 2022-01-29 DIAGNOSIS — K219 Gastro-esophageal reflux disease without esophagitis: Secondary | ICD-10-CM

## 2022-01-29 DIAGNOSIS — R269 Unspecified abnormalities of gait and mobility: Secondary | ICD-10-CM

## 2022-01-29 DIAGNOSIS — M810 Age-related osteoporosis without current pathological fracture: Secondary | ICD-10-CM

## 2022-01-29 DIAGNOSIS — I3139 Other pericardial effusion (noninflammatory): Secondary | ICD-10-CM

## 2022-01-29 DIAGNOSIS — E785 Hyperlipidemia, unspecified: Secondary | ICD-10-CM

## 2022-01-29 DIAGNOSIS — I639 Cerebral infarction, unspecified: Secondary | ICD-10-CM

## 2022-01-29 DIAGNOSIS — I2699 Other pulmonary embolism without acute cor pulmonale: Secondary | ICD-10-CM

## 2022-01-29 DIAGNOSIS — K5901 Slow transit constipation: Secondary | ICD-10-CM | POA: Diagnosis not present

## 2022-01-29 NOTE — Assessment & Plan Note (Signed)
,   takes Fosamax, Ca, Vit D, t score -2.3 10/25/20

## 2022-01-29 NOTE — Assessment & Plan Note (Signed)
Stable, takes Pantoprazole, bloated at times. Hgb 12.9 01/28/22

## 2022-01-29 NOTE — Assessment & Plan Note (Signed)
Chronic occasional dry cough for about a year, left side throat tickles sometimes during eating, then cough, ? Resultant of CVA. . ST: no apparent swallowing problem.  

## 2022-01-29 NOTE — Assessment & Plan Note (Signed)
CTA 01/27/22 identified again, f/u Cardiology.

## 2022-01-29 NOTE — Assessment & Plan Note (Signed)
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 Takes Plavix. Atorvastatin. affected gait and vision issues, feels weak left leg.  

## 2022-01-29 NOTE — Assessment & Plan Note (Signed)
Stable, continue Colace.  

## 2022-01-29 NOTE — Assessment & Plan Note (Signed)
Blood pressure is controlled, takes Amlodipine, Metoprolol. Bun/creat 14/0.81 01/28/22 

## 2022-01-29 NOTE — Assessment & Plan Note (Signed)
takes Atorvastatin, LDL 58 10/19/21 

## 2022-01-29 NOTE — Assessment & Plan Note (Signed)
falls, uses walker, power w/c 

## 2022-01-29 NOTE — Progress Notes (Unsigned)
Location:  [facility] Room Number: AL/904/A Place of Service:  ALF (13) Provider:  Adeli Frost X, NP  Patient Care Team: Frederica KusterMiller, Stephen M, MD as PCP - General (Family Medicine) Jodelle Redhristopher, Bridgette, MD as PCP - Cardiology (Cardiology) Idalie Canto X, NP as Nurse Practitioner (Internal Medicine) Marzella SchleinWood, Craig H., MD (Ophthalmology) Arminda ResidesJones, Daniel, MD as Consulting Physician (Dermatology) Arminda ResidesJones, Daniel, MD as Consulting Physician (Dermatology) Teodora MediciMezer, Howard, MD as Consulting Physician (Gynecology)  Extended Emergency Contact Information Primary Emergency Contact: Aline BrochureLindsay,Julie  [place] Mobile Phone: [phone] Relation: Daughter Secondary Emergency Contact: Black,Joel Mobile Phone: [phone] Relation: Son  Code Status:  FULL Goals of care: Advanced Directive information    01/26/2022   10:08 PM  Advanced Directives  Does Patient Have a Medical Advance Directive? No     Chief Complaint  Patient presents with   Acute Visit    Patient is here for follow up after hospital stay     HPI:  Pt is a 87 y.o. female seen today for an acute visit for ED evaluation for chest pain, CTA 01/27/22 showed multiple subsegmental pulmonary emboli and pericardial effusion identified. She was placed on Eliquis, needs f/u Cardiology. Admitted DOE, but denied chest pain, cough, or phlegm production upon my examination, she is afebrile, no O2 desaturation. EF 65-70% 01/27/22. Venous US BLE no DVT.     HTN, takes Amlodipine, Metoprolol. Bun/creat 14/0.81 01/28/22             Constipation, takes Colace qd.                                   GERD, takes Pantoprazole, bloated at times. 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 Takes Plavix. Atorvastatin. affected gait and vision issues, feels weak left leg.              Gait abnormality, falls, uses  walker, power w/c             Chronic occasional dry cough for about a year, left side throat tickles sometimes during eating, then cough, ? Resultant of CVA. . ST: no apparent swallowing problem.              OP, takes Fosamax, Ca, Vit D, t score -2.3 10/25/20             Depression, takes Citalopram             Pericardial effusion, Hx of it, present again on CTA 01/27/22             Hyperlipidemia, takes Atorvastatin, LDL 58 10/19/21             OA pain, Using walker/pressure             Urinary incontinence, progressing  Past Medical History:  Diagnosis Date   Anginal pain (HCC) 08/24/2010   Echo-EF =>55%,LV normal   Arthritis    Cataract 12/2006   Both eyes  Epes MD   Chest pain, unspecified 09/04/2007   Lexiscan-- EF 72%; LV normal   Constipation    Takes flax seed and honey .  Smooth Move tea.   Depression    Diverticulitis    Head injury, closed    with fall   History of cardiac cath  2012   normal coronary arteries   Hypertension    Mitral valve prolapse    Osteopenia    Stroke (HCC) 09/02/2019   Past Surgical History:  Procedure Laterality Date   ANTERIOR CERVICAL DECOMP/DISCECTOMY FUSION N/A 10/23/2012   Procedure: Cervical Five to Cervical Six, Cervical Six to Cervical Seven anterior cervical decompression with fusion plating and bonegraft;  Surgeon: Hewitt Shorts, MD;  Location: [facility];  Service: Neurosurgery;  Laterality: N/A;  ANTERIOR CERVICAL DECOMPRESSION/DISCECTOMY FUSION 2 LEVELS   BREAST CYST ASPIRATION  1990   benign   CARDIAC CATHETERIZATION  08/01/2010   normal coronaries   COLONOSCOPY  approx 2011   EYE SURGERY Bilateral 2011   intestinal blockage surgery  Feb. 2012   "kink in small intestine" per pt    Allergies  Allergen Reactions   Contrast Media [Iodinated Contrast Media] Hives    Hives during IVP at urology office '02, requires 13 hr prep///[email or web address]  Patient came thru ER and had a 1 hour pre-medication and did fine   Oxycontin  [Oxycodone] Other (See Comments)    Decreased appetite.   Boniva [Ibandronic Acid] Nausea And Vomiting and Other (See Comments)    Weakness    Codeine Diarrhea and Nausea And Vomiting   Darvocet [Propoxyphene N-Acetaminophen] Diarrhea and Nausea And Vomiting   Erythromycin Nausea And Vomiting    Outpatient Encounter Medications as of 01/29/2022  Medication Sig   acetaminophen (TYLENOL) 325 MG tablet Take 325-650 mg by mouth See admin instructions. 2 entries: 650 mg at bedtime + 325 mg every 4 hours as needed for fever >100.7F, pain, headache.   alendronate (FOSAMAX) 70 MG tablet TAKE 1 TAB ONCE A WEEK, AT LEAST 30 MIN BEFORE 1ST FOOD.DO NOT LIE DOWN FOR 30 MIN AFTER TAKING. (Patient taking differently: Take 70 mg by mouth every Thursday.)   amLODipine (NORVASC) 5 MG tablet Take 5 mg by mouth in the morning.   apixaban (ELIQUIS) 5 MG TABS tablet Take 2 tablets (10 mg total) by mouth 2 (two) times daily for 7 days, THEN 1 tablet (5 mg total) 2 (two) times daily for 23 days.   atorvastatin (LIPITOR) 20 MG tablet TAKE ONE TABLET BY MOUTH ONCE DAILY. (Patient taking differently: Take 20 mg by mouth in the morning.)   Calcium Carbonate-Vitamin D (CALCIUM 600 +D HIGH POTENCY) 600-10 MG-MCG TABS Take 1 tablet by mouth in the morning.   citalopram (CELEXA) 20 MG tablet Take 1 tablet (20 mg total) by mouth daily. (Patient taking differently: Take 20 mg by mouth at bedtime.)   clopidogrel (PLAVIX) 75 MG tablet TAKE 1 TABLET ONCE DAILY. (Patient taking differently: Take 75 mg by mouth in the morning.)   docusate sodium (COLACE) 100 MG capsule Take 100 mg by mouth at bedtime.   metoprolol tartrate (LOPRESSOR) 25 MG tablet Take 12.5 mg by mouth 2 (two) times daily.   pantoprazole (PROTONIX) 40 MG tablet TAKE 1 TABLET ONCE DAILY. (Patient taking differently: Take 40 mg by mouth in the morning.)   No facility-administered encounter medications on file as of 01/29/2022.    Review of Systems   Constitutional:  Negative for activity change, appetite change and fever.  HENT:  Positive for hearing loss. Negative for congestion and trouble swallowing.   Eyes:  Negative for visual disturbance.  Respiratory:  Positive for cough. Negative for chest tightness, shortness of breath and wheezing.        Chronic occasional cough with clear phlegm. DOE since 01/27/22  Cardiovascular:  Negative for chest pain, palpitations and leg swelling.  Gastrointestinal:  Negative for abdominal pain and constipation.  Genitourinary:  Negative for dysuria, frequency and urgency.  Musculoskeletal:  Positive for arthralgias, back pain and gait problem.  Skin:  Negative for color change.  Neurological:  Positive for weakness. Negative for speech difficulty and headaches.       Memory lapses occasionally. Left sided weakness.   Psychiatric/Behavioral:  Negative for behavioral problems and sleep disturbance. The patient is not nervous/anxious.        Depressed, not interested in activities, doesn't want to live like this    Immunization History  Administered Date(s) Administered   Fluad Quad(high Dose 65+) 12/17/2018   Influenza, High Dose Seasonal PF 12/08/2016, 12/19/2020   Influenza-Unspecified 12/16/2019   Moderna Sars-Covid-2 Vaccination 03/09/2019, 04/06/2019, 01/12/2020   Pneumococcal Conjugate-13 06/12/2013   Pneumococcal Polysaccharide-23 07/28/2002   Tdap 07/27/2009   Zoster Recombinat (Shingrix) 01/08/2017, 06/03/2017   Pertinent  Health Maintenance Due  Topic Date Due   INFLUENZA VACCINE  10/03/2021   DEXA SCAN  Completed      09/27/2021    2:10 PM 11/01/2021    1:38 PM 01/26/2022   10:08 PM 01/27/2022   10:25 AM 01/28/2022    8:15 AM  Fall Risk  Falls in the past year? 0 1     Was there an injury with Fall? 0 0     Fall Risk Category Calculator 0 1     Fall Risk Category Low Low     Patient Fall Risk Level Low fall risk Low fall risk Low fall risk Low fall risk Moderate fall risk   Patient at Risk for Falls Due to History of fall(s) History of fall(s)      Functional Status Survey:    Vitals:   01/29/22 1137  BP: 118/74  Pulse: 76  Resp: 18  Temp: (!) 97 F (36.1 C)  SpO2: 96%  Weight: 148 lb (67.1 kg)  Height: 5\' 6"  (1.676 m)   Body mass index is 23.89 kg/m. Physical Exam Vitals and nursing note reviewed.  Constitutional:      Appearance: Normal appearance.  HENT:     Head: Normocephalic and atraumatic.     Mouth/Throat:     Mouth: Mucous membranes are moist.  Eyes:     Extraocular Movements: Extraocular movements intact.     Conjunctiva/sclera: Conjunctivae normal.     Pupils: Pupils are equal, round, and reactive to light.     Comments: Lateral right eye peripheral vision field loss since CVA 11/2018  Cardiovascular:     Rate and Rhythm: Normal rate and regular rhythm.     Heart sounds: No murmur heard. Pulmonary:     Effort: Pulmonary effort is normal.     Breath sounds: No wheezing, rhonchi or rales.  Abdominal:     General: Bowel sounds are normal.     Palpations: Abdomen is soft.     Tenderness: There is no abdominal tenderness.  Musculoskeletal:     Cervical back: Normal range of motion and neck supple.     Right lower leg: No edema.     Left lower leg: No edema.     Comments: Kyphoscoliosis.   Skin:    General: Skin is warm and dry.  Neurological:     General: No focal deficit present.     Mental Status: She is alert and oriented to person, place, and time. Mental status is at baseline.     Cranial Nerves:  No cranial nerve deficit.     Motor: Weakness present.     Coordination: Coordination normal.     Gait: Gait abnormal.     Deep Tendon Reflexes: Reflexes normal.     Comments: Walker for ambulation. Left sided weakness with muscle strength 5/5.  Psychiatric:        Mood and Affect: Mood normal.        Behavior: Behavior normal.     Labs reviewed: Recent Labs    01/26/22 2223 01/28/22 0427  NA 138 140  K 3.3* 3.7   CL 104 108  CO2 25 24  GLUCOSE 125* 108*  BUN 13 14  CREATININE 0.86 0.81  CALCIUM 9.6 9.0   No results for input(s): "AST", "ALT", "ALKPHOS", "BILITOT", "PROT", "ALBUMIN" in the last 8760 hours. Recent Labs    10/19/21 0729 01/26/22 2223 01/28/22 0427  WBC 5.4 11.9* 11.4*  NEUTROABS 3,397  --   --   HGB 13.0 13.1 12.9  HCT 38.0 38.5 38.0  MCV 92.0 93.2 93.4  PLT 239 233 225   Lab Results  Component Value Date   TSH 4.05 06/30/2019   Lab Results  Component Value Date   HGBA1C 5.3 09/03/2019   Lab Results  Component Value Date   CHOL 115 10/19/2021   HDL 43 (L) 10/19/2021   LDLCALC 58 10/19/2021   TRIG 67 10/19/2021   CHOLHDL 2.7 10/19/2021    Significant Diagnostic Results in last 30 days:  VAS [place] LOWER EXTREMITY VENOUS (DVT)  Result Date: 01/27/2022  Lower Venous DVT Study Patient Name:  YASUKO LAPAGE Koehne  Date of Exam:   01/27/2022 Medical Rec #: [id number]    Accession #:    [id number] Date of Birth: [birth year]    Patient Gender: F Patient Age:   51 years Exam Location:  [facility] Procedure:      VAS [place] LOWER EXTREMITY VENOUS (DVT) Referring Phys: Mertha Baars --------------------------------------------------------------------------------  Indications: Pulmonary embolism. Left leg pain.  Risk Factors: Limited mobility. Comparison Study: No prior studies. Performing Technologist: Jean Rosenthal RDMS, RVT  Examination Guidelines: A complete evaluation includes B-mode imaging, spectral Doppler, color Doppler, and power Doppler as needed of all accessible portions of each vessel. Bilateral testing is considered an integral part of a complete examination. Limited examinations for reoccurring indications may be performed as noted. The reflux portion of the exam is performed with the patient in reverse Trendelenburg.  +---------+---------------+---------+-----------+----------+--------------+ RIGHT    CompressibilityPhasicitySpontaneityPropertiesThrombus Aging  +---------+---------------+---------+-----------+----------+--------------+ CFV      Full           Yes      Yes                                 +---------+---------------+---------+-----------+----------+--------------+ SFJ      Full                                                        +---------+---------------+---------+-----------+----------+--------------+ FV Prox  Full                                                        +---------+---------------+---------+-----------+----------+--------------+  FV Mid   Full                                                        +---------+---------------+---------+-----------+----------+--------------+ FV DistalFull                                                        +---------+---------------+---------+-----------+----------+--------------+ PFV      Full                                                        +---------+---------------+---------+-----------+----------+--------------+ POP      Full           Yes      Yes                                 +---------+---------------+---------+-----------+----------+--------------+ PTV      Full                                                        +---------+---------------+---------+-----------+----------+--------------+ PERO     Full                                                        +---------+---------------+---------+-----------+----------+--------------+ Gastroc  Full                                                        +---------+---------------+---------+-----------+----------+--------------+   +---------+---------------+---------+-----------+----------+--------------+ LEFT     CompressibilityPhasicitySpontaneityPropertiesThrombus Aging +---------+---------------+---------+-----------+----------+--------------+ CFV      Full           Yes      Yes                                  +---------+---------------+---------+-----------+----------+--------------+ SFJ      Full                                                        +---------+---------------+---------+-----------+----------+--------------+ FV Prox  Full                                                        +---------+---------------+---------+-----------+----------+--------------+  FV Mid   Full                                                        +---------+---------------+---------+-----------+----------+--------------+ FV DistalFull                                                        +---------+---------------+---------+-----------+----------+--------------+ PFV      Full                                                        +---------+---------------+---------+-----------+----------+--------------+ POP      Full           Yes      Yes                                 +---------+---------------+---------+-----------+----------+--------------+ PTV      Full                                                        +---------+---------------+---------+-----------+----------+--------------+ PERO     Full                                                        +---------+---------------+---------+-----------+----------+--------------+ Gastroc  Full                                                        +---------+---------------+---------+-----------+----------+--------------+     Summary: RIGHT: - There is no evidence of deep vein thrombosis in the lower extremity.  - No cystic structure found in the popliteal fossa.  LEFT: - There is no evidence of deep vein thrombosis in the lower extremity.  - No cystic structure found in the popliteal fossa.  *See table(s) above for measurements and observations. Electronically signed by Coral Else MD on 01/27/2022 at 8:50:53 PM.    Final    ECHOCARDIOGRAM COMPLETE  Result Date: 01/27/2022    ECHOCARDIOGRAM REPORT   Patient  Name:   Olympia Adelsberger Regnier Date of Exam: 01/27/2022 Medical Rec #:  [id number]   Height:       66.0 in Accession #:    [id number]  Weight:       133.0 lb Date of Birth:  June 03, 1934   BSA:          1.681 m Patient Age:    87 years    BP:  146/84 mmHg Patient Gender: F           HR:           92 bpm. Exam Location:  Inpatient Procedure: 2D Echo, Cardiac Doppler and Color Doppler STAT ECHO Indications:    Pericardial effusion  History:        Patient has prior history of Echocardiogram examinations, most                 recent 09/02/2019. CHF; Risk Factors:Hypertension and                 Dyslipidemia. Hx CVA. Pulmonary embolus.  Sonographer:    Ross Ludwig RDCS (AE) Referring Phys: Jonah Blue IMPRESSIONS  1. Left ventricular ejection fraction, by estimation, is 65 to 70%. The left ventricle has normal function. The left ventricle has no regional wall motion abnormalities. There is mild concentric left ventricular hypertrophy. Left ventricular diastolic parameters are indeterminate.  2. Right ventricular systolic function is normal. The right ventricular size is normal. Tricuspid regurgitation signal is inadequate for assessing PA pressure.  3. Moderate pericardial effusion. The pericardial effusion is circumferential. There is no evidence of cardiac tamponade.  4. The mitral valve is degenerative. Trivial mitral valve regurgitation.  5. The aortic valve is tricuspid. Aortic valve regurgitation is not visualized.  6. Unable to estimate CVP. Comparison(s): Prior images reviewed side by side. Moderate, circumferential pericardial effusion, slightly larger posteriorly in comparison, but anterior collection similar. No obvious tamponade physiology. FINDINGS  Left Ventricle: Left ventricular ejection fraction, by estimation, is 65 to 70%. The left ventricle has normal function. The left ventricle has no regional wall motion abnormalities. The left ventricular internal cavity size was normal in size. There is  mild  concentric left ventricular hypertrophy. Left ventricular diastolic parameters are indeterminate. Right Ventricle: The right ventricular size is normal. No increase in right ventricular wall thickness. Right ventricular systolic function is normal. Tricuspid regurgitation signal is inadequate for assessing PA pressure. Left Atrium: Left atrial size was normal in size. Right Atrium: Right atrial size was normal in size. Pericardium: A moderately sized pericardial effusion is present. The pericardial effusion is circumferential. There is no evidence of cardiac tamponade. Mitral Valve: The mitral valve is degenerative in appearance. There is mild calcification of the mitral valve leaflet(s). Trivial mitral valve regurgitation. Tricuspid Valve: The tricuspid valve is grossly normal. Tricuspid valve regurgitation is trivial. Aortic Valve: The aortic valve is tricuspid. There is mild aortic valve annular calcification. Aortic valve regurgitation is not visualized. Pulmonic Valve: The pulmonic valve was not well visualized. Pulmonic valve regurgitation is not visualized. Aorta: The aortic root is normal in size and structure. Venous: Unable to estimate CVP. The inferior vena cava was not well visualized. IAS/Shunts: No atrial level shunt detected by color flow Doppler.  LEFT VENTRICLE PLAX 2D LVIDd:         3.50 cm LVIDs:         2.20 cm LV PW:         1.20 cm LV IVS:        1.30 cm LVOT diam:     1.70 cm LVOT Area:     2.27 cm  RIGHT VENTRICLE RV Basal diam:  3.50 cm RV S prime:     21.40 cm/s TAPSE (M-mode): 1.8 cm LEFT ATRIUM             Index        RIGHT ATRIUM  Index LA diam:        3.60 cm 2.14 cm/m   RA Area:     9.95 cm LA Vol (A2C):   22.2 ml 13.20 ml/m  RA Volume:   21.50 ml 12.79 ml/m LA Vol (A4C):   41.7 ml 24.80 ml/m LA Biplane Vol: 32.3 ml 19.21 ml/m   AORTA Ao Root diam: 2.50 cm Ao Asc diam:  3.30 cm  SHUNTS Systemic Diam: 1.70 cm Nona Dell MD Electronically signed by Nona Dell MD  Signature Date/Time: 01/27/2022/1:29:35 PM    Final    CT Angio Chest PE W and/or Wo Contrast  Result Date: 01/27/2022 CLINICAL DATA:  Chest pain EXAM: CT ANGIOGRAPHY CHEST WITH CONTRAST TECHNIQUE: Multidetector CT imaging of the chest was performed using the standard protocol during bolus administration of intravenous contrast. Multiplanar CT image reconstructions and MIPs were obtained to evaluate the vascular anatomy. RADIATION DOSE REDUCTION: This exam was performed according to the departmental dose-optimization program which includes automated exposure control, adjustment of the mA and/or kV according to patient size and/or use of iterative reconstruction technique. CONTRAST:  125 mL OMNIPAQUE IOHEXOL 350 MG/ML SOLN COMPARISON:  08/02/2010 FINDINGS: Cardiovascular: Cardiomegaly. There is pericardial effusion with thickness of up to 1 cm. No aortic aneurysm or dissection. Left lower lobe and lingular pulmonary artery filling defects consistent with PE. Small branch PE right lower lobe. Mediastinum/Nodes: No suspicious adenopathy identified. Lungs/Pleura: Moderate-sized left-sided pleural effusion some of which is loculated medially. Dependent bibasilar subsegmental atelectasis and ground-glass densities consistent with pneumonitis or edema. Upper Abdomen: Bilateral renal cysts identified, partially imaged. Right lobe hepatic cyst, 1.9 cm. Musculoskeletal: No chest wall abnormality. No acute or significant osseous findings. Review of the MIP images confirms the above findings. IMPRESSION: 1. Bilateral pulmonary emboli. 2. No aortic aneurysm or dissection. 3. Moderate left-sided pleural effusion. 4. Dependent bibasilar subsegmental atelectasis or pneumonitis. 5. Pericardial effusion. 6. Bilateral renal and hepatic cysts. Findings were discussed with and acknowledged by PA Lauren Autry 10:40 a.m. 01/27/2022. Electronically Signed   By: Layla Maw M.D.   On: 01/27/2022 10:42   DG Chest 2 View  Result  Date: 01/26/2022 CLINICAL DATA:  chest pain, left shoulder pain EXAM: CHEST - 2 VIEW COMPARISON:  Chest x-ray 10/21/2012, CT chest 08/02/2010 FINDINGS: The heart and mediastinal contours are within normal limits. Bibasilar streaky airspace opacities likely representing atelectasis. Question developing right middle lobe airspace opacity. No pulmonary edema. No pleural effusion. No pneumothorax. No acute osseous abnormality. Please see separately dictated left shoulder radiograph 01/26/2022. IMPRESSION: 1.  Question developing right middle lobe airspace opacity. 2. Bibasilar streaky airspace opacities likely representing atelectasis. Electronically Signed   By: Tish Frederickson M.D.   On: 01/26/2022 23:20   DG Shoulder Left  Result Date: 01/26/2022 CLINICAL DATA:  Left shoulder and breast pain, initial encounter EXAM: LEFT SHOULDER - 2+ VIEW COMPARISON:  08/02/2012 FINDINGS: Degenerative changes of the acromioclavicular joint are seen. No acute fracture or dislocation is seen. The underlying bony thorax is within normal limits. IMPRESSION: Degenerative change without acute abnormality. Electronically Signed   By: Alcide Clever M.D.   On: 01/26/2022 23:13    Assessment/Plan Pulmonary embolus (HCC) CTA 01/27/22 showed multiple subsegmental pulmonary emboli and pericardial effusion identified. She was placed on Eliquis, needs f/u Cardiology. Admitted DOE, but denied chest pain, cough, or phlegm production upon my examination, she is afebrile, no O2 desaturation. EF 65-70% 01/27/22. Venous US BLE no DVT.   Hypertension Blood pressure is controlled,  takes  Amlodipine, Metoprolol. Bun/creat 14/0.81 01/28/22  Pericardial effusion CTA 01/27/22 identified again, f/u Cardiology.  Slow transit constipation Stable, continue Colace  GERD (gastroesophageal reflux disease) Stable, takes Pantoprazole, bloated at times. Hgb 12.9 01/28/22  Ischemic stroke [facility])   CVA 2nd to thrombosis of right posterior cerebral  artery(hospitalized 6/30-7/3), Hx of CVA left occipital cortex 9/20. Takes Plavix. Atorvastatin. affected gait and vision issues, feels weak left leg.   Gait abnormality falls, uses walker, power w/c  Cough Chronic occasional dry cough for about a year, left side throat tickles sometimes during eating, then cough, ? Resultant of CVA. . ST: no apparent swallowing problem.   Osteoporosis , takes Fosamax, Ca, Vit D, t score -2.3 10/25/20  Depression with anxiety Her mood is stale, takes Citalopram  Hyperlipidemia takes Atorvastatin, LDL 58 10/19/21     Family/ staff Communication: plan of care reviewed with the patient and charge nurse.   Labs/tests ordered: none  Time spend 40 minutes.

## 2022-01-29 NOTE — Assessment & Plan Note (Signed)
Her mood is stale, takes Citalopram

## 2022-01-29 NOTE — Assessment & Plan Note (Signed)
CTA 01/27/22 showed multiple subsegmental pulmonary emboli and pericardial effusion identified. She was placed on Eliquis, needs f/u Cardiology. Admitted DOE, but denied chest pain, cough, or phlegm production upon my examination, she is afebrile, no O2 desaturation. EF 65-70% 01/27/22. Venous US BLE no DVT.

## 2022-01-30 DIAGNOSIS — R293 Abnormal posture: Secondary | ICD-10-CM | POA: Diagnosis not present

## 2022-01-30 DIAGNOSIS — M5459 Other low back pain: Secondary | ICD-10-CM | POA: Diagnosis not present

## 2022-01-30 DIAGNOSIS — M6281 Muscle weakness (generalized): Secondary | ICD-10-CM | POA: Diagnosis not present

## 2022-01-30 DIAGNOSIS — R29898 Other symptoms and signs involving the musculoskeletal system: Secondary | ICD-10-CM | POA: Diagnosis not present

## 2022-01-30 DIAGNOSIS — R2681 Unsteadiness on feet: Secondary | ICD-10-CM | POA: Diagnosis not present

## 2022-02-01 DIAGNOSIS — R293 Abnormal posture: Secondary | ICD-10-CM | POA: Diagnosis not present

## 2022-02-01 DIAGNOSIS — R2681 Unsteadiness on feet: Secondary | ICD-10-CM | POA: Diagnosis not present

## 2022-02-01 DIAGNOSIS — M5459 Other low back pain: Secondary | ICD-10-CM | POA: Diagnosis not present

## 2022-02-01 DIAGNOSIS — M6281 Muscle weakness (generalized): Secondary | ICD-10-CM | POA: Diagnosis not present

## 2022-02-01 DIAGNOSIS — R29898 Other symptoms and signs involving the musculoskeletal system: Secondary | ICD-10-CM | POA: Diagnosis not present

## 2022-02-02 DIAGNOSIS — R29898 Other symptoms and signs involving the musculoskeletal system: Secondary | ICD-10-CM | POA: Diagnosis not present

## 2022-02-02 DIAGNOSIS — R293 Abnormal posture: Secondary | ICD-10-CM | POA: Diagnosis not present

## 2022-02-02 DIAGNOSIS — R2681 Unsteadiness on feet: Secondary | ICD-10-CM | POA: Diagnosis not present

## 2022-02-02 DIAGNOSIS — M5459 Other low back pain: Secondary | ICD-10-CM | POA: Diagnosis not present

## 2022-02-02 DIAGNOSIS — M6281 Muscle weakness (generalized): Secondary | ICD-10-CM | POA: Diagnosis not present

## 2022-02-06 DIAGNOSIS — M5459 Other low back pain: Secondary | ICD-10-CM | POA: Diagnosis not present

## 2022-02-06 DIAGNOSIS — R29898 Other symptoms and signs involving the musculoskeletal system: Secondary | ICD-10-CM | POA: Diagnosis not present

## 2022-02-06 DIAGNOSIS — R2681 Unsteadiness on feet: Secondary | ICD-10-CM | POA: Diagnosis not present

## 2022-02-06 DIAGNOSIS — M6281 Muscle weakness (generalized): Secondary | ICD-10-CM | POA: Diagnosis not present

## 2022-02-06 DIAGNOSIS — R293 Abnormal posture: Secondary | ICD-10-CM | POA: Diagnosis not present

## 2022-02-08 DIAGNOSIS — R29898 Other symptoms and signs involving the musculoskeletal system: Secondary | ICD-10-CM | POA: Diagnosis not present

## 2022-02-08 DIAGNOSIS — M6281 Muscle weakness (generalized): Secondary | ICD-10-CM | POA: Diagnosis not present

## 2022-02-08 DIAGNOSIS — R293 Abnormal posture: Secondary | ICD-10-CM | POA: Diagnosis not present

## 2022-02-08 DIAGNOSIS — M5459 Other low back pain: Secondary | ICD-10-CM | POA: Diagnosis not present

## 2022-02-08 DIAGNOSIS — R2681 Unsteadiness on feet: Secondary | ICD-10-CM | POA: Diagnosis not present

## 2022-02-12 DIAGNOSIS — M5459 Other low back pain: Secondary | ICD-10-CM | POA: Diagnosis not present

## 2022-02-12 DIAGNOSIS — R2681 Unsteadiness on feet: Secondary | ICD-10-CM | POA: Diagnosis not present

## 2022-02-12 DIAGNOSIS — M6281 Muscle weakness (generalized): Secondary | ICD-10-CM | POA: Diagnosis not present

## 2022-02-12 DIAGNOSIS — F330 Major depressive disorder, recurrent, mild: Secondary | ICD-10-CM | POA: Diagnosis not present

## 2022-02-12 DIAGNOSIS — R293 Abnormal posture: Secondary | ICD-10-CM | POA: Diagnosis not present

## 2022-02-12 DIAGNOSIS — R29898 Other symptoms and signs involving the musculoskeletal system: Secondary | ICD-10-CM | POA: Diagnosis not present

## 2022-02-13 DIAGNOSIS — R29898 Other symptoms and signs involving the musculoskeletal system: Secondary | ICD-10-CM | POA: Diagnosis not present

## 2022-02-13 DIAGNOSIS — R2681 Unsteadiness on feet: Secondary | ICD-10-CM | POA: Diagnosis not present

## 2022-02-13 DIAGNOSIS — M5459 Other low back pain: Secondary | ICD-10-CM | POA: Diagnosis not present

## 2022-02-13 DIAGNOSIS — M6281 Muscle weakness (generalized): Secondary | ICD-10-CM | POA: Diagnosis not present

## 2022-02-13 DIAGNOSIS — R293 Abnormal posture: Secondary | ICD-10-CM | POA: Diagnosis not present

## 2022-02-14 DIAGNOSIS — M5459 Other low back pain: Secondary | ICD-10-CM | POA: Diagnosis not present

## 2022-02-14 DIAGNOSIS — M6281 Muscle weakness (generalized): Secondary | ICD-10-CM | POA: Diagnosis not present

## 2022-02-14 DIAGNOSIS — R2681 Unsteadiness on feet: Secondary | ICD-10-CM | POA: Diagnosis not present

## 2022-02-14 DIAGNOSIS — R293 Abnormal posture: Secondary | ICD-10-CM | POA: Diagnosis not present

## 2022-02-14 DIAGNOSIS — R29898 Other symptoms and signs involving the musculoskeletal system: Secondary | ICD-10-CM | POA: Diagnosis not present

## 2022-02-15 DIAGNOSIS — M6281 Muscle weakness (generalized): Secondary | ICD-10-CM | POA: Diagnosis not present

## 2022-02-15 DIAGNOSIS — R29898 Other symptoms and signs involving the musculoskeletal system: Secondary | ICD-10-CM | POA: Diagnosis not present

## 2022-02-15 DIAGNOSIS — R293 Abnormal posture: Secondary | ICD-10-CM | POA: Diagnosis not present

## 2022-02-15 DIAGNOSIS — M5459 Other low back pain: Secondary | ICD-10-CM | POA: Diagnosis not present

## 2022-02-15 DIAGNOSIS — R2681 Unsteadiness on feet: Secondary | ICD-10-CM | POA: Diagnosis not present

## 2022-02-19 DIAGNOSIS — R2681 Unsteadiness on feet: Secondary | ICD-10-CM | POA: Diagnosis not present

## 2022-02-19 DIAGNOSIS — R29898 Other symptoms and signs involving the musculoskeletal system: Secondary | ICD-10-CM | POA: Diagnosis not present

## 2022-02-19 DIAGNOSIS — R293 Abnormal posture: Secondary | ICD-10-CM | POA: Diagnosis not present

## 2022-02-19 DIAGNOSIS — M6281 Muscle weakness (generalized): Secondary | ICD-10-CM | POA: Diagnosis not present

## 2022-02-19 DIAGNOSIS — M5459 Other low back pain: Secondary | ICD-10-CM | POA: Diagnosis not present

## 2022-02-20 DIAGNOSIS — M6281 Muscle weakness (generalized): Secondary | ICD-10-CM | POA: Diagnosis not present

## 2022-02-20 DIAGNOSIS — M5459 Other low back pain: Secondary | ICD-10-CM | POA: Diagnosis not present

## 2022-02-20 DIAGNOSIS — R293 Abnormal posture: Secondary | ICD-10-CM | POA: Diagnosis not present

## 2022-02-20 DIAGNOSIS — R29898 Other symptoms and signs involving the musculoskeletal system: Secondary | ICD-10-CM | POA: Diagnosis not present

## 2022-02-20 DIAGNOSIS — R2681 Unsteadiness on feet: Secondary | ICD-10-CM | POA: Diagnosis not present

## 2022-02-21 ENCOUNTER — Encounter: Payer: Self-pay | Admitting: Family Medicine

## 2022-02-21 ENCOUNTER — Non-Acute Institutional Stay (INDEPENDENT_AMBULATORY_CARE_PROVIDER_SITE_OTHER): Payer: Medicare PPO | Admitting: Family Medicine

## 2022-02-21 VITALS — BP 128/82 | HR 60 | Temp 96.7°F | Ht 66.0 in | Wt 153.8 lb

## 2022-02-21 DIAGNOSIS — H547 Unspecified visual loss: Secondary | ICD-10-CM

## 2022-02-21 DIAGNOSIS — I2699 Other pulmonary embolism without acute cor pulmonale: Secondary | ICD-10-CM

## 2022-02-21 DIAGNOSIS — F418 Other specified anxiety disorders: Secondary | ICD-10-CM

## 2022-02-21 DIAGNOSIS — R29898 Other symptoms and signs involving the musculoskeletal system: Secondary | ICD-10-CM | POA: Diagnosis not present

## 2022-02-21 DIAGNOSIS — R269 Unspecified abnormalities of gait and mobility: Secondary | ICD-10-CM | POA: Diagnosis not present

## 2022-02-21 DIAGNOSIS — M6281 Muscle weakness (generalized): Secondary | ICD-10-CM | POA: Diagnosis not present

## 2022-02-21 DIAGNOSIS — R2681 Unsteadiness on feet: Secondary | ICD-10-CM | POA: Diagnosis not present

## 2022-02-21 DIAGNOSIS — R293 Abnormal posture: Secondary | ICD-10-CM | POA: Diagnosis not present

## 2022-02-21 DIAGNOSIS — M5459 Other low back pain: Secondary | ICD-10-CM | POA: Diagnosis not present

## 2022-02-21 NOTE — Progress Notes (Signed)
Provider:  Jacalyn Lefevre, MD  Careteam: Patient Care Team: Frederica Kuster, MD as PCP - General (Family Medicine) [name], MD as PCP - Cardiology (Cardiology) [name], [name], NP as Nurse Practitioner (Internal Medicine) Marzella Schlein., MD (Ophthalmology) Arminda Resides, MD as Consulting Physician (Dermatology) Arminda Resides, MD as Consulting Physician (Dermatology) Teodora Medici, MD as Consulting Physician (Gynecology)  PLACE OF SERVICE:  [facility]  Advanced Directive information    Allergies  Allergen Reactions   Contrast Media [Iodinated Contrast Media] Hives    Hives during IVP at urology office '02, requires 13 hr prep///a.calhoun  Patient came thru ER and had a 1 hour pre-medication and did fine   Oxycontin [Oxycodone] Other (See Comments)    Decreased appetite.   Boniva [Ibandronic Acid] Nausea And Vomiting and Other (See Comments)    Weakness    Codeine Diarrhea and Nausea And Vomiting   Darvocet [Propoxyphene N-Acetaminophen] Diarrhea and Nausea And Vomiting   Erythromycin Nausea And Vomiting    Chief Complaint  Patient presents with   Medical Management of Chronic Issues    Medical Management of chronic issues. 4 Month follow up     HPI: Patient is a 87 y.o. female .  Adriana [name] is here today to talk about several issues.  She has recently made moved from independent living to assisted living and still things are not settled in are changing a lot for her.  She has been on citalopram for depression for some months now but feels like she is more depressed.  She has seen counselor as well.  Coincident with her depression she feels like her memory is not as good as it used to be.  We spent a good deal of time talking about memory and how it changes with age.  She has had 2 strokes in the past so she would be at risk for vascular dementia but in talking to her today I do not see any significant change in her conversation or responses to questions. In  addition she is having some visual difficulties.  Has not had eyes checked her glasses changed in the last 5 to 6 years.  Decreased vision is hampering her ability to read and organize her papers. He was recently hospitalized for 2 nights with some pulmonary emboli and is now on Eliquis.  He has had no further chest pain since that hospitalization  Review of Systems:  Review of Systems  Constitutional: Negative.   Eyes:  Positive for blurred vision.  Genitourinary:  Positive for frequency.  Musculoskeletal: Negative.   Neurological: Negative.   Psychiatric/Behavioral:  Positive for depression.   All other systems reviewed and are negative.   Past Medical History:  Diagnosis Date   Anginal pain (HCC) 08/24/2010   Echo-EF =>55%,LV normal   Arthritis    Cataract 12/2006   Both eyes  Epes MD   Chest pain, unspecified 09/04/2007   Lexiscan-- EF 72%; LV normal   Constipation    Takes flax seed and honey .  Smooth Move tea.   Depression    Diverticulitis    Head injury, closed    with fall   History of cardiac cath 2012   normal coronary arteries   Hypertension    Mitral valve prolapse    Osteopenia    Stroke (HCC) 09/02/2019   Past Surgical History:  Procedure Laterality Date   ANTERIOR CERVICAL DECOMP/DISCECTOMY FUSION N/A 10/23/2012   Procedure: Cervical Five to Cervical Six, Cervical Six to  Cervical Seven anterior cervical decompression with fusion plating and bonegraft;  Surgeon: Hewitt Shorts, MD;  Location: [facility];  Service: Neurosurgery;  Laterality: N/A;  ANTERIOR CERVICAL DECOMPRESSION/DISCECTOMY FUSION 2 LEVELS   BREAST CYST ASPIRATION  1990   benign   CARDIAC CATHETERIZATION  08/01/2010   normal coronaries   COLONOSCOPY  approx 2011   EYE SURGERY Bilateral 2011   intestinal blockage surgery  Feb. 2012   "kink in small intestine" per pt   Social History:   reports that she has never smoked. She has never used smokeless tobacco. She reports that she does  not currently use alcohol after a past usage of about 4.0 standard drinks of alcohol per week. She reports that she does not use drugs.  Family History  Problem Relation Age of Onset   Hypertension Mother    Transient ischemic attack Mother    Parkinson's disease Mother    Dementia Mother    CVA Mother    Diabetes Father    Coronary artery disease Father    Heart failure Father    Cancer Father        prostate   Depression Father    Congestive Heart Failure Father    Prostate cancer Brother    Diabetes Brother    Prostate cancer Paternal Grandmother     Medications: Patient's Medications  New Prescriptions   No medications on file  Previous Medications   ACETAMINOPHEN (TYLENOL) 325 MG TABLET    Take 325-650 mg by mouth See admin instructions. 2 entries: 650 mg at bedtime + 325 mg every 4 hours as needed for fever >100.23F, pain, headache.   ALENDRONATE (FOSAMAX) 70 MG TABLET    TAKE 1 TAB ONCE A WEEK, AT LEAST 30 MIN BEFORE 1ST FOOD.DO NOT LIE DOWN FOR 30 MIN AFTER TAKING.   AMLODIPINE (NORVASC) 5 MG TABLET    Take 5 mg by mouth in the morning.   APIXABAN (ELIQUIS) 5 MG TABS TABLET    Take 2 tablets (10 mg total) by mouth 2 (two) times daily for 7 days, THEN 1 tablet (5 mg total) 2 (two) times daily for 23 days.   ATORVASTATIN (LIPITOR) 20 MG TABLET    TAKE ONE TABLET BY MOUTH ONCE DAILY.   CALCIUM CARBONATE-VITAMIN D (CALCIUM 600 +D HIGH POTENCY) 600-10 MG-MCG TABS    Take 1 tablet by mouth in the morning.   CITALOPRAM (CELEXA) 20 MG TABLET    Take 1 tablet (20 mg total) by mouth daily.   CLOPIDOGREL (PLAVIX) 75 MG TABLET    TAKE 1 TABLET ONCE DAILY.   DOCUSATE SODIUM (COLACE) 100 MG CAPSULE    Take 100 mg by mouth at bedtime.   METOPROLOL TARTRATE (LOPRESSOR) 25 MG TABLET    Take 12.5 mg by mouth 2 (two) times daily.   PANTOPRAZOLE (PROTONIX) 40 MG TABLET    TAKE 1 TABLET ONCE DAILY.  Modified Medications   No medications on file  Discontinued Medications   No medications on  file    Physical Exam:  Vitals:   02/21/22 1338  BP: 128/82  Pulse: 60  Temp: (!) 96.7 F (35.9 C)  SpO2: 98%  Weight: 153 lb 12.8 oz (69.8 kg)  Height: 5\' 6"  (1.676 m)   Body mass index is 24.82 kg/m. Wt Readings from Last 3 Encounters:  02/21/22 153 lb 12.8 oz (69.8 kg)  01/29/22 148 lb (67.1 kg)  01/27/22 133 lb (60.3 kg)    Physical Exam Vitals reviewed.  Constitutional:      Appearance: Normal appearance.  Cardiovascular:     Rate and Rhythm: Normal rate and regular rhythm.  Pulmonary:     Effort: Pulmonary effort is normal.     Breath sounds: Normal breath sounds.  Musculoskeletal:        General: Normal range of motion.  Neurological:     General: No focal deficit present.     Mental Status: She is alert and oriented to person, place, and time.     Labs reviewed: Basic Metabolic Panel: Recent Labs    01/26/22 2223 01/28/22 0427  NA 138 140  K 3.3* 3.7  CL 104 108  CO2 25 24  GLUCOSE 125* 108*  BUN 13 14  CREATININE 0.86 0.81  CALCIUM 9.6 9.0   Liver Function Tests: No results for input(s): "AST", "ALT", "ALKPHOS", "BILITOT", "PROT", "ALBUMIN" in the last 8760 hours. No results for input(s): "LIPASE", "AMYLASE" in the last 8760 hours. No results for input(s): "AMMONIA" in the last 8760 hours. CBC: Recent Labs    10/19/21 0729 01/26/22 2223 01/28/22 0427  WBC 5.4 11.9* 11.4*  NEUTROABS 3,397  --   --   HGB 13.0 13.1 12.9  HCT 38.0 38.5 38.0  MCV 92.0 93.2 93.4  PLT 239 233 225   Lipid Panel: Recent Labs    10/19/21 0729  CHOL 115  HDL 43*  LDLCALC 58  TRIG 67  CHOLHDL 2.7   TSH: No results for input(s): "TSH" in the last 8760 hours. A1C: Lab Results  Component Value Date   HGBA1C 5.3 09/03/2019     Assessment/Plan  1. Vision loss Encouraged her to see her eye doctor.  I think she may be seeing optometrist but I would prefer ophthalmology consult and have put in an order for same  2. Acute pulmonary embolism without  acute cor pulmonale, unspecified pulmonary embolism type (HCC) Asymptomatic tach at present time continue with Eliquis.  She understands that Eliquis does not dissolve clots that have formed but is necessary to prevent future clots  3. Depression with anxiety Rather than try new medicine since she has been on citalopram for some time we will go with counseling.  If we decide to switch medicines would probably move to Lexapro which I think can be a lateral move without tapering citalopram.  I do think if she completes her move from independent living to assisted living this will improve  4. Gait abnormality She continues to use her motorized scooter to travel [place] distances but she is undergoing some physical therapy here to increase her strength.  She feels like she got weaker after just 2 days in the hospital last month.   Jacalyn Lefevre, MD [facility] [phone]

## 2022-02-22 DIAGNOSIS — M6281 Muscle weakness (generalized): Secondary | ICD-10-CM | POA: Diagnosis not present

## 2022-02-22 DIAGNOSIS — R2681 Unsteadiness on feet: Secondary | ICD-10-CM | POA: Diagnosis not present

## 2022-02-22 DIAGNOSIS — M5459 Other low back pain: Secondary | ICD-10-CM | POA: Diagnosis not present

## 2022-02-22 DIAGNOSIS — R29898 Other symptoms and signs involving the musculoskeletal system: Secondary | ICD-10-CM | POA: Diagnosis not present

## 2022-02-22 DIAGNOSIS — R293 Abnormal posture: Secondary | ICD-10-CM | POA: Diagnosis not present

## 2022-02-23 DIAGNOSIS — F411 Generalized anxiety disorder: Secondary | ICD-10-CM | POA: Diagnosis not present

## 2022-02-23 DIAGNOSIS — F330 Major depressive disorder, recurrent, mild: Secondary | ICD-10-CM | POA: Diagnosis not present

## 2022-02-27 DIAGNOSIS — M5459 Other low back pain: Secondary | ICD-10-CM | POA: Diagnosis not present

## 2022-02-27 DIAGNOSIS — R29898 Other symptoms and signs involving the musculoskeletal system: Secondary | ICD-10-CM | POA: Diagnosis not present

## 2022-02-27 DIAGNOSIS — R293 Abnormal posture: Secondary | ICD-10-CM | POA: Diagnosis not present

## 2022-02-27 DIAGNOSIS — M6281 Muscle weakness (generalized): Secondary | ICD-10-CM | POA: Diagnosis not present

## 2022-02-27 DIAGNOSIS — R2681 Unsteadiness on feet: Secondary | ICD-10-CM | POA: Diagnosis not present

## 2022-02-28 DIAGNOSIS — M6281 Muscle weakness (generalized): Secondary | ICD-10-CM | POA: Diagnosis not present

## 2022-02-28 DIAGNOSIS — R293 Abnormal posture: Secondary | ICD-10-CM | POA: Diagnosis not present

## 2022-02-28 DIAGNOSIS — M5459 Other low back pain: Secondary | ICD-10-CM | POA: Diagnosis not present

## 2022-02-28 DIAGNOSIS — R2681 Unsteadiness on feet: Secondary | ICD-10-CM | POA: Diagnosis not present

## 2022-02-28 DIAGNOSIS — R29898 Other symptoms and signs involving the musculoskeletal system: Secondary | ICD-10-CM | POA: Diagnosis not present

## 2022-03-01 DIAGNOSIS — R2681 Unsteadiness on feet: Secondary | ICD-10-CM | POA: Diagnosis not present

## 2022-03-01 DIAGNOSIS — M5459 Other low back pain: Secondary | ICD-10-CM | POA: Diagnosis not present

## 2022-03-01 DIAGNOSIS — R29898 Other symptoms and signs involving the musculoskeletal system: Secondary | ICD-10-CM | POA: Diagnosis not present

## 2022-03-01 DIAGNOSIS — M6281 Muscle weakness (generalized): Secondary | ICD-10-CM | POA: Diagnosis not present

## 2022-03-01 DIAGNOSIS — R293 Abnormal posture: Secondary | ICD-10-CM | POA: Diagnosis not present

## 2022-03-02 DIAGNOSIS — R2681 Unsteadiness on feet: Secondary | ICD-10-CM | POA: Diagnosis not present

## 2022-03-02 DIAGNOSIS — R29898 Other symptoms and signs involving the musculoskeletal system: Secondary | ICD-10-CM | POA: Diagnosis not present

## 2022-03-02 DIAGNOSIS — R293 Abnormal posture: Secondary | ICD-10-CM | POA: Diagnosis not present

## 2022-03-02 DIAGNOSIS — M6281 Muscle weakness (generalized): Secondary | ICD-10-CM | POA: Diagnosis not present

## 2022-03-02 DIAGNOSIS — M5459 Other low back pain: Secondary | ICD-10-CM | POA: Diagnosis not present

## 2022-03-06 DIAGNOSIS — R29898 Other symptoms and signs involving the musculoskeletal system: Secondary | ICD-10-CM | POA: Diagnosis not present

## 2022-03-06 DIAGNOSIS — R293 Abnormal posture: Secondary | ICD-10-CM | POA: Diagnosis not present

## 2022-03-06 DIAGNOSIS — R2681 Unsteadiness on feet: Secondary | ICD-10-CM | POA: Diagnosis not present

## 2022-03-06 DIAGNOSIS — M6281 Muscle weakness (generalized): Secondary | ICD-10-CM | POA: Diagnosis not present

## 2022-03-06 DIAGNOSIS — M5459 Other low back pain: Secondary | ICD-10-CM | POA: Diagnosis not present

## 2022-03-07 DIAGNOSIS — R2681 Unsteadiness on feet: Secondary | ICD-10-CM | POA: Diagnosis not present

## 2022-03-07 DIAGNOSIS — R29898 Other symptoms and signs involving the musculoskeletal system: Secondary | ICD-10-CM | POA: Diagnosis not present

## 2022-03-07 DIAGNOSIS — M5459 Other low back pain: Secondary | ICD-10-CM | POA: Diagnosis not present

## 2022-03-07 DIAGNOSIS — R293 Abnormal posture: Secondary | ICD-10-CM | POA: Diagnosis not present

## 2022-03-07 DIAGNOSIS — M6281 Muscle weakness (generalized): Secondary | ICD-10-CM | POA: Diagnosis not present

## 2022-03-09 DIAGNOSIS — M6281 Muscle weakness (generalized): Secondary | ICD-10-CM | POA: Diagnosis not present

## 2022-03-09 DIAGNOSIS — R29898 Other symptoms and signs involving the musculoskeletal system: Secondary | ICD-10-CM | POA: Diagnosis not present

## 2022-03-09 DIAGNOSIS — R293 Abnormal posture: Secondary | ICD-10-CM | POA: Diagnosis not present

## 2022-03-09 DIAGNOSIS — R2681 Unsteadiness on feet: Secondary | ICD-10-CM | POA: Diagnosis not present

## 2022-03-09 DIAGNOSIS — M5459 Other low back pain: Secondary | ICD-10-CM | POA: Diagnosis not present

## 2022-03-12 DIAGNOSIS — F330 Major depressive disorder, recurrent, mild: Secondary | ICD-10-CM | POA: Diagnosis not present

## 2022-03-13 ENCOUNTER — Non-Acute Institutional Stay: Payer: Medicare PPO | Admitting: Nurse Practitioner

## 2022-03-13 ENCOUNTER — Encounter: Payer: Self-pay | Admitting: Nurse Practitioner

## 2022-03-13 DIAGNOSIS — M810 Age-related osteoporosis without current pathological fracture: Secondary | ICD-10-CM | POA: Diagnosis not present

## 2022-03-13 DIAGNOSIS — E785 Hyperlipidemia, unspecified: Secondary | ICD-10-CM

## 2022-03-13 DIAGNOSIS — I639 Cerebral infarction, unspecified: Secondary | ICD-10-CM | POA: Diagnosis not present

## 2022-03-13 DIAGNOSIS — R2681 Unsteadiness on feet: Secondary | ICD-10-CM | POA: Diagnosis not present

## 2022-03-13 DIAGNOSIS — F418 Other specified anxiety disorders: Secondary | ICD-10-CM

## 2022-03-13 DIAGNOSIS — K219 Gastro-esophageal reflux disease without esophagitis: Secondary | ICD-10-CM

## 2022-03-13 DIAGNOSIS — I10 Essential (primary) hypertension: Secondary | ICD-10-CM

## 2022-03-13 DIAGNOSIS — R29898 Other symptoms and signs involving the musculoskeletal system: Secondary | ICD-10-CM | POA: Diagnosis not present

## 2022-03-13 DIAGNOSIS — M6281 Muscle weakness (generalized): Secondary | ICD-10-CM | POA: Diagnosis not present

## 2022-03-13 DIAGNOSIS — R269 Unspecified abnormalities of gait and mobility: Secondary | ICD-10-CM | POA: Diagnosis not present

## 2022-03-13 DIAGNOSIS — I2699 Other pulmonary embolism without acute cor pulmonale: Secondary | ICD-10-CM

## 2022-03-13 DIAGNOSIS — M5459 Other low back pain: Secondary | ICD-10-CM | POA: Diagnosis not present

## 2022-03-13 DIAGNOSIS — K5901 Slow transit constipation: Secondary | ICD-10-CM | POA: Diagnosis not present

## 2022-03-13 DIAGNOSIS — R293 Abnormal posture: Secondary | ICD-10-CM | POA: Diagnosis not present

## 2022-03-13 DIAGNOSIS — R053 Chronic cough: Secondary | ICD-10-CM

## 2022-03-13 DIAGNOSIS — H547 Unspecified visual loss: Secondary | ICD-10-CM | POA: Insufficient documentation

## 2022-03-13 NOTE — Assessment & Plan Note (Signed)
takes Fosamax, Ca, Vit D, t score -2.3 10/25/20

## 2022-03-13 NOTE — Assessment & Plan Note (Signed)
takes Pantoprazole, bloated at times. Hgb 12.9 01/28/22

## 2022-03-13 NOTE — Assessment & Plan Note (Signed)
takes Citalopram

## 2022-03-13 NOTE — Assessment & Plan Note (Signed)
Blood pressure is controlled, takes Amlodipine, Metoprolol. Bun/creat 14/0.81 01/28/22

## 2022-03-13 NOTE — Assessment & Plan Note (Signed)
CTA 01/27/22 showed multiple subsegmental pulmonary emboli and pericardial effusion identified, on Eliquis, needs f/u Cardiology. EF 65-70% 01/27/22. Venous US BLE no DVT.

## 2022-03-13 NOTE — Assessment & Plan Note (Signed)
takes Atorvastatin, LDL 58 10/19/21

## 2022-03-13 NOTE — Assessment & Plan Note (Signed)
falls, uses walker, power w/c

## 2022-03-13 NOTE — Assessment & Plan Note (Signed)
Chronic occasional dry cough for about a year, left side throat tickles sometimes during eating, then cough, ? Resultant of CVA. . ST: no apparent swallowing problem.

## 2022-03-13 NOTE — Progress Notes (Signed)
Location:  [place] Room Number: AL/904/A Place of Service:  ALF (13) Provider:  Rethel Sebek X, NP Wardell Honour, MD  Patient Care Team: Wardell Honour, MD as PCP - General (Family Medicine) Buford Dresser, MD as PCP - Cardiology (Cardiology) Jamichael Knotts X, NP as Nurse Practitioner (Internal Medicine) Keene Breath., MD (Ophthalmology) Danella Sensing, MD as Consulting Physician (Dermatology) Danella Sensing, MD as Consulting Physician (Dermatology) Delila Pereyra, MD as Consulting Physician (Gynecology)  Extended Emergency Contact Information Primary Emergency Contact: Gaetana Michaelis [place] Phone: [phone] Relation: Daughter Secondary Emergency Contact: Black,Joel [place] Phone: [phone] Relation: Son  Code Status:  FULL Goals of care: Advanced Directive information    03/13/2022    9:11 AM  Advanced Directives  Does Patient Have a Medical Advance Directive? Yes  Type of Advance Directive Living will  Does patient want to make changes to medical advance directive? No - Patient declined  Would patient like information on creating a medical advance directive? No - Patient declined     Chief Complaint  Patient presents with   Medical Management of Chronic Issues    Patient is here for a follow up for chronic conditions    Quality Metric Gaps    Patient is due for AWV   Immunizations    Patient is due to TDap    HPI:  Pt is a 88 y.o. female seen today for medical management of chronic diseases.    CTA 01/27/22 showed multiple subsegmental pulmonary emboli and pericardial effusion identified, on Eliquis, needs f/u Cardiology. EF 65-70% 01/27/22. Venous US BLE no DVT.              HTN, takes Amlodipine, Metoprolol. Bun/creat 14/0.81 01/28/22             Constipation, takes Colace qd.                                   GERD, takes Pantoprazole, bloated at times. 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 Takes Plavix. Atorvastatin. affected gait and vision issues, feels weak left leg.              Gait abnormality, falls, uses walker, power w/c             Chronic occasional dry cough for about a year, left side throat tickles sometimes during eating, then cough, ? Resultant of CVA. . ST: no apparent swallowing problem.              OP, takes Fosamax, Ca, Vit D, t score -2.3 10/25/20             Depression, takes Citalopram             Pericardial effusion, Hx of it, present again on CTA 01/27/22             Hyperlipidemia, takes Atorvastatin, LDL 58 10/19/21             OA pain, Using walker/pressure             Urinary incontinence, progressing     Past Medical History:  Diagnosis Date   Anginal pain ([place]) 08/24/2010   Echo-EF =>55%,LV normal   Arthritis    Cataract 12/2006  Both eyes  Epes MD   Chest pain, unspecified 09/04/2007   Lexiscan-- EF 72%; LV normal   Constipation    Takes flax seed and honey .  Smooth Move tea.   Depression    Diverticulitis    Head injury, closed    with fall   History of cardiac cath 2012   normal coronary arteries   Hypertension    Mitral valve prolapse    Osteopenia    Stroke ([place]) 09/02/2019   Past Surgical History:  Procedure Laterality Date   ANTERIOR CERVICAL DECOMP/DISCECTOMY FUSION N/A 10/23/2012   Procedure: Cervical Five to Cervical Six, Cervical Six to Cervical Seven anterior cervical decompression with fusion plating and bonegraft;  Surgeon: Hosie Spangle, MD;  Location: [facility];  Service: Neurosurgery;  Laterality: N/A;  ANTERIOR CERVICAL DECOMPRESSION/DISCECTOMY FUSION 2 LEVELS   BREAST CYST ASPIRATION  1990   benign   CARDIAC CATHETERIZATION  08/01/2010   normal coronaries   COLONOSCOPY  approx 2011   EYE SURGERY Bilateral 2011   intestinal blockage surgery  Feb. 2012   "kink in small intestine" per pt     Allergies  Allergen Reactions   Contrast Media [Iodinated Contrast Media] Hives    Hives during IVP at urology office '02, requires 13 hr prep///a.calhoun  Patient came thru ER and had a 1 hour pre-medication and did fine   Oxycontin [Oxycodone] Other (See Comments)    Decreased appetite.   Boniva [Ibandronic Acid] Nausea And Vomiting and Other (See Comments)    Weakness    Codeine Diarrhea and Nausea And Vomiting   Darvocet [Propoxyphene N-Acetaminophen] Diarrhea and Nausea And Vomiting   Erythromycin Nausea And Vomiting    Outpatient Encounter Medications as of 03/13/2022  Medication Sig   acetaminophen (TYLENOL) 325 MG tablet Take 325-650 mg by mouth See admin instructions. 2 entries: 650 mg at bedtime + 325 mg every 4 hours as needed for fever >100.13F, pain, headache.   alendronate (FOSAMAX) 70 MG tablet TAKE 1 TAB ONCE A WEEK, AT LEAST 30 MIN BEFORE 1ST FOOD.DO NOT LIE DOWN FOR 30 MIN AFTER TAKING.   amLODipine (NORVASC) 5 MG tablet Take 5 mg by mouth in the morning.   atorvastatin (LIPITOR) 20 MG tablet TAKE ONE TABLET BY MOUTH ONCE DAILY.   Calcium Carbonate-Vitamin D (CALCIUM 600 +D HIGH POTENCY) 600-10 MG-MCG TABS Take 1 tablet by mouth in the morning.   citalopram (CELEXA) 20 MG tablet Take 1 tablet (20 mg total) by mouth daily.   clopidogrel (PLAVIX) 75 MG tablet TAKE 1 TABLET ONCE DAILY.   docusate sodium (COLACE) 100 MG capsule Take 100 mg by mouth at bedtime.   metoprolol tartrate (LOPRESSOR) 25 MG tablet Take 12.5 mg by mouth 2 (two) times daily.   pantoprazole (PROTONIX) 40 MG tablet TAKE 1 TABLET ONCE DAILY.   apixaban (ELIQUIS) 5 MG TABS tablet Take 2 tablets (10 mg total) by mouth 2 (two) times daily for 7 days, THEN 1 tablet (5 mg total) 2 (two) times daily for 23 days.   No facility-administered encounter medications on file as of 03/13/2022.    Review of Systems  Constitutional:  Negative for activity change, appetite change and fever.  HENT:  Positive for  hearing loss. Negative for congestion and trouble swallowing.   Eyes:  Negative for visual disturbance.  Respiratory:  Positive for cough. Negative for chest tightness, shortness of breath and wheezing.        Chronic occasional cough with clear phlegm.  DOE since 01/27/22  Cardiovascular:  Negative for chest pain, palpitations and leg swelling.  Gastrointestinal:  Negative for abdominal pain and constipation.  Genitourinary:  Negative for dysuria, frequency and urgency.  Musculoskeletal:  Positive for arthralgias, back pain and gait problem.  Skin:  Negative for color change.  Neurological:  Positive for weakness. Negative for speech difficulty and headaches.       Memory lapses occasionally. Left sided weakness.   Psychiatric/Behavioral:  Negative for behavioral problems and sleep disturbance. The patient is not nervous/anxious.     Immunization History  Administered Date(s) Administered   Fluad Quad(high Dose 65+) 12/17/2018   Influenza, High Dose Seasonal PF 12/08/2016, 12/19/2020   Influenza-Unspecified 12/16/2019, 12/25/2021   Moderna Sars-Covid-2 Vaccination 03/09/2019, 04/06/2019, 01/12/2020   Pfizer Covid-19 Vaccine Bivalent Booster 42yrs & up 01/04/2022   Pneumococcal Conjugate-13 06/12/2013   Pneumococcal Polysaccharide-23 07/28/2002   Tdap 07/27/2009   Zoster Recombinat (Shingrix) 01/08/2017, 06/03/2017   Pertinent  Health Maintenance Due  Topic Date Due   INFLUENZA VACCINE  Completed   DEXA SCAN  Completed      11/01/2021    1:38 PM 01/26/2022   10:08 PM 01/27/2022   10:25 AM 01/28/2022    8:15 AM 03/13/2022    9:11 AM  Fall Risk  Falls in the past year? 1    0  Was there an injury with Fall? 0    0  Fall Risk Category Calculator 1    0  Fall Risk Category Low    Low  Patient Fall Risk Level Low fall risk Low fall risk Low fall risk Moderate fall risk Low fall risk  Patient at Risk for Falls Due to History of fall(s)    No Fall Risks  Fall risk Follow up      Falls evaluation completed   Functional Status Survey:    Vitals:   03/13/22 0913  BP: 122/68  Pulse: 60  Resp: 18  Temp: (!) 97.2 F (36.2 C)  SpO2: 97%  Weight: 152 lb 6.4 oz (69.1 kg)  Height: 5\' 6"  (1.676 m)   Body mass index is 24.6 kg/m. Physical Exam Vitals and nursing note reviewed.  Constitutional:      Appearance: Normal appearance.  HENT:     Head: Normocephalic and atraumatic.     Mouth/Throat:     Mouth: Mucous membranes are moist.  Eyes:     Extraocular Movements: Extraocular movements intact.     Conjunctiva/sclera: Conjunctivae normal.     Pupils: Pupils are equal, round, and reactive to light.     Comments: Lateral right eye peripheral vision field loss since CVA 11/2018  Cardiovascular:     Rate and Rhythm: Normal rate and regular rhythm.     Heart sounds: No murmur heard. Pulmonary:     Effort: Pulmonary effort is normal.     Breath sounds: No rales.  Abdominal:     General: Bowel sounds are normal.     Palpations: Abdomen is soft.     Tenderness: There is no abdominal tenderness.  Musculoskeletal:     Cervical back: Normal range of motion and neck supple.     Right lower leg: No edema.     Left lower leg: No edema.     Comments: Kyphoscoliosis. Left shoulder over head ROM decreased.   Skin:    General: Skin is warm and dry.  Neurological:     General: No focal deficit present.     Mental Status: She is alert  and oriented to person, place, and time. Mental status is at baseline.     Cranial Nerves: No cranial nerve deficit.     Motor: Weakness present.     Coordination: Coordination normal.     Gait: Gait abnormal.     Deep Tendon Reflexes: Reflexes normal.     Comments: Walker for ambulation. Left sided weakness with muscle strength 5/5.  Psychiatric:        Mood and Affect: Mood normal.        Behavior: Behavior normal.     Labs reviewed: Recent Labs    01/26/22 2223 01/28/22 0427  NA 138 140  K 3.3* 3.7  CL 104 108  CO2 25 24   GLUCOSE 125* 108*  BUN 13 14  CREATININE 0.86 0.81  CALCIUM 9.6 9.0   No results for input(s): "AST", "ALT", "ALKPHOS", "BILITOT", "PROT", "ALBUMIN" in the last 8760 hours. Recent Labs    10/19/21 0729 01/26/22 2223 01/28/22 0427  WBC 5.4 11.9* 11.4*  NEUTROABS 3,397  --   --   HGB 13.0 13.1 12.9  HCT 38.0 38.5 38.0  MCV 92.0 93.2 93.4  PLT 239 233 225   Lab Results  Component Value Date   TSH 4.05 06/30/2019   Lab Results  Component Value Date   HGBA1C 5.3 09/03/2019   Lab Results  Component Value Date   CHOL 115 10/19/2021   HDL 43 (L) 10/19/2021   LDLCALC 58 10/19/2021   TRIG 67 10/19/2021   CHOLHDL 2.7 10/19/2021    Significant Diagnostic Results in last 30 days:  No results found.  Assessment/Plan Hypertension Blood pressure is controlled, takes Amlodipine, Metoprolol. Bun/creat 14/0.81 01/28/22  Slow transit constipation Stable, takes Colace qd.    GERD (gastroesophageal reflux disease)  takes Pantoprazole, bloated at times. Hgb 12.9 01/28/22  Ischemic stroke [facility])  CVA 2nd to thrombosis of right posterior cerebral artery(hospitalized 6/30-7/3), Hx of CVA left occipital cortex 9/20. Takes Plavix. Atorvastatin. affected gait and vision issues, feels weak left leg.   Gait abnormality falls, uses walker, power w/c  Cough Chronic occasional dry cough for about a year, left side throat tickles sometimes during eating, then cough, ? Resultant of CVA. . ST: no apparent swallowing problem.   Osteoporosis  takes Fosamax, Ca, Vit D, t score -2.3 10/25/20  Depression with anxiety takes Citalopram  Hyperlipidemia takes Atorvastatin, LDL 58 10/19/21  Pulmonary embolus ([place]) CTA 01/27/22 showed multiple subsegmental pulmonary emboli and pericardial effusion identified, on Eliquis, needs f/u Cardiology. EF 65-70% 01/27/22. Venous US BLE no DVT.      Family/ staff Communication: plan of care reviewed with the patient and charge nurse.   Labs/tests  ordered:  none  Time spend 40 minutes.

## 2022-03-13 NOTE — Assessment & Plan Note (Signed)
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 Takes Plavix. Atorvastatin. affected gait and vision issues, feels weak left leg.

## 2022-03-13 NOTE — Assessment & Plan Note (Signed)
Stable, takes Colace qd 

## 2022-03-13 NOTE — Assessment & Plan Note (Signed)
Pending ophthalmology appt 03/20/22

## 2022-03-14 DIAGNOSIS — R293 Abnormal posture: Secondary | ICD-10-CM | POA: Diagnosis not present

## 2022-03-14 DIAGNOSIS — M5459 Other low back pain: Secondary | ICD-10-CM | POA: Diagnosis not present

## 2022-03-14 DIAGNOSIS — R29898 Other symptoms and signs involving the musculoskeletal system: Secondary | ICD-10-CM | POA: Diagnosis not present

## 2022-03-14 DIAGNOSIS — M6281 Muscle weakness (generalized): Secondary | ICD-10-CM | POA: Diagnosis not present

## 2022-03-14 DIAGNOSIS — R2681 Unsteadiness on feet: Secondary | ICD-10-CM | POA: Diagnosis not present

## 2022-03-15 DIAGNOSIS — M5459 Other low back pain: Secondary | ICD-10-CM | POA: Diagnosis not present

## 2022-03-15 DIAGNOSIS — R293 Abnormal posture: Secondary | ICD-10-CM | POA: Diagnosis not present

## 2022-03-15 DIAGNOSIS — R2681 Unsteadiness on feet: Secondary | ICD-10-CM | POA: Diagnosis not present

## 2022-03-15 DIAGNOSIS — M6281 Muscle weakness (generalized): Secondary | ICD-10-CM | POA: Diagnosis not present

## 2022-03-15 DIAGNOSIS — R29898 Other symptoms and signs involving the musculoskeletal system: Secondary | ICD-10-CM | POA: Diagnosis not present

## 2022-03-16 ENCOUNTER — Non-Acute Institutional Stay: Payer: Medicare PPO | Admitting: Nurse Practitioner

## 2022-03-16 ENCOUNTER — Encounter: Payer: Self-pay | Admitting: Nurse Practitioner

## 2022-03-16 DIAGNOSIS — Z0000 Encounter for general adult medical examination without abnormal findings: Secondary | ICD-10-CM | POA: Diagnosis not present

## 2022-03-19 ENCOUNTER — Encounter: Payer: Self-pay | Admitting: Nurse Practitioner

## 2022-03-19 NOTE — Progress Notes (Addendum)
Subjective:   Adriana [name] is a 88 y.o. female who presents for Medicare Annual (Subsequent) preventive examination @ [facility] [address]. Cardiac Risk Factors include: advanced age (>62men, >28 women);dyslipidemia;hypertension;sedentary lifestyle     Objective:    Today's Vitals   03/16/22 1404  BP: 122/68  Pulse: 60  Resp: 18  Temp: (!) 97.2 F (36.2 C)  SpO2: 97%  Weight: 152 lb 6.4 oz (69.1 kg)  Height: 5\' 6"  (1.676 m)   Body mass index is 24.6 kg/m.     03/16/2022    2:03 PM 03/13/2022    9:11 AM 01/26/2022   10:08 PM 07/21/2020    3:28 PM 07/06/2020    1:37 PM 04/05/2020    9:37 AM 11/05/2019    9:08 AM  Advanced Directives  Does Patient Have a Medical Advance Directive? Yes Yes No Yes Yes Yes Yes  Type of Advance Directive Living will Living will  Living will;Healthcare Power of Attorney Living will;Healthcare Power of 01/05/2020 Power of [facility];Living will Living will  Does patient want to make changes to medical advance directive? No - Patient declined No - Patient declined  No - Patient declined No - Patient declined No - Patient declined No - Patient declined  Copy of Healthcare Power of Attorney in Chart?    Yes - validated most recent copy scanned in chart (See row information) Yes - validated most recent copy scanned in chart (See row information) Yes - validated most recent copy scanned in chart (See row information)   Would patient like information on creating a medical advance directive? No - Patient declined No - Patient declined         Current Medications (verified) Outpatient Encounter Medications as of 03/16/2022  Medication Sig   acetaminophen (TYLENOL) 325 MG tablet Take 325-650 mg by mouth See admin instructions. 2 entries: 650 mg at bedtime + 325 mg every 4 hours as needed for fever >100.36F, pain, headache.   alendronate (FOSAMAX) 70 MG tablet TAKE 1 TAB ONCE A WEEK, AT LEAST 30 MIN BEFORE 1ST FOOD.DO NOT LIE DOWN FOR 30 MIN AFTER TAKING.    amLODipine (NORVASC) 5 MG tablet Take 5 mg by mouth in the morning.   atorvastatin (LIPITOR) 20 MG tablet TAKE ONE TABLET BY MOUTH ONCE DAILY.   Calcium Carbonate-Vitamin D (CALCIUM 600 +D HIGH POTENCY) 600-10 MG-MCG TABS Take 1 tablet by mouth in the morning.   citalopram (CELEXA) 20 MG tablet Take 1 tablet (20 mg total) by mouth daily.   clopidogrel (PLAVIX) 75 MG tablet TAKE 1 TABLET ONCE DAILY.   docusate sodium (COLACE) 100 MG capsule Take 100 mg by mouth at bedtime.   metoprolol tartrate (LOPRESSOR) 25 MG tablet Take 12.5 mg by mouth 2 (two) times daily.   pantoprazole (PROTONIX) 40 MG tablet TAKE 1 TABLET ONCE DAILY.   apixaban (ELIQUIS) 5 MG TABS tablet Take 2 tablets (10 mg total) by mouth 2 (two) times daily for 7 days, THEN 1 tablet (5 mg total) 2 (two) times daily for 23 days.   No facility-administered encounter medications on file as of 03/16/2022.    Allergies (verified) Contrast media [iodinated contrast media], Oxycontin [oxycodone], Boniva [ibandronic acid], Codeine, Darvocet [propoxyphene n-acetaminophen], and Erythromycin   History: Past Medical History:  Diagnosis Date   Anginal pain (HCC) 08/24/2010   Echo-EF =>55%,LV normal   Arthritis    Cataract 12/2006   Both eyes  Epes MD   Chest pain, unspecified 09/04/2007  Lexiscan-- EF 72%; LV normal   Constipation    Takes flax seed and honey .  Smooth Move tea.   Depression    Diverticulitis    Head injury, closed    with fall   History of cardiac cath 2012   normal coronary arteries   Hypertension    Mitral valve prolapse    Osteopenia    Stroke (HCC) 09/02/2019   Past Surgical History:  Procedure Laterality Date   ANTERIOR CERVICAL DECOMP/DISCECTOMY FUSION N/A 10/23/2012   Procedure: Cervical Five to Cervical Six, Cervical Six to Cervical Seven anterior cervical decompression with fusion plating and bonegraft;  Surgeon: Hewitt Shorts, MD;  Location: [facility];  Service: Neurosurgery;  Laterality:  N/A;  ANTERIOR CERVICAL DECOMPRESSION/DISCECTOMY FUSION 2 LEVELS   BREAST CYST ASPIRATION  1990   benign   CARDIAC CATHETERIZATION  08/01/2010   normal coronaries   COLONOSCOPY  approx 2011   EYE SURGERY Bilateral 2011   intestinal blockage surgery  Feb. 2012   "kink in small intestine" per pt   Family History  Problem Relation Age of Onset   Hypertension Mother    Transient ischemic attack Mother    Parkinson's disease Mother    Dementia Mother    CVA Mother    Diabetes Father    Coronary artery disease Father    Heart failure Father    Cancer Father        prostate   Depression Father    Congestive Heart Failure Father    Prostate cancer Brother    Diabetes Brother    Prostate cancer Paternal Grandmother    Social History   Socioeconomic History   Marital status: Widowed    Spouse name: Joe   Number of children: 2   Years of education: college   Highest education level: Bachelor's degree (e.g., BA, AB, BS)  Occupational History    Comment: retired Runner, broadcasting/film/video  Tobacco Use   Smoking status: Never   Smokeless tobacco: Never  Vaping Use   Vaping Use: Never used  Substance and Sexual Activity   Alcohol use: Not Currently    Alcohol/week: 4.0 standard drinks of alcohol    Types: 2 Glasses of wine, 2 Cans of beer per week    Comment: occasionally   Drug use: No   Sexual activity: Yes  Other Topics Concern   Not on file  Social History Narrative   Diet:  Normal   Do you drink/eat things with caffeine?   Yes   Marital status:  Widow                            What year were you married? 1986 and 1960   Do you live in a house, apartment, assisted living, condo, trailer, etc)?  Apartment at [facility], independent living   Is it one or more stories? 1+   How many persons live in your home?  1   Do you have any pets in your home?  No   Current or past profession:  Runner, broadcasting/film/video, Environmental health practitioner at [employer] you exercise?   Yes                                                  Type & how often: Stretching,  Strengthening   Do you have a living will?  Yes   Do you have a DNR Form?   Do you have a POA/HPOA forms?  HPOA   Social Determinants of Health   Financial Resource Strain: Low Risk  (09/03/2017)   Overall Financial Resource Strain (CARDIA)    Difficulty of Paying Living Expenses: Not hard at all  Food Insecurity: No Food Insecurity (09/03/2017)   Hunger Vital Sign    Worried About Running Out of Food in the Last Year: Never true    Ran Out of Food in the Last Year: Never true  Transportation Needs: No Transportation Needs (09/03/2017)   PRAPARE - Administrator, Civil ServiceTransportation    Lack of Transportation (Medical): No    Lack of Transportation (Non-Medical): No  Physical Activity: Sufficiently Active (09/03/2017)   Exercise Vital Sign    Days of Exercise per Week: 7 days    Minutes of Exercise per Session: 40 min  Stress: Stress Concern Present (09/03/2017)   [employer] - Occupational Stress Questionnaire    Feeling of Stress : To some extent  Social Connections: Somewhat Isolated (09/03/2017)   Social Connection and Isolation Panel [NHANES]    Frequency of Communication with Friends and Family: More than three times a week    Frequency of Social Gatherings with Friends and Family: More than three times a week    Attends Religious Services: Never    Database administratorActive Member of Clubs or Organizations: No    Attends Engineer, structuralClub or Organization Meetings: Never    Marital Status: Married    Tobacco Counseling Counseling given: Not Answered   Clinical Intake:  Pre-visit preparation completed: Yes  Pain : No/denies pain     BMI - recorded: 24.6 Nutritional Status: BMI of 19-24  Normal Nutritional Risks: None Diabetes: No  How often do you need to have someone help you when you read instructions, pamphlets, or other written materials from your doctor or pharmacy?: 2 - Rarely What is the last grade level you completed in school?:  college  Diabetic?no  Interpreter Needed?: No  Information entered by :: Abri Vacca Nedra HaiXie Lamona Eimer NP   Activities of Daily Living    03/19/2022   11:59 AM  In your present state of health, do you have any difficulty performing the following activities:  Hearing? 1  Comment hearing aids  Vision? 1  Comment Ophthalmology 03/20/22  Difficulty concentrating or making decisions? 0  Walking or climbing stairs? 1  Dressing or bathing? 1  Doing errands, shopping? 1  Preparing Food and eating ? N  Using the Toilet? N  In the past six months, have you accidently leaked urine? Y  Do you have problems with loss of bowel control? N  Managing your Medications? Y  Managing your Finances? Y  Housekeeping or managing your Housekeeping? Y    Patient Care Team: Frederica KusterMiller, Stephen M, MD as PCP - General (Family Medicine) Jodelle Redhristopher, Bridgette, MD as PCP - Cardiology (Cardiology) Devin Ganaway X, NP as Nurse Practitioner (Internal Medicine) Marzella SchleinWood, Craig H., MD (Ophthalmology) Arminda ResidesJones, Daniel, MD as Consulting Physician (Dermatology) Arminda ResidesJones, Daniel, MD as Consulting Physician (Dermatology) Chevis PrettyMezer, Dimas AguasHoward, MD as Consulting Physician (Gynecology)  Indicate any recent Medical Services you may have received from other than [facility] providers in the past year (date may be approximate).     Assessment:   This is a routine wellness examination for Erskine SquibbJane.  Hearing/Vision screen No results found.  Dietary issues and exercise activities discussed: Current Exercise Habits: The patient  does not participate in regular exercise at present, Exercise limited by: cardiac condition(s);neurologic condition(s);orthopedic condition(s)   Goals Addressed             This Visit's Progress    Maintain Mobility and Function       Evidence-based guidance:  Acknowledge and validate impact of pain, loss of strength and potential disfigurement (hand osteoarthritis) on mental health and daily life, such as social isolation, anxiety,  depression, impaired sexual relationship and   injury from falls.  Anticipate referral to physical or occupational therapy for assessment, therapeutic exercise and recommendation for adaptive equipment or assistive devices; encourage participation.  Assess impact on ability to perform activities of daily living, as well as engage in sports and leisure events or requirements of work or school.  Provide anticipatory guidance and reassurance about the benefit of exercise to maintain function; acknowledge and normalize fear that exercise may worsen symptoms.  Encourage regular exercise, at least 10 minutes at a time for 45 minutes per week; consider yoga, water exercise and proprioceptive exercises; encourage use of wearable activity tracker to increase motivation and adherence.  Encourage maintenance or resumption of daily activities, including employment, as pain allows and with minimal exposure to trauma.  Assist patient to advocate for adaptations to the work environment.  Consider level of pain and function, gender, age, lifestyle, patient preference, quality of life, readiness and ?ocapacity to benefit? when recommending patients for orthopaedic surgery consultation.  Explore strategies, such as changes to medication regimen or activity that enables patient to anticipate and manage flare-ups that increase deconditioning and disability.  Explore patient preferences; encourage exposure to a broader range of activities that have been avoided for fear of experiencing pain.  Identify barriers to participation in therapy or exercise, such as pain with activity, anticipated or imagined pain.  Monitor postoperative joint replacement or any preexisting joint replacement for ongoing pain and loss of function; provide social support and encouragement throughout recovery.   Notes:        Depression Screen    04/05/2020    9:31 AM 04/28/2019    9:11 AM 09/03/2017   12:37 PM  PHQ 2/9 Scores  PHQ - 2 Score  2 0 4  PHQ- 9 Score 5  9    Fall Risk    03/16/2022    2:03 PM 03/13/2022    9:11 AM 11/01/2021    1:38 PM 09/27/2021    2:10 PM 05/17/2021    2:10 PM  Fall Risk   Falls in the past year? 0 0 1 0 0  Number falls in past yr: 0 0 0 0 0  Injury with Fall? 0 0 0 0 0  Risk for fall due to : No Fall Risks No Fall Risks History of fall(s) History of fall(s) History of fall(s)  Follow up Falls evaluation completed Falls evaluation completed   Falls evaluation completed    [place]:  Any stairs in or around the home? Yes  If so, are there any without handrails? No  Home free of loose throw rugs in walkways, pet beds, electrical cords, etc? Yes  Adequate lighting in your home to reduce risk of falls? Yes   ASSISTIVE DEVICES UTILIZED TO PREVENT FALLS:  Life alert? No  Use of a cane, walker or w/c? Yes  Grab bars in the bathroom? Yes  Shower chair or bench in shower? Yes  Elevated toilet seat or a handicapped toilet? Yes   TIMED UP  AND GO:  Was the test performed? Yes .  Length of time to ambulate 10 feet: 15 sec.   Gait slow and steady with assistive device  Cognitive Function:    03/19/2022   12:00 PM 09/03/2017   12:46 PM  MMSE - Mini Mental State Exam  Orientation to time  5  Orientation to Place 5 5  Registration 3 3  Attention/ Calculation 5 5  Recall 3 2  Language- name 2 objects 2 2  Language- repeat 1 1  Language- follow 3 step command 3 3  Language- read & follow direction 1 1  Write a sentence 1 1  Copy design 1 1  Total score  29        04/05/2020    9:37 AM  6CIT Screen  What Year? 0 points  What month? 0 points  What time? 0 points  Count back from 20 0 points  Months in reverse 0 points  Repeat phrase 0 points  Total Score 0 points    Immunizations Immunization History  Administered Date(s) Administered   Fluad Quad(high Dose 65+) 12/17/2018   Influenza, High Dose Seasonal PF 12/08/2016, 12/19/2020    Influenza-Unspecified 12/16/2019, 12/25/2021   Moderna Sars-Covid-2 Vaccination 03/09/2019, 04/06/2019, 01/12/2020   Pfizer Covid-19 Vaccine Bivalent Booster 35yrs & up 01/04/2022   Pneumococcal Conjugate-13 06/12/2013   Pneumococcal Polysaccharide-23 07/28/2002   Tdap 07/27/2009   Zoster Recombinat (Shingrix) 01/08/2017, 06/03/2017    TDAP status: Due, Education has been provided regarding the importance of this vaccine. Advised may receive this vaccine at local pharmacy or Health Dept. Aware to provide a copy of the vaccination record if obtained from local pharmacy or Health Dept. Verbalized acceptance and understanding.  Flu Vaccine status: Up to date  Pneumococcal vaccine status: Up to date  Covid-19 vaccine status: Information provided on how to obtain vaccines.   Qualifies for Shingles Vaccine? Yes   Zostavax completed Yes   Shingrix Completed?: Yes  Screening Tests Health Maintenance  Topic Date Due   DTaP/Tdap/Td (2 - Td or Tdap) 07/28/2019   COVID-19 Vaccine (5 - 2023-24 season) 03/01/2022   Medicare Annual Wellness (AWV)  03/17/2023   Pneumonia Vaccine 93+ Years old  Completed   INFLUENZA VACCINE  Completed   DEXA SCAN  Completed   Zoster Vaccines- Shingrix  Completed   HPV VACCINES  Aged Out    Health Maintenance  Health Maintenance Due  Topic Date Due   DTaP/Tdap/Td (2 - Td or Tdap) 07/28/2019   COVID-19 Vaccine (5 - 2023-24 season) 03/01/2022    Colorectal cancer screening: No longer required.   Mammogram status: No longer required due to aged out.  DEXA t score -2.3, repeat 2 years.   Lung Cancer Screening: (Low Dose CT Chest recommended if Age 55-80 years, 30 pack-year currently smoking OR have quit w/in 15years.) does not qualify.    Additional Screening:  Hepatitis C Screening: does not qualify;   Vision Screening: Recommended annual ophthalmology exams for early detection of glaucoma and other disorders of the eye. Is the patient up to date  with their annual eye exam?  No  Who is the provider or what is the name of the office in which the patient attends annual eye exams? Dr. Dione Booze 03/20/22 If pt is not established with a provider, would they like to be referred to a provider to establish care? No .   Dental Screening: Recommended annual dental exams for proper oral hygiene  Community Resource Referral / Chronic  Care Management: CRR required this visit?  No   CCM required this visit?  No      Plan:   Tdap ordered, pending.   I have personally reviewed and noted the following in the patient's chart:   Medical and social history Use of alcohol, tobacco or illicit drugs  Current medications and supplements including opioid prescriptions. Patient is not currently taking opioid prescriptions. Functional ability and status Nutritional status Physical activity Advanced directives List of other physicians Hospitalizations, surgeries, and ER visits in previous 12 months Vitals Screenings to include cognitive, depression, and falls Referrals and appointments  In addition, I have reviewed and discussed with patient certain preventive protocols, quality metrics, and best practice recommendations. A written personalized care plan for preventive services as well as general preventive health recommendations were provided to patient.     Timmy Bubeck X Anayeli Arel, NP   03/19/2022

## 2022-03-20 DIAGNOSIS — H26492 Other secondary cataract, left eye: Secondary | ICD-10-CM | POA: Diagnosis not present

## 2022-03-20 DIAGNOSIS — Z961 Presence of intraocular lens: Secondary | ICD-10-CM | POA: Diagnosis not present

## 2022-03-20 DIAGNOSIS — I951 Orthostatic hypotension: Secondary | ICD-10-CM | POA: Diagnosis not present

## 2022-03-20 DIAGNOSIS — H5213 Myopia, bilateral: Secondary | ICD-10-CM | POA: Diagnosis not present

## 2022-03-20 DIAGNOSIS — H53453 Other localized visual field defect, bilateral: Secondary | ICD-10-CM | POA: Diagnosis not present

## 2022-03-20 DIAGNOSIS — H35033 Hypertensive retinopathy, bilateral: Secondary | ICD-10-CM | POA: Diagnosis not present

## 2022-03-20 DIAGNOSIS — H524 Presbyopia: Secondary | ICD-10-CM | POA: Diagnosis not present

## 2022-03-21 DIAGNOSIS — R293 Abnormal posture: Secondary | ICD-10-CM | POA: Diagnosis not present

## 2022-03-21 DIAGNOSIS — R2681 Unsteadiness on feet: Secondary | ICD-10-CM | POA: Diagnosis not present

## 2022-03-21 DIAGNOSIS — M5459 Other low back pain: Secondary | ICD-10-CM | POA: Diagnosis not present

## 2022-03-21 DIAGNOSIS — R29898 Other symptoms and signs involving the musculoskeletal system: Secondary | ICD-10-CM | POA: Diagnosis not present

## 2022-03-21 DIAGNOSIS — M6281 Muscle weakness (generalized): Secondary | ICD-10-CM | POA: Diagnosis not present

## 2022-03-22 DIAGNOSIS — M5459 Other low back pain: Secondary | ICD-10-CM | POA: Diagnosis not present

## 2022-03-22 DIAGNOSIS — R29898 Other symptoms and signs involving the musculoskeletal system: Secondary | ICD-10-CM | POA: Diagnosis not present

## 2022-03-22 DIAGNOSIS — M6281 Muscle weakness (generalized): Secondary | ICD-10-CM | POA: Diagnosis not present

## 2022-03-22 DIAGNOSIS — R2681 Unsteadiness on feet: Secondary | ICD-10-CM | POA: Diagnosis not present

## 2022-03-22 DIAGNOSIS — R293 Abnormal posture: Secondary | ICD-10-CM | POA: Diagnosis not present

## 2022-03-27 DIAGNOSIS — R2681 Unsteadiness on feet: Secondary | ICD-10-CM | POA: Diagnosis not present

## 2022-03-27 DIAGNOSIS — F330 Major depressive disorder, recurrent, mild: Secondary | ICD-10-CM | POA: Diagnosis not present

## 2022-03-27 DIAGNOSIS — M6281 Muscle weakness (generalized): Secondary | ICD-10-CM | POA: Diagnosis not present

## 2022-03-27 DIAGNOSIS — F411 Generalized anxiety disorder: Secondary | ICD-10-CM | POA: Diagnosis not present

## 2022-03-27 DIAGNOSIS — M5459 Other low back pain: Secondary | ICD-10-CM | POA: Diagnosis not present

## 2022-03-27 DIAGNOSIS — R29898 Other symptoms and signs involving the musculoskeletal system: Secondary | ICD-10-CM | POA: Diagnosis not present

## 2022-03-27 DIAGNOSIS — R293 Abnormal posture: Secondary | ICD-10-CM | POA: Diagnosis not present

## 2022-03-28 DIAGNOSIS — R29898 Other symptoms and signs involving the musculoskeletal system: Secondary | ICD-10-CM | POA: Diagnosis not present

## 2022-03-28 DIAGNOSIS — R293 Abnormal posture: Secondary | ICD-10-CM | POA: Diagnosis not present

## 2022-03-28 DIAGNOSIS — M6281 Muscle weakness (generalized): Secondary | ICD-10-CM | POA: Diagnosis not present

## 2022-03-28 DIAGNOSIS — R2681 Unsteadiness on feet: Secondary | ICD-10-CM | POA: Diagnosis not present

## 2022-03-28 DIAGNOSIS — M5459 Other low back pain: Secondary | ICD-10-CM | POA: Diagnosis not present

## 2022-03-29 DIAGNOSIS — R29898 Other symptoms and signs involving the musculoskeletal system: Secondary | ICD-10-CM | POA: Diagnosis not present

## 2022-03-29 DIAGNOSIS — R293 Abnormal posture: Secondary | ICD-10-CM | POA: Diagnosis not present

## 2022-03-29 DIAGNOSIS — M5459 Other low back pain: Secondary | ICD-10-CM | POA: Diagnosis not present

## 2022-03-29 DIAGNOSIS — M6281 Muscle weakness (generalized): Secondary | ICD-10-CM | POA: Diagnosis not present

## 2022-03-29 DIAGNOSIS — R2681 Unsteadiness on feet: Secondary | ICD-10-CM | POA: Diagnosis not present

## 2022-03-30 DIAGNOSIS — R2681 Unsteadiness on feet: Secondary | ICD-10-CM | POA: Diagnosis not present

## 2022-03-30 DIAGNOSIS — R293 Abnormal posture: Secondary | ICD-10-CM | POA: Diagnosis not present

## 2022-03-30 DIAGNOSIS — R29898 Other symptoms and signs involving the musculoskeletal system: Secondary | ICD-10-CM | POA: Diagnosis not present

## 2022-03-30 DIAGNOSIS — M5459 Other low back pain: Secondary | ICD-10-CM | POA: Diagnosis not present

## 2022-03-30 DIAGNOSIS — M6281 Muscle weakness (generalized): Secondary | ICD-10-CM | POA: Diagnosis not present

## 2022-04-02 DIAGNOSIS — F330 Major depressive disorder, recurrent, mild: Secondary | ICD-10-CM | POA: Diagnosis not present

## 2022-04-03 DIAGNOSIS — R2681 Unsteadiness on feet: Secondary | ICD-10-CM | POA: Diagnosis not present

## 2022-04-03 DIAGNOSIS — R293 Abnormal posture: Secondary | ICD-10-CM | POA: Diagnosis not present

## 2022-04-03 DIAGNOSIS — M5459 Other low back pain: Secondary | ICD-10-CM | POA: Diagnosis not present

## 2022-04-03 DIAGNOSIS — M6281 Muscle weakness (generalized): Secondary | ICD-10-CM | POA: Diagnosis not present

## 2022-04-03 DIAGNOSIS — R29898 Other symptoms and signs involving the musculoskeletal system: Secondary | ICD-10-CM | POA: Diagnosis not present

## 2022-04-04 DIAGNOSIS — M6281 Muscle weakness (generalized): Secondary | ICD-10-CM | POA: Diagnosis not present

## 2022-04-04 DIAGNOSIS — R2681 Unsteadiness on feet: Secondary | ICD-10-CM | POA: Diagnosis not present

## 2022-04-04 DIAGNOSIS — M5459 Other low back pain: Secondary | ICD-10-CM | POA: Diagnosis not present

## 2022-04-04 DIAGNOSIS — R29898 Other symptoms and signs involving the musculoskeletal system: Secondary | ICD-10-CM | POA: Diagnosis not present

## 2022-04-04 DIAGNOSIS — R293 Abnormal posture: Secondary | ICD-10-CM | POA: Diagnosis not present

## 2022-04-05 DIAGNOSIS — M5459 Other low back pain: Secondary | ICD-10-CM | POA: Diagnosis not present

## 2022-04-05 DIAGNOSIS — R2681 Unsteadiness on feet: Secondary | ICD-10-CM | POA: Diagnosis not present

## 2022-04-05 DIAGNOSIS — M6281 Muscle weakness (generalized): Secondary | ICD-10-CM | POA: Diagnosis not present

## 2022-04-05 DIAGNOSIS — R293 Abnormal posture: Secondary | ICD-10-CM | POA: Diagnosis not present

## 2022-04-05 DIAGNOSIS — R29898 Other symptoms and signs involving the musculoskeletal system: Secondary | ICD-10-CM | POA: Diagnosis not present

## 2022-04-06 DIAGNOSIS — R293 Abnormal posture: Secondary | ICD-10-CM | POA: Diagnosis not present

## 2022-04-06 DIAGNOSIS — R2681 Unsteadiness on feet: Secondary | ICD-10-CM | POA: Diagnosis not present

## 2022-04-06 DIAGNOSIS — R29898 Other symptoms and signs involving the musculoskeletal system: Secondary | ICD-10-CM | POA: Diagnosis not present

## 2022-04-06 DIAGNOSIS — M6281 Muscle weakness (generalized): Secondary | ICD-10-CM | POA: Diagnosis not present

## 2022-04-06 DIAGNOSIS — M5459 Other low back pain: Secondary | ICD-10-CM | POA: Diagnosis not present

## 2022-04-11 DIAGNOSIS — R2681 Unsteadiness on feet: Secondary | ICD-10-CM | POA: Diagnosis not present

## 2022-04-11 DIAGNOSIS — R293 Abnormal posture: Secondary | ICD-10-CM | POA: Diagnosis not present

## 2022-04-11 DIAGNOSIS — M5459 Other low back pain: Secondary | ICD-10-CM | POA: Diagnosis not present

## 2022-04-11 DIAGNOSIS — R29898 Other symptoms and signs involving the musculoskeletal system: Secondary | ICD-10-CM | POA: Diagnosis not present

## 2022-04-11 DIAGNOSIS — M6281 Muscle weakness (generalized): Secondary | ICD-10-CM | POA: Diagnosis not present

## 2022-04-12 DIAGNOSIS — R293 Abnormal posture: Secondary | ICD-10-CM | POA: Diagnosis not present

## 2022-04-12 DIAGNOSIS — R2681 Unsteadiness on feet: Secondary | ICD-10-CM | POA: Diagnosis not present

## 2022-04-12 DIAGNOSIS — M5459 Other low back pain: Secondary | ICD-10-CM | POA: Diagnosis not present

## 2022-04-12 DIAGNOSIS — M6281 Muscle weakness (generalized): Secondary | ICD-10-CM | POA: Diagnosis not present

## 2022-04-12 DIAGNOSIS — R29898 Other symptoms and signs involving the musculoskeletal system: Secondary | ICD-10-CM | POA: Diagnosis not present

## 2022-04-13 DIAGNOSIS — M6281 Muscle weakness (generalized): Secondary | ICD-10-CM | POA: Diagnosis not present

## 2022-04-13 DIAGNOSIS — R29898 Other symptoms and signs involving the musculoskeletal system: Secondary | ICD-10-CM | POA: Diagnosis not present

## 2022-04-13 DIAGNOSIS — R2681 Unsteadiness on feet: Secondary | ICD-10-CM | POA: Diagnosis not present

## 2022-04-13 DIAGNOSIS — R293 Abnormal posture: Secondary | ICD-10-CM | POA: Diagnosis not present

## 2022-04-13 DIAGNOSIS — M5459 Other low back pain: Secondary | ICD-10-CM | POA: Diagnosis not present

## 2022-04-17 DIAGNOSIS — R29898 Other symptoms and signs involving the musculoskeletal system: Secondary | ICD-10-CM | POA: Diagnosis not present

## 2022-04-17 DIAGNOSIS — R2681 Unsteadiness on feet: Secondary | ICD-10-CM | POA: Diagnosis not present

## 2022-04-17 DIAGNOSIS — M6281 Muscle weakness (generalized): Secondary | ICD-10-CM | POA: Diagnosis not present

## 2022-04-17 DIAGNOSIS — M5459 Other low back pain: Secondary | ICD-10-CM | POA: Diagnosis not present

## 2022-04-17 DIAGNOSIS — R293 Abnormal posture: Secondary | ICD-10-CM | POA: Diagnosis not present

## 2022-04-19 ENCOUNTER — Ambulatory Visit: Payer: Medicare PPO | Admitting: Nurse Practitioner

## 2022-04-20 DIAGNOSIS — R29898 Other symptoms and signs involving the musculoskeletal system: Secondary | ICD-10-CM | POA: Diagnosis not present

## 2022-04-20 DIAGNOSIS — M6281 Muscle weakness (generalized): Secondary | ICD-10-CM | POA: Diagnosis not present

## 2022-04-20 DIAGNOSIS — R2681 Unsteadiness on feet: Secondary | ICD-10-CM | POA: Diagnosis not present

## 2022-04-20 DIAGNOSIS — R293 Abnormal posture: Secondary | ICD-10-CM | POA: Diagnosis not present

## 2022-04-20 DIAGNOSIS — M5459 Other low back pain: Secondary | ICD-10-CM | POA: Diagnosis not present

## 2022-04-24 DIAGNOSIS — R2681 Unsteadiness on feet: Secondary | ICD-10-CM | POA: Diagnosis not present

## 2022-04-24 DIAGNOSIS — R29898 Other symptoms and signs involving the musculoskeletal system: Secondary | ICD-10-CM | POA: Diagnosis not present

## 2022-04-24 DIAGNOSIS — R293 Abnormal posture: Secondary | ICD-10-CM | POA: Diagnosis not present

## 2022-04-24 DIAGNOSIS — M5459 Other low back pain: Secondary | ICD-10-CM | POA: Diagnosis not present

## 2022-04-24 DIAGNOSIS — M6281 Muscle weakness (generalized): Secondary | ICD-10-CM | POA: Diagnosis not present

## 2022-04-24 DIAGNOSIS — F330 Major depressive disorder, recurrent, mild: Secondary | ICD-10-CM | POA: Diagnosis not present

## 2022-04-24 DIAGNOSIS — F411 Generalized anxiety disorder: Secondary | ICD-10-CM | POA: Diagnosis not present

## 2022-04-27 DIAGNOSIS — M5459 Other low back pain: Secondary | ICD-10-CM | POA: Diagnosis not present

## 2022-04-27 DIAGNOSIS — M6281 Muscle weakness (generalized): Secondary | ICD-10-CM | POA: Diagnosis not present

## 2022-04-27 DIAGNOSIS — R29898 Other symptoms and signs involving the musculoskeletal system: Secondary | ICD-10-CM | POA: Diagnosis not present

## 2022-04-27 DIAGNOSIS — R293 Abnormal posture: Secondary | ICD-10-CM | POA: Diagnosis not present

## 2022-04-27 DIAGNOSIS — R2681 Unsteadiness on feet: Secondary | ICD-10-CM | POA: Diagnosis not present

## 2022-05-07 DIAGNOSIS — F330 Major depressive disorder, recurrent, mild: Secondary | ICD-10-CM | POA: Diagnosis not present

## 2022-05-29 ENCOUNTER — Encounter: Payer: Self-pay | Admitting: Family Medicine

## 2022-05-29 ENCOUNTER — Non-Acute Institutional Stay: Payer: Medicare PPO | Admitting: Family Medicine

## 2022-05-29 DIAGNOSIS — K219 Gastro-esophageal reflux disease without esophagitis: Secondary | ICD-10-CM | POA: Diagnosis not present

## 2022-05-29 DIAGNOSIS — I10 Essential (primary) hypertension: Secondary | ICD-10-CM | POA: Diagnosis not present

## 2022-05-29 DIAGNOSIS — I2699 Other pulmonary embolism without acute cor pulmonale: Secondary | ICD-10-CM

## 2022-05-29 NOTE — Progress Notes (Unsigned)
Location:  [facility] Room Number: [place] of Service:  ALF [phone]) Provider:  Wardell Honour, MD  Patient Care Team: Wardell Honour, MD as PCP - General (Family Medicine) Buford Dresser, MD as PCP - Cardiology (Cardiology) [name], [name], NP as Nurse Practitioner (Internal Medicine) Keene Breath., MD (Ophthalmology) Danella Sensing, MD as Consulting Physician (Dermatology) Danella Sensing, MD as Consulting Physician (Dermatology) Delila Pereyra, MD as Consulting Physician (Gynecology)  Extended Emergency Contact Information Primary Emergency Contact: Gaetana Michaelis States of [place] Phone: [phone] Relation: Daughter Secondary Emergency Contact: Black,Joel Mobile Phone: [phone] Relation: Son  Code Status:  Full Code Goals of care: Advanced Directive information    05/29/2022    2:50 PM  Advanced Directives  Does Patient Have a Medical Advance Directive? Yes  Type of Advance Directive Living will  Does patient want to make changes to medical advance directive? No - Patient declined     Chief Complaint  Patient presents with   Medical Management of Chronic Issues    Medical Management of Chronic Issues.     HPI:  Pt is a 88 y.o. female seen today for medical management of chronic diseases.  Problems include 2 prior CVAs, recent acute pulmonary embolism, depression and anxiety, gait abnormality, Still getting adjusted to her move from independent living to assisted living.  Needs help arranging furniture and disposing of some records and papers. As usual she has a list of discussion items today; for she describes reflux but when really real down is more dysphagia rather than indigestion or heartburn.  She denies coughing or getting choked with swallowing liquids or food but has a sensation of sticking in her throat.  Also today complains of some pain in her left upper arm with certain movements she is seeing physical  therapy for strengthening and fall prevention. She was doing well from the point of view of depression and anxiety.  Endorses sleeping okay Had some fecal incontinence which makes her stay in her room more.  Will discontinue stool softener for now.   Past Medical History:  Diagnosis Date   Anginal pain ([place]) 08/24/2010   Echo-EF =>55%,LV normal   Arthritis    Cataract 12/2006   Both eyes  Epes MD   Chest pain, unspecified 09/04/2007   Lexiscan-- EF 72%; LV normal   Constipation    Takes flax seed and honey .  Smooth Move tea.   Depression    Diverticulitis    Head injury, closed    with fall   History of cardiac cath 2012   normal coronary arteries   Hypertension    Mitral valve prolapse    Osteopenia    Stroke ([place]) 09/02/2019   Past Surgical History:  Procedure Laterality Date   ANTERIOR CERVICAL DECOMP/DISCECTOMY FUSION N/A 10/23/2012   Procedure: Cervical Five to Cervical Six, Cervical Six to Cervical Seven anterior cervical decompression with fusion plating and bonegraft;  Surgeon: Hosie Spangle, MD;  Location: [facility];  Service: Neurosurgery;  Laterality: N/A;  ANTERIOR CERVICAL DECOMPRESSION/DISCECTOMY FUSION 2 LEVELS   BREAST CYST ASPIRATION  1990   benign   CARDIAC CATHETERIZATION  08/01/2010   normal coronaries   COLONOSCOPY  approx 2011   EYE SURGERY Bilateral 2011   intestinal blockage surgery  Feb. 2012   "kink in small intestine" per pt    Allergies  Allergen Reactions   Contrast Media [Iodinated Contrast Media] Hives    Hives during IVP at  urology office '02, requires 13 hr prep///a.calhoun  Patient came thru ER and had a 1 hour pre-medication and did fine   Oxycontin [Oxycodone] Other (See Comments)    Decreased appetite.   Boniva [Ibandronic Acid] Nausea And Vomiting and Other (See Comments)    Weakness    Codeine Diarrhea and Nausea And Vomiting   Darvocet [Propoxyphene N-Acetaminophen] Diarrhea and Nausea And Vomiting   Erythromycin  Nausea And Vomiting    Outpatient Encounter Medications as of 05/29/2022  Medication Sig   acetaminophen (TYLENOL) 325 MG tablet Take 650 mg by mouth at bedtime. 2 entries: 650 mg every 4 hours as needed   alendronate (FOSAMAX) 70 MG tablet TAKE 1 TAB ONCE A WEEK, AT LEAST 30 MIN BEFORE 1ST FOOD.DO NOT LIE DOWN FOR 30 MIN AFTER TAKING.   amLODipine (NORVASC) 5 MG tablet Take 5 mg by mouth in the morning.   apixaban (ELIQUIS) 5 MG TABS tablet Take 2.5 mg by mouth 2 (two) times daily.   atorvastatin (LIPITOR) 20 MG tablet TAKE ONE TABLET BY MOUTH ONCE DAILY.   Calcium Carbonate-Vitamin D (CALCIUM 600 +D HIGH POTENCY) 600-10 MG-MCG TABS Take 1 tablet by mouth in the morning.   citalopram (CELEXA) 20 MG tablet Take 1 tablet (20 mg total) by mouth daily.   clopidogrel (PLAVIX) 75 MG tablet TAKE 1 TABLET ONCE DAILY.   docusate sodium (COLACE) 100 MG capsule Take 100 mg by mouth at bedtime.   metoprolol tartrate (LOPRESSOR) 25 MG tablet Take 12.5 mg by mouth 2 (two) times daily.   pantoprazole (PROTONIX) 40 MG tablet TAKE 1 TABLET ONCE DAILY.   [DISCONTINUED] apixaban (ELIQUIS) 5 MG TABS tablet Take 2 tablets (10 mg total) by mouth 2 (two) times daily for 7 days, THEN 1 tablet (5 mg total) 2 (two) times daily for 23 days.   No facility-administered encounter medications on file as of 05/29/2022.    Review of Systems  Constitutional:  Positive for activity change and fatigue.  HENT: Negative.    Respiratory: Negative.    Cardiovascular: Negative.   Gastrointestinal: Negative.        Sensation of food sticking (swallowing difficulty)  Neurological: Negative.   Psychiatric/Behavioral: Negative.      Immunization History  Administered Date(s) Administered   Fluad Quad(high Dose 65+) 12/17/2018   Influenza, High Dose Seasonal PF 12/08/2016, 12/19/2020   Influenza-Unspecified 12/16/2019, 12/25/2021   Moderna Sars-Covid-2 Vaccination 03/09/2019, 04/06/2019, 01/12/2020   Pfizer Covid-19 Vaccine  Bivalent Booster 51yrs & up 01/04/2022   Pneumococcal Conjugate-13 06/12/2013   Pneumococcal Polysaccharide-23 07/28/2002   RSV,unspecified 04/05/2022   Tdap 07/27/2009   Zoster Recombinat (Shingrix) 01/08/2017, 06/03/2017   Pertinent  Health Maintenance Due  Topic Date Due   INFLUENZA VACCINE  Completed   DEXA SCAN  Completed      01/26/2022   10:08 PM 01/27/2022   10:25 AM 01/28/2022    8:15 AM 03/13/2022    9:11 AM 03/16/2022    2:03 PM  Fall Risk  Falls in the past year?    0 0  Was there an injury with Fall?    0 0  Fall Risk Category Calculator    0 0  Fall Risk Category (Retired)    Low Low  (RETIRED) Patient Fall Risk Level Low fall risk Low fall risk Moderate fall risk Low fall risk Low fall risk  Patient at Risk for Falls Due to    No Fall Risks No Fall Risks  Fall risk Follow up  Falls evaluation completed Falls evaluation completed   Functional Status Survey:    Vitals:   05/29/22 1442  BP: 128/69  Pulse: 70  Resp: 18  Temp: 97.8 F (36.6 C)  SpO2: 96%  Weight: 156 lb 3.2 oz (70.9 kg)  Height: 5\' 6"  (1.676 m)   Body mass index is 25.21 kg/m. Physical Exam Vitals and nursing note reviewed.  Constitutional:      Appearance: Normal appearance.  HENT:     Mouth/Throat:     Mouth: Mucous membranes are moist.     Pharynx: Oropharynx is clear.  Cardiovascular:     Rate and Rhythm: Normal rate and regular rhythm.  Pulmonary:     Effort: Pulmonary effort is normal.     Breath sounds: Normal breath sounds.  Abdominal:     General: Bowel sounds are normal.     Palpations: Abdomen is soft.  Musculoskeletal:     Comments: Able to walk in her room but uses scooter for longer distances outside room  Neurological:     General: No focal deficit present.     Mental Status: She is alert and oriented to person, place, and time.     Comments: Does have mild left-sided weakness  Psychiatric:        Mood and Affect: Mood normal.        Behavior: Behavior  normal.     Labs reviewed: Recent Labs    01/26/22 2223 01/28/22 0427  NA 138 140  K 3.3* 3.7  CL 104 108  CO2 25 24  GLUCOSE 125* 108*  BUN 13 14  CREATININE 0.86 0.81  CALCIUM 9.6 9.0   No results for input(s): "AST", "ALT", "ALKPHOS", "BILITOT", "PROT", "ALBUMIN" in the last 8760 hours. Recent Labs    10/19/21 0729 01/26/22 2223 01/28/22 0427  WBC 5.4 11.9* 11.4*  NEUTROABS 3,397  --   --   HGB 13.0 13.1 12.9  HCT 38.0 38.5 38.0  MCV 92.0 93.2 93.4  PLT 239 233 225   Lab Results  Component Value Date   TSH 4.05 06/30/2019   Lab Results  Component Value Date   HGBA1C 5.3 09/03/2019   Lab Results  Component Value Date   CHOL 115 10/19/2021   HDL 43 (L) 10/19/2021   LDLCALC 58 10/19/2021   TRIG 67 10/19/2021   CHOLHDL 2.7 10/19/2021    Significant Diagnostic Results in last 30 days:  No results found.  Assessment/Plan 1. Gastroesophageal reflux disease, unspecified whether esophagitis present Will ask speech therapy to initiate evaluation.  May need barium swallow or referral for endoscopy  2. Primary hypertension Blood pressures have been good on current regimen of amlodipine and metoprolol    3. Other acute pulmonary embolism without acute cor pulmonale (HCC) On apixaban indefinitely.  No problems.  No chest pain or shortness of breath   Family/ staff Communication:   Labs/tests ordered:

## 2022-06-04 DIAGNOSIS — F330 Major depressive disorder, recurrent, mild: Secondary | ICD-10-CM | POA: Diagnosis not present

## 2022-06-07 DIAGNOSIS — M5459 Other low back pain: Secondary | ICD-10-CM | POA: Diagnosis not present

## 2022-06-07 DIAGNOSIS — R2681 Unsteadiness on feet: Secondary | ICD-10-CM | POA: Diagnosis not present

## 2022-06-07 DIAGNOSIS — M6281 Muscle weakness (generalized): Secondary | ICD-10-CM | POA: Diagnosis not present

## 2022-06-07 DIAGNOSIS — R1319 Other dysphagia: Secondary | ICD-10-CM | POA: Diagnosis not present

## 2022-06-08 DIAGNOSIS — M6281 Muscle weakness (generalized): Secondary | ICD-10-CM | POA: Diagnosis not present

## 2022-06-08 DIAGNOSIS — M5459 Other low back pain: Secondary | ICD-10-CM | POA: Diagnosis not present

## 2022-06-08 DIAGNOSIS — R1319 Other dysphagia: Secondary | ICD-10-CM | POA: Diagnosis not present

## 2022-06-08 DIAGNOSIS — R2681 Unsteadiness on feet: Secondary | ICD-10-CM | POA: Diagnosis not present

## 2022-06-11 DIAGNOSIS — R2681 Unsteadiness on feet: Secondary | ICD-10-CM | POA: Diagnosis not present

## 2022-06-11 DIAGNOSIS — R1319 Other dysphagia: Secondary | ICD-10-CM | POA: Diagnosis not present

## 2022-06-11 DIAGNOSIS — M5459 Other low back pain: Secondary | ICD-10-CM | POA: Diagnosis not present

## 2022-06-11 DIAGNOSIS — M6281 Muscle weakness (generalized): Secondary | ICD-10-CM | POA: Diagnosis not present

## 2022-06-14 DIAGNOSIS — R1319 Other dysphagia: Secondary | ICD-10-CM | POA: Diagnosis not present

## 2022-06-14 DIAGNOSIS — R2681 Unsteadiness on feet: Secondary | ICD-10-CM | POA: Diagnosis not present

## 2022-06-14 DIAGNOSIS — M6281 Muscle weakness (generalized): Secondary | ICD-10-CM | POA: Diagnosis not present

## 2022-06-14 DIAGNOSIS — M5459 Other low back pain: Secondary | ICD-10-CM | POA: Diagnosis not present

## 2022-06-18 DIAGNOSIS — M5459 Other low back pain: Secondary | ICD-10-CM | POA: Diagnosis not present

## 2022-06-18 DIAGNOSIS — R2681 Unsteadiness on feet: Secondary | ICD-10-CM | POA: Diagnosis not present

## 2022-06-18 DIAGNOSIS — F330 Major depressive disorder, recurrent, mild: Secondary | ICD-10-CM | POA: Diagnosis not present

## 2022-06-18 DIAGNOSIS — M6281 Muscle weakness (generalized): Secondary | ICD-10-CM | POA: Diagnosis not present

## 2022-06-18 DIAGNOSIS — R1319 Other dysphagia: Secondary | ICD-10-CM | POA: Diagnosis not present

## 2022-06-19 ENCOUNTER — Encounter: Payer: Self-pay | Admitting: Nurse Practitioner

## 2022-06-19 DIAGNOSIS — W19XXXA Unspecified fall, initial encounter: Secondary | ICD-10-CM | POA: Insufficient documentation

## 2022-06-21 DIAGNOSIS — M6281 Muscle weakness (generalized): Secondary | ICD-10-CM | POA: Diagnosis not present

## 2022-06-21 DIAGNOSIS — R2681 Unsteadiness on feet: Secondary | ICD-10-CM | POA: Diagnosis not present

## 2022-06-21 DIAGNOSIS — R1319 Other dysphagia: Secondary | ICD-10-CM | POA: Diagnosis not present

## 2022-06-21 DIAGNOSIS — M5459 Other low back pain: Secondary | ICD-10-CM | POA: Diagnosis not present

## 2022-06-25 ENCOUNTER — Non-Acute Institutional Stay: Payer: Medicare PPO | Admitting: Adult Health

## 2022-06-25 ENCOUNTER — Encounter: Payer: Self-pay | Admitting: Adult Health

## 2022-06-25 DIAGNOSIS — J439 Emphysema, unspecified: Secondary | ICD-10-CM | POA: Diagnosis not present

## 2022-06-25 DIAGNOSIS — R2681 Unsteadiness on feet: Secondary | ICD-10-CM | POA: Diagnosis not present

## 2022-06-25 DIAGNOSIS — R0602 Shortness of breath: Secondary | ICD-10-CM

## 2022-06-25 DIAGNOSIS — M5459 Other low back pain: Secondary | ICD-10-CM | POA: Diagnosis not present

## 2022-06-25 DIAGNOSIS — I10 Essential (primary) hypertension: Secondary | ICD-10-CM | POA: Diagnosis not present

## 2022-06-25 DIAGNOSIS — R1319 Other dysphagia: Secondary | ICD-10-CM | POA: Diagnosis not present

## 2022-06-25 DIAGNOSIS — J9809 Other diseases of bronchus, not elsewhere classified: Secondary | ICD-10-CM | POA: Diagnosis not present

## 2022-06-25 DIAGNOSIS — M6281 Muscle weakness (generalized): Secondary | ICD-10-CM | POA: Diagnosis not present

## 2022-06-25 DIAGNOSIS — I2782 Chronic pulmonary embolism: Secondary | ICD-10-CM

## 2022-06-25 LAB — BASIC METABOLIC PANEL
BUN: 15 (ref 4–21)
CO2: 26 — AB (ref 13–22)
Chloride: 106 (ref 99–108)
Creatinine: 0.7 (ref 0.5–1.1)
Glucose: 107
Potassium: 3.9 meq/L (ref 3.5–5.1)
Sodium: 138 (ref 137–147)

## 2022-06-25 LAB — CBC AND DIFFERENTIAL
HCT: 38 (ref 36–46)
Hemoglobin: 12.7 (ref 12.0–16.0)
Neutrophils Absolute: 3776
Platelets: 245 10*3/uL (ref 150–400)
WBC: 5.9

## 2022-06-25 LAB — COMPREHENSIVE METABOLIC PANEL
Calcium: 9.7 (ref 8.7–10.7)
eGFR: 79

## 2022-06-25 LAB — CBC: RBC: 4.2 (ref 3.87–5.11)

## 2022-06-25 NOTE — Progress Notes (Unsigned)
Location:  [facility] Room Number: [phone] Place of Service:  ALF [phone]) Provider:  Margit Banda. Grayce Sessions, NP   Patient Care Team: Frederica Kuster, MD as PCP - General (Family Medicine) [name], MD as PCP - Cardiology (Cardiology) [name], [name], NP as Nurse Practitioner (Internal Medicine) Marzella Schlein., MD (Ophthalmology) Arminda Resides, MD as Consulting Physician (Dermatology) Arminda Resides, MD as Consulting Physician (Dermatology) Teodora Medici, MD as Consulting Physician (Gynecology)  Extended Emergency Contact Information Primary Emergency Contact: Aline Brochure States of [place] Phone: [phone] Relation: Daughter Secondary Emergency Contact: Black,Joel Mobile Phone: [phone] Relation: Son  Code Status:  Full Code Goals of care: Advanced Directive information    06/25/2022   11:44 AM  Advanced Directives  Does Patient Have a Medical Advance Directive? Yes  Type of Advance Directive Living will  Does patient want to make changes to medical advance directive? No - Patient declined     Chief Complaint  Patient presents with   Acute Visit    shortness of breath    HPI:  Pt is a 88 y.o. female seen today for an acute visit for    Past Medical History:  Diagnosis Date   Anginal pain 08/24/2010   Echo-EF =>55%,LV normal   Arthritis    Cataract 12/2006   Both eyes  Epes MD   Chest pain, unspecified 09/04/2007   Lexiscan-- EF 72%; LV normal   Constipation    Takes flax seed and honey .  Smooth Move tea.   Depression    Diverticulitis    Head injury, closed    with fall   History of cardiac cath 2012   normal coronary arteries   Hypertension    Mitral valve prolapse    Osteopenia    Stroke 09/02/2019   Past Surgical History:  Procedure Laterality Date   ANTERIOR CERVICAL DECOMP/DISCECTOMY FUSION N/A 10/23/2012   Procedure: Cervical Five to Cervical Six, Cervical Six to Cervical Seven anterior  cervical decompression with fusion plating and bonegraft;  Surgeon: Hewitt Shorts, MD;  Location: [facility];  Service: Neurosurgery;  Laterality: N/A;  ANTERIOR CERVICAL DECOMPRESSION/DISCECTOMY FUSION 2 LEVELS   BREAST CYST ASPIRATION  1990   benign   CARDIAC CATHETERIZATION  08/01/2010   normal coronaries   COLONOSCOPY  approx 2011   EYE SURGERY Bilateral 2011   intestinal blockage surgery  Feb. 2012   "kink in small intestine" per pt    Allergies  Allergen Reactions   Contrast Media [Iodinated Contrast Media] Hives    Hives during IVP at urology office '02, requires 13 hr prep///a.calhoun  Patient came thru ER and had a 1 hour pre-medication and did fine   Oxycontin [Oxycodone] Other (See Comments)    Decreased appetite.   Boniva [Ibandronic Acid] Nausea And Vomiting and Other (See Comments)    Weakness    Codeine Diarrhea and Nausea And Vomiting   Darvocet [Propoxyphene N-Acetaminophen] Diarrhea and Nausea And Vomiting   Erythromycin Nausea And Vomiting    Outpatient Encounter Medications as of 06/25/2022  Medication Sig   acetaminophen (TYLENOL) 325 MG tablet Take 650 mg by mouth at bedtime. 2 entries: 650 mg every 4 hours as needed   alendronate (FOSAMAX) 70 MG tablet TAKE 1 TAB ONCE A WEEK, AT LEAST 30 MIN BEFORE 1ST FOOD.DO NOT LIE DOWN FOR 30 MIN AFTER TAKING.   amLODipine (NORVASC) 5 MG tablet Take 5 mg by mouth in the morning.   apixaban (  ELIQUIS) 5 MG TABS tablet Take 2.5 mg by mouth 2 (two) times daily.   atorvastatin (LIPITOR) 20 MG tablet TAKE ONE TABLET BY MOUTH ONCE DAILY.   Calcium Carbonate-Vitamin D (CALCIUM 600 +D HIGH POTENCY) 600-10 MG-MCG TABS Take 1 tablet by mouth in the morning.   citalopram (CELEXA) 20 MG tablet Take 1 tablet (20 mg total) by mouth daily.   clopidogrel (PLAVIX) 75 MG tablet TAKE 1 TABLET ONCE DAILY.   diclofenac Sodium (VOLTAREN) 1 % GEL Apply 1 g topically 2 (two) times daily.   docusate sodium (COLACE) 100 MG capsule Take 100  mg by mouth at bedtime.   metoprolol tartrate (LOPRESSOR) 25 MG tablet Take 12.5 mg by mouth 2 (two) times daily.   pantoprazole (PROTONIX) 40 MG tablet TAKE 1 TABLET ONCE DAILY.   No facility-administered encounter medications on file as of 06/25/2022.    Review of Systems  Immunization History  Administered Date(s) Administered   Fluad Quad(high Dose 65+) 12/17/2018   Influenza, High Dose Seasonal PF 12/08/2016, 12/19/2020   Influenza-Unspecified 12/16/2019, 12/25/2021   Moderna Sars-Covid-2 Vaccination 03/09/2019, 04/06/2019, 01/12/2020   Pfizer Covid-19 Vaccine Bivalent Booster 74yrs & up 01/04/2022   Pneumococcal Conjugate-13 06/12/2013   Pneumococcal Polysaccharide-23 07/28/2002   RSV,unspecified 04/05/2022   Tdap 07/27/2009   Zoster Recombinat (Shingrix) 01/08/2017, 06/03/2017   Pertinent  Health Maintenance Due  Topic Date Due   INFLUENZA VACCINE  10/04/2022   DEXA SCAN  Completed      01/26/2022   10:08 PM 01/27/2022   10:25 AM 01/28/2022    8:15 AM 03/13/2022    9:11 AM 03/16/2022    2:03 PM  Fall Risk  Falls in the past year?    0 0  Was there an injury with Fall?    0 0  Fall Risk Category Calculator    0 0  Fall Risk Category (Retired)    Low Low  (RETIRED) Patient Fall Risk Level Low fall risk Low fall risk Moderate fall risk Low fall risk Low fall risk  Patient at Risk for Falls Due to    No Fall Risks No Fall Risks  Fall risk Follow up    Falls evaluation completed Falls evaluation completed   Functional Status Survey:    Vitals:   06/25/22 1137  BP: 130/75  Pulse: (!) 57  Resp: 18  Temp: 98.8 F (37.1 C)  SpO2: 99%  Weight: 152 lb 6 oz (69.1 kg)  Height: 5\' 6"  (1.676 m)   Body mass index is 24.59 kg/m. Physical Exam  Labs reviewed: Recent Labs    01/26/22 2223 01/28/22 0427  NA 138 140  K 3.3* 3.7  CL 104 108  CO2 25 24  GLUCOSE 125* 108*  BUN 13 14  CREATININE 0.86 0.81  CALCIUM 9.6 9.0   No results for input(s): "AST", "ALT",  "ALKPHOS", "BILITOT", "PROT", "ALBUMIN" in the last 8760 hours. Recent Labs    10/19/21 0729 01/26/22 2223 01/28/22 0427  WBC 5.4 11.9* 11.4*  NEUTROABS 3,397  --   --   HGB 13.0 13.1 12.9  HCT 38.0 38.5 38.0  MCV 92.0 93.2 93.4  PLT 239 233 225   Lab Results  Component Value Date   TSH 4.05 06/30/2019   Lab Results  Component Value Date   HGBA1C 5.3 09/03/2019   Lab Results  Component Value Date   CHOL 115 10/19/2021   HDL 43 (L) 10/19/2021   LDLCALC 58 10/19/2021   TRIG 67 10/19/2021  CHOLHDL 2.7 10/19/2021    Significant Diagnostic Results in last 30 days:  No results found.  Assessment/Plan There are no diagnoses linked to this encounter.   Family/ staff Communication: ***  Labs/tests ordered:  ***

## 2022-06-26 ENCOUNTER — Encounter: Payer: Self-pay | Admitting: Nurse Practitioner

## 2022-06-26 DIAGNOSIS — J439 Emphysema, unspecified: Secondary | ICD-10-CM | POA: Insufficient documentation

## 2022-06-28 DIAGNOSIS — M5459 Other low back pain: Secondary | ICD-10-CM | POA: Diagnosis not present

## 2022-06-28 DIAGNOSIS — M6281 Muscle weakness (generalized): Secondary | ICD-10-CM | POA: Diagnosis not present

## 2022-06-28 DIAGNOSIS — R1319 Other dysphagia: Secondary | ICD-10-CM | POA: Diagnosis not present

## 2022-06-28 DIAGNOSIS — R2681 Unsteadiness on feet: Secondary | ICD-10-CM | POA: Diagnosis not present

## 2022-06-29 DIAGNOSIS — F330 Major depressive disorder, recurrent, mild: Secondary | ICD-10-CM | POA: Diagnosis not present

## 2022-06-29 DIAGNOSIS — F411 Generalized anxiety disorder: Secondary | ICD-10-CM | POA: Diagnosis not present

## 2022-06-29 NOTE — Progress Notes (Incomplete)
Location:  [facility] Room Number: [phone] Place of Service:  ALF [phone]) Provider:  Margit Banda. Grayce Sessions, NP   Patient Care Team: Frederica Kuster, MD as PCP - General (Family Medicine) [name], MD as PCP - Cardiology (Cardiology) [name], [name], NP as Nurse Practitioner (Internal Medicine) Marzella Schlein., MD (Ophthalmology) Arminda Resides, MD as Consulting Physician (Dermatology) Arminda Resides, MD as Consulting Physician (Dermatology) Teodora Medici, MD as Consulting Physician (Gynecology)  Extended Emergency Contact Information Primary Emergency Contact: Aline Brochure States of [place] Phone: [phone] Relation: Daughter Secondary Emergency Contact: Black,Joel Mobile Phone: [phone] Relation: Son  Code Status:  Full Code Goals of care: Advanced Directive information    06/25/2022   11:44 AM  Advanced Directives  Does Patient Have a Medical Advance Directive? Yes  Type of Advance Directive Living will  Does patient want to make changes to medical advance directive? No - Patient declined     Chief Complaint  Patient presents with  . Acute Visit    shortness of breath    HPI:  Pt is a 88 y.o. female seen today for an acute visit for shortness of breath. She is a resident of [facility]. She has a PMH of  pulmonary embolism and is currently on Eliquis. She stated that she had SOB last night but feels "fine" now. She denies having cough. BP 130/75. She takes Norvasc and Metoprolol tartrate for hypertension.   Past Medical History:  Diagnosis Date  . Anginal pain 08/24/2010   Echo-EF =>55%,LV normal  . Arthritis   . Cataract 12/2006   Both eyes  Epes MD  . Chest pain, unspecified 09/04/2007   Lexiscan-- EF 72%; LV normal  . Constipation    Takes flax seed and honey .  Smooth Move tea.  . Depression   . Diverticulitis   . Head injury, closed    with fall  . History of cardiac cath 2012    normal coronary arteries  . Hypertension   . Mitral valve prolapse   . Osteopenia   . Stroke 09/02/2019   Past Surgical History:  Procedure Laterality Date  . ANTERIOR CERVICAL DECOMP/DISCECTOMY FUSION N/A 10/23/2012   Procedure: Cervical Five to Cervical Six, Cervical Six to Cervical Seven anterior cervical decompression with fusion plating and bonegraft;  Surgeon: Hewitt Shorts, MD;  Location: [facility];  Service: Neurosurgery;  Laterality: N/A;  ANTERIOR CERVICAL DECOMPRESSION/DISCECTOMY FUSION 2 LEVELS  . BREAST CYST ASPIRATION  1990   benign  . CARDIAC CATHETERIZATION  08/01/2010   normal coronaries  . COLONOSCOPY  approx 2011  . EYE SURGERY Bilateral 2011  . intestinal blockage surgery  Feb. 2012   "kink in small intestine" per pt    Allergies  Allergen Reactions  . Contrast Media [Iodinated Contrast Media] Hives    Hives during IVP at urology office '02, requires 13 hr prep///a.calhoun  Patient came thru ER and had a 1 hour pre-medication and did fine  . Oxycontin [Oxycodone] Other (See Comments)    Decreased appetite.  Sandrea Hammond [Ibandronic Acid] Nausea And Vomiting and Other (See Comments)    Weakness   . Codeine Diarrhea and Nausea And Vomiting  . Darvocet [Propoxyphene N-Acetaminophen] Diarrhea and Nausea And Vomiting  . Erythromycin Nausea And Vomiting    Outpatient Encounter Medications as of 06/25/2022  Medication Sig  . acetaminophen (TYLENOL) 325 MG tablet Take 650 mg by mouth at bedtime. 2 entries: 650 mg every  4 hours as needed  . alendronate (FOSAMAX) 70 MG tablet TAKE 1 TAB ONCE A WEEK, AT LEAST 30 MIN BEFORE 1ST FOOD.DO NOT LIE DOWN FOR 30 MIN AFTER TAKING.  Marland Kitchen amLODipine (NORVASC) 5 MG tablet Take 5 mg by mouth in the morning.  Marland Kitchen apixaban (ELIQUIS) 5 MG TABS tablet Take 2.5 mg by mouth 2 (two) times daily.  Marland Kitchen atorvastatin (LIPITOR) 20 MG tablet TAKE ONE TABLET BY MOUTH ONCE DAILY.  . Calcium Carbonate-Vitamin D (CALCIUM 600 +D HIGH POTENCY) 600-10  MG-MCG TABS Take 1 tablet by mouth in the morning.  . citalopram (CELEXA) 20 MG tablet Take 1 tablet (20 mg total) by mouth daily.  . clopidogrel (PLAVIX) 75 MG tablet TAKE 1 TABLET ONCE DAILY.  Marland Kitchen diclofenac Sodium (VOLTAREN) 1 % GEL Apply 1 g topically 2 (two) times daily.  Marland Kitchen docusate sodium (COLACE) 100 MG capsule Take 100 mg by mouth at bedtime.  . metoprolol tartrate (LOPRESSOR) 25 MG tablet Take 12.5 mg by mouth 2 (two) times daily.  . pantoprazole (PROTONIX) 40 MG tablet TAKE 1 TABLET ONCE DAILY.   No facility-administered encounter medications on file as of 06/25/2022.    Review of Systems  Constitutional:  Negative for appetite change, chills, fatigue and fever.  HENT:  Negative for congestion, hearing loss, rhinorrhea and sore throat.   Eyes: Negative.   Respiratory:  Positive for shortness of breath. Negative for cough and wheezing.   Cardiovascular:  Negative for chest pain, palpitations and leg swelling.  Gastrointestinal:  Negative for abdominal pain, constipation, diarrhea, nausea and vomiting.  Genitourinary:  Negative for dysuria.  Musculoskeletal:  Negative for arthralgias, back pain and myalgias.  Skin:  Negative for color change, rash and wound.  Neurological:  Negative for dizziness, weakness and headaches.  Psychiatric/Behavioral:  Negative for behavioral problems. The patient is not nervous/anxious.     Immunization History  Administered Date(s) Administered  . Fluad Quad(high Dose 65+) 12/17/2018  . Influenza, High Dose Seasonal PF 12/08/2016, 12/19/2020  . Influenza-Unspecified 12/16/2019, 12/25/2021  . Moderna Sars-Covid-2 Vaccination 03/09/2019, 04/06/2019, 01/12/2020  . [employer] 63yrs & up 01/04/2022  . Pneumococcal Conjugate-13 06/12/2013  . Pneumococcal Polysaccharide-23 07/28/2002  . RSV,unspecified 04/05/2022  . Tdap 07/27/2009  . Zoster Recombinat (Shingrix) 01/08/2017, 06/03/2017   Pertinent  Health Maintenance Due   Topic Date Due  . INFLUENZA VACCINE  10/04/2022  . DEXA SCAN  Completed      01/26/2022   10:08 PM 01/27/2022   10:25 AM 01/28/2022    8:15 AM 03/13/2022    9:11 AM 03/16/2022    2:03 PM  Fall Risk  Falls in the past year?    0 0  Was there an injury with Fall?    0 0  Fall Risk Category Calculator    0 0  Fall Risk Category (Retired)    Low Low  (RETIRED) Patient Fall Risk Level Low fall risk Low fall risk Moderate fall risk Low fall risk Low fall risk  Patient at Risk for Falls Due to    No Fall Risks No Fall Risks  Fall risk Follow up    Falls evaluation completed Falls evaluation completed   Functional Status Survey:    Vitals:   06/25/22 1137  BP: 130/75  Pulse: (!) 57  Resp: 18  Temp: 98.8 F (37.1 C)  SpO2: 99%  Weight: 152 lb 6 oz (69.1 kg)  Height: 5\' 6"  (1.676 m)   Body mass index is 24.59  kg/m. Physical Exam  Labs reviewed: Recent Labs    01/26/22 2223 01/28/22 0427  NA 138 140  K 3.3* 3.7  CL 104 108  CO2 25 24  GLUCOSE 125* 108*  BUN 13 14  CREATININE 0.86 0.81  CALCIUM 9.6 9.0   No results for input(s): "AST", "ALT", "ALKPHOS", "BILITOT", "PROT", "ALBUMIN" in the last 8760 hours. Recent Labs    10/19/21 0729 01/26/22 2223 01/28/22 0427  WBC 5.4 11.9* 11.4*  NEUTROABS 3,397  --   --   HGB 13.0 13.1 12.9  HCT 38.0 38.5 38.0  MCV 92.0 93.2 93.4  PLT 239 233 225   Lab Results  Component Value Date   TSH 4.05 06/30/2019   Lab Results  Component Value Date   HGBA1C 5.3 09/03/2019   Lab Results  Component Value Date   CHOL 115 10/19/2021   HDL 43 (L) 10/19/2021   LDLCALC 58 10/19/2021   TRIG 67 10/19/2021   CHOLHDL 2.7 10/19/2021    Significant Diagnostic Results in last 30 days:  No results found.  Assessment/Plan There are no diagnoses linked to this encounter.   Family/ staff Communication: ***  Labs/tests ordered:  ***

## 2022-07-02 DIAGNOSIS — F330 Major depressive disorder, recurrent, mild: Secondary | ICD-10-CM | POA: Diagnosis not present

## 2022-07-03 DIAGNOSIS — M5459 Other low back pain: Secondary | ICD-10-CM | POA: Diagnosis not present

## 2022-07-03 DIAGNOSIS — R2681 Unsteadiness on feet: Secondary | ICD-10-CM | POA: Diagnosis not present

## 2022-07-03 DIAGNOSIS — M6281 Muscle weakness (generalized): Secondary | ICD-10-CM | POA: Diagnosis not present

## 2022-07-03 DIAGNOSIS — R1319 Other dysphagia: Secondary | ICD-10-CM | POA: Diagnosis not present

## 2022-07-05 DIAGNOSIS — R1319 Other dysphagia: Secondary | ICD-10-CM | POA: Diagnosis not present

## 2022-07-05 DIAGNOSIS — M6281 Muscle weakness (generalized): Secondary | ICD-10-CM | POA: Diagnosis not present

## 2022-07-05 DIAGNOSIS — M5459 Other low back pain: Secondary | ICD-10-CM | POA: Diagnosis not present

## 2022-07-05 DIAGNOSIS — R2681 Unsteadiness on feet: Secondary | ICD-10-CM | POA: Diagnosis not present

## 2022-07-09 DIAGNOSIS — R2681 Unsteadiness on feet: Secondary | ICD-10-CM | POA: Diagnosis not present

## 2022-07-09 DIAGNOSIS — R1319 Other dysphagia: Secondary | ICD-10-CM | POA: Diagnosis not present

## 2022-07-09 DIAGNOSIS — M6281 Muscle weakness (generalized): Secondary | ICD-10-CM | POA: Diagnosis not present

## 2022-07-09 DIAGNOSIS — M5459 Other low back pain: Secondary | ICD-10-CM | POA: Diagnosis not present

## 2022-07-10 DIAGNOSIS — M5459 Other low back pain: Secondary | ICD-10-CM | POA: Diagnosis not present

## 2022-07-10 DIAGNOSIS — R2681 Unsteadiness on feet: Secondary | ICD-10-CM | POA: Diagnosis not present

## 2022-07-10 DIAGNOSIS — R1319 Other dysphagia: Secondary | ICD-10-CM | POA: Diagnosis not present

## 2022-07-10 DIAGNOSIS — M6281 Muscle weakness (generalized): Secondary | ICD-10-CM | POA: Diagnosis not present

## 2022-07-12 DIAGNOSIS — R1319 Other dysphagia: Secondary | ICD-10-CM | POA: Diagnosis not present

## 2022-07-12 DIAGNOSIS — R2681 Unsteadiness on feet: Secondary | ICD-10-CM | POA: Diagnosis not present

## 2022-07-12 DIAGNOSIS — M6281 Muscle weakness (generalized): Secondary | ICD-10-CM | POA: Diagnosis not present

## 2022-07-12 DIAGNOSIS — M5459 Other low back pain: Secondary | ICD-10-CM | POA: Diagnosis not present

## 2022-07-16 DIAGNOSIS — F330 Major depressive disorder, recurrent, mild: Secondary | ICD-10-CM | POA: Diagnosis not present

## 2022-07-17 DIAGNOSIS — R1319 Other dysphagia: Secondary | ICD-10-CM | POA: Diagnosis not present

## 2022-07-17 DIAGNOSIS — M6281 Muscle weakness (generalized): Secondary | ICD-10-CM | POA: Diagnosis not present

## 2022-07-17 DIAGNOSIS — M5459 Other low back pain: Secondary | ICD-10-CM | POA: Diagnosis not present

## 2022-07-17 DIAGNOSIS — R2681 Unsteadiness on feet: Secondary | ICD-10-CM | POA: Diagnosis not present

## 2022-07-19 DIAGNOSIS — M6281 Muscle weakness (generalized): Secondary | ICD-10-CM | POA: Diagnosis not present

## 2022-07-19 DIAGNOSIS — M5459 Other low back pain: Secondary | ICD-10-CM | POA: Diagnosis not present

## 2022-07-19 DIAGNOSIS — R2681 Unsteadiness on feet: Secondary | ICD-10-CM | POA: Diagnosis not present

## 2022-07-19 DIAGNOSIS — R1319 Other dysphagia: Secondary | ICD-10-CM | POA: Diagnosis not present

## 2022-07-23 DIAGNOSIS — R2681 Unsteadiness on feet: Secondary | ICD-10-CM | POA: Diagnosis not present

## 2022-07-23 DIAGNOSIS — M5459 Other low back pain: Secondary | ICD-10-CM | POA: Diagnosis not present

## 2022-07-23 DIAGNOSIS — M6281 Muscle weakness (generalized): Secondary | ICD-10-CM | POA: Diagnosis not present

## 2022-07-23 DIAGNOSIS — R1319 Other dysphagia: Secondary | ICD-10-CM | POA: Diagnosis not present

## 2022-07-24 DIAGNOSIS — M5459 Other low back pain: Secondary | ICD-10-CM | POA: Diagnosis not present

## 2022-07-24 DIAGNOSIS — R2681 Unsteadiness on feet: Secondary | ICD-10-CM | POA: Diagnosis not present

## 2022-07-24 DIAGNOSIS — M6281 Muscle weakness (generalized): Secondary | ICD-10-CM | POA: Diagnosis not present

## 2022-07-24 DIAGNOSIS — R1319 Other dysphagia: Secondary | ICD-10-CM | POA: Diagnosis not present

## 2022-07-26 DIAGNOSIS — M6281 Muscle weakness (generalized): Secondary | ICD-10-CM | POA: Diagnosis not present

## 2022-07-26 DIAGNOSIS — M5459 Other low back pain: Secondary | ICD-10-CM | POA: Diagnosis not present

## 2022-07-26 DIAGNOSIS — R2681 Unsteadiness on feet: Secondary | ICD-10-CM | POA: Diagnosis not present

## 2022-07-26 DIAGNOSIS — R1319 Other dysphagia: Secondary | ICD-10-CM | POA: Diagnosis not present

## 2022-07-31 DIAGNOSIS — R1319 Other dysphagia: Secondary | ICD-10-CM | POA: Diagnosis not present

## 2022-07-31 DIAGNOSIS — R2681 Unsteadiness on feet: Secondary | ICD-10-CM | POA: Diagnosis not present

## 2022-07-31 DIAGNOSIS — M6281 Muscle weakness (generalized): Secondary | ICD-10-CM | POA: Diagnosis not present

## 2022-07-31 DIAGNOSIS — M5459 Other low back pain: Secondary | ICD-10-CM | POA: Diagnosis not present

## 2022-08-01 ENCOUNTER — Telehealth: Payer: Self-pay

## 2022-08-01 ENCOUNTER — Other Ambulatory Visit: Payer: Self-pay | Admitting: Family Medicine

## 2022-08-01 NOTE — Telephone Encounter (Signed)
Patient called and states that her power wheelchair is not working anymore. She states she needs PCP Frederica Kuster, MD to write another prescription and have it faxed to [facility].

## 2022-08-02 ENCOUNTER — Other Ambulatory Visit: Payer: Self-pay

## 2022-08-02 DIAGNOSIS — M5459 Other low back pain: Secondary | ICD-10-CM | POA: Diagnosis not present

## 2022-08-02 DIAGNOSIS — R269 Unspecified abnormalities of gait and mobility: Secondary | ICD-10-CM

## 2022-08-02 DIAGNOSIS — R1319 Other dysphagia: Secondary | ICD-10-CM | POA: Diagnosis not present

## 2022-08-02 DIAGNOSIS — M6281 Muscle weakness (generalized): Secondary | ICD-10-CM | POA: Diagnosis not present

## 2022-08-02 DIAGNOSIS — R2681 Unsteadiness on feet: Secondary | ICD-10-CM | POA: Diagnosis not present

## 2022-08-02 NOTE — Progress Notes (Signed)
Order has been pended for Dr. Hyacinth Meeker to add diagnosis and sign. Please let the CMA in clinical intake know when the prescription has been signed to be printed and faxed to [facility] supply.

## 2022-08-02 NOTE — Telephone Encounter (Signed)
Daughter called requesting the Power Wheelchair order to go to: [facility] Fax: [phone]

## 2022-08-03 NOTE — Telephone Encounter (Signed)
Order faxed to [facility]

## 2022-08-07 DIAGNOSIS — M5459 Other low back pain: Secondary | ICD-10-CM | POA: Diagnosis not present

## 2022-08-07 DIAGNOSIS — R2681 Unsteadiness on feet: Secondary | ICD-10-CM | POA: Diagnosis not present

## 2022-08-07 DIAGNOSIS — M6281 Muscle weakness (generalized): Secondary | ICD-10-CM | POA: Diagnosis not present

## 2022-08-09 DIAGNOSIS — M5459 Other low back pain: Secondary | ICD-10-CM | POA: Diagnosis not present

## 2022-08-09 DIAGNOSIS — M6281 Muscle weakness (generalized): Secondary | ICD-10-CM | POA: Diagnosis not present

## 2022-08-09 DIAGNOSIS — R2681 Unsteadiness on feet: Secondary | ICD-10-CM | POA: Diagnosis not present

## 2022-08-13 DIAGNOSIS — F330 Major depressive disorder, recurrent, mild: Secondary | ICD-10-CM | POA: Diagnosis not present

## 2022-08-14 DIAGNOSIS — M5459 Other low back pain: Secondary | ICD-10-CM | POA: Diagnosis not present

## 2022-08-14 DIAGNOSIS — R2681 Unsteadiness on feet: Secondary | ICD-10-CM | POA: Diagnosis not present

## 2022-08-14 DIAGNOSIS — M6281 Muscle weakness (generalized): Secondary | ICD-10-CM | POA: Diagnosis not present

## 2022-08-16 DIAGNOSIS — M6281 Muscle weakness (generalized): Secondary | ICD-10-CM | POA: Diagnosis not present

## 2022-08-16 DIAGNOSIS — M5459 Other low back pain: Secondary | ICD-10-CM | POA: Diagnosis not present

## 2022-08-16 DIAGNOSIS — R2681 Unsteadiness on feet: Secondary | ICD-10-CM | POA: Diagnosis not present

## 2022-08-21 DIAGNOSIS — R2681 Unsteadiness on feet: Secondary | ICD-10-CM | POA: Diagnosis not present

## 2022-08-21 DIAGNOSIS — M6281 Muscle weakness (generalized): Secondary | ICD-10-CM | POA: Diagnosis not present

## 2022-08-21 DIAGNOSIS — M5459 Other low back pain: Secondary | ICD-10-CM | POA: Diagnosis not present

## 2022-08-22 ENCOUNTER — Encounter: Payer: Self-pay | Admitting: Nurse Practitioner

## 2022-08-22 ENCOUNTER — Non-Acute Institutional Stay: Payer: Medicare PPO | Admitting: Nurse Practitioner

## 2022-08-22 DIAGNOSIS — M159 Polyosteoarthritis, unspecified: Secondary | ICD-10-CM

## 2022-08-22 DIAGNOSIS — E785 Hyperlipidemia, unspecified: Secondary | ICD-10-CM

## 2022-08-22 DIAGNOSIS — R1310 Dysphagia, unspecified: Secondary | ICD-10-CM | POA: Diagnosis not present

## 2022-08-22 DIAGNOSIS — K219 Gastro-esophageal reflux disease without esophagitis: Secondary | ICD-10-CM | POA: Diagnosis not present

## 2022-08-22 DIAGNOSIS — N3942 Incontinence without sensory awareness: Secondary | ICD-10-CM

## 2022-08-22 DIAGNOSIS — K5901 Slow transit constipation: Secondary | ICD-10-CM | POA: Diagnosis not present

## 2022-08-22 DIAGNOSIS — F418 Other specified anxiety disorders: Secondary | ICD-10-CM

## 2022-08-22 DIAGNOSIS — M810 Age-related osteoporosis without current pathological fracture: Secondary | ICD-10-CM | POA: Diagnosis not present

## 2022-08-22 DIAGNOSIS — I63331 Cerebral infarction due to thrombosis of right posterior cerebral artery: Secondary | ICD-10-CM

## 2022-08-22 DIAGNOSIS — R269 Unspecified abnormalities of gait and mobility: Secondary | ICD-10-CM | POA: Diagnosis not present

## 2022-08-22 DIAGNOSIS — I10 Essential (primary) hypertension: Secondary | ICD-10-CM | POA: Diagnosis not present

## 2022-08-22 NOTE — Assessment & Plan Note (Signed)
takes Atorvastatin, LDL 58 10/19/21, update lipids

## 2022-08-22 NOTE — Progress Notes (Signed)
Location:  [facility] Room Number: [id number]/A Place of Service:  [id number]) Provider:  Arlean Thies X, NP Frederica Kuster, MD  Patient Care Team: Frederica Kuster, MD as PCP - General (Family Medicine) [name], MD as PCP - Cardiology (Cardiology) Saidee Geremia X, NP as Nurse Practitioner (Internal Medicine) Marzella Schlein., MD (Ophthalmology) Arminda Resides, MD as Consulting Physician (Dermatology) Arminda Resides, MD as Consulting Physician (Dermatology) Teodora Medici, MD as Consulting Physician (Gynecology)  Extended Emergency Contact Information Primary Emergency Contact: Aline Brochure [place] Phone: [phone] Relation: Daughter Secondary Emergency Contact: Black,Joel Mobile Phone: [phone] Relation: Son  Code Status:  DNR Goals of care: Advanced Directive information    08/22/2022    9:06 AM  Advanced Directives  Does Patient Have a Medical Advance Directive? Yes  Type of Advance Directive Living will;Out of facility DNR (pink MOST or yellow form)  Does patient want to make changes to medical advance directive? No - Patient declined     Chief Complaint  Patient presents with  . Medical Management of Chronic Issues    Patient is here for a follow up for chronic conditions Patient is due for Tdap vaccine    HPI:  Adriana [name] is a 88 y.o. female seen today for medical management of chronic diseases.     CTA 01/27/22 showed multiple subsegmental pulmonary emboli and pericardial effusion identified, on Eliquis, needs f/u Cardiology. EF 65-70% 01/27/22. Venous US BLE no DVT.              HTN, takes Amlodipine, Metoprolol. Bun/creat 14/0.81 01/28/22             Constipation, takes Colace qd.                                   GERD, takes Pantoprazole,  Hgb 12.9 01/28/22  Dysphagia, sometimes coughs when swallowing, working with ST.              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 Takes Plavix. Atorvastatin. affected gait(balance issues and vision issues(seeing Ophthalmology), feels weak left leg.              Gait abnormality, falls, uses walker, power w/c             Chronic occasional dry cough for about a year, left side throat tickles sometimes during eating, then cough, ? Resultant of CVA.              OP, takes Fosamax, Ca, Vit D, t score -2.3 10/25/20             Depression, takes Citalopram, some early am awakes, f/u psycho therapy             Pericardial effusion, Hx of it, present again on CTA 01/27/22             Hyperlipidemia, takes Atorvastatin, LDL 58 10/19/21             OA pain, Using walker, power w/c             Urinary incontinence, progressing   Past Medical History:  Diagnosis Date  . Anginal pain (HCC) 08/24/2010   Echo-EF =>55%,LV normal  . Arthritis   . Cataract 12/2006   Both eyes  Epes MD  . Chest pain, unspecified 09/04/2007  Lexiscan-- EF 72%; LV normal  . Constipation    Takes flax seed and honey .  Smooth Move tea.  . Depression   . Diverticulitis   . Head injury, closed    with fall  . History of cardiac cath 2012   normal coronary arteries  . Hypertension   . Mitral valve prolapse   . Osteopenia   . Stroke [facility]) 09/02/2019   Past Surgical History:  Procedure Laterality Date  . ANTERIOR CERVICAL DECOMP/DISCECTOMY FUSION N/A 10/23/2012   Procedure: Cervical Five to Cervical Six, Cervical Six to Cervical Seven anterior cervical decompression with fusion plating and bonegraft;  Surgeon: Hewitt Shorts, MD;  Location: [facility];  Service: Neurosurgery;  Laterality: N/A;  ANTERIOR CERVICAL DECOMPRESSION/DISCECTOMY FUSION 2 LEVELS  . BREAST CYST ASPIRATION  1990   benign  . CARDIAC CATHETERIZATION  08/01/2010   normal coronaries  . COLONOSCOPY  approx 2011  . EYE SURGERY Bilateral 2011  . intestinal blockage surgery  Feb. 2012   "kink in small intestine" per Adriana [name]     Allergies  Allergen Reactions  . Contrast Media [Iodinated Contrast Media] Hives    Hives during IVP at urology office '02, requires 13 hr prep///a.[email or web address]  Patient came thru ER and had a 1 hour pre-medication and did fine  . Oxycontin [Oxycodone] Other (See Comments)    Decreased appetite.  Sandrea Hammond [Ibandronic Acid] Nausea And Vomiting and Other (See Comments)    Weakness   . Codeine Diarrhea and Nausea And Vomiting  . Darvocet [Propoxyphene N-Acetaminophen] Diarrhea and Nausea And Vomiting  . Erythromycin Nausea And Vomiting    Outpatient Encounter Medications as of 08/22/2022  Medication Sig  . acetaminophen (TYLENOL) 325 MG tablet Take 650 mg by mouth at bedtime. 2 entries: 650 mg every 4 hours as needed  . alendronate (FOSAMAX) 70 MG tablet TAKE 1 TAB ONCE A WEEK, AT LEAST 30 MIN BEFORE 1ST FOOD.DO NOT LIE DOWN FOR 30 MIN AFTER TAKING.  Marland Kitchen amLODipine (NORVASC) 5 MG tablet Take 5 mg by mouth in the morning.  Marland Kitchen apixaban (ELIQUIS) 5 MG TABS tablet Take 2.5 mg by mouth 2 (two) times daily.  Marland Kitchen atorvastatin (LIPITOR) 20 MG tablet TAKE ONE TABLET BY MOUTH ONCE DAILY.  . Calcium Carbonate-Vitamin D (CALCIUM 600 +D HIGH POTENCY) 600-10 MG-MCG TABS Take 1 tablet by mouth in the morning.  . citalopram (CELEXA) 20 MG tablet Take 1 tablet (20 mg total) by mouth daily.  . clopidogrel (PLAVIX) 75 MG tablet TAKE 1 TABLET ONCE DAILY.  Marland Kitchen diclofenac Sodium (VOLTAREN) 1 % GEL Apply 1 g topically 2 (two) times daily.  Marland Kitchen docusate sodium (COLACE) 100 MG capsule Take 100 mg by mouth at bedtime.  . metoprolol tartrate (LOPRESSOR) 25 MG tablet Take 12.5 mg by mouth 2 (two) times daily.  . pantoprazole (PROTONIX) 40 MG tablet TAKE 1 TABLET ONCE DAILY.   No facility-administered encounter medications on file as of 08/22/2022.    Review of Systems  Constitutional:  Negative for activity change, appetite change and fever.  HENT:  Positive for hearing loss. Negative for congestion and trouble  swallowing.   Eyes:  Negative for visual disturbance.  Respiratory:  Positive for cough. Negative for chest tightness, shortness of breath and wheezing.        Chronic occasional cough with clear phlegm. DOE since 01/27/22  Cardiovascular:  Negative for chest pain, palpitations and leg swelling.  Gastrointestinal:  Negative for abdominal pain and constipation.  Genitourinary:  Negative  for dysuria, frequency and urgency.  Musculoskeletal:  Positive for arthralgias, back pain and gait problem.  Skin:  Negative for color change.  Neurological:  Positive for weakness. Negative for speech difficulty and headaches.       Memory lapses occasionally. Left sided weakness.   Psychiatric/Behavioral:  Positive for sleep disturbance. Negative for behavioral problems. The patient is not nervous/anxious.     Immunization History  Administered Date(s) Administered  . Fluad Quad(high Dose 65+) 12/17/2018  . Influenza, High Dose Seasonal PF 12/08/2016, 12/19/2020  . Influenza-Unspecified 12/16/2019, 12/25/2021  . Moderna Sars-Covid-2 Vaccination 03/09/2019, 04/06/2019, 01/12/2020  . [employer], [employer] 6yrs & up 01/04/2022  . Pneumococcal Conjugate-13 06/12/2013  . Pneumococcal Polysaccharide-23 07/28/2002  . RSV,unspecified 04/05/2022  . Tdap 07/27/2009  . Zoster Recombinat (Shingrix) 01/08/2017, 06/03/2017   Pertinent  Health Maintenance Due  Topic Date Due  . INFLUENZA VACCINE  10/04/2022  . DEXA SCAN  Completed      01/27/2022   10:25 AM 01/28/2022    8:15 AM 03/13/2022    9:11 AM 03/16/2022    2:03 PM 08/22/2022    9:06 AM  Fall Risk  Falls in the past year?   0 0 0  Was there an injury with Fall?   0 0 0  Fall Risk Category Calculator   0 0 0  Fall Risk Category (Retired)   Low Low   (RETIRED) Patient Fall Risk Level Low fall risk Moderate fall risk Low fall risk Low fall risk   Patient at Risk for Falls Due to   No Fall Risks No Fall Risks No Fall Risks  Fall  risk Follow up   Falls evaluation completed Falls evaluation completed    Functional Status Survey:    Vitals:   08/22/22 0902  BP: 124/72  Pulse: 66  Resp: 18  Temp: (!) 97 F (36.1 C)  SpO2: 95%  Weight: 149 lb 6.4 oz (67.8 kg)  Height: 5\' 6"  (1.676 m)   Body mass index is 24.11 kg/m. Physical Exam Vitals and nursing note reviewed.  Constitutional:      Appearance: Normal appearance.  HENT:     Head: Normocephalic and atraumatic.     Mouth/Throat:     Mouth: Mucous membranes are moist.  Eyes:     Extraocular Movements: Extraocular movements intact.     Conjunctiva/sclera: Conjunctivae normal.     Pupils: Pupils are equal, round, and reactive to light.     Comments: Lateral right eye peripheral vision field loss since CVA 11/2018  Cardiovascular:     Rate and Rhythm: Normal rate and regular rhythm.     Heart sounds: No murmur heard. Pulmonary:     Effort: Pulmonary effort is normal.     Breath sounds: No rales.  Abdominal:     General: Bowel sounds are normal.     Palpations: Abdomen is soft.     Tenderness: There is no abdominal tenderness.  Musculoskeletal:     Cervical back: Normal range of motion and neck supple.     Right lower leg: No edema.     Left lower leg: No edema.     Comments: Kyphoscoliosis. Left shoulder over head ROM decreased.   Skin:    General: Skin is warm and dry.  Neurological:     General: No focal deficit present.     Mental Status: She is alert and oriented to person, place, and time. Mental status is at baseline.     Cranial  Nerves: No cranial nerve deficit.     Motor: Weakness present.     Coordination: Coordination normal.     Gait: Gait abnormal.     Deep Tendon Reflexes: Reflexes normal.     Comments: Walker for ambulation. Left sided weakness with muscle strength 5/5.  Psychiatric:        Mood and Affect: Mood normal.        Behavior: Behavior normal.    Labs reviewed: Recent Labs    01/26/22 2223 01/28/22 0427  NA 138  140  K 3.3* 3.7  CL 104 108  CO2 25 24  GLUCOSE 125* 108*  BUN 13 14  CREATININE 0.86 0.81  CALCIUM 9.6 9.0   No results for input(s): "AST", "ALT", "ALKPHOS", "BILITOT", "PROT", "ALBUMIN" in the last 8760 hours. Recent Labs    10/19/21 0729 01/26/22 2223 01/28/22 0427  WBC 5.4 11.9* 11.4*  NEUTROABS 3,397  --   --   HGB 13.0 13.1 12.9  HCT 38.0 38.5 38.0  MCV 92.0 93.2 93.4  PLT 239 233 225   Lab Results  Component Value Date   TSH 4.05 06/30/2019   Lab Results  Component Value Date   HGBA1C 5.3 09/03/2019   Lab Results  Component Value Date   CHOL 115 10/19/2021   HDL 43 (L) 10/19/2021   LDLCALC 58 10/19/2021   TRIG 67 10/19/2021   CHOLHDL 2.7 10/19/2021    Significant Diagnostic Results in last 30 days:  No results found.  Assessment/Plan Hypertension Blood pressure is controlled, takes Amlodipine, Metoprolol. Bun/creat 14/0.81 01/28/22, update CMP/eGFR  Slow transit constipation Stable,  takes Colace qd.      GERD (gastroesophageal reflux disease)  takes Pantoprazole,  Hgb 12.9 01/28/22, update CBC/diff  Dysphagia sometimes coughs when swallowing, working with ST.   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 Takes Plavix. Atorvastatin. affected gait(balance issues and vision issues(seeing Ophthalmology), feels weak left leg.  Gait abnormality  falls, uses walker, power w/c  Osteoporosis takes Fosamax, Ca, Vit D, t score -2.3 10/25/20  Depression with anxiety  takes Citalopram, some early am awakes, f/u psycho therapy, MMSE 30/30 07/03/22, update TSH  Hyperlipidemia  takes Atorvastatin, LDL 58 10/19/21, update lipids  Generalized osteoarthritis of multiple sites Using walker, power w/c  Incontinent of urine progressing     Family/ staff Communication: plan of care reviewed with the patient and charge nurse.   Labs/tests  ordered:  CBC/diff, CMP/eGFR, TSH, lipid panel.   Time spend 40 minutes.

## 2022-08-22 NOTE — Assessment & Plan Note (Signed)
Using walker, power w/c

## 2022-08-22 NOTE — Assessment & Plan Note (Signed)
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 Takes Plavix. Atorvastatin. affected gait(balance issues and vision issues(seeing Ophthalmology), feels weak left leg.

## 2022-08-22 NOTE — Assessment & Plan Note (Signed)
CTA 01/27/22 showed multiple subsegmental pulmonary emboli and pericardial effusion identified, on Eliquis, needs f/u Cardiology. EF 65-70% 01/27/22. Venous US BLE no DVT.  

## 2022-08-22 NOTE — Assessment & Plan Note (Signed)
progressing 

## 2022-08-22 NOTE — Assessment & Plan Note (Signed)
sometimes coughs when swallowing, working with ST.

## 2022-08-22 NOTE — Assessment & Plan Note (Signed)
takes Fosamax, Ca, Vit D, t score -2.3 10/25/20 

## 2022-08-22 NOTE — Assessment & Plan Note (Signed)
takes Citalopram, some early am awakes, f/u psycho therapy, MMSE 30/30 07/03/22, update TSH

## 2022-08-22 NOTE — Assessment & Plan Note (Signed)
takes Pantoprazole,  Hgb 12.9 01/28/22, update CBC/diff

## 2022-08-22 NOTE — Assessment & Plan Note (Signed)
Stable, takes Colace qd 

## 2022-08-22 NOTE — Assessment & Plan Note (Signed)
falls, uses walker, power w/c 

## 2022-08-22 NOTE — Assessment & Plan Note (Signed)
Blood pressure is controlled, takes Amlodipine, Metoprolol. Bun/creat 14/0.81 01/28/22, update CMP/eGFR

## 2022-08-23 DIAGNOSIS — R0602 Shortness of breath: Secondary | ICD-10-CM | POA: Diagnosis not present

## 2022-08-23 DIAGNOSIS — Z13220 Encounter for screening for lipoid disorders: Secondary | ICD-10-CM | POA: Diagnosis not present

## 2022-08-23 DIAGNOSIS — M5459 Other low back pain: Secondary | ICD-10-CM | POA: Diagnosis not present

## 2022-08-23 DIAGNOSIS — M6281 Muscle weakness (generalized): Secondary | ICD-10-CM | POA: Diagnosis not present

## 2022-08-23 DIAGNOSIS — Z136 Encounter for screening for cardiovascular disorders: Secondary | ICD-10-CM | POA: Diagnosis not present

## 2022-08-23 DIAGNOSIS — R2681 Unsteadiness on feet: Secondary | ICD-10-CM | POA: Diagnosis not present

## 2022-08-23 LAB — HEPATIC FUNCTION PANEL
ALT: 11 U/L (ref 7–35)
AST: 19 (ref 13–35)
Alkaline Phosphatase: 62 (ref 25–125)
Bilirubin, Total: 0.6

## 2022-08-23 LAB — BASIC METABOLIC PANEL
BUN: 16 (ref 4–21)
CO2: 31 — AB (ref 13–22)
Chloride: 103 (ref 99–108)
Creatinine: 0.9 (ref 0.5–1.1)
Glucose: 102
Potassium: 3.9 meq/L (ref 3.5–5.1)
Sodium: 140 (ref 137–147)

## 2022-08-23 LAB — CBC AND DIFFERENTIAL
HCT: 40 (ref 36–46)
Hemoglobin: 13.3 (ref 12.0–16.0)
Neutrophils Absolute: 2875
Platelets: 234 10*3/uL (ref 150–400)
WBC: 5

## 2022-08-23 LAB — COMPREHENSIVE METABOLIC PANEL
Albumin: 4.4 (ref 3.5–5.0)
Calcium: 9.8 (ref 8.7–10.7)
Globulin: 2.7
eGFR: 62

## 2022-08-23 LAB — LIPID PANEL
Cholesterol: 124 (ref 0–200)
HDL: 51 (ref 35–70)
LDL Cholesterol: 58
LDl/HDL Ratio: 2.4
Triglycerides: 70 (ref 40–160)

## 2022-08-23 LAB — CBC: RBC: 4.3 (ref 3.87–5.11)

## 2022-08-23 LAB — TSH: TSH: 2.71 (ref 0.41–5.90)

## 2022-08-27 DIAGNOSIS — F330 Major depressive disorder, recurrent, mild: Secondary | ICD-10-CM | POA: Diagnosis not present

## 2022-08-28 ENCOUNTER — Encounter: Payer: Self-pay | Admitting: Nurse Practitioner

## 2022-08-28 ENCOUNTER — Non-Acute Institutional Stay: Payer: Medicare PPO | Admitting: Nurse Practitioner

## 2022-08-28 DIAGNOSIS — B351 Tinea unguium: Secondary | ICD-10-CM

## 2022-08-28 DIAGNOSIS — E785 Hyperlipidemia, unspecified: Secondary | ICD-10-CM

## 2022-08-28 DIAGNOSIS — R269 Unspecified abnormalities of gait and mobility: Secondary | ICD-10-CM | POA: Diagnosis not present

## 2022-08-28 DIAGNOSIS — I63331 Cerebral infarction due to thrombosis of right posterior cerebral artery: Secondary | ICD-10-CM

## 2022-08-28 DIAGNOSIS — I2782 Chronic pulmonary embolism: Secondary | ICD-10-CM | POA: Diagnosis not present

## 2022-08-28 DIAGNOSIS — R053 Chronic cough: Secondary | ICD-10-CM | POA: Diagnosis not present

## 2022-08-28 DIAGNOSIS — R1310 Dysphagia, unspecified: Secondary | ICD-10-CM

## 2022-08-28 DIAGNOSIS — K219 Gastro-esophageal reflux disease without esophagitis: Secondary | ICD-10-CM | POA: Diagnosis not present

## 2022-08-28 DIAGNOSIS — I10 Essential (primary) hypertension: Secondary | ICD-10-CM

## 2022-08-28 DIAGNOSIS — F418 Other specified anxiety disorders: Secondary | ICD-10-CM

## 2022-08-28 DIAGNOSIS — R2681 Unsteadiness on feet: Secondary | ICD-10-CM | POA: Diagnosis not present

## 2022-08-28 DIAGNOSIS — M810 Age-related osteoporosis without current pathological fracture: Secondary | ICD-10-CM

## 2022-08-28 DIAGNOSIS — M5459 Other low back pain: Secondary | ICD-10-CM | POA: Diagnosis not present

## 2022-08-28 DIAGNOSIS — M79675 Pain in left toe(s): Secondary | ICD-10-CM

## 2022-08-28 DIAGNOSIS — K5901 Slow transit constipation: Secondary | ICD-10-CM | POA: Diagnosis not present

## 2022-08-28 DIAGNOSIS — M79674 Pain in right toe(s): Secondary | ICD-10-CM

## 2022-08-28 DIAGNOSIS — M6281 Muscle weakness (generalized): Secondary | ICD-10-CM | POA: Diagnosis not present

## 2022-08-28 NOTE — Assessment & Plan Note (Signed)
CTA 01/27/22 showed multiple subsegmental pulmonary emboli and pericardial effusion identified, on Eliquis, needs f/u Cardiology. EF 65-70% 01/27/22. Venous US BLE no DVT.  

## 2022-08-28 NOTE — Assessment & Plan Note (Signed)
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 Takes Plavix. Atorvastatin. affected gait(balance issues and vision issues(seeing Ophthalmology), feels weak left leg. 

## 2022-08-28 NOTE — Assessment & Plan Note (Signed)
Chronic occasional dry cough for about a year, left side throat tickles sometimes during eating, then cough, ? Resultant of CVA.

## 2022-08-28 NOTE — Assessment & Plan Note (Addendum)
takes Pantoprazole,  Hgb 13.3 08/23/22

## 2022-08-28 NOTE — Assessment & Plan Note (Signed)
falls, uses walker, power w/c 

## 2022-08-28 NOTE — Assessment & Plan Note (Signed)
the left great toe injury, hit toe on a door while in electric w/c, toenail is cracked, dried blood under the nail, mild redness left great toe. Yellow thick toe nails noted. Protective dressing for now. F/u Podiatry to nail care.

## 2022-08-28 NOTE — Assessment & Plan Note (Signed)
sometimes coughs when swallowing, working with ST.  

## 2022-08-28 NOTE — Assessment & Plan Note (Signed)
takes Citalopram, some early am awakes, f/u psycho therapy, TSH 2.71 08/23/22

## 2022-08-28 NOTE — Assessment & Plan Note (Addendum)
Blood pressure is controlled, takes Amlodipine, Metoprolol. Bun/creat 16/0.91 08/23/22

## 2022-08-28 NOTE — Assessment & Plan Note (Signed)
Stable, takes Colace qd 

## 2022-08-28 NOTE — Assessment & Plan Note (Signed)
takes Fosamax, Ca, Vit D, t score -2.3 10/25/20 

## 2022-08-28 NOTE — Assessment & Plan Note (Signed)
takes Atorvastatin, LDL 58 08/23/22

## 2022-08-28 NOTE — Progress Notes (Signed)
Location:  [facility] Room Number: NO/904/A Place of Service:  ALF (13) Provider:  Faithann Natal X, NP   Patient Care Team: Frederica Kuster, MD as PCP - General (Family Medicine) [name], MD as PCP - Cardiology (Cardiology) Eudelia Hiltunen X, NP as Nurse Practitioner (Internal Medicine) Marzella Schlein., MD (Ophthalmology) Arminda Resides, MD as Consulting Physician (Dermatology) Arminda Resides, MD as Consulting Physician (Dermatology) Teodora Medici, MD as Consulting Physician (Gynecology)  Extended Emergency Contact Information Primary Emergency Contact: Aline Brochure [place] Phone: [phone] Relation: Daughter Secondary Emergency Contact: Black,Joel [place] Phone: [phone] Relation: Son  Code Status:  DNR Goals of care: Advanced Directive information    08/22/2022    9:06 AM  Advanced Directives  Does Patient Have a Medical Advance Directive? Yes  Type of Advance Directive Living will;Out of facility DNR (pink MOST or yellow form)  Does patient want to make changes to medical advance directive? No - Patient declined     Chief Complaint  Patient presents with   Acute Visit    Patient is being seen for toe pain   Quality Metric Gaps    Patient is also due for Tdap vaccine    HPI:  Pt is a 88 y.o. female seen today for an acute visit for the left great toe injury, hit toe on a door while in electric w/c, toenail is cracked, dried blood under the nail, mild redness left great toe. Yellow thick toe nails noted.      CTA 01/27/22 showed multiple subsegmental pulmonary emboli and pericardial effusion identified, on Eliquis, needs f/u Cardiology. EF 65-70% 01/27/22. Venous US BLE no DVT.              HTN, takes Amlodipine, Metoprolol. Bun/creat 16/0.91 08/23/22             Constipation, takes Colace qd.                                   GERD, takes Pantoprazole,  Hgb 13.3 08/23/22             Dysphagia, sometimes  coughs when swallowing, working with ST.              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 Takes Plavix. Atorvastatin. affected gait(balance issues and vision issues(seeing Ophthalmology), feels weak left leg.              Gait abnormality, falls, uses walker, power w/c             Chronic occasional dry cough for about a year, left side throat tickles sometimes during eating, then cough, ? Resultant of CVA.              OP, takes Fosamax, Ca, Vit D, t score -2.3 10/25/20             Depression, takes Citalopram, some early am awakes, f/u psycho therapy, TSH 2.71 08/23/22             Pericardial effusion, Hx of it, present again on CTA 01/27/22             Hyperlipidemia, takes Atorvastatin, LDL 58 08/23/22             OA pain, Using walker, power w/c  Urinary incontinence, progressing    Past Medical History:  Diagnosis Date   Anginal pain (HCC) 08/24/2010   Echo-EF =>55%,LV normal   Arthritis    Cataract 12/2006   Both eyes  Epes MD   Chest pain, unspecified 09/04/2007   Lexiscan-- EF 72%; LV normal   Constipation    Takes flax seed and honey .  Smooth Move tea.   Depression    Diverticulitis    Head injury, closed    with fall   History of cardiac cath 2012   normal coronary arteries   Hypertension    Mitral valve prolapse    Osteopenia    Stroke (HCC) 09/02/2019   Past Surgical History:  Procedure Laterality Date   ANTERIOR CERVICAL DECOMP/DISCECTOMY FUSION N/A 10/23/2012   Procedure: Cervical Five to Cervical Six, Cervical Six to Cervical Seven anterior cervical decompression with fusion plating and bonegraft;  Surgeon: Hewitt Shorts, MD;  Location: [facility];  Service: Neurosurgery;  Laterality: N/A;  ANTERIOR CERVICAL DECOMPRESSION/DISCECTOMY FUSION 2 LEVELS   BREAST CYST ASPIRATION  1990   benign   CARDIAC CATHETERIZATION  08/01/2010   normal coronaries   COLONOSCOPY   approx 2011   EYE SURGERY Bilateral 2011   intestinal blockage surgery  Feb. 2012   "kink in small intestine" per pt    Allergies  Allergen Reactions   Contrast Media [Iodinated Contrast Media] Hives    Hives during IVP at urology office '02, requires 13 hr prep///[email or web address]  Patient came thru ER and had a 1 hour pre-medication and did fine   Oxycontin [Oxycodone] Other (See Comments)    Decreased appetite.   Boniva [Ibandronic Acid] Nausea And Vomiting and Other (See Comments)    Weakness    Codeine Diarrhea and Nausea And Vomiting   Darvocet [Propoxyphene N-Acetaminophen] Diarrhea and Nausea And Vomiting   Erythromycin Nausea And Vomiting    Outpatient Encounter Medications as of 08/28/2022  Medication Sig   acetaminophen (TYLENOL) 325 MG tablet Take 650 mg by mouth at bedtime. 2 entries: 650 mg every 4 hours as needed   alendronate (FOSAMAX) 70 MG tablet TAKE 1 TAB ONCE A WEEK, AT LEAST 30 MIN BEFORE 1ST FOOD.DO NOT LIE DOWN FOR 30 MIN AFTER TAKING.   amLODipine (NORVASC) 5 MG tablet Take 5 mg by mouth in the morning.   apixaban (ELIQUIS) 5 MG TABS tablet Take 2.5 mg by mouth 2 (two) times daily.   atorvastatin (LIPITOR) 20 MG tablet TAKE ONE TABLET BY MOUTH ONCE DAILY.   Calcium Carbonate-Vitamin D (CALCIUM 600 +D HIGH POTENCY) 600-10 MG-MCG TABS Take 1 tablet by mouth in the morning.   citalopram (CELEXA) 20 MG tablet Take 1 tablet (20 mg total) by mouth daily.   clopidogrel (PLAVIX) 75 MG tablet TAKE 1 TABLET ONCE DAILY.   diclofenac Sodium (VOLTAREN) 1 % GEL Apply 1 g topically 2 (two) times daily.   docusate sodium (COLACE) 100 MG capsule Take 100 mg by mouth at bedtime.   metoprolol tartrate (LOPRESSOR) 25 MG tablet Take 12.5 mg by mouth 2 (two) times daily.   pantoprazole (PROTONIX) 40 MG tablet TAKE 1 TABLET ONCE DAILY.   No facility-administered encounter medications on file as of 08/28/2022.    Review of Systems  Constitutional:  Negative for activity change,  appetite change and fever.  HENT:  Positive for hearing loss. Negative for congestion and trouble swallowing.   Eyes:  Negative for visual disturbance.  Respiratory:  Positive for cough. Negative for chest  tightness, shortness of breath and wheezing.        Chronic occasional cough with clear phlegm. DOE since 01/27/22  Cardiovascular:  Negative for leg swelling.  Gastrointestinal:  Negative for abdominal pain and constipation.  Genitourinary:  Negative for dysuria, frequency and urgency.  Musculoskeletal:  Positive for arthralgias, back pain and gait problem.  Skin:  Positive for wound. Negative for color change.  Neurological:  Positive for weakness. Negative for speech difficulty and headaches.       Memory lapses occasionally. Left sided weakness.   Psychiatric/Behavioral:  Positive for sleep disturbance. Negative for behavioral problems. The patient is not nervous/anxious.     Immunization History  Administered Date(s) Administered   Fluad Quad(high Dose 65+) 12/17/2018   Influenza, High Dose Seasonal PF 12/08/2016, 12/19/2020   Influenza-Unspecified 12/16/2019, 12/25/2021   Moderna Sars-Covid-2 Vaccination 03/09/2019, 04/06/2019, 01/12/2020   Pfizer Covid-19 Vaccine Bivalent Booster 72yrs & up 01/04/2022   Pneumococcal Conjugate-13 06/12/2013   Pneumococcal Polysaccharide-23 07/28/2002   RSV,unspecified 04/05/2022   Tdap 07/27/2009   Zoster Recombinat (Shingrix) 01/08/2017, 06/03/2017   Pertinent  Health Maintenance Due  Topic Date Due   INFLUENZA VACCINE  10/04/2022   DEXA SCAN  Completed      01/27/2022   10:25 AM 01/28/2022    8:15 AM 03/13/2022    9:11 AM 03/16/2022    2:03 PM 08/22/2022    9:06 AM  Fall Risk  Falls in the past year?   0 0 0  Was there an injury with Fall?   0 0 0  Fall Risk Category Calculator   0 0 0  Fall Risk Category (Retired)   Low Low   (RETIRED) Patient Fall Risk Level Low fall risk Moderate fall risk Low fall risk Low fall risk   Patient  at Risk for Falls Due to   No Fall Risks No Fall Risks No Fall Risks  Fall risk Follow up   Falls evaluation completed Falls evaluation completed    Functional Status Survey:    Vitals:   08/28/22 0826  BP: (!) 140/70  Pulse: 79  Resp: 16  Temp: (!) 97.4 F (36.3 C)  SpO2: 98%  Weight: 149 lb 6.4 oz (67.8 kg)  Height: 5\' 6"  (1.676 m)   Body mass index is 24.11 kg/m. Physical Exam Vitals and nursing note reviewed.  Constitutional:      Appearance: Normal appearance.  HENT:     Head: Normocephalic and atraumatic.     Mouth/Throat:     Mouth: Mucous membranes are moist.  Eyes:     Extraocular Movements: Extraocular movements intact.     Conjunctiva/sclera: Conjunctivae normal.     Pupils: Pupils are equal, round, and reactive to light.     Comments: Lateral right eye peripheral vision field loss since CVA 11/2018  Cardiovascular:     Rate and Rhythm: Normal rate and regular rhythm.     Heart sounds: No murmur heard. Pulmonary:     Effort: Pulmonary effort is normal.     Breath sounds: No rales.  Abdominal:     General: Bowel sounds are normal.     Palpations: Abdomen is soft.     Tenderness: There is no abdominal tenderness.  Musculoskeletal:     Cervical back: Normal range of motion and neck supple.     Right lower leg: No edema.     Left lower leg: No edema.     Comments: Kyphoscoliosis. Left shoulder over head ROM decreased.   Skin:  General: Skin is warm and dry.     Comments:  the left great toe injury, hit toe on a door while in electric w/c, toenail is cracked, dried blood under the nail, mild redness left great toe. Yellow thick toe nails noted.    Neurological:     General: No focal deficit present.     Mental Status: She is alert and oriented to person, place, and time. Mental status is at baseline.     Cranial Nerves: No cranial nerve deficit.     Motor: Weakness present.     Coordination: Coordination normal.     Gait: Gait abnormal.     Deep Tendon  Reflexes: Reflexes normal.     Comments: Walker for ambulation. Left sided weakness with muscle strength 5/5.  Psychiatric:        Mood and Affect: Mood normal.        Behavior: Behavior normal.     Labs reviewed: Recent Labs    01/26/22 2223 01/28/22 0427  NA 138 140  K 3.3* 3.7  CL 104 108  CO2 25 24  GLUCOSE 125* 108*  BUN 13 14  CREATININE 0.86 0.81  CALCIUM 9.6 9.0   No results for input(s): "AST", "ALT", "ALKPHOS", "BILITOT", "PROT", "ALBUMIN" in the last 8760 hours. Recent Labs    10/19/21 0729 01/26/22 2223 01/28/22 0427  WBC 5.4 11.9* 11.4*  NEUTROABS 3,397  --   --   HGB 13.0 13.1 12.9  HCT 38.0 38.5 38.0  MCV 92.0 93.2 93.4  PLT 239 233 225   Lab Results  Component Value Date   TSH 4.05 06/30/2019   Lab Results  Component Value Date   HGBA1C 5.3 09/03/2019   Lab Results  Component Value Date   CHOL 115 10/19/2021   HDL 43 (L) 10/19/2021   LDLCALC 58 10/19/2021   TRIG 67 10/19/2021   CHOLHDL 2.7 10/19/2021    Significant Diagnostic Results in last 30 days:  No results found.  Assessment/Plan Pain due to onychomycosis of toenails of both feet  the left great toe injury, hit toe on a door while in electric w/c, toenail is cracked, dried blood under the nail, mild redness left great toe. Yellow thick toe nails noted. Protective dressing for now. F/u Podiatry to nail care.   Pulmonary embolus (HCC)  CTA 01/27/22 showed multiple subsegmental pulmonary emboli and pericardial effusion identified, on Eliquis, needs f/u Cardiology. EF 65-70% 01/27/22. Venous US BLE no DVT.   Hypertension Blood pressure is controlled, takes Amlodipine, Metoprolol. Bun/creat 16/0.91 08/23/22  Slow transit constipation Stable, takes Colace qd.    GERD (gastroesophageal reflux disease)  takes Pantoprazole,  Hgb 13.3 08/23/22  Dysphagia sometimes coughs when swallowing, working with ST.   CVA (cerebral vascular accident) (HCC) CVA 2nd to thrombosis of right  posterior cerebral artery(hospitalized 6/30-7/3), Hx of CVA left occipital cortex 9/20. Takes Plavix. Atorvastatin. affected gait(balance issues and vision issues(seeing Ophthalmology), feels weak left leg.   Gait abnormality falls, uses walker, power w/c  Cough Chronic occasional dry cough for about a year, left side throat tickles sometimes during eating, then cough, ? Resultant of CVA.   Osteoporosis  takes Fosamax, Ca, Vit D, t score -2.3 10/25/20  Depression with anxiety  takes Citalopram, some early am awakes, f/u psycho therapy, TSH 2.71 08/23/22  Hyperlipidemia  takes Atorvastatin, LDL 58 08/23/22     Family/ staff Communication: plan of care reviewed with the patient and charge nurse.   Labs/tests ordered:  none  Time spend 40 minutes.

## 2022-08-30 DIAGNOSIS — M5459 Other low back pain: Secondary | ICD-10-CM | POA: Diagnosis not present

## 2022-08-30 DIAGNOSIS — R2681 Unsteadiness on feet: Secondary | ICD-10-CM | POA: Diagnosis not present

## 2022-08-30 DIAGNOSIS — M6281 Muscle weakness (generalized): Secondary | ICD-10-CM | POA: Diagnosis not present

## 2022-09-10 ENCOUNTER — Telehealth: Payer: Self-pay

## 2022-09-10 NOTE — Telephone Encounter (Signed)
Patient is calling because she has some questions about her paperwork

## 2022-09-11 DIAGNOSIS — L814 Other melanin hyperpigmentation: Secondary | ICD-10-CM | POA: Diagnosis not present

## 2022-09-11 DIAGNOSIS — L570 Actinic keratosis: Secondary | ICD-10-CM | POA: Diagnosis not present

## 2022-09-11 DIAGNOSIS — L853 Xerosis cutis: Secondary | ICD-10-CM | POA: Diagnosis not present

## 2022-09-11 DIAGNOSIS — L905 Scar conditions and fibrosis of skin: Secondary | ICD-10-CM | POA: Diagnosis not present

## 2022-09-11 DIAGNOSIS — L821 Other seborrheic keratosis: Secondary | ICD-10-CM | POA: Diagnosis not present

## 2022-09-18 DIAGNOSIS — L602 Onychogryphosis: Secondary | ICD-10-CM | POA: Diagnosis not present

## 2022-09-18 DIAGNOSIS — S90212A Contusion of left great toe with damage to nail, initial encounter: Secondary | ICD-10-CM | POA: Diagnosis not present

## 2022-10-01 ENCOUNTER — Encounter: Payer: Self-pay | Admitting: Nurse Practitioner

## 2022-10-01 ENCOUNTER — Non-Acute Institutional Stay: Payer: Medicare PPO | Admitting: Nurse Practitioner

## 2022-10-01 DIAGNOSIS — I10 Essential (primary) hypertension: Secondary | ICD-10-CM

## 2022-10-01 DIAGNOSIS — I63331 Cerebral infarction due to thrombosis of right posterior cerebral artery: Secondary | ICD-10-CM | POA: Diagnosis not present

## 2022-10-01 DIAGNOSIS — F418 Other specified anxiety disorders: Secondary | ICD-10-CM

## 2022-10-01 DIAGNOSIS — B372 Candidiasis of skin and nail: Secondary | ICD-10-CM | POA: Diagnosis not present

## 2022-10-01 DIAGNOSIS — R001 Bradycardia, unspecified: Secondary | ICD-10-CM

## 2022-10-01 NOTE — Assessment & Plan Note (Signed)
CTA 01/27/22 showed multiple subsegmental pulmonary emboli and pericardial effusion identified, on Eliquis, needs f/u Cardiology. EF 65-70% 01/27/22. Venous US BLE no DVT.  

## 2022-10-01 NOTE — Assessment & Plan Note (Signed)
Stable, takes Citalopram, some early am awakes, f/u psycho therapy, TSH 2.71 08/23/22

## 2022-10-01 NOTE — Assessment & Plan Note (Signed)
chronic rash in skin folds, under breast, under arms, groins, on and off, topical Micoconazole was effective, the patient desires something for itching. Will add 0.5% Triamcinolone cream bid x 2 weeks, may repeat. Moist and heat are contributory, avoid risk factors.

## 2022-10-01 NOTE — Assessment & Plan Note (Signed)
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 Takes Plavix. Atorvastatin. affected gait(balance issues and vision issues(seeing Ophthalmology), feels weak left leg.

## 2022-10-01 NOTE — Assessment & Plan Note (Signed)
Blood pressrue is controlled, takes Amlodipine, Metoprolol. Bun/creat 16/0.91 08/23/22

## 2022-10-01 NOTE — Progress Notes (Signed)
Location:   [facility] Room Number: 904 Place of Service:  [facility] (13) Provider: Arna Snipe Erlinda Solinger NP  Frederica Kuster, MD  Patient Care Team: Frederica Kuster, MD as PCP - General (Family Medicine) [name], MD as PCP - Cardiology (Cardiology) Bryant Saye X, NP as Nurse Practitioner (Internal Medicine) Marzella Schlein., MD (Ophthalmology) Arminda Resides, MD as Consulting Physician (Dermatology) Arminda Resides, MD as Consulting Physician (Dermatology) Teodora Medici, MD as Consulting Physician (Gynecology)  Extended Emergency Contact Information Primary Emergency Contact: Aline Brochure [place] Mobile Phone: [phone] Relation: Daughter Secondary Emergency Contact: Black,Joel Mobile Phone: [phone] Relation: Son  Code Status: DNR Goals of care: Advanced Directive information    08/22/2022    9:06 AM  Advanced Directives  Does Patient Have a Medical Advance Directive? Yes  Type of Advance Directive Living will;Out of facility DNR (pink MOST or yellow form)  Does patient want to make changes to medical advance directive? No - Patient declined     Chief Complaint  Patient presents with   Acute Visit    Itching rash in skin folds.     HPI:  Pt is a 88 y.o. female seen today for an acute visit for chronic rash in skin folds, under breast, under arms, groins, on and off, topical Micoconazole was effective, the patient desires something for itching.     CTA 01/27/22 showed multiple subsegmental pulmonary emboli and pericardial effusion identified, on Eliquis, needs f/u Cardiology. EF 65-70% 01/27/22. Venous US BLE no DVT.              HTN, takes Amlodipine, Metoprolol. Bun/creat 16/0.91 08/23/22             Constipation, takes Colace qd.                                   GERD, takes Pantoprazole,  Hgb 13.3 08/23/22             Dysphagia, sometimes coughs when swallowing, working with ST.              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 Takes Plavix. Atorvastatin. affected gait(balance issues and vision issues(seeing Ophthalmology), feels weak left leg.              Gait abnormality, falls, uses walker, power w/c             Chronic occasional dry cough for about a year, left side throat tickles sometimes during eating, then cough, ? Resultant of CVA.              OP, takes Fosamax, Ca, Vit D, t score -2.3 10/25/20             Depression, takes Citalopram, some early am awakes, f/u psycho therapy, TSH 2.71 08/23/22             Pericardial effusion, Hx of it, present again on CTA 01/27/22             Hyperlipidemia, takes Atorvastatin, LDL 58 08/23/22             OA pain, Using walker, power w/c             Urinary incontinence, progressing       Past Medical History:  Diagnosis Date   Anginal pain (HCC) 08/24/2010   Echo-EF =>55%,LV  normal   Arthritis    Cataract 12/2006   Both eyes  Epes MD   Chest pain, unspecified 09/04/2007   Lexiscan-- EF 72%; LV normal   Constipation    Takes flax seed and honey .  Smooth Move tea.   Depression    Diverticulitis    Head injury, closed    with fall   History of cardiac cath 2012   normal coronary arteries   Hypertension    Mitral valve prolapse    Osteopenia    Stroke (HCC) 09/02/2019   Past Surgical History:  Procedure Laterality Date   ANTERIOR CERVICAL DECOMP/DISCECTOMY FUSION N/A 10/23/2012   Procedure: Cervical Five to Cervical Six, Cervical Six to Cervical Seven anterior cervical decompression with fusion plating and bonegraft;  Surgeon: Hewitt Shorts, MD;  Location: [facility];  Service: Neurosurgery;  Laterality: N/A;  ANTERIOR CERVICAL DECOMPRESSION/DISCECTOMY FUSION 2 LEVELS   BREAST CYST ASPIRATION  1990   benign   CARDIAC CATHETERIZATION  08/01/2010   normal coronaries   COLONOSCOPY  approx 2011   EYE SURGERY Bilateral 2011   intestinal blockage surgery  Feb. 2012   "kink in small intestine" per  pt    Allergies  Allergen Reactions   Contrast Media [Iodinated Contrast Media] Hives    Hives during IVP at urology office '02, requires 13 hr prep///[email or web address]  Patient came thru ER and had a 1 hour pre-medication and did fine   Oxycontin [Oxycodone] Other (See Comments)    Decreased appetite.   Boniva [Ibandronic Acid] Nausea And Vomiting and Other (See Comments)    Weakness    Codeine Diarrhea and Nausea And Vomiting   Darvocet [Propoxyphene N-Acetaminophen] Diarrhea and Nausea And Vomiting   Erythromycin Nausea And Vomiting    Allergies as of 10/01/2022       Reactions   Contrast Media [iodinated Contrast Media] Hives   Hives during IVP at urology office '02, requires 13 hr prep///[email or web address] Patient came thru ER and had a 1 hour pre-medication and did fine   Oxycontin [oxycodone] Other (See Comments)   Decreased appetite.   Boniva [ibandronic Acid] Nausea And Vomiting, Other (See Comments)   Weakness    Codeine Diarrhea, Nausea And Vomiting   Darvocet [propoxyphene N-acetaminophen] Diarrhea, Nausea And Vomiting   Erythromycin Nausea And Vomiting        Medication List        Accurate as of October 01, 2022 11:59 PM. If you have any questions, ask your nurse or doctor.          acetaminophen 325 MG tablet Commonly known as: TYLENOL Take 650 mg by mouth at bedtime. 2 entries: 650 mg every 4 hours as needed   alendronate 70 MG tablet Commonly known as: FOSAMAX TAKE 1 TAB ONCE A WEEK, AT LEAST 30 MIN BEFORE 1ST FOOD.DO NOT LIE DOWN FOR 30 MIN AFTER TAKING.   amLODipine 5 MG tablet Commonly known as: NORVASC Take 5 mg by mouth in the morning.   atorvastatin 20 MG tablet Commonly known as: LIPITOR TAKE ONE TABLET BY MOUTH ONCE DAILY.   Calcium 600 +D High Potency 600-10 MG-MCG Tabs Generic drug: Calcium Carbonate-Vitamin D Take 1 tablet by mouth in the morning.   citalopram 20 MG tablet Commonly known as: CELEXA Take 1 tablet (20 mg total) by mouth  daily.   clopidogrel 75 MG tablet Commonly known as: PLAVIX TAKE 1 TABLET ONCE DAILY.   diclofenac Sodium 1 % Gel Commonly known as: VOLTAREN Apply  1 g topically 2 (two) times daily.   docusate sodium 100 MG capsule Commonly known as: COLACE Take 100 mg by mouth at bedtime.   Eliquis 5 MG Tabs tablet Generic drug: apixaban Take 2.5 mg by mouth 2 (two) times daily.   metoprolol tartrate 25 MG tablet Commonly known as: LOPRESSOR Take 12.5 mg by mouth 2 (two) times daily.   pantoprazole 40 MG tablet Commonly known as: PROTONIX TAKE 1 TABLET ONCE DAILY.        Review of Systems  Constitutional:  Negative for activity change, appetite change and fever.  HENT:  Positive for hearing loss. Negative for congestion and trouble swallowing.   Eyes:  Negative for visual disturbance.  Respiratory:  Positive for cough. Negative for chest tightness, shortness of breath and wheezing.        Chronic occasional cough with clear phlegm. DOE since 01/27/22  Cardiovascular:  Negative for leg swelling.  Gastrointestinal:  Negative for abdominal pain and constipation.  Genitourinary:  Negative for dysuria, frequency and urgency.  Musculoskeletal:  Positive for arthralgias, back pain and gait problem.  Skin:  Positive for rash.  Neurological:  Positive for weakness. Negative for speech difficulty and headaches.       Memory lapses occasionally. Left sided weakness.   Psychiatric/Behavioral:  Positive for sleep disturbance. Negative for behavioral problems. The patient is not nervous/anxious.     Immunization History  Administered Date(s) Administered   Fluad Quad(high Dose 65+) 12/17/2018   Influenza, High Dose Seasonal PF 12/08/2016, 12/19/2020   Influenza-Unspecified 12/16/2019, 12/25/2021   Moderna Sars-Covid-2 Vaccination 03/09/2019, 04/06/2019, 01/12/2020   Pfizer Covid-19 Vaccine Bivalent Booster 77yrs & up 01/04/2022   Pneumococcal Conjugate-13 06/12/2013   Pneumococcal  Polysaccharide-23 07/28/2002   RSV,unspecified 04/05/2022   Tdap 07/27/2009   Zoster Recombinant(Shingrix) 01/08/2017, 06/03/2017   Pertinent  Health Maintenance Due  Topic Date Due   INFLUENZA VACCINE  10/04/2022   DEXA SCAN  Completed      01/27/2022   10:25 AM 01/28/2022    8:15 AM 03/13/2022    9:11 AM 03/16/2022    2:03 PM 08/22/2022    9:06 AM  Fall Risk  Falls in the past year?   0 0 0  Was there an injury with Fall?   0 0 0  Fall Risk Category Calculator   0 0 0  Fall Risk Category (Retired)   Low Low   (RETIRED) Patient Fall Risk Level Low fall risk Moderate fall risk Low fall risk Low fall risk   Patient at Risk for Falls Due to   No Fall Risks No Fall Risks No Fall Risks  Fall risk Follow up   Falls evaluation completed Falls evaluation completed    Functional Status Survey:    Vitals:   10/01/22 1037 10/04/22 1037  BP: 133/68   Pulse: (!) 52 (!) 51  Resp: 16   Temp: (!) 97.5 F (36.4 C)   SpO2: 95%   Weight: 150 lb 9.6 oz (68.3 kg)    Body mass index is 24.31 kg/m. Physical Exam Vitals and nursing note reviewed.  Constitutional:      Appearance: Normal appearance.  HENT:     Head: Normocephalic and atraumatic.     Mouth/Throat:     Mouth: Mucous membranes are moist.  Eyes:     Extraocular Movements: Extraocular movements intact.     Conjunctiva/sclera: Conjunctivae normal.     Pupils: Pupils are equal, round, and reactive to light.     Comments:  Lateral right eye peripheral vision field loss since CVA 11/2018  Cardiovascular:     Rate and Rhythm: Normal rate and regular rhythm.     Heart sounds: No murmur heard. Pulmonary:     Effort: Pulmonary effort is normal.     Breath sounds: No rales.  Abdominal:     General: Bowel sounds are normal.     Palpations: Abdomen is soft.     Tenderness: There is no abdominal tenderness.  Musculoskeletal:     Cervical back: Normal range of motion and neck supple.     Right lower leg: No edema.     Left lower  leg: No edema.     Comments: Kyphoscoliosis. Left shoulder over head ROM decreased.   Skin:    General: Skin is warm and dry.     Findings: Rash present.     Comments: Redness in the right armpit, under breast, R groin/perineal area   Neurological:     Mental Status: She is alert and oriented to person, place, and time. Mental status is at baseline.     Cranial Nerves: No cranial nerve deficit.     Motor: Weakness present.     Coordination: Coordination normal.     Gait: Gait abnormal.     Deep Tendon Reflexes: Reflexes normal.     Comments: Walker for ambulation. Left sided weakness with muscle strength 5/5.  Psychiatric:        Mood and Affect: Mood normal.        Behavior: Behavior normal.     Labs reviewed: Recent Labs    01/26/22 2223 01/28/22 0427  NA 138 140  K 3.3* 3.7  CL 104 108  CO2 25 24  GLUCOSE 125* 108*  BUN 13 14  CREATININE 0.86 0.81  CALCIUM 9.6 9.0   No results for input(s): "AST", "ALT", "ALKPHOS", "BILITOT", "PROT", "ALBUMIN" in the last 8760 hours. Recent Labs    10/19/21 0729 01/26/22 2223 01/28/22 0427  WBC 5.4 11.9* 11.4*  NEUTROABS 3,397  --   --   HGB 13.0 13.1 12.9  HCT 38.0 38.5 38.0  MCV 92.0 93.2 93.4  PLT 239 233 225   Lab Results  Component Value Date   TSH 4.05 06/30/2019   Lab Results  Component Value Date   HGBA1C 5.3 09/03/2019   Lab Results  Component Value Date   CHOL 115 10/19/2021   HDL 43 (L) 10/19/2021   LDLCALC 58 10/19/2021   TRIG 67 10/19/2021   CHOLHDL 2.7 10/19/2021    Significant Diagnostic Results in last 30 days:  No results found.  Assessment/Plan: Candidiasis of skin chronic rash in skin folds, under breast, under arms, groins, on and off, topical Micoconazole was effective, the patient desires something for itching. Will add 0.5% Triamcinolone cream bid x 2 weeks, may repeat. Moist and heat are contributory, avoid risk factors.   Hypertension Blood pressrue is controlled, takes Amlodipine,  Metoprolol. Bun/creat 16/0.91 08/23/22  CVA (cerebral vascular accident) [facility]) CVA 2nd to thrombosis of right posterior cerebral artery, Hx of CVA left occipital cortex 9/20. Takes Plavix. Atorvastatin. affected gait(balance issues and vision issues(seeing Ophthalmology), feels weak left leg.   Depression with anxiety Stable, takes Citalopram, some early am awakes, f/u psycho therapy, TSH 2.71 08/23/22  Pulmonary embolus (HCC) CTA 01/27/22 showed multiple subsegmental pulmonary emboli and pericardial effusion identified, on Eliquis, needs f/u Cardiology. EF 65-70% 01/27/22. Venous US BLE no DVT.   Bradycardia HR in 50s, asymptomatic, will obtain EKG, may  dc Metoprolol.     Family/ staff Communication: plan of care reviewed with the patient and charge nurse.   Labs/tests ordered: EKG  Time spend 40 minutes.

## 2022-10-05 DIAGNOSIS — R001 Bradycardia, unspecified: Secondary | ICD-10-CM | POA: Insufficient documentation

## 2022-10-05 NOTE — Assessment & Plan Note (Signed)
HR in 50s, asymptomatic, will obtain EKG, may dc Metoprolol.

## 2022-10-06 DIAGNOSIS — R079 Chest pain, unspecified: Secondary | ICD-10-CM | POA: Diagnosis not present

## 2022-10-12 ENCOUNTER — Encounter: Payer: Self-pay | Admitting: Family Medicine

## 2022-10-15 ENCOUNTER — Encounter: Payer: Self-pay | Admitting: Family Medicine

## 2022-10-16 ENCOUNTER — Encounter: Payer: Self-pay | Admitting: Family Medicine

## 2022-10-19 ENCOUNTER — Non-Acute Institutional Stay: Payer: Medicare PPO | Admitting: Nurse Practitioner

## 2022-10-19 ENCOUNTER — Encounter: Payer: Self-pay | Admitting: Nurse Practitioner

## 2022-10-19 DIAGNOSIS — R001 Bradycardia, unspecified: Secondary | ICD-10-CM

## 2022-10-19 DIAGNOSIS — R1310 Dysphagia, unspecified: Secondary | ICD-10-CM | POA: Diagnosis not present

## 2022-10-19 DIAGNOSIS — I10 Essential (primary) hypertension: Secondary | ICD-10-CM

## 2022-10-19 DIAGNOSIS — I2782 Chronic pulmonary embolism: Secondary | ICD-10-CM | POA: Diagnosis not present

## 2022-10-19 DIAGNOSIS — K5901 Slow transit constipation: Secondary | ICD-10-CM

## 2022-10-19 DIAGNOSIS — M810 Age-related osteoporosis without current pathological fracture: Secondary | ICD-10-CM | POA: Diagnosis not present

## 2022-10-19 DIAGNOSIS — I63331 Cerebral infarction due to thrombosis of right posterior cerebral artery: Secondary | ICD-10-CM

## 2022-10-19 DIAGNOSIS — K219 Gastro-esophageal reflux disease without esophagitis: Secondary | ICD-10-CM | POA: Diagnosis not present

## 2022-10-19 DIAGNOSIS — Z8673 Personal history of transient ischemic attack (TIA), and cerebral infarction without residual deficits: Secondary | ICD-10-CM

## 2022-10-19 DIAGNOSIS — F418 Other specified anxiety disorders: Secondary | ICD-10-CM

## 2022-10-19 DIAGNOSIS — R400 Somnolence: Secondary | ICD-10-CM

## 2022-10-19 NOTE — Assessment & Plan Note (Deleted)
01/27/22 showed multiple subsegmental pulmonary emboli and pericardial effusion identified, on Eliquis, needs f/u Cardiology. EF 65-70% 01/27/22. Venous US BLE no DVT.

## 2022-10-19 NOTE — Assessment & Plan Note (Signed)
Stable, takes Colace qd

## 2022-10-19 NOTE — Assessment & Plan Note (Signed)
Elevated Sbp, takes Amlodipine, added Lisinopril, off Metoprolol 2/2 HR 50s and c/o daytime sleepiness. Bun/creat 16/0.91 08/23/22

## 2022-10-19 NOTE — Assessment & Plan Note (Signed)
Stable, takes Pantoprazole,  Hgb 13.3 08/23/22

## 2022-10-19 NOTE — Assessment & Plan Note (Signed)
HR in 50s, denied lightheadedness, blurred vision, chest pain/pressures, palpitation, or SOB, reviewed EKG HR 53 2021, HR 88 2023, dc Metoprolol 12.5mg  every day, VS daily. Cardiology referral

## 2022-10-19 NOTE — Assessment & Plan Note (Signed)
takes Citalopram, some early am awakes, f/u psycho therapy, TSH 2.71 08/23/22

## 2022-10-19 NOTE — Assessment & Plan Note (Signed)
 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 Takes Plavix. Atorvastatin. affected gait(balance issues and vision issues(seeing Ophthalmology), feels weak left leg.

## 2022-10-19 NOTE — Assessment & Plan Note (Signed)
AR of Citalopram vs under treatment of depression vs Metoprolol/sinus bradycardia? Will dc Metoprolol, observe.

## 2022-10-19 NOTE — Assessment & Plan Note (Signed)
takes Fosamax, Ca, Vit D, t score -2.3 10/25/20 

## 2022-10-19 NOTE — Assessment & Plan Note (Signed)
CTA 01/27/22 showed multiple subsegmental pulmonary emboli and pericardial effusion identified, on Eliquis, needs f/u Cardiology. EF 65-70% 01/27/22. Venous US BLE no DVT.  

## 2022-10-19 NOTE — Progress Notes (Signed)
Location:   [facility] Room Number: 904A Place of Service:  ALF (13) Provider: Arna Snipe Myrick Mcnairy NP  Frederica Kuster, MD  Patient Care Team: Frederica Kuster, MD as PCP - General (Family Medicine) [name], MD as PCP - Cardiology (Cardiology) Juliza Machnik X, NP as Nurse Practitioner (Internal Medicine) Marzella Schlein., MD (Ophthalmology) Arminda Resides, MD as Consulting Physician (Dermatology) Arminda Resides, MD as Consulting Physician (Dermatology) Teodora Medici, MD as Consulting Physician (Gynecology)  Extended Emergency Contact Information Primary Emergency Contact: Aline Brochure [place] Phone: [phone] Relation: Daughter Secondary Emergency Contact: Black,Joel [place] Phone: [phone] Relation: Son  Code Status: DNR Goals of care: Advanced Directive information    10/19/2022   12:51 PM  Advanced Directives  Does Patient Have a Medical Advance Directive? Yes  Type of Advance Directive Living will;Out of facility DNR (pink MOST or yellow form)  Does patient want to make changes to medical advance directive? No - Patient declined     Chief Complaint  Patient presents with   Acute Visit    Patient is being seen for medication review   Immunizations    Patient is due for covid , flu and tdap vaccine     HPI:  Pt is a 88 y.o. female seen today for an acute visit for     Chronic rash in skin folds, under breast, under arms, groins, on and off, topical Micoconazole and triamcinolone was effective   Bradycardia, HR in 50s, denied lightheadedness, blurred vision, chest pain/pressures, palpitation, or SOB, reviewed EKG HR 53 2021, HR 88 2023, dc Metoprolol 12.5mg  qd              CTA 01/27/22 showed multiple subsegmental pulmonary emboli and pericardial effusion identified, on Eliquis, needs f/u Cardiology. EF 65-70% 01/27/22. Venous US BLE no DVT.              HTN, takes Amlodipine, added Lisinopril, off Metoprolol 2/2 HR 50s  and c/o daytime sleepiness. Bun/creat 16/0.91 08/23/22             Constipation, takes Colace qd.                                   GERD, takes Pantoprazole,  Hgb 13.3 08/23/22             Dysphagia, sometimes coughs when swallowing, working with ST.              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 Takes Plavix. Atorvastatin. affected gait(balance issues and vision issues(seeing Ophthalmology), feels weak left leg.              Gait abnormality, falls, uses walker, power w/c             Chronic occasional dry cough for about a year, left side throat tickles sometimes during eating, then cough, ? Resultant of CVA.              OP, takes Fosamax, Ca, Vit D, t score -2.3 10/25/20             Depression, takes Citalopram, some early am awakes, f/u psycho therapy, TSH 2.71 08/23/22             Pericardial effusion, Hx of it, present again on CTA 01/27/22  Hyperlipidemia, takes Atorvastatin, LDL 58 08/23/22             OA pain, Using walker, power w/c             Urinary incontinence, progressing         Past Medical History:  Diagnosis Date   Anginal pain (HCC) 08/24/2010   Echo-EF =>55%,LV normal   Arthritis    Cataract 12/2006   Both eyes  Epes MD   Chest pain, unspecified 09/04/2007   Lexiscan-- EF 72%; LV normal   Constipation    Takes flax seed and honey .  Smooth Move tea.   Depression    Diverticulitis    Head injury, closed    with fall   History of cardiac cath 2012   normal coronary arteries   Hypertension    Mitral valve prolapse    Osteopenia    Stroke (HCC) 09/02/2019   Past Surgical History:  Procedure Laterality Date   ANTERIOR CERVICAL DECOMP/DISCECTOMY FUSION N/A 10/23/2012   Procedure: Cervical Five to Cervical Six, Cervical Six to Cervical Seven anterior cervical decompression with fusion plating and bonegraft;  Surgeon: Hewitt Shorts, MD;  Location: [facility];  Service: Neurosurgery;   Laterality: N/A;  ANTERIOR CERVICAL DECOMPRESSION/DISCECTOMY FUSION 2 LEVELS   BREAST CYST ASPIRATION  1990   benign   CARDIAC CATHETERIZATION  08/01/2010   normal coronaries   COLONOSCOPY  approx 2011   EYE SURGERY Bilateral 2011   intestinal blockage surgery  Feb. 2012   "kink in small intestine" per pt    Allergies  Allergen Reactions   Contrast Media [Iodinated Contrast Media] Hives    Hives during IVP at urology office '02, requires 13 hr prep///[email or web address]  Patient came thru ER and had a 1 hour pre-medication and did fine   Oxycontin [Oxycodone] Other (See Comments)    Decreased appetite.   Boniva [Ibandronic Acid] Nausea And Vomiting and Other (See Comments)    Weakness    Codeine Diarrhea and Nausea And Vomiting   Darvocet [Propoxyphene N-Acetaminophen] Diarrhea and Nausea And Vomiting   Erythromycin Nausea And Vomiting    Allergies as of 10/19/2022       Reactions   Contrast Media [iodinated Contrast Media] Hives   Hives during IVP at urology office '02, requires 13 hr prep///a.[email or web address] Patient came thru ER and had a 1 hour pre-medication and did fine   Oxycontin [oxycodone] Other (See Comments)   Decreased appetite.   Boniva [ibandronic Acid] Nausea And Vomiting, Other (See Comments)   Weakness    Codeine Diarrhea, Nausea And Vomiting   Darvocet [propoxyphene N-acetaminophen] Diarrhea, Nausea And Vomiting   Erythromycin Nausea And Vomiting        Medication List        Accurate as of October 19, 2022  4:05 PM. If you have any questions, ask your nurse or doctor.          acetaminophen 325 MG tablet Commonly known as: TYLENOL Take 650 mg by mouth at bedtime. 2 entries: 650 mg every 4 hours as needed   alendronate 70 MG tablet Commonly known as: FOSAMAX TAKE 1 TAB ONCE A WEEK, AT LEAST 30 MIN BEFORE 1ST FOOD.DO NOT LIE DOWN FOR 30 MIN AFTER TAKING.   amLODipine 5 MG tablet Commonly known as: NORVASC Take 5 mg by mouth in the morning.   atorvastatin  20 MG tablet Commonly known as: LIPITOR TAKE ONE TABLET BY MOUTH ONCE DAILY.   Calcium 600 +D  High Potency 600-10 MG-MCG Tabs Generic drug: Calcium Carbonate-Vitamin D Take 1 tablet by mouth in the morning.   citalopram 20 MG tablet Commonly known as: CELEXA Take 1 tablet (20 mg total) by mouth daily.   clopidogrel 75 MG tablet Commonly known as: PLAVIX TAKE 1 TABLET ONCE DAILY.   diclofenac Sodium 1 % Gel Commonly known as: VOLTAREN Apply 1 g topically 2 (two) times daily.   docusate sodium 100 MG capsule Commonly known as: COLACE Take 100 mg by mouth at bedtime.   Eliquis 5 MG Tabs tablet Generic drug: apixaban Take 2.5 mg by mouth 2 (two) times daily.   metoprolol tartrate 25 MG tablet Commonly known as: LOPRESSOR Take 12.5 mg by mouth 2 (two) times daily.   pantoprazole 40 MG tablet Commonly known as: PROTONIX TAKE 1 TABLET ONCE DAILY.        Review of Systems  Constitutional:  Negative for activity change, appetite change and fever.       C/o daytime sleepiness  HENT:  Positive for hearing loss. Negative for congestion and trouble swallowing.   Eyes:  Negative for visual disturbance.  Respiratory:  Positive for cough. Negative for chest tightness, shortness of breath and wheezing.        Chronic occasional cough with clear phlegm. DOE since 01/27/22  Cardiovascular:  Negative for leg swelling.  Gastrointestinal:  Negative for abdominal pain and constipation.  Genitourinary:  Negative for dysuria, frequency and urgency.  Musculoskeletal:  Positive for arthralgias, back pain and gait problem.  Skin:  Negative for rash.  Neurological:  Positive for weakness. Negative for speech difficulty and headaches.       Memory lapses occasionally. Left sided weakness.   Psychiatric/Behavioral:  Positive for sleep disturbance. Negative for behavioral problems. The patient is not nervous/anxious.     Immunization History  Administered Date(s) Administered   Fluad  Quad(high Dose 65+) 12/17/2018   Influenza, High Dose Seasonal PF 12/08/2016, 12/19/2020   Influenza-Unspecified 12/16/2019, 12/25/2021   Moderna Sars-Covid-2 Vaccination 03/09/2019, 04/06/2019, 01/12/2020   Pfizer Covid-19 Vaccine Bivalent Booster 61yrs & up 01/04/2022   Pneumococcal Conjugate-13 06/12/2013   Pneumococcal Polysaccharide-23 07/28/2002   RSV,unspecified 04/05/2022   Tdap 07/27/2009   Zoster Recombinant(Shingrix) 01/08/2017, 06/03/2017   Pertinent  Health Maintenance Due  Topic Date Due   INFLUENZA VACCINE  10/04/2022   DEXA SCAN  Completed      01/27/2022   10:25 AM 01/28/2022    8:15 AM 03/13/2022    9:11 AM 03/16/2022    2:03 PM 08/22/2022    9:06 AM  Fall Risk  Falls in the past year?   0 0 0  Was there an injury with Fall?   0 0 0  Fall Risk Category Calculator   0 0 0  Fall Risk Category (Retired)   Low Low   (RETIRED) Patient Fall Risk Level Low fall risk Moderate fall risk Low fall risk Low fall risk   Patient at Risk for Falls Due to   No Fall Risks No Fall Risks No Fall Risks  Fall risk Follow up   Falls evaluation completed Falls evaluation completed    Functional Status Survey:    Vitals:   10/19/22 1249  BP: (!) 147/67  Pulse: (!) 57  Resp: 20  Temp: (!) 97.2 F (36.2 C)  TempSrc: Temporal  SpO2: 95%  Weight: 149 lb (67.6 kg)  Height: 5\' 6"  (1.676 m)   Body mass index is 24.05 kg/m. Physical Exam Vitals and nursing  note reviewed.  Constitutional:      Appearance: Normal appearance.  HENT:     Head: Normocephalic and atraumatic.     Mouth/Throat:     Mouth: Mucous membranes are moist.  Eyes:     Extraocular Movements: Extraocular movements intact.     Conjunctiva/sclera: Conjunctivae normal.     Pupils: Pupils are equal, round, and reactive to light.     Comments: Lateral right eye peripheral vision field loss since CVA 11/2018  Cardiovascular:     Rate and Rhythm: Regular rhythm. Bradycardia present.     Heart sounds: No murmur  heard. Pulmonary:     Effort: Pulmonary effort is normal.     Breath sounds: No rales.  Abdominal:     General: Bowel sounds are normal.     Palpations: Abdomen is soft.     Tenderness: There is no abdominal tenderness.  Musculoskeletal:     Cervical back: Normal range of motion and neck supple.     Right lower leg: No edema.     Left lower leg: No edema.     Comments: Kyphoscoliosis. Left shoulder over head ROM decreased.   Skin:    General: Skin is warm and dry.     Findings: No rash.     Comments: Resolved redness in the right armpit, under breast, R groin/perineal area   Neurological:     Mental Status: She is alert and oriented to person, place, and time. Mental status is at baseline.     Cranial Nerves: No cranial nerve deficit.     Motor: Weakness present.     Coordination: Coordination normal.     Gait: Gait abnormal.     Deep Tendon Reflexes: Reflexes normal.     Comments: Walker for ambulation. Left sided weakness with muscle strength 5/5.  Psychiatric:        Mood and Affect: Mood normal.        Behavior: Behavior normal.     Labs reviewed: Recent Labs    01/26/22 2223 01/28/22 0427  NA 138 140  K 3.3* 3.7  CL 104 108  CO2 25 24  GLUCOSE 125* 108*  BUN 13 14  CREATININE 0.86 0.81  CALCIUM 9.6 9.0   No results for input(s): "AST", "ALT", "ALKPHOS", "BILITOT", "PROT", "ALBUMIN" in the last 8760 hours. Recent Labs    01/26/22 2223 01/28/22 0427  WBC 11.9* 11.4*  HGB 13.1 12.9  HCT 38.5 38.0  MCV 93.2 93.4  PLT 233 225   Lab Results  Component Value Date   TSH 4.05 06/30/2019   Lab Results  Component Value Date   HGBA1C 5.3 09/03/2019   Lab Results  Component Value Date   CHOL 115 10/19/2021   HDL 43 (L) 10/19/2021   LDLCALC 58 10/19/2021   TRIG 67 10/19/2021   CHOLHDL 2.7 10/19/2021    Significant Diagnostic Results in last 30 days:  No results found.  Assessment/Plan: Bradycardia  HR in 50s, denied lightheadedness, blurred  vision, chest pain/pressures, palpitation, or SOB, reviewed EKG HR 53 2021, HR 88 2023, dc Metoprolol 12.5mg  every day, VS daily. Cardiology referral   Hypertension Elevated Sbp, takes Amlodipine, added Lisinopril, off Metoprolol 2/2 HR 50s and c/o daytime sleepiness. Bun/creat 16/0.91 08/23/22  Slow transit constipation Stable, takes Colace qd.      GERD (gastroesophageal reflux disease) Stable, takes Pantoprazole,  Hgb 13.3 08/23/22  Dysphagia  sometimes coughs when swallowing, working with ST.   CVA (cerebral vascular accident) (HCC) CVA 2nd  to thrombosis of right posterior cerebral artery, Hx of CVA left occipital cortex 9/20. Takes Plavix. Atorvastatin. affected gait(balance issues and vision issues(seeing Ophthalmology), feels weak left leg.   Pulmonary embolus (HCC) CTA 01/27/22 showed multiple subsegmental pulmonary emboli and pericardial effusion identified, on Eliquis, needs f/u Cardiology. EF 65-70% 01/27/22. Venous US BLE no DVT.   Osteoporosis  takes Fosamax, Ca, Vit D, t score -2.3 10/25/20  Depression with anxiety takes Citalopram, some early am awakes, f/u psycho therapy, TSH 2.71 08/23/22  Daytime sleepiness AR of Citalopram vs under treatment of depression vs Metoprolol/sinus bradycardia? Will dc Metoprolol, observe.     Family/ staff Communication: plan of care reviewed with the patient and charge nurse.   Labs/tests ordered:  none  Time spend 40 minutes.

## 2022-10-19 NOTE — Assessment & Plan Note (Signed)
sometimes coughs when swallowing, working with ST.  

## 2022-10-23 ENCOUNTER — Ambulatory Visit (HOSPITAL_BASED_OUTPATIENT_CLINIC_OR_DEPARTMENT_OTHER): Payer: Medicare PPO | Admitting: Cardiology

## 2022-10-23 ENCOUNTER — Encounter (HOSPITAL_BASED_OUTPATIENT_CLINIC_OR_DEPARTMENT_OTHER): Payer: Self-pay | Admitting: Cardiology

## 2022-10-23 VITALS — BP 130/82 | HR 61 | Ht 66.0 in | Wt 152.4 lb

## 2022-10-23 DIAGNOSIS — E7800 Pure hypercholesterolemia, unspecified: Secondary | ICD-10-CM | POA: Diagnosis not present

## 2022-10-23 DIAGNOSIS — Z8673 Personal history of transient ischemic attack (TIA), and cerebral infarction without residual deficits: Secondary | ICD-10-CM | POA: Diagnosis not present

## 2022-10-23 DIAGNOSIS — I10 Essential (primary) hypertension: Secondary | ICD-10-CM

## 2022-10-23 DIAGNOSIS — I3139 Other pericardial effusion (noninflammatory): Secondary | ICD-10-CM | POA: Diagnosis not present

## 2022-10-23 DIAGNOSIS — R400 Somnolence: Secondary | ICD-10-CM

## 2022-10-23 DIAGNOSIS — I341 Nonrheumatic mitral (valve) prolapse: Secondary | ICD-10-CM

## 2022-10-23 NOTE — Progress Notes (Signed)
Cardiology Office Note:  .    Date:  10/23/2022  ID:  Adriana [name], DOB 1934-04-25, MRN [id number] PCP: Frederica Kuster, MD  [facility] Providers Cardiologist:  [name], MD     History of Present Illness: .    Adriana [name] is a 88 y.o. female with a hx of CVA 11/2018 and 08/2019, hypertension, hyperlipidemia who is seen for follow up today. I initially met her 01/22/19 as a new consult at the request of Mahlon Gammon, MD for the evaluation and management of pericardial effusion on echo.   Followed by [facility] at [facility] Notes reviewed. Had another stroke 08/2019, diagnosed after fall and left side weakness, notes reviewed. Found to have acute right thalamic infarct, thought to be small vessel disease. Was changed from aspirin to clopidogrel Crooker term after 3 mos of DAPT. Continued on atorvastatin. Struggling with balance and strength since this most recent stroke, still has residual vision issues from prior stroke.  At her telehealth visit 01/2020, she was doing well overall. Needed dental work. Has previously had antibiotics in the past as she was told many years ago that she has mitral valve prolapse. We reviewed her echoes, which have not shown mitral valve prolapse recently, and also that guidelines now do not recommend antibiotics with mitral valve prolapse. Also reported stable blood pressures at home.   On 01/26/2022 she had presented to the ED with chest pain and found to have multiple subsegmental pulmonary emboli without acute cor pulmonale on CTA. D-dimer was obtained which was elevated to 1.07. She had EKG without ischemic changes. Troponin x2 negative. Initiated on anticoagulation.  Today, she states she is feeling well overall. One of her main complaint is significant daytime somnolence which she thought may be related to her bradycardia. Typically she sleeps well at night; usually able to fall back asleep after waking to use the  restroom.  Additionally, she continues to take medications for her depression. She notes that this has been bothersome lately despite compliance.   She also complains of skin pruritus and diaphoresis inferior to her breasts. Was prescribed a cream which was ineffective.  In the office today her blood pressure is 130/82. Her blood pressures and temperatures are routinely monitored at [name]. Her medications are managed by Adriana [name], with the exception of Fosamax which she takes once weekly. She believes that she is still on the metoprolol at this time.  She denies any palpitations, chest pain, shortness of breath, peripheral edema, lightheadedness, headaches, syncope, orthopnea, or PND.  ROS:  Please see the history of present illness. ROS otherwise negative except as noted.  (+) Daytime somnolence (+) Skin pruritus and diaphoresis inferior to her breasts (+) Depression  Studies Reviewed: Marland Kitchen    EKG Interpretation Date/Time:  Tuesday October 23 2022 15:05:26 EDT Ventricular Rate:  61 PR Interval:  160 QRS Duration:  66 QT Interval:  414 QTC Calculation: 416 R Axis:   16  Text Interpretation: Normal sinus rhythm Low voltage QRS Confirmed by [name] [phone]) on 10/23/2022 3:26:52 PM    Echo  01/27/2022:  1. Left ventricular ejection fraction, by estimation, is 65 to 70%. The  left ventricle has normal function. The left ventricle has no regional  wall motion abnormalities. There is mild concentric left ventricular  hypertrophy. Left ventricular diastolic  parameters are indeterminate.   2. Right ventricular systolic function is normal. The right ventricular  size is normal. Tricuspid regurgitation signal  is inadequate for assessing  PA pressure.   3. Moderate pericardial effusion. The pericardial effusion is  circumferential. There is no evidence of cardiac tamponade.   4. The mitral valve is degenerative. Trivial mitral valve regurgitation.   5. The aortic  valve is tricuspid. Aortic valve regurgitation is not  visualized.   6. Unable to estimate CVP.   Comparison(s): Prior images reviewed side by side. Moderate,  circumferential pericardial effusion, slightly larger posteriorly in  comparison, but anterior collection similar. No obvious tamponade  physiology.   Physical Exam:    VS:  BP 130/82 (BP Location: Left Arm, Patient Position: Sitting, Cuff Size: Normal)   Pulse 61   Ht 5\' 6"  (1.676 m)   Wt 152 lb 6.4 oz (69.1 kg)   BMI 24.60 kg/m    Wt Readings from Last 3 Encounters:  10/23/22 152 lb 6.4 oz (69.1 kg)  10/19/22 149 lb (67.6 kg)  10/01/22 150 lb 9.6 oz (68.3 kg)    GEN: Well nourished, well developed in no acute distress HEENT: Normal, moist mucous membranes NECK: No JVD CARDIAC: regular rhythm, normal S1 and S2, no rubs or gallops. No murmur. VASCULAR: Radial and DP pulses 2+ bilaterally. No carotid bruits RESPIRATORY:  Clear to auscultation without rales, wheezing or rhonchi  ABDOMEN: Soft, non-tender, non-distended MUSCULOSKELETAL:  Ambulates independently SKIN: Warm and dry, no edema. Erythematous skin inferior to breasts. NEUROLOGIC:  Alert and oriented x 3. No focal neuro deficits noted. PSYCHIATRIC:  Normal affect   ASSESSMENT AND PLAN: .    Daytime somnolence -she is uncertain if she is currently taking metoprolol. If still taking this, would trial stopping it and see if her symptoms improve. Given instructions on tapering off -could also consider sleep study  History of recurrent CVA Hypertension Hypercholesterolemia  History of PE -monitor did not show atrial fibrillation previously -was on DAPT for 3 mos, now on clopidogrel plus apixaban given history of PE -on atorvastatin 20 mg daily. LDL goal <70. Last LDL 58 -reports blood pressure is well controlled typically. No longer on losartan.    History of pericardial effusion: noted on echo in 2012, cath hemodynamics not suggestive of tamponade -limited  echo 2023 stable  Dispo: Follow-up as needed. She is followed closely at [facility], but I would be happy to see her back with any concerns.  I,Mathew Stumpf,acting as a Neurosurgeon for [employer], MD.,have documented all relevant documentation on the behalf of [name], MD,as directed by  [name], MD while in the presence of [name], MD.  I, [name], MD, have reviewed all documentation for this visit. The documentation on 11/25/22 for the exam, diagnosis, procedures, and orders are all accurate and complete.   Signed, [name], MD

## 2022-10-23 NOTE — Patient Instructions (Addendum)
The heart rate is not dangerously slow, but if you are having fatigue it is reasonable to stop the metoprolol I want you to take 12.5 mg of metoprolol in the morning only for four days, then stop metoprolol completely. If you have any racing/hard heartbeats after you have stopped this, please let me know.  No indication for a pacemaker, no high risk symptoms.   Ask and see if they think an antifungal powder would be helpful for your rash, given the location and need to keep it dry.  FOLLOW UP AS NEEDED

## 2022-10-24 ENCOUNTER — Telehealth: Payer: Self-pay | Admitting: Cardiology

## 2022-10-24 NOTE — Telephone Encounter (Signed)
Office is requesting a callback to discuss medications. Please advise

## 2022-10-24 NOTE — Telephone Encounter (Signed)
Left message to call back  

## 2022-10-25 ENCOUNTER — Telehealth: Payer: Self-pay | Admitting: Cardiology

## 2022-10-25 NOTE — Telephone Encounter (Signed)
Discussed with Dr Cristal Deer, no additional changes at this time  Spoke with Elnita Maxwell at [facility] and advised

## 2022-10-25 NOTE — Telephone Encounter (Signed)
New encounter started by call center, further communications in that encounter

## 2022-10-25 NOTE — Telephone Encounter (Signed)
Office calling to state that the pt medication metoprolol was already discontinued on 08/16. Pt started lisinopril on the same day. Please advise.

## 2022-10-26 DIAGNOSIS — F411 Generalized anxiety disorder: Secondary | ICD-10-CM | POA: Diagnosis not present

## 2022-10-26 DIAGNOSIS — F330 Major depressive disorder, recurrent, mild: Secondary | ICD-10-CM | POA: Diagnosis not present

## 2022-11-27 ENCOUNTER — Encounter: Payer: Self-pay | Admitting: Nurse Practitioner

## 2022-11-27 ENCOUNTER — Non-Acute Institutional Stay: Payer: Medicare PPO | Admitting: Nurse Practitioner

## 2022-11-27 DIAGNOSIS — I63331 Cerebral infarction due to thrombosis of right posterior cerebral artery: Secondary | ICD-10-CM

## 2022-11-27 DIAGNOSIS — I2782 Chronic pulmonary embolism: Secondary | ICD-10-CM

## 2022-11-27 DIAGNOSIS — R001 Bradycardia, unspecified: Secondary | ICD-10-CM

## 2022-11-27 DIAGNOSIS — H1131 Conjunctival hemorrhage, right eye: Secondary | ICD-10-CM | POA: Insufficient documentation

## 2022-11-27 DIAGNOSIS — I10 Essential (primary) hypertension: Secondary | ICD-10-CM

## 2022-11-27 DIAGNOSIS — K219 Gastro-esophageal reflux disease without esophagitis: Secondary | ICD-10-CM | POA: Diagnosis not present

## 2022-11-27 DIAGNOSIS — M810 Age-related osteoporosis without current pathological fracture: Secondary | ICD-10-CM | POA: Diagnosis not present

## 2022-11-27 NOTE — Assessment & Plan Note (Signed)
takes Fosamax, Ca, Vit D, t score -2.3 10/25/20, repeat DEXA

## 2022-11-27 NOTE — Assessment & Plan Note (Signed)
Blood pressure is controlled, takes Amlodipine, added Lisinopril, off Metoprolol 2/2 HR 50s and c/o daytime sleepiness. Bun/creat 16/0.91 08/23/22

## 2022-11-27 NOTE — Assessment & Plan Note (Signed)
right eye redness, no itching, injury, irritation, or pain, no noted drainage. No change of vision. It should heal.

## 2022-11-27 NOTE — Assessment & Plan Note (Signed)
CTA 01/27/22 showed multiple subsegmental pulmonary emboli and pericardial effusion identified, on Eliquis, Plavix, f/u Cardiology. EF 65-70% 01/27/22. Venous US BLE no DVT.

## 2022-11-27 NOTE — Assessment & Plan Note (Signed)
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 Takes Plavix. Atorvastatin. affected gait(balance issues and vision issues(seeing Ophthalmology), feels weak left leg.

## 2022-11-27 NOTE — Progress Notes (Unsigned)
Location:   [facility] Room Number: [id number] Place of Service:  ALF (13) Provider: Arna Snipe Brittin Janik NP  Venita Sheffield, MD  Patient Care Team: Venita Sheffield, MD as PCP - General (Internal Medicine) [name], MD as PCP - Cardiology (Cardiology) Emberlie Gotcher X, NP as Nurse Practitioner (Internal Medicine) Marzella Schlein., MD (Ophthalmology) Arminda Resides, MD as Consulting Physician (Dermatology) Arminda Resides, MD as Consulting Physician (Dermatology) Teodora Medici, MD as Consulting Physician (Gynecology)  Extended Emergency Contact Information Primary Emergency Contact: Aline Brochure [place] of [place] Phone: [phone] Relation: Daughter Secondary Emergency Contact: Black,Joel [place] Phone: [phone] Relation: Son  Code Status: DNR Goals of care: Advanced Directive information    11/27/2022    4:55 PM  Advanced Directives  Does Patient Have a Medical Advance Directive? Yes  Type of Advance Directive Living will;Out of facility DNR (pink MOST or yellow form)  Does patient want to make changes to medical advance directive? No - Patient declined     Chief Complaint  Patient presents with   Acute Visit     Right eye redness    HPI:  Pt is a 88 y.o. female seen today for an acute visit for right eye redness, no itching, injury, irritation, or pain, no noted drainage. No change of vision.     Chronic rash in skin folds, under breast, under arms, groins, on and off, topical Micoconazole and triamcinolone was effective              Bradycardia, HR in 50s, denied lightheadedness, blurred vision, chest pain/pressures, palpitation, or SOB, reviewed EKG HR 53 2021, HR 88 2023, normalized after dc'd Metoprolol 12.5mg  every day, followed by Cardiology              CTA 01/27/22 showed multiple subsegmental pulmonary emboli and pericardial effusion identified, on Eliquis, Plavix, f/u Cardiology. EF 65-70% 01/27/22. Venous US BLE no DVT.               HTN, takes Amlodipine, added Lisinopril, off Metoprolol 2/2 HR 50s and c/o daytime sleepiness. Bun/creat 16/0.91 08/23/22             Constipation, takes Colace qd.                                   GERD, takes Pantoprazole,  Hgb 13.3 08/23/22             Dysphagia, sometimes coughs when swallowing, working with ST.              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 Takes Plavix. Atorvastatin. affected gait(balance issues and vision issues(seeing Ophthalmology), feels weak left leg.              Gait abnormality, falls, uses walker, power w/c             Chronic occasional dry cough for about a year, left side throat tickles sometimes during eating, then cough, ? Resultant of CVA.              OP, takes Fosamax, Ca, Vit D, t score -2.3 10/25/20             Depression, takes Citalopram, some early am awakes, f/u psycho therapy, TSH 2.71 08/23/22             Pericardial effusion, Hx of it, present again on  CTA 01/27/22             Hyperlipidemia, takes Atorvastatin, LDL 58 08/23/22             OA pain, Using walker, power w/c             Urinary incontinence, progressing  Past Medical History:  Diagnosis Date   Anginal pain (HCC) 08/24/2010   Echo-EF =>55%,LV normal   Arthritis    Cataract 12/2006   Both eyes  Epes MD   Chest pain, unspecified 09/04/2007   Lexiscan-- EF 72%; LV normal   Constipation    Takes flax seed and honey .  Smooth Move tea.   Depression    Diverticulitis    Head injury, closed    with fall   History of cardiac cath 2012   normal coronary arteries   Hypertension    Mitral valve prolapse    Osteopenia    Stroke (HCC) 09/02/2019   Past Surgical History:  Procedure Laterality Date   ANTERIOR CERVICAL DECOMP/DISCECTOMY FUSION N/A 10/23/2012   Procedure: Cervical Five to Cervical Six, Cervical Six to Cervical Seven anterior cervical decompression with fusion plating and bonegraft;  Surgeon: Hewitt Shorts, MD;  Location: [facility];  Service: Neurosurgery;  Laterality: N/A;  ANTERIOR CERVICAL DECOMPRESSION/DISCECTOMY FUSION 2 LEVELS   BREAST CYST ASPIRATION  1990   benign   CARDIAC CATHETERIZATION  08/01/2010   normal coronaries   COLONOSCOPY  approx 2011   EYE SURGERY Bilateral 2011   intestinal blockage surgery  Feb. 2012   "kink in small intestine" per pt    Allergies  Allergen Reactions   Contrast Media [Iodinated Contrast Media] Hives    Hives during IVP at urology office '02, requires 13 hr prep///a.[email or web address]  Patient came thru ER and had a 1 hour pre-medication and did fine   Oxycontin [Oxycodone] Other (See Comments)    Decreased appetite.   Boniva [Ibandronic Acid] Nausea And Vomiting and Other (See Comments)    Weakness    Codeine Diarrhea and Nausea And Vomiting   Darvocet [Propoxyphene N-Acetaminophen] Diarrhea and Nausea And Vomiting   Erythromycin Nausea And Vomiting    Allergies as of 11/27/2022       Reactions   Contrast Media [iodinated Contrast Media] Hives   Hives during IVP at urology office '02, requires 13 hr prep///a.[email or web address] Patient came thru ER and had a 1 hour pre-medication and did fine   Oxycontin [oxycodone] Other (See Comments)   Decreased appetite.   Boniva [ibandronic Acid] Nausea And Vomiting, Other (See Comments)   Weakness    Codeine Diarrhea, Nausea And Vomiting   Darvocet [propoxyphene N-acetaminophen] Diarrhea, Nausea And Vomiting   Erythromycin Nausea And Vomiting        Medication List        Accurate as of November 27, 2022 11:59 PM. If you have any questions, ask your nurse or doctor.          acetaminophen 325 MG tablet Commonly known as: TYLENOL Take 650 mg by mouth at bedtime. 2 entries: 650 mg every 4 hours as needed   alendronate 70 MG tablet Commonly known as: FOSAMAX TAKE 1 TAB ONCE A WEEK, AT LEAST 30 MIN BEFORE 1ST FOOD.DO NOT LIE DOWN FOR 30 MIN AFTER TAKING.   amLODipine 5 MG tablet Commonly  known as: NORVASC Take 5 mg by mouth in the morning.   atorvastatin 20 MG tablet Commonly known as: LIPITOR TAKE ONE TABLET BY MOUTH ONCE  DAILY.   buPROPion 150 MG 24 hr tablet Commonly known as: WELLBUTRIN XL Take 150 mg by mouth daily.   Calcium 600 +D High Potency 600-10 MG-MCG Tabs Generic drug: Calcium Carbonate-Vitamin D Take 1 tablet by mouth in the morning.   citalopram 20 MG tablet Commonly known as: CELEXA Take 1 tablet (20 mg total) by mouth daily.   clopidogrel 75 MG tablet Commonly known as: PLAVIX TAKE 1 TABLET ONCE DAILY.   diclofenac Sodium 1 % Gel Commonly known as: VOLTAREN Apply 1 g topically 2 (two) times daily.   docusate sodium 100 MG capsule Commonly known as: COLACE Take 100 mg by mouth at bedtime.   Eliquis 5 MG Tabs tablet Generic drug: apixaban Take 2.5 mg by mouth 2 (two) times daily.   pantoprazole 40 MG tablet Commonly known as: PROTONIX TAKE 1 TABLET ONCE DAILY.        Review of Systems  Constitutional:  Negative for activity change, appetite change and fever.       C/o daytime sleepiness  HENT:  Positive for hearing loss. Negative for congestion and trouble swallowing.   Eyes:  Positive for redness. Negative for pain, discharge, itching and visual disturbance.       R eye  Respiratory:  Positive for cough. Negative for chest tightness, shortness of breath and wheezing.        Chronic occasional cough with clear phlegm. DOE since 01/27/22  Cardiovascular:  Negative for leg swelling.  Gastrointestinal:  Negative for abdominal pain and constipation.  Genitourinary:  Negative for dysuria, frequency and urgency.  Musculoskeletal:  Positive for arthralgias, back pain and gait problem.  Skin:  Negative for rash.  Neurological:  Positive for weakness. Negative for speech difficulty and headaches.       Memory lapses occasionally. Left sided weakness.   Psychiatric/Behavioral:  Positive for sleep disturbance. Negative for behavioral  problems. The patient is not nervous/anxious.     Immunization History  Administered Date(s) Administered   Fluad Quad(high Dose 65+) 12/17/2018   Influenza, High Dose Seasonal PF 12/08/2016, 12/19/2020   Influenza-Unspecified 12/16/2019, 12/25/2021   Moderna Sars-Covid-2 Vaccination 03/09/2019, 04/06/2019, 01/12/2020   Pfizer Covid-19 Vaccine Bivalent Booster 53yrs & up 01/04/2022   Pneumococcal Conjugate-13 06/12/2013   Pneumococcal Polysaccharide-23 07/28/2002   RSV,unspecified 04/05/2022   Tdap 07/27/2009   Zoster Recombinant(Shingrix) 01/08/2017, 06/03/2017   Pertinent  Health Maintenance Due  Topic Date Due   INFLUENZA VACCINE  10/04/2022   DEXA SCAN  Completed      01/27/2022   10:25 AM 01/28/2022    8:15 AM 03/13/2022    9:11 AM 03/16/2022    2:03 PM 08/22/2022    9:06 AM  Fall Risk  Falls in the past year?   0 0 0  Was there an injury with Fall?   0 0 0  Fall Risk Category Calculator   0 0 0  Fall Risk Category (Retired)   Low Low   (RETIRED) Patient Fall Risk Level Low fall risk Moderate fall risk Low fall risk Low fall risk   Patient at Risk for Falls Due to   No Fall Risks No Fall Risks No Fall Risks  Fall risk Follow up   Falls evaluation completed Falls evaluation completed    Functional Status Survey:    Vitals:   11/27/22 1650  BP: 107/62  Pulse: 68  Resp: 20  Temp: 98 F (36.7 C)  SpO2: 95%  Weight: 152 lb 9.6 oz (69.2 kg)  Height: 5'  6" (1.676 m)   Body mass index is 24.63 kg/m. Physical Exam Vitals and nursing note reviewed.  Constitutional:      Appearance: Normal appearance.  HENT:     Head: Normocephalic and atraumatic.     Nose: Nose normal.     Mouth/Throat:     Mouth: Mucous membranes are moist.  Eyes:     General:        Right eye: Discharge present.     Extraocular Movements: Extraocular movements intact.     Conjunctiva/sclera: Conjunctivae normal.     Pupils: Pupils are equal, round, and reactive to light.     Comments:  Lateral right eye peripheral vision field loss since CVA 11/2018. R eye subconjunctival hemorrhage.   Cardiovascular:     Rate and Rhythm: Normal rate and regular rhythm.     Heart sounds: No murmur heard. Pulmonary:     Effort: Pulmonary effort is normal.     Breath sounds: No rales.  Abdominal:     General: Bowel sounds are normal.     Palpations: Abdomen is soft.     Tenderness: There is no abdominal tenderness.  Musculoskeletal:     Cervical back: Normal range of motion and neck supple.     Right lower leg: No edema.     Left lower leg: No edema.     Comments: Kyphoscoliosis. Left shoulder over head ROM decreased.   Skin:    General: Skin is warm and dry.     Findings: No rash.     Comments: Resolved redness in the right armpit, under breast, R groin/perineal area   Neurological:     Mental Status: She is alert and oriented to person, place, and time. Mental status is at baseline.     Cranial Nerves: No cranial nerve deficit.     Motor: Weakness present.     Coordination: Coordination normal.     Gait: Gait abnormal.     Deep Tendon Reflexes: Reflexes normal.     Comments: Walker for ambulation. Left sided weakness with muscle strength 5/5.  Psychiatric:        Mood and Affect: Mood normal.        Behavior: Behavior normal.     Labs reviewed: Recent Labs    01/26/22 2223 01/28/22 0427  NA 138 140  K 3.3* 3.7  CL 104 108  CO2 25 24  GLUCOSE 125* 108*  BUN 13 14  CREATININE 0.86 0.81  CALCIUM 9.6 9.0   No results for input(s): "AST", "ALT", "ALKPHOS", "BILITOT", "PROT", "ALBUMIN" in the last 8760 hours. Recent Labs    01/26/22 2223 01/28/22 0427  WBC 11.9* 11.4*  HGB 13.1 12.9  HCT 38.5 38.0  MCV 93.2 93.4  PLT 233 225   Lab Results  Component Value Date   TSH 4.05 06/30/2019   Lab Results  Component Value Date   HGBA1C 5.3 09/03/2019   Lab Results  Component Value Date   CHOL 115 10/19/2021   HDL 43 (L) 10/19/2021   LDLCALC 58 10/19/2021    TRIG 67 10/19/2021   CHOLHDL 2.7 10/19/2021    Significant Diagnostic Results in last 30 days:  No results found.  Assessment/Plan: Subconjunctival hemorrhage of right eye right eye redness, no itching, injury, irritation, or pain, no noted drainage. No change of vision. It should heal.     Bradycardia Heart rate is normalized, off Metoprolol  Pulmonary embolus (HCC) CTA 01/27/22 showed multiple subsegmental pulmonary emboli and pericardial effusion identified,  on Eliquis, Plavix, f/u Cardiology. EF 65-70% 01/27/22. Venous US BLE no DVT.   Hypertension Blood pressure is controlled, takes Amlodipine, added Lisinopril, off Metoprolol 2/2 HR 50s and c/o daytime sleepiness. 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 Takes Plavix. Atorvastatin. affected gait(balance issues and vision issues(seeing Ophthalmology), feels weak left leg.   Osteoporosis  takes Fosamax, Ca, Vit D, t score -2.3 10/25/20, repeat DEXA    Family/ staff Communication: plan of care reviewed with the patient and charge nurse.   Labs/tests ordered:  DEXA  Time spend 40 minutes.

## 2022-11-27 NOTE — Assessment & Plan Note (Signed)
takes Pantoprazole,  Hgb 13.3 08/23/22

## 2022-11-27 NOTE — Assessment & Plan Note (Signed)
Heart rate is normalized, off Metoprolol

## 2022-11-28 ENCOUNTER — Encounter: Payer: Self-pay | Admitting: Nurse Practitioner

## 2022-11-30 ENCOUNTER — Encounter: Payer: Self-pay | Admitting: Sports Medicine

## 2022-11-30 ENCOUNTER — Non-Acute Institutional Stay: Payer: Medicare PPO | Admitting: Sports Medicine

## 2022-11-30 DIAGNOSIS — M810 Age-related osteoporosis without current pathological fracture: Secondary | ICD-10-CM

## 2022-11-30 DIAGNOSIS — K219 Gastro-esophageal reflux disease without esophagitis: Secondary | ICD-10-CM | POA: Diagnosis not present

## 2022-11-30 DIAGNOSIS — I10 Essential (primary) hypertension: Secondary | ICD-10-CM | POA: Diagnosis not present

## 2022-11-30 DIAGNOSIS — F418 Other specified anxiety disorders: Secondary | ICD-10-CM

## 2022-11-30 DIAGNOSIS — H1131 Conjunctival hemorrhage, right eye: Secondary | ICD-10-CM | POA: Diagnosis not present

## 2022-11-30 DIAGNOSIS — K5900 Constipation, unspecified: Secondary | ICD-10-CM

## 2022-11-30 DIAGNOSIS — R413 Other amnesia: Secondary | ICD-10-CM | POA: Diagnosis not present

## 2022-11-30 DIAGNOSIS — I2782 Chronic pulmonary embolism: Secondary | ICD-10-CM

## 2022-11-30 DIAGNOSIS — I63331 Cerebral infarction due to thrombosis of right posterior cerebral artery: Secondary | ICD-10-CM

## 2022-11-30 NOTE — Progress Notes (Unsigned)
Location:  [facility] Room Number: [id number] Place of Service:  ALF [phone]) Provider:  Venita Sheffield, MD  Patient Care Team: Venita Sheffield, MD as PCP - General (Internal Medicine) [name], MD as PCP - Cardiology (Cardiology) [name], [name], NP as Nurse Practitioner (Internal Medicine) Marzella Schlein., MD (Ophthalmology) Arminda Resides, MD as Consulting Physician (Dermatology) Arminda Resides, MD as Consulting Physician (Dermatology) Teodora Medici, MD as Consulting Physician (Gynecology)  Extended Emergency Contact Information Primary Emergency Contact: Aline Brochure [place] of [place] Phone: [phone] Relation: Daughter Secondary Emergency Contact: Black,Joel Mobile Phone: [phone] Relation: Son  Code Status:  DNR Goals of care: Advanced Directive information    11/30/2022    2:17 PM  Advanced Directives  Does Patient Have a Medical Advance Directive? Yes  Type of Advance Directive Living will;Out of facility DNR (pink MOST or yellow form)  Does patient want to make changes to medical advance directive? No - Patient declined     Chief Complaint  Patient presents with   Medical Management of Chronic Issues    Routine Visit   Immunizations    DTAP, Influenza and Covid    HPI:  Pt is a 88 y.o. female seen today for medical management of chronic diseases.    Reports problems with balance Ambulates with a standard walker in her room and wheelchair in the hallway  No recent falls Feels dizzy when standing up  Drinks about 3-4 glasses of water daily   Depression  States she has depression all her adult life  She feels down mostly in the morning  Denies active suicidal dieation  Tries to do some exercises every day  Ddoes not participi  Memory problems Pt reports having problems remembering names of her friends and grand kids    Past Medical History:  Diagnosis Date   Anginal pain (HCC)  08/24/2010   Echo-EF =>55%,LV normal   Arthritis    Cataract 12/2006   Both eyes  Epes MD   Chest pain, unspecified 09/04/2007   Lexiscan-- EF 72%; LV normal   Constipation    Takes flax seed and honey .  Smooth Move tea.   Depression    Diverticulitis    Head injury, closed    with fall   History of cardiac cath 2012   normal coronary arteries   Hypertension    Mitral valve prolapse    Osteopenia    Stroke (HCC) 09/02/2019   Past Surgical History:  Procedure Laterality Date   ANTERIOR CERVICAL DECOMP/DISCECTOMY FUSION N/A 10/23/2012   Procedure: Cervical Five to Cervical Six, Cervical Six to Cervical Seven anterior cervical decompression with fusion plating and bonegraft;  Surgeon: Hewitt Shorts, MD;  Location: [facility];  Service: Neurosurgery;  Laterality: N/A;  ANTERIOR CERVICAL DECOMPRESSION/DISCECTOMY FUSION 2 LEVELS   BREAST CYST ASPIRATION  1990   benign   CARDIAC CATHETERIZATION  08/01/2010   normal coronaries   COLONOSCOPY  approx 2011   EYE SURGERY Bilateral 2011   intestinal blockage surgery  Feb. 2012   "kink in small intestine" per pt    Allergies  Allergen Reactions   Contrast Media [Iodinated Contrast Media] Hives    Hives during IVP at urology office '02, requires 13 hr prep///a.calhoun  Patient came thru ER and had a 1 hour pre-medication and did fine   Oxycontin [Oxycodone] Other (See Comments)    Decreased appetite.   Boniva [Ibandronic Acid] Nausea And Vomiting and Other (See Comments)  Weakness    Codeine Diarrhea and Nausea And Vomiting   Darvocet [Propoxyphene N-Acetaminophen] Diarrhea and Nausea And Vomiting   Erythromycin Nausea And Vomiting    Outpatient Encounter Medications as of 11/30/2022  Medication Sig   acetaminophen (TYLENOL) 325 MG tablet Take 650 mg by mouth at bedtime. 2 entries: 650 mg every 4 hours as needed   alendronate (FOSAMAX) 70 MG tablet TAKE 1 TAB ONCE A WEEK, AT LEAST 30 MIN BEFORE 1ST FOOD.DO NOT LIE DOWN  FOR 30 MIN AFTER TAKING.   amLODipine (NORVASC) 5 MG tablet Take 5 mg by mouth in the morning.   apixaban (ELIQUIS) 5 MG TABS tablet Take 2.5 mg by mouth 2 (two) times daily.   atorvastatin (LIPITOR) 20 MG tablet TAKE ONE TABLET BY MOUTH ONCE DAILY.   buPROPion (WELLBUTRIN XL) 150 MG 24 hr tablet Take 150 mg by mouth daily.   Calcium Carbonate-Vitamin D (CALCIUM 600 +D HIGH POTENCY) 600-10 MG-MCG TABS Take 1 tablet by mouth in the morning.   citalopram (CELEXA) 20 MG tablet Take 1 tablet (20 mg total) by mouth daily.   clopidogrel (PLAVIX) 75 MG tablet TAKE 1 TABLET ONCE DAILY.   diclofenac Sodium (VOLTAREN) 1 % GEL Apply 1 g topically 2 (two) times daily.   docusate sodium (COLACE) 100 MG capsule Take 100 mg by mouth at bedtime.   pantoprazole (PROTONIX) 40 MG tablet TAKE 1 TABLET ONCE DAILY.   No facility-administered encounter medications on file as of 11/30/2022.    Review of Systems  Constitutional:  Negative for chills and fever.  HENT:  Negative for sinus pressure and sore throat.   Respiratory:  Negative for cough, shortness of breath and wheezing.   Cardiovascular:  Negative for chest pain, palpitations and leg swelling.  Gastrointestinal:  Negative for abdominal distention, abdominal pain, blood in stool, constipation, diarrhea, nausea and vomiting.  Genitourinary:  Negative for dysuria, frequency and urgency.  Musculoskeletal:  Positive for gait problem.  Neurological:  Negative for dizziness, weakness and numbness.  Psychiatric/Behavioral:  Negative for confusion.     Immunization History  Administered Date(s) Administered   Fluad Quad(high Dose 65+) 12/17/2018   Influenza, High Dose Seasonal PF 12/08/2016, 12/19/2020   Influenza-Unspecified 12/16/2019, 12/25/2021   Moderna Sars-Covid-2 Vaccination 03/09/2019, 04/06/2019, 01/12/2020   Pfizer Covid-19 Vaccine Bivalent Booster 33yrs & up 01/04/2022   Pneumococcal Conjugate-13 06/12/2013   Pneumococcal Polysaccharide-23  07/28/2002   RSV,unspecified 04/05/2022   Tdap 07/27/2009   Zoster Recombinant(Shingrix) 01/08/2017, 06/03/2017   Pertinent  Health Maintenance Due  Topic Date Due   INFLUENZA VACCINE  10/04/2022   DEXA SCAN  Completed      01/27/2022   10:25 AM 01/28/2022    8:15 AM 03/13/2022    9:11 AM 03/16/2022    2:03 PM 08/22/2022    9:06 AM  Fall Risk  Falls in the past year?   0 0 0  Was there an injury with Fall?   0 0 0  Fall Risk Category Calculator   0 0 0  Fall Risk Category (Retired)   Low Low   (RETIRED) Patient Fall Risk Level Low fall risk Moderate fall risk Low fall risk Low fall risk   Patient at Risk for Falls Due to   No Fall Risks No Fall Risks No Fall Risks  Fall risk Follow up   Falls evaluation completed Falls evaluation completed    Functional Status Survey:    Vitals:   11/30/22 1412  BP: 136/80  Pulse: 72  Resp: 20  Temp: (!) 96.7 F (35.9 C)  SpO2: 94%  Weight: 152 lb 9.6 oz (69.2 kg)  Height: 5\' 6"  (1.676 m)   Body mass index is 24.63 kg/m. Physical Exam Constitutional:      Appearance: Normal appearance.  HENT:     Head: Normocephalic and atraumatic.  Cardiovascular:     Rate and Rhythm: Normal rate and regular rhythm.     Heart sounds: No murmur heard. Pulmonary:     Effort: Pulmonary effort is normal. No respiratory distress.     Breath sounds: Normal breath sounds. No wheezing.  Abdominal:     General: Bowel sounds are normal. There is no distension.     Tenderness: There is no abdominal tenderness. There is no guarding or rebound.  Musculoskeletal:        General: No swelling or tenderness.  Skin:    General: Skin is dry.  Neurological:     Mental Status: She is alert. Mental status is at baseline.     Labs reviewed: Recent Labs    01/26/22 2223 01/28/22 0427 06/25/22 0000 08/23/22 0000  NA 138 140 138 140  K 3.3* 3.7 3.9 3.9  CL 104 108 106 103  CO2 25 24 26* 31*  GLUCOSE 125* 108*  --   --   BUN 13 14 15 16   CREATININE  0.86 0.81 0.7 0.9  CALCIUM 9.6 9.0 9.7 9.8   Recent Labs    08/23/22 0000  AST 19  ALT 11  ALKPHOS 62  ALBUMIN 4.4   Recent Labs    01/26/22 2223 01/28/22 0427 06/25/22 0000 08/23/22 0000  WBC 11.9* 11.4* 5.9 5.0  NEUTROABS  --   --  3,776.00 2,875.00  HGB 13.1 12.9 12.7 13.3  HCT 38.5 38.0 38 40  MCV 93.2 93.4  --   --   PLT 233 225 245 234   Lab Results  Component Value Date   TSH 2.71 08/23/2022   Lab Results  Component Value Date   HGBA1C 5.3 09/03/2019   Lab Results  Component Value Date   CHOL 124 08/23/2022   HDL 51 08/23/2022   LDLCALC 58 08/23/2022   TRIG 70 08/23/2022   CHOLHDL 2.7 10/19/2021    Significant Diagnostic Results in last 30 days:  No results found.  Assessment/Plan  1. Primary hypertension At goal  Cont with lisinopril  Avoid salty foods Increase physical activity  2. Cerebrovascular accident (CVA) due to thrombosis of right posterior cerebral artery (HCC) Cont with plavix , statin  3. Osteoporosis without current pathological fracture, unspecified osteoporosis type Cont with fosmax, cal, vit d Increase weight bearing exercises  4. Gastroesophageal reflux disease, unspecified whether esophagitis present Denies bloody or dark stools Cont with protonix  5. Depression with anxiety Cont with celexa, Wellbutrin   6. Subconjunctival hemorrhage of right eye resolved  7. Other chronic pulmonary embolism without acute cor pulmonale (HCC) Cont with eliquis  8. Constipation, unspecified constipation type Increase water intake   9. Memory deficits Will get MMSE        Family/ staff Communication: care plan discussed with the nurse  Labs/tests ordered:  none

## 2022-12-03 ENCOUNTER — Encounter: Payer: Self-pay | Admitting: Sports Medicine

## 2022-12-19 DIAGNOSIS — R2681 Unsteadiness on feet: Secondary | ICD-10-CM | POA: Diagnosis not present

## 2022-12-19 DIAGNOSIS — M5459 Other low back pain: Secondary | ICD-10-CM | POA: Diagnosis not present

## 2022-12-19 DIAGNOSIS — M6281 Muscle weakness (generalized): Secondary | ICD-10-CM | POA: Diagnosis not present

## 2022-12-25 DIAGNOSIS — M5459 Other low back pain: Secondary | ICD-10-CM | POA: Diagnosis not present

## 2022-12-25 DIAGNOSIS — M6281 Muscle weakness (generalized): Secondary | ICD-10-CM | POA: Diagnosis not present

## 2022-12-25 DIAGNOSIS — R2681 Unsteadiness on feet: Secondary | ICD-10-CM | POA: Diagnosis not present

## 2023-01-01 DIAGNOSIS — M6281 Muscle weakness (generalized): Secondary | ICD-10-CM | POA: Diagnosis not present

## 2023-01-01 DIAGNOSIS — M5459 Other low back pain: Secondary | ICD-10-CM | POA: Diagnosis not present

## 2023-01-01 DIAGNOSIS — R2681 Unsteadiness on feet: Secondary | ICD-10-CM | POA: Diagnosis not present

## 2023-01-03 DIAGNOSIS — M6281 Muscle weakness (generalized): Secondary | ICD-10-CM | POA: Diagnosis not present

## 2023-01-03 DIAGNOSIS — M5459 Other low back pain: Secondary | ICD-10-CM | POA: Diagnosis not present

## 2023-01-03 DIAGNOSIS — R2681 Unsteadiness on feet: Secondary | ICD-10-CM | POA: Diagnosis not present

## 2023-01-08 DIAGNOSIS — M6281 Muscle weakness (generalized): Secondary | ICD-10-CM | POA: Diagnosis not present

## 2023-01-08 DIAGNOSIS — R2681 Unsteadiness on feet: Secondary | ICD-10-CM | POA: Diagnosis not present

## 2023-01-08 DIAGNOSIS — M5459 Other low back pain: Secondary | ICD-10-CM | POA: Diagnosis not present

## 2023-01-10 DIAGNOSIS — M5459 Other low back pain: Secondary | ICD-10-CM | POA: Diagnosis not present

## 2023-01-10 DIAGNOSIS — R2681 Unsteadiness on feet: Secondary | ICD-10-CM | POA: Diagnosis not present

## 2023-01-10 DIAGNOSIS — M6281 Muscle weakness (generalized): Secondary | ICD-10-CM | POA: Diagnosis not present

## 2023-01-15 DIAGNOSIS — R2681 Unsteadiness on feet: Secondary | ICD-10-CM | POA: Diagnosis not present

## 2023-01-15 DIAGNOSIS — M5459 Other low back pain: Secondary | ICD-10-CM | POA: Diagnosis not present

## 2023-01-15 DIAGNOSIS — M6281 Muscle weakness (generalized): Secondary | ICD-10-CM | POA: Diagnosis not present

## 2023-01-15 DIAGNOSIS — L570 Actinic keratosis: Secondary | ICD-10-CM | POA: Diagnosis not present

## 2023-01-15 DIAGNOSIS — L853 Xerosis cutis: Secondary | ICD-10-CM | POA: Diagnosis not present

## 2023-01-17 DIAGNOSIS — M5459 Other low back pain: Secondary | ICD-10-CM | POA: Diagnosis not present

## 2023-01-17 DIAGNOSIS — R2681 Unsteadiness on feet: Secondary | ICD-10-CM | POA: Diagnosis not present

## 2023-01-17 DIAGNOSIS — M6281 Muscle weakness (generalized): Secondary | ICD-10-CM | POA: Diagnosis not present

## 2023-01-22 DIAGNOSIS — M5459 Other low back pain: Secondary | ICD-10-CM | POA: Diagnosis not present

## 2023-01-22 DIAGNOSIS — R2681 Unsteadiness on feet: Secondary | ICD-10-CM | POA: Diagnosis not present

## 2023-01-22 DIAGNOSIS — M6281 Muscle weakness (generalized): Secondary | ICD-10-CM | POA: Diagnosis not present

## 2023-01-25 DIAGNOSIS — F330 Major depressive disorder, recurrent, mild: Secondary | ICD-10-CM | POA: Diagnosis not present

## 2023-01-25 DIAGNOSIS — F411 Generalized anxiety disorder: Secondary | ICD-10-CM | POA: Diagnosis not present

## 2023-02-12 ENCOUNTER — Non-Acute Institutional Stay: Payer: Self-pay | Admitting: Nurse Practitioner

## 2023-02-12 ENCOUNTER — Encounter: Payer: Self-pay | Admitting: Nurse Practitioner

## 2023-02-12 DIAGNOSIS — K5901 Slow transit constipation: Secondary | ICD-10-CM | POA: Diagnosis not present

## 2023-02-12 DIAGNOSIS — Z8673 Personal history of transient ischemic attack (TIA), and cerebral infarction without residual deficits: Secondary | ICD-10-CM | POA: Diagnosis not present

## 2023-02-12 DIAGNOSIS — K219 Gastro-esophageal reflux disease without esophagitis: Secondary | ICD-10-CM

## 2023-02-12 DIAGNOSIS — I2782 Chronic pulmonary embolism: Secondary | ICD-10-CM | POA: Diagnosis not present

## 2023-02-12 DIAGNOSIS — B379 Candidiasis, unspecified: Secondary | ICD-10-CM

## 2023-02-12 DIAGNOSIS — I10 Essential (primary) hypertension: Secondary | ICD-10-CM

## 2023-02-12 DIAGNOSIS — F418 Other specified anxiety disorders: Secondary | ICD-10-CM | POA: Diagnosis not present

## 2023-02-12 DIAGNOSIS — M159 Polyosteoarthritis, unspecified: Secondary | ICD-10-CM

## 2023-02-12 DIAGNOSIS — R1310 Dysphagia, unspecified: Secondary | ICD-10-CM | POA: Diagnosis not present

## 2023-02-12 DIAGNOSIS — R001 Bradycardia, unspecified: Secondary | ICD-10-CM

## 2023-02-12 NOTE — Assessment & Plan Note (Signed)
sometimes coughs when swallowing, worked with ST.

## 2023-02-12 NOTE — Assessment & Plan Note (Signed)
Hx of rash in skin folds, under breast, under arms, groins, on and off, topical Micoconazole and triamcinolone was effective 02/12/23 under R breast skin rash, satellite pattern, itching, apply Nystatin, Triamcinolone cream bid x 2 wks, may repeat PRN

## 2023-02-12 NOTE — Assessment & Plan Note (Signed)
Using walker, power w/c

## 2023-02-12 NOTE — Assessment & Plan Note (Signed)
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 Takes Plavix. Atorvastatin. affected gait(balance issues and vision issues(seeing Ophthalmology), feels weak left leg.              Gait abnormality, falls, uses walker in her room, power w/c to go further.

## 2023-02-12 NOTE — Assessment & Plan Note (Signed)
CTA 01/27/22 showed multiple subsegmental pulmonary emboli and pericardial effusion identified, on Eliquis, Plavix, f/u Cardiology. EF 65-70% 01/27/22. Venous US BLE no DVT.

## 2023-02-12 NOTE — Assessment & Plan Note (Signed)
Stable, takes Pantoprazole,  Hgb 13.3 08/23/22

## 2023-02-12 NOTE — Progress Notes (Signed)
Location:   [facility] Room Number: 904-A Place of Service:  ALF (13) Provider: Arna Snipe Colen Eltzroth NP  Venita Sheffield, MD  Patient Care Team: Venita Sheffield, MD as PCP - General (Internal Medicine) [name], MD as PCP - Cardiology (Cardiology) Alic Hilburn X, NP as Nurse Practitioner (Internal Medicine) Marzella Schlein., MD (Ophthalmology) Arminda Resides, MD as Consulting Physician (Dermatology) Arminda Resides, MD as Consulting Physician (Dermatology) Teodora Medici, MD as Consulting Physician (Gynecology)  Extended Emergency Contact Information Primary Emergency Contact: Aline Brochure [place] Phone: [phone] Relation: Daughter Secondary Emergency Contact: Black,Joel Mobile Phone: [phone] Relation: Son  Code Status:  DNR Goals of care: Advanced Directive information    11/30/2022    2:17 PM  Advanced Directives  Does Patient Have a Medical Advance Directive? Yes  Type of Advance Directive Living will;Out of facility DNR (pink MOST or yellow form)  Does patient want to make changes to medical advance directive? No - Patient declined     Chief Complaint  Patient presents with   Routine    HPI:  Pt is a 88 y.o. female seen today for medical management of chronic diseases.      Chronic rash in skin folds, under breast, under arms, groins, on and off, topical Micoconazole and triamcinolone was effective              Bradycardia, reviewed EKG HR 53 2021, HR 88 2023, 10/23/22 EKG SR, vent rate 61, normalized after dc'd Metoprolol, followed by Cardiology              CTA 01/27/22 showed multiple subsegmental pulmonary emboli and pericardial effusion identified, on Eliquis, Plavix, f/u Cardiology. EF 65-70% 01/27/22. Venous US BLE no DVT.              HTN, takes Amlodipine, Lisinopril, off Metoprolol 2/2 HR 50s and c/o daytime sleepiness. Bun/creat 16/0.91 08/23/22             Constipation, takes Colace qd.                                    GERD, takes Pantoprazole,  Hgb 13.3 08/23/22             Dysphagia, sometimes coughs when swallowing, worked with ST.              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 Takes Plavix. Atorvastatin. affected gait(balance issues and vision issues(seeing Ophthalmology), feels weak left leg.              Gait abnormality, falls, uses walker in her room, power w/c to go further.              Chronic occasional dry cough for about a year, left side throat tickles sometimes during eating, then cough, ? Resultant of CVA.              OP, takes Fosamax, Ca, Vit D, t score -2.3 10/25/20             Depression, takes Citalopram, Wellbutrin,  f/u psycho therapy, TSH 2.71 08/23/22             Pericardial effusion, Hx of it, present again on CTA 01/27/22             Hyperlipidemia, takes Atorvastatin, LDL 58 08/23/22  OA pain, Using walker, power w/c             Urinary incontinence, progressing   Past Medical History:  Diagnosis Date   Anginal pain (HCC) 08/24/2010   Echo-EF =>55%,LV normal   Arthritis    Cataract 12/2006   Both eyes  Epes MD   Chest pain, unspecified 09/04/2007   Lexiscan-- EF 72%; LV normal   Constipation    Takes flax seed and honey .  Smooth Move tea.   Depression    Diverticulitis    Head injury, closed    with fall   History of cardiac cath 2012   normal coronary arteries   Hypertension    Mitral valve prolapse    Osteopenia    Stroke (HCC) 09/02/2019   Past Surgical History:  Procedure Laterality Date   ANTERIOR CERVICAL DECOMP/DISCECTOMY FUSION N/A 10/23/2012   Procedure: Cervical Five to Cervical Six, Cervical Six to Cervical Seven anterior cervical decompression with fusion plating and bonegraft;  Surgeon: Hewitt Shorts, MD;  Location: [facility];  Service: Neurosurgery;  Laterality: N/A;  ANTERIOR CERVICAL DECOMPRESSION/DISCECTOMY FUSION 2 LEVELS   BREAST CYST ASPIRATION  1990    benign   CARDIAC CATHETERIZATION  08/01/2010   normal coronaries   COLONOSCOPY  approx 2011   EYE SURGERY Bilateral 2011   intestinal blockage surgery  Feb. 2012   "kink in small intestine" per pt    Allergies  Allergen Reactions   Contrast Media [Iodinated Contrast Media] Hives    Hives during IVP at urology office '02, requires 13 hr prep///[email or web address]  Patient came thru ER and had a 1 hour pre-medication and did fine   Oxycontin [Oxycodone] Other (See Comments)    Decreased appetite.   Boniva [Ibandronic Acid] Nausea And Vomiting and Other (See Comments)    Weakness    Codeine Diarrhea and Nausea And Vomiting   Darvocet [Propoxyphene N-Acetaminophen] Diarrhea and Nausea And Vomiting   Erythromycin Nausea And Vomiting    Allergies as of 02/12/2023       Reactions   Contrast Media [iodinated Contrast Media] Hives   Hives during IVP at urology office '02, requires 13 hr prep///[email or web address] Patient came thru ER and had a 1 hour pre-medication and did fine   Oxycontin [oxycodone] Other (See Comments)   Decreased appetite.   Boniva [ibandronic Acid] Nausea And Vomiting, Other (See Comments)   Weakness    Codeine Diarrhea, Nausea And Vomiting   Darvocet [propoxyphene N-acetaminophen] Diarrhea, Nausea And Vomiting   Erythromycin Nausea And Vomiting        Medication List        Accurate as of February 12, 2023  4:13 PM. If you have any questions, ask your nurse or doctor.          acetaminophen 325 MG tablet Commonly known as: TYLENOL Take 650 mg by mouth at bedtime. 2 entries: 650 mg every 4 hours as needed   alendronate 70 MG tablet Commonly known as: FOSAMAX TAKE 1 TAB ONCE A WEEK, AT LEAST 30 MIN BEFORE 1ST FOOD.DO NOT LIE DOWN FOR 30 MIN AFTER TAKING.   amLODipine 5 MG tablet Commonly known as: NORVASC Take 5 mg by mouth in the morning.   atorvastatin 20 MG tablet Commonly known as: LIPITOR TAKE ONE TABLET BY MOUTH ONCE DAILY.   buPROPion 150 MG 24 hr  tablet Commonly known as: WELLBUTRIN XL Take 150 mg by mouth daily.   Calcium 600 +D High Potency 600-10 MG-MCG Tabs  Generic drug: Calcium Carbonate-Vitamin D Take 1 tablet by mouth in the morning.   citalopram 20 MG tablet Commonly known as: CELEXA Take 1 tablet (20 mg total) by mouth daily.   clopidogrel 75 MG tablet Commonly known as: PLAVIX TAKE 1 TABLET ONCE DAILY.   diclofenac Sodium 1 % Gel Commonly known as: VOLTAREN Apply 1 g topically 2 (two) times daily.   docusate sodium 100 MG capsule Commonly known as: COLACE Take 100 mg by mouth at bedtime.   Eliquis 5 MG Tabs tablet Generic drug: apixaban Take 2.5 mg by mouth 2 (two) times daily.   lisinopril 10 MG tablet Commonly known as: ZESTRIL Take 10 mg by mouth daily.   nystatin cream Commonly known as: MYCOSTATIN Apply to under breasts (rash) topically every 12 hours as needed for moisture associated rash Apply to under breasts (rash) topically two times a day for moisture associated rash for 2 Weeks   pantoprazole 40 MG tablet Commonly known as: PROTONIX TAKE 1 TABLET ONCE DAILY.   triamcinolone cream 0.1 % Commonly known as: KENALOG Apply to under breast (rash) topically every 12 hours as needed for moisture associated rash Apply to under breasts (rash) topically two times a day for moisture associated rash for 2 Weeks        Review of Systems  Constitutional:  Negative for activity change, appetite change and fever.  HENT:  Positive for hearing loss. Negative for congestion and trouble swallowing.   Eyes:  Negative for visual disturbance.  Respiratory:  Positive for cough. Negative for chest tightness, shortness of breath and wheezing.        Chronic occasional cough with clear phlegm. DOE since 01/27/22  Cardiovascular:  Negative for leg swelling.  Gastrointestinal:  Negative for abdominal pain and constipation.  Genitourinary:  Negative for dysuria, frequency and urgency.  Musculoskeletal:   Positive for arthralgias, back pain and gait problem.  Skin:  Positive for rash.  Neurological:  Positive for weakness. Negative for speech difficulty and headaches.       Memory lapses occasionally. Left sided weakness.   Psychiatric/Behavioral:  Negative for behavioral problems and sleep disturbance. The patient is not nervous/anxious.     Immunization History  Administered Date(s) Administered   Fluad Quad(high Dose 65+) 12/17/2018   Influenza, High Dose Seasonal PF 12/08/2016, 12/19/2020   Influenza-Unspecified 12/16/2019, 12/25/2021   Moderna Sars-Covid-2 Vaccination 03/09/2019, 04/06/2019, 01/12/2020   Pfizer Covid-19 Vaccine Bivalent Booster 41yrs & up 01/04/2022   Pneumococcal Conjugate-13 06/12/2013   Pneumococcal Polysaccharide-23 07/28/2002   RSV,unspecified 04/05/2022   Tdap 07/27/2009   Zoster Recombinant(Shingrix) 01/08/2017, 06/03/2017   Pertinent  Health Maintenance Due  Topic Date Due   INFLUENZA VACCINE  10/04/2022   DEXA SCAN  Completed      01/27/2022   10:25 AM 01/28/2022    8:15 AM 03/13/2022    9:11 AM 03/16/2022    2:03 PM 08/22/2022    9:06 AM  Fall Risk  Falls in the past year?   0 0 0  Was there an injury with Fall?   0 0 0  Fall Risk Category Calculator   0 0 0  Fall Risk Category (Retired)   Low Low   (RETIRED) Patient Fall Risk Level Low fall risk Moderate fall risk Low fall risk Low fall risk   Patient at Risk for Falls Due to   No Fall Risks No Fall Risks No Fall Risks  Fall risk Follow up   Falls evaluation completed Falls evaluation completed  Functional Status Survey:    Vitals:   02/12/23 1424  BP: (!) 148/67  Pulse: 65  Resp: 18  Temp: (!) 97.3 F (36.3 C)  SpO2: 97%  Weight: 147 lb 9.6 oz (67 kg)   Body mass index is 23.82 kg/m. Physical Exam Vitals and nursing note reviewed.  Constitutional:      Appearance: Normal appearance.  HENT:     Head: Normocephalic and atraumatic.     Nose: Nose normal.     Mouth/Throat:      Mouth: Mucous membranes are moist.  Eyes:     Extraocular Movements: Extraocular movements intact.     Conjunctiva/sclera: Conjunctivae normal.     Pupils: Pupils are equal, round, and reactive to light.     Comments: Lateral right eye peripheral vision field loss since CVA 11/2018.  Cardiovascular:     Rate and Rhythm: Normal rate and regular rhythm.     Heart sounds: No murmur heard. Pulmonary:     Effort: Pulmonary effort is normal.     Breath sounds: No rales.  Abdominal:     General: Bowel sounds are normal.     Palpations: Abdomen is soft.     Tenderness: There is no abdominal tenderness.  Musculoskeletal:     Cervical back: Normal range of motion and neck supple.     Right lower leg: No edema.     Left lower leg: No edema.     Comments: Kyphoscoliosis. Left shoulder over head ROM decreased.   Skin:    General: Skin is warm and dry.     Findings: Rash present.     Comments: Red papular satellite pattern pruritic rash under the right breast.    Neurological:     Mental Status: She is alert and oriented to person, place, and time. Mental status is at baseline.     Motor: Weakness present.     Coordination: Coordination normal.     Gait: Gait abnormal.     Deep Tendon Reflexes: Reflexes normal.     Comments: Walker for ambulation. Left sided weakness with muscle strength 5/5.  Psychiatric:        Mood and Affect: Mood normal.        Behavior: Behavior normal.     Labs reviewed: Recent Labs    06/25/22 0000 08/23/22 0000  NA 138 140  K 3.9 3.9  CL 106 103  CO2 26* 31*  BUN 15 16  CREATININE 0.7 0.9  CALCIUM 9.7 9.8   Recent Labs    08/23/22 0000  AST 19  ALT 11  ALKPHOS 62  ALBUMIN 4.4   Recent Labs    06/25/22 0000 08/23/22 0000  WBC 5.9 5.0  NEUTROABS 3,776.00 2,875.00  HGB 12.7 13.3  HCT 38 40  PLT 245 234   Lab Results  Component Value Date   TSH 2.71 08/23/2022   Lab Results  Component Value Date   HGBA1C 5.3 09/03/2019   Lab Results   Component Value Date   CHOL 124 08/23/2022   HDL 51 08/23/2022   LDLCALC 58 08/23/2022   TRIG 70 08/23/2022   CHOLHDL 2.7 10/19/2021    Significant Diagnostic Results in last 30 days:  No results found.  Assessment/Plan  Candidiasis Hx of rash in skin folds, under breast, under arms, groins, on and off, topical Micoconazole and triamcinolone was effective 02/12/23 under R breast skin rash, satellite pattern, itching, apply Nystatin, Triamcinolone cream bid x 2 wks, may repeat PRN  Bradycardia  reviewed EKG HR 53 2021, HR 88 2023, 10/23/22 EKG SR, vent rate 61, normalized after dc'd Metoprolol, followed by Cardiology  Pulmonary embolus (HCC) CTA 01/27/22 showed multiple subsegmental pulmonary emboli and pericardial effusion identified, on Eliquis, Plavix, f/u Cardiology. EF 65-70% 01/27/22. Venous US BLE no DVT.   Hypertension Intermittent mildly elevated Sbp, takes Amlodipine, Lisinopril, off Metoprolol 2/2 HR 50s and c/o daytime sleepiness. Bun/creat 16/0.91 08/23/22  Slow transit constipation Stable,  takes Colace qd.        GERD (gastroesophageal reflux disease) Stable,  takes Pantoprazole,  Hgb 13.3 08/23/22  Dysphagia sometimes coughs when swallowing, worked with ST.   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 Takes Plavix. Atorvastatin. affected gait(balance issues and vision issues(seeing Ophthalmology), feels weak left leg.              Gait abnormality, falls, uses walker in her room, power w/c to go further.   Depression with anxiety Her mood is stable,  takes Citalopram, Wellbutrin,  f/u psycho therapy, TSH 2.71 08/23/22  Generalized osteoarthritis of multiple sites  Using walker, power w/c   Family/ staff Communication: plan of care reviewed with the patient and charge nurse.   Labs/tests ordered:  none  Time spend 40 minutes.

## 2023-02-12 NOTE — Assessment & Plan Note (Signed)
Intermittent mildly elevated Sbp, takes Amlodipine, Lisinopril, off Metoprolol 2/2 HR 50s and c/o daytime sleepiness. Bun/creat 16/0.91 08/23/22

## 2023-02-12 NOTE — Assessment & Plan Note (Signed)
Her mood is stable,  takes Citalopram, Wellbutrin,  f/u psycho therapy, TSH 2.71 08/23/22

## 2023-02-12 NOTE — Assessment & Plan Note (Signed)
reviewed EKG HR 53 2021, HR 88 2023, 10/23/22 EKG SR, vent rate 61, normalized after dc'd Metoprolol, followed by Cardiology

## 2023-02-12 NOTE — Assessment & Plan Note (Signed)
Stable, takes Colace qd

## 2023-03-05 ENCOUNTER — Encounter: Payer: Self-pay | Admitting: Nurse Practitioner

## 2023-03-05 ENCOUNTER — Non-Acute Institutional Stay: Payer: Self-pay | Admitting: Nurse Practitioner

## 2023-03-05 DIAGNOSIS — R001 Bradycardia, unspecified: Secondary | ICD-10-CM

## 2023-03-05 DIAGNOSIS — I2782 Chronic pulmonary embolism: Secondary | ICD-10-CM | POA: Diagnosis not present

## 2023-03-05 DIAGNOSIS — R269 Unspecified abnormalities of gait and mobility: Secondary | ICD-10-CM | POA: Diagnosis not present

## 2023-03-05 DIAGNOSIS — K219 Gastro-esophageal reflux disease without esophagitis: Secondary | ICD-10-CM | POA: Diagnosis not present

## 2023-03-05 DIAGNOSIS — I10 Essential (primary) hypertension: Secondary | ICD-10-CM

## 2023-03-05 DIAGNOSIS — R1310 Dysphagia, unspecified: Secondary | ICD-10-CM

## 2023-03-05 DIAGNOSIS — M810 Age-related osteoporosis without current pathological fracture: Secondary | ICD-10-CM

## 2023-03-05 DIAGNOSIS — N183 Chronic kidney disease, stage 3 unspecified: Secondary | ICD-10-CM | POA: Diagnosis not present

## 2023-03-05 DIAGNOSIS — R531 Weakness: Secondary | ICD-10-CM | POA: Diagnosis not present

## 2023-03-05 DIAGNOSIS — I639 Cerebral infarction, unspecified: Secondary | ICD-10-CM

## 2023-03-05 DIAGNOSIS — K5901 Slow transit constipation: Secondary | ICD-10-CM

## 2023-03-05 DIAGNOSIS — F418 Other specified anxiety disorders: Secondary | ICD-10-CM

## 2023-03-05 LAB — HEPATIC FUNCTION PANEL
ALT: 19 U/L (ref 7–35)
AST: 25 (ref 13–35)
Alkaline Phosphatase: 43 (ref 25–125)
Bilirubin, Total: 0.4

## 2023-03-05 LAB — BASIC METABOLIC PANEL
BUN: 24 — AB (ref 4–21)
CO2: 25 — AB (ref 13–22)
Chloride: 103 (ref 99–108)
Creatinine: 1 (ref 0.5–1.1)
Glucose: 78
Potassium: 3.6 meq/L (ref 3.5–5.1)
Sodium: 138 (ref 137–147)

## 2023-03-05 LAB — CBC AND DIFFERENTIAL
HCT: 37 (ref 36–46)
Hemoglobin: 12.1 (ref 12.0–16.0)
Neutrophils Absolute: 4477
Platelets: 229 10*3/uL (ref 150–400)
WBC: 6.1

## 2023-03-05 LAB — COMPREHENSIVE METABOLIC PANEL
Albumin: 3.8 (ref 3.5–5.0)
Calcium: 9.2 (ref 8.7–10.7)
Globulin: 2.4

## 2023-03-05 LAB — CBC: RBC: 3.83 — AB (ref 3.87–5.11)

## 2023-03-05 NOTE — Assessment & Plan Note (Signed)
 fallx2 last night and night before, legs gave away, softly landed on the carpet, no injuries, c/o generalized weakness, no further diarrhea, nausea, vomiting >2 days, improved appetite. She is afebrile.  Pending CBC/diff, CMP/eGFR

## 2023-03-05 NOTE — Assessment & Plan Note (Signed)
 takes Citalopram, Wellbutrin,  f/u psycho therapy, TSH 2.71 08/23/22

## 2023-03-05 NOTE — Assessment & Plan Note (Signed)
takes Fosamax, Ca, Vit D, t score -2.3 10/25/20 

## 2023-03-05 NOTE — Assessment & Plan Note (Signed)
 sometimes coughs when swallowing, worked with ST.

## 2023-03-05 NOTE — Progress Notes (Addendum)
 Location:  [facility] Room Number: 904-A Place of Service:  ALF [phone]) Provider:  Wendi Lastra Lorenda Adline CARROLYN Sherlynn Jackalyn, MD  Patient Care Team: Sherlynn Jackalyn, MD as PCP - General (Internal Medicine) Lonni Slain, MD as PCP - Cardiology (Cardiology) Lydell Moga X, NP as Nurse Practitioner (Internal Medicine) Debarah Lorrene DEL., MD (Ophthalmology) Joshua Sieving, MD as Consulting Physician (Dermatology) Joshua Sieving, MD as Consulting Physician (Dermatology) Darina Barrio, MD as Consulting Physician (Gynecology)  Extended Emergency Contact Information Primary Emergency Contact: Lindsay,Julie  [place] Mobile Phone: [phone] Relation: Daughter Secondary Emergency Contact: Black,Joel Mobile Phone: [phone] Relation: Son  Code Status:  DNR Goals of care: Advanced Directive information    03/05/2023   11:01 AM  Advanced Directives  Does Patient Have a Medical Advance Directive? Yes  Type of Estate Agent of [place];Living will;Out of facility DNR (pink MOST or yellow form)  Does patient want to make changes to medical advance directive? No - Patient declined  Copy of Healthcare Power of Attorney in Chart? Yes - validated most recent copy scanned in chart (See row information)     Chief Complaint  Patient presents with   Acute Visit    Fall    HPI:  Pt is a 88 y.o. female seen today for an acute visit for fallx2 last night and night before, legs gave away, softly landed on the carpet, no injuries, c/o generalized weakness, no further diarrhea, nausea, vomiting >2 days, improved appetite. She is afebrile.      Bradycardia, reviewed EKG HR 53 2021, HR 88 2023, 10/23/22 EKG SR, vent rate 61, normalized after dc'd Metoprolol , followed by Cardiology              CTA 01/27/22 showed multiple subsegmental pulmonary emboli and pericardial effusion identified, on Eliquis , Plavix , f/u Cardiology. EF 65-70%  01/27/22. Venous US  BLE no DVT.              HTN, takes Amlodipine , Lisinopril , off Metoprolol  2/2 HR 50s and c/o daytime sleepiness. Bun/creat 16/0.91 08/23/22             Constipation, takes Colace                               GERD, takes Pantoprazole ,  Hgb 13.3 08/23/22             Dysphagia, sometimes coughs when swallowing, worked with ST.              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 Takes Plavix . Atorvastatin . affected gait(balance issues and vision issues(seeing Ophthalmology), feels weak left leg.              Gait abnormality, falls, uses walker in her room, power w/c to go further.              Chronic occasional dry cough for about a year, left side throat tickles sometimes during eating, then cough, ? Resultant of CVA.              OP, takes Fosamax , Ca, Vit D, t score -2.3 10/25/20, 06/13/23 DEXA             Depression, takes Citalopram , Wellbutrin ,  f/u psycho therapy, TSH 2.71 08/23/22             Pericardial effusion, Hx of it, present again on CTA 01/27/22  Hyperlipidemia, takes Atorvastatin , LDL 58 08/23/22             OA pain, Using walker, power w/c             Urinary incontinence, progressing     Past Medical History:  Diagnosis Date   Anginal pain (HCC) 08/24/2010   Echo-EF =>55%,LV normal   Arthritis    Cataract 12/2006   Both eyes  Epes MD   Chest pain, unspecified 09/04/2007   Lexiscan-- EF 72%; LV normal   Constipation    Takes flax seed and honey .  Smooth Move tea.   Depression    Diverticulitis    Head injury, closed    with fall   History of cardiac cath 2012   normal coronary arteries   Hypertension    Mitral valve prolapse    Osteopenia    Stroke (HCC) 09/02/2019   Past Surgical History:  Procedure Laterality Date   ANTERIOR CERVICAL DECOMP/DISCECTOMY FUSION N/A 10/23/2012   Procedure: Cervical Five to Cervical Six, Cervical Six to Cervical Seven anterior cervical  decompression with fusion plating and bonegraft;  Surgeon: Lamar LELON Peaches, MD;  Location: [facility];  Service: Neurosurgery;  Laterality: N/A;  ANTERIOR CERVICAL DECOMPRESSION/DISCECTOMY FUSION 2 LEVELS   BREAST CYST ASPIRATION  1990   benign   CARDIAC CATHETERIZATION  08/01/2010   normal coronaries   COLONOSCOPY  approx 2011   EYE SURGERY Bilateral 2011   intestinal blockage surgery  Feb. 2012   kink in small intestine per pt    Allergies  Allergen Reactions   Contrast Media [Iodinated Contrast Media] Hives    Hives during IVP at urology office '02, requires 13 hr prep///[email or web address]  Patient came thru ER and had a 1 hour pre-medication and did fine   Oxycontin  [Oxycodone ] Other (See Comments)    Decreased appetite.   Boniva [Ibandronic Acid] Nausea And Vomiting and Other (See Comments)    Weakness    Codeine Diarrhea and Nausea And Vomiting   Darvocet [Propoxyphene N-Acetaminophen ] Diarrhea and Nausea And Vomiting   Erythromycin Nausea And Vomiting    Outpatient Encounter Medications as of 03/05/2023  Medication Sig   acetaminophen  (TYLENOL ) 325 MG tablet Take 650 mg by mouth at bedtime. 2 entries: 650 mg every 6 hours as needed   alendronate  (FOSAMAX ) 70 MG tablet TAKE 1 TAB ONCE A WEEK, AT LEAST 30 MIN BEFORE 1ST FOOD.DO NOT LIE DOWN FOR 30 MIN AFTER TAKING.   amLODipine  (NORVASC ) 5 MG tablet Take 5 mg by mouth in the morning.   apixaban  (ELIQUIS ) 5 MG TABS tablet Take 2.5 mg by mouth 2 (two) times daily.   atorvastatin  (LIPITOR) 20 MG tablet TAKE ONE TABLET BY MOUTH ONCE DAILY.   buPROPion  (WELLBUTRIN  XL) 150 MG 24 hr tablet Take 150 mg by mouth daily.   Calcium  Carbonate-Vitamin D  (CALCIUM  600 +D HIGH POTENCY) 600-10 MG-MCG TABS Take 1 tablet by mouth in the morning.   citalopram  (CELEXA ) 20 MG tablet Take 1 tablet (20 mg total) by mouth daily.   clopidogrel  (PLAVIX ) 75 MG tablet TAKE 1 TABLET ONCE DAILY.   diclofenac Sodium (VOLTAREN) 1 % GEL Apply 1 g topically 2  (two) times daily.   docusate sodium  (COLACE) 100 MG capsule Take 100 mg by mouth at bedtime.   lisinopril  (ZESTRIL ) 10 MG tablet Take 10 mg by mouth daily.   nystatin cream (MYCOSTATIN) Apply to under breasts (rash) topically every 12 hours as needed for moisture associated rash Apply  to under breasts (rash) topically two times a day for moisture associated rash for 2 Weeks   pantoprazole  (PROTONIX ) 40 MG tablet TAKE 1 TABLET ONCE DAILY.   triamcinolone  cream (KENALOG) 0.1 % Apply to under breast (rash) topically every 12 hours as needed for moisture associated rash Apply to under breasts (rash) topically two times a day for moisture associated rash for 2 Weeks   No facility-administered encounter medications on file as of 03/05/2023.    Review of Systems  Constitutional:  Positive for fatigue. Negative for appetite change and fever.  HENT:  Positive for hearing loss. Negative for congestion and trouble swallowing.   Eyes:  Negative for visual disturbance.  Respiratory:  Positive for cough. Negative for chest tightness, shortness of breath and wheezing.        Chronic occasional cough with clear phlegm. DOE since 01/27/22  Cardiovascular:  Negative for leg swelling.  Gastrointestinal:  Negative for abdominal pain and constipation.  Genitourinary:  Negative for dysuria, frequency and urgency.  Musculoskeletal:  Positive for arthralgias, back pain and gait problem.  Skin:  Negative for color change.  Neurological:  Positive for weakness. Negative for speech difficulty and headaches.       Memory lapses occasionally. Left sided weakness.   Psychiatric/Behavioral:  Negative for behavioral problems and sleep disturbance. The patient is not nervous/anxious.     Immunization History  Administered Date(s) Administered   Fluad Quad(high Dose 65+) 12/17/2018   Influenza, High Dose Seasonal PF 12/08/2016, 12/19/2020   Influenza-Unspecified 12/16/2019, 12/25/2021, 12/05/2022   Moderna  Sars-Covid-2 Vaccination 03/09/2019, 04/06/2019, 01/12/2020, 12/19/2022   Pfizer Covid-19 Vaccine Bivalent Booster 65yrs & up 01/04/2022   Pneumococcal Conjugate-13 06/12/2013   Pneumococcal Polysaccharide-23 07/28/2002   RSV,unspecified 04/05/2022   Tdap 07/27/2009   Zoster Recombinant(Shingrix) 01/08/2017, 06/03/2017   Pertinent  Health Maintenance Due  Topic Date Due   INFLUENZA VACCINE  Completed   DEXA SCAN  Completed      01/27/2022   10:25 AM 01/28/2022    8:15 AM 03/13/2022    9:11 AM 03/16/2022    2:03 PM 08/22/2022    9:06 AM  Fall Risk  Falls in the past year?   0 0 0  Was there an injury with Fall?   0 0 0  Fall Risk Category Calculator   0 0 0  Fall Risk Category (Retired)   Low Low   (RETIRED) Patient Fall Risk Level Low fall risk Moderate fall risk Low fall risk Low fall risk   Patient at Risk for Falls Due to   No Fall Risks No Fall Risks No Fall Risks  Fall risk Follow up   Falls evaluation completed Falls evaluation completed    Functional Status Survey:    Vitals:   03/05/23 1054  BP: 118/76  Pulse: 64  Resp: 17  Temp: 98.2 F (36.8 C)  SpO2: 96%  Weight: 147 lb 9.6 oz (67 kg)  Height: 5' 6 (1.676 m)   Body mass index is 23.82 kg/m. Physical Exam Vitals and nursing note reviewed.  Constitutional:      Comments: Stayed in recliner today, usually she is up in motorized w/c   HENT:     Head: Normocephalic and atraumatic.     Nose: Nose normal.     Mouth/Throat:     Mouth: Mucous membranes are moist.  Eyes:     Extraocular Movements: Extraocular movements intact.     Conjunctiva/sclera: Conjunctivae normal.     Pupils: Pupils are equal,  round, and reactive to light.     Comments: Lateral right eye peripheral vision field loss since CVA 11/2018.  Cardiovascular:     Rate and Rhythm: Normal rate and regular rhythm.     Heart sounds: No murmur heard. Pulmonary:     Effort: Pulmonary effort is normal.     Breath sounds: No rales.  Abdominal:      General: Bowel sounds are normal.     Palpations: Abdomen is soft.     Tenderness: There is no abdominal tenderness.  Musculoskeletal:     Cervical back: Normal range of motion and neck supple.     Right lower leg: No edema.     Left lower leg: No edema.     Comments: Kyphoscoliosis. Left shoulder over head ROM decreased.   Skin:    General: Skin is warm and dry.     Comments:    Neurological:     Mental Status: She is alert and oriented to person, place, and time. Mental status is at baseline.     Motor: Weakness present.     Coordination: Coordination normal.     Gait: Gait abnormal.     Deep Tendon Reflexes: Reflexes normal.     Comments: Walker for ambulation. Left sided weakness with muscle strength 5/5.  Psychiatric:        Mood and Affect: Mood normal.        Behavior: Behavior normal.     Labs reviewed: Recent Labs    06/25/22 0000 08/23/22 0000  NA 138 140  K 3.9 3.9  CL 106 103  CO2 26* 31*  BUN 15 16  CREATININE 0.7 0.9  CALCIUM  9.7 9.8   Recent Labs    08/23/22 0000  AST 19  ALT 11  ALKPHOS 62  ALBUMIN 4.4   Recent Labs    06/25/22 0000 08/23/22 0000  WBC 5.9 5.0  NEUTROABS 3,776.00 2,875.00  HGB 12.7 13.3  HCT 38 40  PLT 245 234   Lab Results  Component Value Date   TSH 2.71 08/23/2022   Lab Results  Component Value Date   HGBA1C 5.3 09/03/2019   Lab Results  Component Value Date   CHOL 124 08/23/2022   HDL 51 08/23/2022   LDLCALC 58 08/23/2022   TRIG 70 08/23/2022   CHOLHDL 2.7 10/19/2021    Significant Diagnostic Results in last 30 days:  No results found.  Assessment/Plan Generalized weakness fallx2 last night and night before, legs gave away, softly landed on the carpet, no injuries, c/o generalized weakness, no further diarrhea, nausea, vomiting >2 days, improved appetite. She is afebrile.  Pending CBC/diff, CMP/eGFR  Bradycardia Bradycardia, reviewed EKG HR 53 2021, HR 88 2023, 10/23/22 EKG SR, vent rate 61,  normalized after dc'd Metoprolol , followed by Cardiology  Pulmonary embolus (HCC)   CTA 01/27/22 showed multiple subsegmental pulmonary emboli and pericardial effusion identified, on Eliquis , Plavix , f/u Cardiology. EF 65-70% 01/27/22. Venous US  BLE no DVT.   Hypertension Blood pressure is controlled,  takes Amlodipine , Lisinopril , off Metoprolol  2/2 HR 50s and c/o daytime sleepiness. Bun/creat 16/0.91 08/23/22  Slow transit constipation Stable  GERD (gastroesophageal reflux disease) Stable,  takes Pantoprazole ,  Hgb 13.3 08/23/22  Dysphagia sometimes coughs when swallowing, worked with ST.   Ischemic stroke (HCC) CVA 2nd to thrombosis of right posterior cerebral artery, Hx of CVA left occipital cortex 9/20. Takes Plavix . Atorvastatin . affected gait(balance issues and vision issues(seeing Ophthalmology), feels weak left leg.   Gait abnormality falls, uses  walker in her room, power w/c to go further.   Osteoporosis  takes Fosamax , Ca, Vit D, t score -2.3 10/25/20  Depression with anxiety  takes Citalopram , Wellbutrin ,  f/u psycho therapy, TSH 2.71 08/23/22     Family/ staff Communication: plan of care reviewed with the patient and charge nurse.   Labs/tests ordered:  pending CBC/diff, CMP/eGFR  Time spend 40 minutes.

## 2023-03-05 NOTE — Assessment & Plan Note (Signed)
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 Takes Plavix. Atorvastatin. affected gait(balance issues and vision issues(seeing Ophthalmology), feels weak left leg.

## 2023-03-05 NOTE — Assessment & Plan Note (Signed)
Blood pressure is controlled,  takes Amlodipine, Lisinopril, off Metoprolol 2/2 HR 50s and c/o daytime sleepiness. Bun/creat 16/0.91 08/23/22

## 2023-03-05 NOTE — Assessment & Plan Note (Signed)
Stable

## 2023-03-05 NOTE — Assessment & Plan Note (Signed)
 falls, uses walker in her room, power w/c to go further.

## 2023-03-05 NOTE — Assessment & Plan Note (Signed)
 Bradycardia, reviewed EKG HR 53 2021, HR 88 2023, 10/23/22 EKG SR, vent rate 61, normalized after dc'd Metoprolol, followed by Cardiology

## 2023-03-05 NOTE — Assessment & Plan Note (Signed)
Stable, takes Pantoprazole,  Hgb 13.3 08/23/22

## 2023-03-05 NOTE — Assessment & Plan Note (Signed)
CTA 01/27/22 showed multiple subsegmental pulmonary emboli and pericardial effusion identified, on Eliquis, Plavix, f/u Cardiology. EF 65-70% 01/27/22. Venous US BLE no DVT.

## 2023-03-07 NOTE — Addendum Note (Signed)
 Addended by: Brek Reece X on: 03/07/2023 11:01 AM   Modules accepted: Level of Service

## 2023-03-19 DIAGNOSIS — M79671 Pain in right foot: Secondary | ICD-10-CM | POA: Diagnosis not present

## 2023-03-19 DIAGNOSIS — M79672 Pain in left foot: Secondary | ICD-10-CM | POA: Diagnosis not present

## 2023-03-19 DIAGNOSIS — L602 Onychogryphosis: Secondary | ICD-10-CM | POA: Diagnosis not present

## 2023-03-19 DIAGNOSIS — B351 Tinea unguium: Secondary | ICD-10-CM | POA: Diagnosis not present

## 2023-03-28 ENCOUNTER — Non-Acute Institutional Stay: Payer: Medicare PPO | Admitting: Nurse Practitioner

## 2023-03-28 ENCOUNTER — Encounter: Payer: Self-pay | Admitting: Nurse Practitioner

## 2023-03-28 DIAGNOSIS — Z0000 Encounter for general adult medical examination without abnormal findings: Secondary | ICD-10-CM

## 2023-03-28 DIAGNOSIS — M159 Polyosteoarthritis, unspecified: Secondary | ICD-10-CM

## 2023-03-28 NOTE — Progress Notes (Signed)
Subjective:   Adriana [name] is a 89 y.o. female who presents for Medicare Annual (Subsequent) preventive examination.  Visit Complete: In person  Patient Medicare AWV questionnaire was completed by the patient on 02/25/24; I have confirmed that all information answered by patient is correct and no changes since this date.        Objective:    Today's Vitals   03/28/23 1113 03/29/23 1155  BP: 136/72   Pulse: 75   Temp: 97.6 F (36.4 C)   SpO2: 97%   Weight: 148 lb (67.1 kg)   Height: 5\' 6"  (1.676 m)   PainSc: 1  5    Body mass index is 23.89 kg/m.     03/28/2023   11:14 AM 03/05/2023   11:01 AM 11/30/2022    2:17 PM 11/27/2022    4:55 PM 10/19/2022   12:51 PM 08/22/2022    9:06 AM 06/25/2022   11:44 AM  Advanced Directives  Does Patient Have a Medical Advance Directive? Yes Yes Yes Yes Yes Yes Yes  Type of Estate agent of [place];Living will;Out of facility DNR (pink MOST or yellow form) Healthcare Power of [place];Living will;Out of facility DNR (pink MOST or yellow form) Living will;Out of facility DNR (pink MOST or yellow form) Living will;Out of facility DNR (pink MOST or yellow form) Living will;Out of facility DNR (pink MOST or yellow form) Living will;Out of facility DNR (pink MOST or yellow form) Living will  Does patient want to make changes to medical advance directive? No - Patient declined No - Patient declined No - Patient declined No - Patient declined No - Patient declined No - Patient declined No - Patient declined  Copy of Healthcare Power of Attorney in Chart? Yes - validated most recent copy scanned in chart (See row information) Yes - validated most recent copy scanned in chart (See row information)         Current Medications (verified) Outpatient Encounter Medications as of 03/28/2023  Medication Sig   acetaminophen (TYLENOL) 325 MG tablet Take 650 mg by mouth at bedtime. 2 entries: 650 mg every 6 hours as needed   alendronate  (FOSAMAX) 70 MG tablet TAKE 1 TAB ONCE A WEEK, AT LEAST 30 MIN BEFORE 1ST FOOD.DO NOT LIE DOWN FOR 30 MIN AFTER TAKING.   amLODipine (NORVASC) 5 MG tablet Take 5 mg by mouth in the morning.   apixaban (ELIQUIS) 5 MG TABS tablet Take 2.5 mg by mouth 2 (two) times daily.   atorvastatin (LIPITOR) 20 MG tablet TAKE ONE TABLET BY MOUTH ONCE DAILY.   buPROPion (WELLBUTRIN XL) 150 MG 24 hr tablet Take 150 mg by mouth daily.   Calcium Carbonate-Vitamin D (CALCIUM 600 +D HIGH POTENCY) 600-10 MG-MCG TABS Take 1 tablet by mouth in the morning.   citalopram (CELEXA) 20 MG tablet Take 1 tablet (20 mg total) by mouth daily.   clopidogrel (PLAVIX) 75 MG tablet TAKE 1 TABLET ONCE DAILY.   diclofenac Sodium (VOLTAREN) 1 % GEL Apply 1 g topically 2 (two) times daily.   docusate sodium (COLACE) 100 MG capsule Take 100 mg by mouth at bedtime.   lisinopril (ZESTRIL) 10 MG tablet Take 10 mg by mouth daily.   nystatin cream (MYCOSTATIN) Apply to under breasts (rash) topically every 12 hours as needed for moisture associated rash Apply to under breasts (rash) topically two times a day for moisture associated rash for 2 Weeks   pantoprazole (PROTONIX) 40 MG tablet TAKE 1 TABLET ONCE  DAILY.   triamcinolone cream (KENALOG) 0.1 % Apply to under breast (rash) topically every 12 hours as needed for moisture associated rash Apply to under breasts (rash) topically two times a day for moisture associated rash for 2 Weeks   No facility-administered encounter medications on file as of 03/28/2023.    Allergies (verified) Contrast media [iodinated contrast media], Oxycontin [oxycodone], Boniva [ibandronic acid], Codeine, Darvocet [propoxyphene n-acetaminophen], and Erythromycin   History: Past Medical History:  Diagnosis Date   Anginal pain (HCC) 08/24/2010   Echo-EF =>55%,LV normal   Arthritis    Cataract 12/2006   Both eyes  Epes MD   Chest pain, unspecified 09/04/2007   Lexiscan-- EF 72%; LV normal   Constipation     Takes flax seed and honey .  Smooth Move tea.   Depression    Diverticulitis    Head injury, closed    with fall   History of cardiac cath 2012   normal coronary arteries   Hypertension    Mitral valve prolapse    Osteopenia    Stroke (HCC) 09/02/2019   Past Surgical History:  Procedure Laterality Date   ANTERIOR CERVICAL DECOMP/DISCECTOMY FUSION N/A 10/23/2012   Procedure: Cervical Five to Cervical Six, Cervical Six to Cervical Seven anterior cervical decompression with fusion plating and bonegraft;  Surgeon: Hewitt Shorts, MD;  Location: [facility];  Service: Neurosurgery;  Laterality: N/A;  ANTERIOR CERVICAL DECOMPRESSION/DISCECTOMY FUSION 2 LEVELS   BREAST CYST ASPIRATION  1990   benign   CARDIAC CATHETERIZATION  08/01/2010   normal coronaries   COLONOSCOPY  approx 2011   EYE SURGERY Bilateral 2011   intestinal blockage surgery  Feb. 2012   "kink in small intestine" per pt   Family History  Problem Relation Age of Onset   Hypertension Mother    Transient ischemic attack Mother    Parkinson's disease Mother    Dementia Mother    CVA Mother    Diabetes Father    Coronary artery disease Father    Heart failure Father    Cancer Father        prostate   Depression Father    Congestive Heart Failure Father    Prostate cancer Brother    Diabetes Brother    Prostate cancer Paternal Grandmother    Social History   Socioeconomic History   Marital status: Widowed    Spouse name: Joe   Number of children: 2   Years of education: college   Highest education level: Bachelor's degree (e.g., BA, AB, BS)  Occupational History    Comment: retired Runner, broadcasting/film/video  Tobacco Use   Smoking status: Never   Smokeless tobacco: Never  Vaping Use   Vaping status: Never Used  Substance and Sexual Activity   Alcohol use: Not Currently    Alcohol/week: 4.0 standard drinks of alcohol    Types: 2 Glasses of wine, 2 Cans of beer per week    Comment: occasionally   Drug use: No   Sexual  activity: Yes  Other Topics Concern   Not on file  Social History Narrative   Diet:  Normal   Do you drink/eat things with caffeine?   Yes   Marital status:  Widow                            What year were you married? 1986 and 1960   Do you live in a house, apartment, assisted living, condo, trailer,  etc)?  Apartment at [facility], independent living   Is it one or more stories? 1+   How many persons live in your home?  1   Do you have any pets in your home?  No   Current or past profession:  Runner, broadcasting/film/video, Environmental health practitioner at [employer] you exercise?   Yes                                                 Type & how often: Stretching,  Strengthening   Do you have a living will?  Yes   Do you have a DNR Form?   Do you have a POA/HPOA forms?  HPOA   Social Drivers of Corporate investment banker Strain: Low Risk  (09/03/2017)   Overall Financial Resource Strain (CARDIA)    Difficulty of Paying Living Expenses: Not hard at all  Food Insecurity: No Food Insecurity (09/03/2017)   Hunger Vital Sign    Worried About Running Out of Food in the Last Year: Never true    Ran Out of Food in the Last Year: Never true  Transportation Needs: No Transportation Needs (09/03/2017)   PRAPARE - Administrator, Civil Service (Medical): No    Lack of Transportation (Non-Medical): No  Physical Activity: Sufficiently Active (09/03/2017)   Exercise Vital Sign    Days of Exercise per Week: 7 days    Minutes of Exercise per Session: 40 min  Stress: Stress Concern Present (09/03/2017)   [employer] of Occupational Health - Occupational Stress Questionnaire    Feeling of Stress : To some extent  Social Connections: Somewhat Isolated (09/03/2017)   Social Connection and Isolation Panel [NHANES]    Frequency of Communication with Friends and Family: More than three times a week    Frequency of Social Gatherings with Friends and Family: More than three times a week    Attends Religious  Services: Never    Database administrator or Organizations: No    Attends Engineer, structural: Never    Marital Status: Married    Tobacco Counseling Counseling given: Not Answered   Clinical Intake:  Pre-visit preparation completed: Yes  Pain : 0-10 Pain Score: 5  Pain Location: Shoulder Pain Orientation: Left Pain Radiating Towards: none Pain Descriptors / Indicators: Aching Pain Onset: More than a month ago Pain Frequency: Intermittent Pain Relieving Factors: getting out of bed Effect of Pain on Daily Activities: less using walker for ambulation.  Pain Relieving Factors: getting out of bed  BMI - recorded: 23.89 Nutritional Status: BMI of 19-24  Normal Nutritional Risks: None Diabetes: No  How often do you need to have someone help you when you read instructions, pamphlets, or other written materials from your doctor or pharmacy?: 3 - Sometimes What is the last grade level you completed in school?: college  Interpreter Needed?: No  Information entered by :: Octavious Zidek Nedra Hai NP   Activities of Daily Living    03/28/2023    4:26 PM  In your present state of health, do you have any difficulty performing the following activities:  Hearing? 1  Vision? 0  Difficulty concentrating or making decisions? 0  Walking or climbing stairs? 1  Dressing or bathing? 1  Doing errands, shopping? 1  Preparing Food and eating ? N  Using the  Toilet? N  In the past six months, have you accidently leaked urine? Y  Do you have problems with loss of bowel control? N  Managing your Medications? Y  Managing your Finances? Y  Housekeeping or managing your Housekeeping? N    Patient Care Team: Venita Sheffield, MD as PCP - General (Internal Medicine) [name], MD as PCP - Cardiology (Cardiology) Sereen Schaff X, NP as Nurse Practitioner (Internal Medicine) Marzella Schlein., MD (Ophthalmology) Arminda Resides, MD as Consulting Physician (Dermatology) Arminda Resides, MD as Consulting Physician (Dermatology) Chevis Pretty, Dimas Aguas, MD as Consulting Physician (Gynecology)  Indicate any recent Medical Services you may have received from other than [facility] providers in the past year (date may be approximate).     Assessment:   This is a routine wellness examination for Adriana [name].  Hearing/Vision screen No results found.   Goals Addressed             This Visit's Progress    Maintain Mobility and Function       Evidence-based guidance:  Acknowledge and validate impact of pain, loss of strength and potential disfigurement (hand osteoarthritis) on mental health and daily life, such as social isolation, anxiety, depression, impaired sexual relationship and   injury from falls.  Anticipate referral to physical or occupational therapy for assessment, therapeutic exercise and recommendation for adaptive equipment or assistive devices; encourage participation.  Assess impact on ability to perform activities of daily living, as well as engage in sports and leisure events or requirements of work or school.  Provide anticipatory guidance and reassurance about the benefit of exercise to maintain function; acknowledge and normalize fear that exercise may worsen symptoms.  Encourage regular exercise, at least 10 minutes at a time for 45 minutes per week; consider yoga, water exercise and proprioceptive exercises; encourage use of wearable activity tracker to increase motivation and adherence.  Encourage maintenance or resumption of daily activities, including employment, as pain allows and with minimal exposure to trauma.  Assist patient to advocate for adaptations to the work environment.  Consider level of pain and function, gender, age, lifestyle, patient preference, quality of life, readiness and ?ocapacity to benefit? when recommending patients for orthopaedic surgery consultation.  Explore strategies, such as changes to medication regimen or activity that enables  patient to anticipate and manage flare-ups that increase deconditioning and disability.  Explore patient preferences; encourage exposure to a broader range of activities that have been avoided for fear of experiencing pain.  Identify barriers to participation in therapy or exercise, such as pain with activity, anticipated or imagined pain.  Monitor postoperative joint replacement or any preexisting joint replacement for ongoing pain and loss of function; provide social support and encouragement throughout recovery.   Notes:        Depression Screen    03/28/2023    4:27 PM 08/22/2022    9:06 AM 04/05/2020    9:31 AM 04/28/2019    9:11 AM 09/03/2017   12:37 PM  PHQ 2/9 Scores  PHQ - 2 Score 0 0 2 0 4  PHQ- 9 Score   5  9  Exception Documentation  Other- indicate reason in comment box       Fall Risk    08/22/2022    9:06 AM 03/16/2022    2:03 PM 03/13/2022    9:11 AM 11/01/2021    1:38 PM 09/27/2021    2:10 PM  Fall Risk   Falls in the past year? 0 0 0 1 0  Number  falls in past yr: 0 0 0 0 0  Injury with Fall? 0 0 0 0 0  Risk for fall due to : No Fall Risks No Fall Risks No Fall Risks History of fall(s) History of fall(s)  Follow up  Falls evaluation completed Falls evaluation completed      MEDICARE RISK AT HOME: Medicare Risk at Home Any stairs in or around the home?: Yes If so, are there any without handrails?: No Home free of loose throw rugs in walkways, pet beds, electrical cords, etc?: No Adequate lighting in your home to reduce risk of falls?: Yes Life alert?: No Use of a cane, walker or w/c?: Yes Grab bars in the bathroom?: Yes Shower chair or bench in shower?: Yes Elevated toilet seat or a handicapped toilet?: Yes  TIMED UP AND GO:  Was the test performed?  Yes  Length of time to ambulate 10 feet: 15 sec Gait slow and steady with assistive device    Cognitive Function:    03/19/2022   12:00 PM 09/03/2017   12:46 PM  MMSE - Mini Mental State Exam  Orientation to  time  5  Orientation to Place 5 5  Registration 3 3  Attention/ Calculation 5 5  Recall 3 2  Language- name 2 objects 2 2  Language- repeat 1 1  Language- follow 3 step command 3 3  Language- read & follow direction 1 1  Write a sentence 1 1  Copy design 1 1  Total score  29        04/05/2020    9:37 AM  6CIT Screen  What Year? 0 points  What month? 0 points  What time? 0 points  Count back from 20 0 points  Months in reverse 0 points  Repeat phrase 0 points  Total Score 0 points    Immunizations Immunization History  Administered Date(s) Administered   Fluad Quad(high Dose 65+) 12/17/2018   Influenza, High Dose Seasonal PF 12/08/2016, 12/19/2020   Influenza-Unspecified 12/16/2019, 12/25/2021, 12/05/2022   Moderna Sars-Covid-2 Vaccination 03/09/2019, 04/06/2019, 01/12/2020, 12/19/2022   Pfizer Covid-19 Vaccine Bivalent Booster 65yrs & up 01/04/2022   Pneumococcal Conjugate-13 06/12/2013   Pneumococcal Polysaccharide-23 07/28/2002   RSV,unspecified 04/05/2022   Tdap 07/27/2009   Zoster Recombinant(Shingrix) 01/08/2017, 06/03/2017    TDAP status: Due, Education has been provided regarding the importance of this vaccine. Advised may receive this vaccine at local pharmacy or Health Dept. Aware to provide a copy of the vaccination record if obtained from local pharmacy or Health Dept. Verbalized acceptance and understanding.  Flu Vaccine status: Up to date  Pneumococcal vaccine status: Up to date  Covid-19 vaccine status: Completed vaccines  Qualifies for Shingles Vaccine? Yes   Zostavax completed Yes   Shingrix Completed?: Yes  Screening Tests Health Maintenance  Topic Date Due   DTaP/Tdap/Td (2 - Td or Tdap) 07/28/2019   COVID-19 Vaccine (6 - 2024-25 season) 02/13/2023   Medicare Annual Wellness (AWV)  03/27/2024   Pneumonia Vaccine 45+ Years old  Completed   INFLUENZA VACCINE  Completed   DEXA SCAN  Completed   Zoster Vaccines- Shingrix  Completed   HPV  VACCINES  Aged Out    Health Maintenance  Health Maintenance Due  Topic Date Due   DTaP/Tdap/Td (2 - Td or Tdap) 07/28/2019   COVID-19 Vaccine (6 - 2024-25 season) 02/13/2023    Colorectal cancer screening: No longer required.   Mammogram status: No longer required due to aged out.  Bone Density status:  Completed 10/25/20. Results reflect: Bone density results: OSTEOPENIA. Repeat every 2 years.  Lung Cancer Screening: (Low Dose CT Chest recommended if Age 44-80 years, 20 pack-year currently smoking OR have quit w/in 15years.) does not qualify.   Lung Cancer Screening Referral: NA  Additional Screening:  Hepatitis C Screening: does not qualify;   Vision Screening: Recommended annual ophthalmology exams for early detection of glaucoma and other disorders of the eye. Is the patient up to date with their annual eye exam?  Yes  Who is the provider or what is the name of the office in which the patient attends annual eye exams? HPOA will provide If pt is not established with a provider, would they like to be referred to a provider to establish care? No .   Dental Screening: Recommended annual dental exams for proper oral hygiene  Diabetic Foot Exam: NA  Community Resource Referral / Chronic Care Management: CRR required this visit?  No   CCM required this visit?  No     Plan:     I have personally reviewed and noted the following in the patient's chart:   Medical and social history Use of alcohol, tobacco or illicit drugs  Current medications and supplements including opioid prescriptions. Patient is not currently taking opioid prescriptions. Functional ability and status Nutritional status Physical activity Advanced directives List of other physicians Hospitalizations, surgeries, and ER visits in previous 12 months Vitals Screenings to include cognitive, depression, and falls Referrals and appointments  In addition, I have reviewed and discussed with patient  certain preventive protocols, quality metrics, and best practice recommendations. A written personalized care plan for preventive services as well as general preventive health recommendations were provided to patient.     Tiwanna Tuch X Derinda Bartus, NP   03/29/2023   After Visit Summary: (In Person-Declined) Patient declined AVS at this time.

## 2023-03-29 ENCOUNTER — Encounter: Payer: Self-pay | Admitting: Nurse Practitioner

## 2023-04-04 DIAGNOSIS — M25512 Pain in left shoulder: Secondary | ICD-10-CM | POA: Diagnosis not present

## 2023-04-12 DIAGNOSIS — F411 Generalized anxiety disorder: Secondary | ICD-10-CM | POA: Diagnosis not present

## 2023-04-12 DIAGNOSIS — F330 Major depressive disorder, recurrent, mild: Secondary | ICD-10-CM | POA: Diagnosis not present

## 2023-04-16 DIAGNOSIS — L821 Other seborrheic keratosis: Secondary | ICD-10-CM | POA: Diagnosis not present

## 2023-04-16 DIAGNOSIS — L570 Actinic keratosis: Secondary | ICD-10-CM | POA: Diagnosis not present

## 2023-04-16 DIAGNOSIS — D692 Other nonthrombocytopenic purpura: Secondary | ICD-10-CM | POA: Diagnosis not present

## 2023-04-22 DIAGNOSIS — H35033 Hypertensive retinopathy, bilateral: Secondary | ICD-10-CM | POA: Diagnosis not present

## 2023-04-22 DIAGNOSIS — H524 Presbyopia: Secondary | ICD-10-CM | POA: Diagnosis not present

## 2023-04-22 DIAGNOSIS — H52223 Regular astigmatism, bilateral: Secondary | ICD-10-CM | POA: Diagnosis not present

## 2023-04-22 DIAGNOSIS — H5203 Hypermetropia, bilateral: Secondary | ICD-10-CM | POA: Diagnosis not present

## 2023-04-22 DIAGNOSIS — I10 Essential (primary) hypertension: Secondary | ICD-10-CM | POA: Diagnosis not present

## 2023-04-22 DIAGNOSIS — H26493 Other secondary cataract, bilateral: Secondary | ICD-10-CM | POA: Diagnosis not present

## 2023-04-22 DIAGNOSIS — H53461 Homonymous bilateral field defects, right side: Secondary | ICD-10-CM | POA: Diagnosis not present

## 2023-05-08 DIAGNOSIS — M7542 Impingement syndrome of left shoulder: Secondary | ICD-10-CM | POA: Diagnosis not present

## 2023-05-15 ENCOUNTER — Encounter: Payer: Self-pay | Admitting: Adult Health

## 2023-05-15 ENCOUNTER — Non-Acute Institutional Stay: Payer: Self-pay | Admitting: Adult Health

## 2023-05-15 DIAGNOSIS — I10 Essential (primary) hypertension: Secondary | ICD-10-CM | POA: Diagnosis not present

## 2023-05-15 DIAGNOSIS — K219 Gastro-esophageal reflux disease without esophagitis: Secondary | ICD-10-CM

## 2023-05-15 DIAGNOSIS — Z8673 Personal history of transient ischemic attack (TIA), and cerebral infarction without residual deficits: Secondary | ICD-10-CM

## 2023-05-15 NOTE — Progress Notes (Signed)
 Location:  [facility] Room Number: [id number] Place of Service:  ALF [phone]) Provider: Medina-Vargas,Dinnis Rog C,NP  Venita Sheffield, MD  Patient Care Team: Venita Sheffield, MD as PCP - General (Internal Medicine) [name], MD as PCP - Cardiology (Cardiology) [name], [name], NP as Nurse Practitioner (Internal Medicine) Marzella Schlein., MD (Ophthalmology) Arminda Resides, MD as Consulting Physician (Dermatology) Arminda Resides, MD as Consulting Physician (Dermatology) Teodora Medici, MD as Consulting Physician (Gynecology)  Extended Emergency Contact Information Primary Emergency Contact: Aline Brochure [place] of [place] Phone: [phone] Relation: Daughter Secondary Emergency Contact: Black,Joel Mobile Phone: [phone] Relation: Son  Code Status:  DNR Goals of care: Advanced Directive information    05/15/2023   11:30 AM  Advanced Directives  Does Patient Have a Medical Advance Directive? Yes  Type of Estate agent of [place];Living will;Out of facility DNR (pink MOST or yellow form)  Does patient want to make changes to medical advance directive? No - Patient declined  Copy of Healthcare Power of Attorney in Chart? Yes - validated most recent copy scanned in chart (See row information)     Chief Complaint  Patient presents with   Acute Visit    Cough    HPI:  Pt is a 89 y.o. female seen today for an acute visit for cough. She is a resident of [facility]. She complains of right-sided pain with coughing. She stated that when she eats, she feel a tickle on her throat so she starts coughing.    Deep knee 130/17, takes Norvasc 5 mg daily and lisinopril 10 mg daily for hypertension.  She takes Eliquis 2.5 mg BID, Plavix 75 mg daily for history of CVA.  Past Medical History:  Diagnosis Date   Anginal pain (HCC) 08/24/2010   Echo-EF =>55%,LV normal   Arthritis    Cataract 12/2006    Both eyes  Epes MD   Chest pain, unspecified 09/04/2007   Lexiscan-- EF 72%; LV normal   Constipation    Takes flax seed and honey .  Smooth Move tea.   Depression    Diverticulitis    Head injury, closed    with fall   History of cardiac cath 2012   normal coronary arteries   Hypertension    Mitral valve prolapse    Osteopenia    Stroke (HCC) 09/02/2019   Past Surgical History:  Procedure Laterality Date   ANTERIOR CERVICAL DECOMP/DISCECTOMY FUSION N/A 10/23/2012   Procedure: Cervical Five to Cervical Six, Cervical Six to Cervical Seven anterior cervical decompression with fusion plating and bonegraft;  Surgeon: Hewitt Shorts, MD;  Location: [facility];  Service: Neurosurgery;  Laterality: N/A;  ANTERIOR CERVICAL DECOMPRESSION/DISCECTOMY FUSION 2 LEVELS   BREAST CYST ASPIRATION  1990   benign   CARDIAC CATHETERIZATION  08/01/2010   normal coronaries   COLONOSCOPY  approx 2011   EYE SURGERY Bilateral 2011   intestinal blockage surgery  Feb. 2012   "kink in small intestine" per pt    Allergies  Allergen Reactions   Contrast Media [Iodinated Contrast Media] Hives    Hives during IVP at urology office '02, requires 13 hr prep///a.[email or web address]  Patient came thru ER and had a 1 hour pre-medication and did fine   Oxycontin [Oxycodone] Other (See Comments)    Decreased appetite.   Boniva [Ibandronate] Nausea And Vomiting and Other (See Comments)    Weakness    Codeine Diarrhea and Nausea And Vomiting   Darvocet [Propoxyphene  N-Acetaminophen] Diarrhea and Nausea And Vomiting   Erythromycin Nausea And Vomiting    Outpatient Encounter Medications as of 05/15/2023  Medication Sig   acetaminophen (TYLENOL) 325 MG tablet Take 650 mg by mouth at bedtime. 2 entries: 650 mg every 6 hours as needed   alendronate (FOSAMAX) 70 MG tablet TAKE 1 TAB ONCE A WEEK, AT LEAST 30 MIN BEFORE 1ST FOOD.DO NOT LIE DOWN FOR 30 MIN AFTER TAKING.   amLODipine (NORVASC) 5 MG tablet Take 5 mg by  mouth in the morning.   apixaban (ELIQUIS) 5 MG TABS tablet Take 2.5 mg by mouth 2 (two) times daily.   atorvastatin (LIPITOR) 20 MG tablet TAKE ONE TABLET BY MOUTH ONCE DAILY.   buPROPion (WELLBUTRIN XL) 150 MG 24 hr tablet Take 150 mg by mouth daily.   Calcium Carbonate-Vitamin D (CALCIUM 600 +D HIGH POTENCY) 600-10 MG-MCG TABS Take 1 tablet by mouth in the morning.   citalopram (CELEXA) 20 MG tablet Take 1 tablet (20 mg total) by mouth daily.   clopidogrel (PLAVIX) 75 MG tablet TAKE 1 TABLET ONCE DAILY.   diclofenac Sodium (VOLTAREN) 1 % GEL Apply 1 g topically 2 (two) times daily.   docusate sodium (COLACE) 100 MG capsule Take 100 mg by mouth at bedtime.   lisinopril (ZESTRIL) 10 MG tablet Take 10 mg by mouth daily.   nystatin cream (MYCOSTATIN) Apply to under breasts (rash) topically every 12 hours as needed for moisture associated rash Apply to under breasts (rash) topically two times a day for moisture associated rash for 2 Weeks   pantoprazole (PROTONIX) 40 MG tablet TAKE 1 TABLET ONCE DAILY.   triamcinolone cream (KENALOG) 0.1 % Apply to under breast (rash) topically every 12 hours as needed for moisture associated rash Apply to under breasts (rash) topically two times a day for moisture associated rash for 2 Weeks   No facility-administered encounter medications on file as of 05/15/2023.    Review of Systems  Constitutional:  Negative for appetite change, chills, fatigue and fever.  HENT:  Negative for congestion, hearing loss, rhinorrhea and sore throat.   Eyes: Negative.   Respiratory:  Positive for cough. Negative for shortness of breath and wheezing.   Cardiovascular:  Negative for chest pain, palpitations and leg swelling.  Gastrointestinal:  Negative for abdominal pain, constipation, diarrhea, nausea and vomiting.  Genitourinary:  Negative for dysuria.  Musculoskeletal:  Negative for arthralgias, back pain and myalgias.  Skin:  Negative for color change, rash and wound.   Neurological:  Negative for dizziness, weakness and headaches.  Psychiatric/Behavioral:  Negative for behavioral problems. The patient is not nervous/anxious.     Immunization History  Administered Date(s) Administered   Fluad Quad(high Dose 65+) 12/17/2018   Influenza, High Dose Seasonal PF 12/08/2016, 12/19/2020   Influenza-Unspecified 12/16/2019, 12/25/2021, 12/05/2022   Moderna Sars-Covid-2 Vaccination 03/09/2019, 04/06/2019, 01/12/2020, 12/19/2022   Pfizer Covid-19 Vaccine Bivalent Booster 44yrs & up 01/04/2022   Pneumococcal Conjugate-13 06/12/2013   Pneumococcal Polysaccharide-23 07/28/2002   RSV,unspecified 04/05/2022   Tdap 07/27/2009, 04/03/2023   Zoster Recombinant(Shingrix) 01/08/2017, 06/03/2017   Pertinent  Health Maintenance Due  Topic Date Due   INFLUENZA VACCINE  Completed   DEXA SCAN  Completed      01/27/2022   10:25 AM 01/28/2022    8:15 AM 03/13/2022    9:11 AM 03/16/2022    2:03 PM 08/22/2022    9:06 AM  Fall Risk  Falls in the past year?   0 0 0  Was there  an injury with Fall?   0 0 0  Fall Risk Category Calculator   0 0 0  Fall Risk Category (Retired)   Low Low   (RETIRED) Patient Fall Risk Level Low fall risk Moderate fall risk Low fall risk Low fall risk   Patient at Risk for Falls Due to   No Fall Risks No Fall Risks No Fall Risks  Fall risk Follow up   Falls evaluation completed Falls evaluation completed    Functional Status Survey:    Vitals:   05/15/23 1133  BP: 130/71  Pulse: 62  Resp: 18  Temp: 98.2 F (36.8 C)  SpO2: 96%  Weight: 148 lb 9.6 oz (67.4 kg)  Height: 5\' 6"  (1.676 m)   Body mass index is 23.98 kg/m. Physical Exam Constitutional:      General: She is not in acute distress.    Appearance: Normal appearance.  HENT:     Head: Normocephalic and atraumatic.     Nose: Nose normal.     Mouth/Throat:     Mouth: Mucous membranes are moist.  Eyes:     Conjunctiva/sclera: Conjunctivae normal.  Cardiovascular:     Rate  and Rhythm: Normal rate and regular rhythm.  Pulmonary:     Effort: Pulmonary effort is normal.     Breath sounds: Normal breath sounds.  Abdominal:     General: Bowel sounds are normal.     Palpations: Abdomen is soft.  Musculoskeletal:     Cervical back: Normal range of motion.  Skin:    General: Skin is warm and dry.  Neurological:     Mental Status: She is alert and oriented to person, place, and time.     Comments: Left leg weakness, walks with walker  Psychiatric:        Mood and Affect: Mood normal.        Behavior: Behavior normal.     Labs reviewed: Recent Labs    06/25/22 0000 08/23/22 0000 03/05/23 0000  NA 138 140 138  K 3.9 3.9 3.6  CL 106 103 103  CO2 26* 31* 25*  BUN 15 16 24*  CREATININE 0.7 0.9 1.0  CALCIUM 9.7 9.8 9.2   Recent Labs    08/23/22 0000 03/05/23 0000  AST 19 25  ALT 11 19  ALKPHOS 62 43  ALBUMIN 4.4 3.8   Recent Labs    06/25/22 0000 08/23/22 0000 03/05/23 0000  WBC 5.9 5.0 6.1  NEUTROABS 3,776.00 2,875.00 4,477.00  HGB 12.7 13.3 12.1  HCT 38 40 37  PLT 245 234 229   Lab Results  Component Value Date   TSH 2.71 08/23/2022   Lab Results  Component Value Date   HGBA1C 5.3 09/03/2019   Lab Results  Component Value Date   CHOL 124 08/23/2022   HDL 51 08/23/2022   LDLCALC 58 08/23/2022   TRIG 70 08/23/2022   CHOLHDL 2.7 10/19/2021    Significant Diagnostic Results in last 30 days:  No results found.  Assessment/Plan  1. Gastroesophageal reflux disease without esophagitis (Primary) -  will increase Protonix 40 mg to twice a day x 1 month then back to daily  2. Primary hypertension -   BP controlled -   Continue Norvasc and lisinopril  3. History of CVA (cerebrovascular accident) -  stable Continue Eliquis and Plavix       Family/ staff Communication: Discussed plan of care with resident and charge nurse.  Labs/tests ordered: Hepatic panel

## 2023-05-16 DIAGNOSIS — N183 Chronic kidney disease, stage 3 unspecified: Secondary | ICD-10-CM | POA: Diagnosis not present

## 2023-05-17 ENCOUNTER — Non-Acute Institutional Stay: Payer: Self-pay | Admitting: Nurse Practitioner

## 2023-05-17 ENCOUNTER — Encounter: Payer: Self-pay | Admitting: Nurse Practitioner

## 2023-05-17 DIAGNOSIS — R001 Bradycardia, unspecified: Secondary | ICD-10-CM | POA: Diagnosis not present

## 2023-05-17 DIAGNOSIS — K219 Gastro-esophageal reflux disease without esophagitis: Secondary | ICD-10-CM

## 2023-05-17 DIAGNOSIS — I10 Essential (primary) hypertension: Secondary | ICD-10-CM | POA: Diagnosis not present

## 2023-05-17 DIAGNOSIS — R1310 Dysphagia, unspecified: Secondary | ICD-10-CM

## 2023-05-17 DIAGNOSIS — E785 Hyperlipidemia, unspecified: Secondary | ICD-10-CM

## 2023-05-17 DIAGNOSIS — J310 Chronic rhinitis: Secondary | ICD-10-CM | POA: Insufficient documentation

## 2023-05-17 DIAGNOSIS — J00 Acute nasopharyngitis [common cold]: Secondary | ICD-10-CM | POA: Diagnosis not present

## 2023-05-17 NOTE — Progress Notes (Signed)
 Location:   [facility] Room Number: 904 Place of Service:  ALF (13) Provider: Arna Snipe Ysela Hettinger NP  Venita Sheffield, MD  Patient Care Team: Venita Sheffield, MD as PCP - General (Internal Medicine) [name], MD as PCP - Cardiology (Cardiology) Iria Jamerson X, NP as Nurse Practitioner (Internal Medicine) Marzella Schlein., MD (Ophthalmology) Arminda Resides, MD as Consulting Physician (Dermatology) Arminda Resides, MD as Consulting Physician (Dermatology) Teodora Medici, MD as Consulting Physician (Gynecology)  Extended Emergency Contact Information Primary Emergency Contact: Aline Brochure [place] of [place] Mobile Phone: [phone] Relation: Daughter Secondary Emergency Contact: Black,Joel Mobile Phone: [phone] Relation: Son  Code Status: DNR Goals of care: Advanced Directive information    05/15/2023   11:30 AM  Advanced Directives  Does Patient Have a Medical Advance Directive? Yes  Type of Estate agent of [place];Living will;Out of facility DNR (pink MOST or yellow form)  Does patient want to make changes to medical advance directive? No - Patient declined  Copy of Healthcare Power of Attorney in Chart? Yes - validated most recent copy scanned in chart (See row information)     Chief Complaint  Patient presents with   Acute Visit    Running nose x 2-3 days.     HPI:  Pt is a 89 y.o. female seen today for an acute visit for running nose about 2-3 days, denied change in her chronic cough, denied headache, sinus pressure, sore throat, chest pain, palpitation, SOB, nausea, vomiting, or abd pain. She is afebrile, no O2 desaturation.   Bradycardia, reviewed EKG HR 53 2021, HR 88 2023, 10/23/22 EKG SR, vent rate 61, normalized after dc'd Metoprolol, followed by Cardiology              CTA 01/27/22 showed multiple subsegmental pulmonary emboli and pericardial effusion identified, on Eliquis, Plavix, f/u Cardiology. EF  65-70% 01/27/22. Venous US BLE no DVT.              HTN, takes Amlodipine, Lisinopril, off Metoprolol 2/2 HR 50s and c/o daytime sleepiness. Bun/creat 24/2.03 03/05/23             Constipation, takes Colace                               GERD, takes Pantoprazole,  Hgb 12.1 03/05/23             Dysphagia, sometimes coughs when swallowing, worked with ST.              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 Takes Plavix. Atorvastatin. affected gait(balance issues and vision issues(seeing Ophthalmology), feels weak left leg.              Gait abnormality, falls, uses walker in her room, power w/c to go further.              Chronic occasional dry cough for about a year, left side throat tickles sometimes during eating, then cough, ? Resultant of CVA.              OP, takes Fosamax, Ca, Vit D, t score -2.3 10/25/20, 06/13/23 DEXA             Depression, takes Citalopram, Wellbutrin,  f/u psycho therapy, TSH 2.71 08/23/22             Pericardial effusion, Hx of it, present again on CTA  01/27/22             Hyperlipidemia, takes Atorvastatin, LDL 58 08/23/22             OA pain, Using walker, power w/c             Urinary incontinence, progressing   Past Medical History:  Diagnosis Date   Anginal pain (HCC) 08/24/2010   Echo-EF =>55%,LV normal   Arthritis    Cataract 12/2006   Both eyes  Epes MD   Chest pain, unspecified 09/04/2007   Lexiscan-- EF 72%; LV normal   Constipation    Takes flax seed and honey .  Smooth Move tea.   Depression    Diverticulitis    Head injury, closed    with fall   History of cardiac cath 2012   normal coronary arteries   Hypertension    Mitral valve prolapse    Osteopenia    Stroke (HCC) 09/02/2019   Past Surgical History:  Procedure Laterality Date   ANTERIOR CERVICAL DECOMP/DISCECTOMY FUSION N/A 10/23/2012   Procedure: Cervical Five to Cervical Six, Cervical Six to Cervical Seven anterior cervical  decompression with fusion plating and bonegraft;  Surgeon: Hewitt Shorts, MD;  Location: [facility];  Service: Neurosurgery;  Laterality: N/A;  ANTERIOR CERVICAL DECOMPRESSION/DISCECTOMY FUSION 2 LEVELS   BREAST CYST ASPIRATION  1990   benign   CARDIAC CATHETERIZATION  08/01/2010   normal coronaries   COLONOSCOPY  approx 2011   EYE SURGERY Bilateral 2011   intestinal blockage surgery  Feb. 2012   "kink in small intestine" per pt    Allergies  Allergen Reactions   Contrast Media [Iodinated Contrast Media] Hives    Hives during IVP at urology office '02, requires 13 hr prep///a.[email or web address]  Patient came thru ER and had a 1 hour pre-medication and did fine   Oxycontin [Oxycodone] Other (See Comments)    Decreased appetite.   Boniva [Ibandronate] Nausea And Vomiting and Other (See Comments)    Weakness    Codeine Diarrhea and Nausea And Vomiting   Darvocet [Propoxyphene N-Acetaminophen] Diarrhea and Nausea And Vomiting   Erythromycin Nausea And Vomiting    Allergies as of 05/17/2023       Reactions   Contrast Media [iodinated Contrast Media] Hives   Hives during IVP at urology office '02, requires 13 hr prep///a.[email or web address] Patient came thru ER and had a 1 hour pre-medication and did fine   Oxycontin [oxycodone] Other (See Comments)   Decreased appetite.   Boniva [ibandronate] Nausea And Vomiting, Other (See Comments)   Weakness    Codeine Diarrhea, Nausea And Vomiting   Darvocet [propoxyphene N-acetaminophen] Diarrhea, Nausea And Vomiting   Erythromycin Nausea And Vomiting        Medication List        Accurate as of May 17, 2023 12:08 PM. If you have any questions, ask your nurse or doctor.          acetaminophen 325 MG tablet Commonly known as: TYLENOL Take 650 mg by mouth at bedtime. 2 entries: 650 mg every 6 hours as needed   alendronate 70 MG tablet Commonly known as: FOSAMAX TAKE 1 TAB ONCE A WEEK, AT LEAST 30 MIN BEFORE 1ST FOOD.DO NOT LIE DOWN FOR 30  MIN AFTER TAKING.   amLODipine 5 MG tablet Commonly known as: NORVASC Take 5 mg by mouth in the morning.   atorvastatin 20 MG tablet Commonly known as: LIPITOR TAKE ONE TABLET BY MOUTH ONCE DAILY.  buPROPion 150 MG 24 hr tablet Commonly known as: WELLBUTRIN XL Take 150 mg by mouth daily.   Calcium 600 +D High Potency 600-10 MG-MCG Tabs Generic drug: Calcium Carbonate-Vitamin D Take 1 tablet by mouth in the morning.   citalopram 20 MG tablet Commonly known as: CELEXA Take 1 tablet (20 mg total) by mouth daily.   clopidogrel 75 MG tablet Commonly known as: PLAVIX TAKE 1 TABLET ONCE DAILY.   diclofenac Sodium 1 % Gel Commonly known as: VOLTAREN Apply 1 g topically 2 (two) times daily.   docusate sodium 100 MG capsule Commonly known as: COLACE Take 100 mg by mouth at bedtime.   Eliquis 5 MG Tabs tablet Generic drug: apixaban Take 2.5 mg by mouth 2 (two) times daily.   lisinopril 10 MG tablet Commonly known as: ZESTRIL Take 10 mg by mouth daily.   nystatin cream Commonly known as: MYCOSTATIN Apply to under breasts (rash) topically every 12 hours as needed for moisture associated rash Apply to under breasts (rash) topically two times a day for moisture associated rash for 2 Weeks   pantoprazole 40 MG tablet Commonly known as: PROTONIX TAKE 1 TABLET ONCE DAILY.   triamcinolone cream 0.1 % Commonly known as: KENALOG Apply to under breast (rash) topically every 12 hours as needed for moisture associated rash Apply to under breasts (rash) topically two times a day for moisture associated rash for 2 Weeks        Review of Systems  Constitutional:  Negative for appetite change, fatigue and fever.  HENT:  Positive for congestion, hearing loss and rhinorrhea. Negative for sinus pressure, sinus pain, sore throat, trouble swallowing and voice change.   Eyes:  Negative for visual disturbance.  Respiratory:  Positive for cough. Negative for chest tightness, shortness of  breath and wheezing.        Chronic occasional cough with clear phlegm. DOE since 01/27/22  Cardiovascular:  Negative for leg swelling.  Gastrointestinal:  Negative for abdominal pain and constipation.  Genitourinary:  Negative for dysuria, frequency and urgency.  Musculoskeletal:  Positive for arthralgias, back pain and gait problem.  Skin:  Negative for color change.  Neurological:  Positive for weakness. Negative for speech difficulty and headaches.       Memory lapses occasionally. Left sided weakness.   Psychiatric/Behavioral:  Negative for behavioral problems and sleep disturbance. The patient is not nervous/anxious.     Immunization History  Administered Date(s) Administered   Fluad Quad(high Dose 65+) 12/17/2018   Influenza, High Dose Seasonal PF 12/08/2016, 12/19/2020   Influenza-Unspecified 12/16/2019, 12/25/2021, 12/05/2022   Moderna Sars-Covid-2 Vaccination 03/09/2019, 04/06/2019, 01/12/2020, 12/19/2022   Pfizer Covid-19 Vaccine Bivalent Booster 82yrs & up 01/04/2022   Pneumococcal Conjugate-13 06/12/2013   Pneumococcal Polysaccharide-23 07/28/2002   RSV,unspecified 04/05/2022   Tdap 07/27/2009, 04/03/2023   Zoster Recombinant(Shingrix) 01/08/2017, 06/03/2017   Pertinent  Health Maintenance Due  Topic Date Due   INFLUENZA VACCINE  Completed   DEXA SCAN  Completed      01/27/2022   10:25 AM 01/28/2022    8:15 AM 03/13/2022    9:11 AM 03/16/2022    2:03 PM 08/22/2022    9:06 AM  Fall Risk  Falls in the past year?   0 0 0  Was there an injury with Fall?   0 0 0  Fall Risk Category Calculator   0 0 0  Fall Risk Category (Retired)   Low Low   (RETIRED) Patient Fall Risk Level Low fall risk Moderate fall  risk Low fall risk Low fall risk   Patient at Risk for Falls Due to   No Fall Risks No Fall Risks No Fall Risks  Fall risk Follow up   Falls evaluation completed Falls evaluation completed    Functional Status Survey:    Vitals:   05/17/23 1048  BP: 130/71   Pulse: 62  Resp: 18  Temp: 98.2 F (36.8 C)  SpO2: 96%  Weight: 148 lb (67.1 kg)   Body mass index is 23.89 kg/m. Physical Exam Vitals and nursing note reviewed.  Constitutional:      Appearance: Normal appearance.  HENT:     Head: Normocephalic and atraumatic.     Nose: Congestion and rhinorrhea present.     Mouth/Throat:     Mouth: Mucous membranes are moist.     Pharynx: No oropharyngeal exudate or posterior oropharyngeal erythema.  Eyes:     Extraocular Movements: Extraocular movements intact.     Conjunctiva/sclera: Conjunctivae normal.     Pupils: Pupils are equal, round, and reactive to light.     Comments: Lateral right eye peripheral vision field loss since CVA 11/2018.  Cardiovascular:     Rate and Rhythm: Normal rate and regular rhythm.     Heart sounds: No murmur heard. Pulmonary:     Effort: Pulmonary effort is normal.     Breath sounds: No rales.  Abdominal:     General: Bowel sounds are normal.     Palpations: Abdomen is soft.     Tenderness: There is no abdominal tenderness.  Musculoskeletal:     Cervical back: Normal range of motion and neck supple.     Right lower leg: No edema.     Left lower leg: No edema.     Comments: Kyphoscoliosis. Left shoulder over head ROM decreased.   Skin:    General: Skin is warm and dry.     Comments:    Neurological:     Mental Status: She is alert and oriented to person, place, and time. Mental status is at baseline.     Motor: Weakness present.     Coordination: Coordination normal.     Gait: Gait abnormal.     Deep Tendon Reflexes: Reflexes normal.     Comments: Walker for ambulation. Left sided weakness with muscle strength 5/5.  Psychiatric:        Mood and Affect: Mood normal.        Behavior: Behavior normal.     Labs reviewed: Recent Labs    06/25/22 0000 08/23/22 0000 03/05/23 0000  NA 138 140 138  K 3.9 3.9 3.6  CL 106 103 103  CO2 26* 31* 25*  BUN 15 16 24*  CREATININE 0.7 0.9 1.0  CALCIUM  9.7 9.8 9.2   Recent Labs    08/23/22 0000 03/05/23 0000  AST 19 25  ALT 11 19  ALKPHOS 62 43  ALBUMIN 4.4 3.8   Recent Labs    06/25/22 0000 08/23/22 0000 03/05/23 0000  WBC 5.9 5.0 6.1  NEUTROABS 3,776.00 2,875.00 4,477.00  HGB 12.7 13.3 12.1  HCT 38 40 37  PLT 245 234 229   Lab Results  Component Value Date   TSH 2.71 08/23/2022   Lab Results  Component Value Date   HGBA1C 5.3 09/03/2019   Lab Results  Component Value Date   CHOL 124 08/23/2022   HDL 51 08/23/2022   LDLCALC 58 08/23/2022   TRIG 70 08/23/2022   CHOLHDL 2.7 10/19/2021  Significant Diagnostic Results in last 30 days:  No results found.  Assessment/Plan: Rhinitis  running nose about 2-3 days, denied change in her chronic cough, denied headache, sinus pressure, sore throat, chest pain, palpitation, SOB, nausea, vomiting, or abd pain. She is afebrile, no O2 desaturation.    Try Claritin 10mg  every day, Flonase I spray R+L nostril bid x 7 days.   Bradycardia HR normalized since off Metoprolol, followed by Cardiology  Hypertension  blood pressure is controlled, takes Amlodipine, Lisinopril, off Metoprolol 2/2 HR 50s and c/o daytime sleepiness. Bun/creat 24/2.03 03/05/23  GERD (gastroesophageal reflux disease) takes Pantoprazole,  Hgb 12.1 03/05/23  Dysphagia  sometimes coughs when swallowing, worked with ST.     Family/ staff Communication: plan of care reviewed with the patient and charge nurse.   Labs/tests ordered:  none

## 2023-05-17 NOTE — Assessment & Plan Note (Signed)
 sometimes coughs when swallowing, worked with ST.

## 2023-05-17 NOTE — Assessment & Plan Note (Signed)
 takes Pantoprazole,  Hgb 12.1 03/05/23

## 2023-05-17 NOTE — Assessment & Plan Note (Signed)
 HR normalized since off Metoprolol, followed by Cardiology

## 2023-05-17 NOTE — Assessment & Plan Note (Signed)
 running nose about 2-3 days, denied change in her chronic cough, denied headache, sinus pressure, sore throat, chest pain, palpitation, SOB, nausea, vomiting, or abd pain. She is afebrile, no O2 desaturation.    Try Claritin 10mg  every day, Flonase I spray R+L nostril bid x 7 days.

## 2023-05-17 NOTE — Assessment & Plan Note (Signed)
 blood pressure is controlled, takes Amlodipine, Lisinopril, off Metoprolol 2/2 HR 50s and c/o daytime sleepiness. Bun/creat 24/2.03 03/05/23

## 2023-05-21 DIAGNOSIS — M6281 Muscle weakness (generalized): Secondary | ICD-10-CM | POA: Diagnosis not present

## 2023-05-21 DIAGNOSIS — R29898 Other symptoms and signs involving the musculoskeletal system: Secondary | ICD-10-CM | POA: Diagnosis not present

## 2023-05-21 DIAGNOSIS — M25512 Pain in left shoulder: Secondary | ICD-10-CM | POA: Diagnosis not present

## 2023-05-27 ENCOUNTER — Encounter: Payer: Self-pay | Admitting: Sports Medicine

## 2023-05-27 ENCOUNTER — Non-Acute Institutional Stay: Payer: Self-pay | Admitting: Sports Medicine

## 2023-05-27 DIAGNOSIS — I639 Cerebral infarction, unspecified: Secondary | ICD-10-CM | POA: Diagnosis not present

## 2023-05-27 DIAGNOSIS — M25512 Pain in left shoulder: Secondary | ICD-10-CM | POA: Diagnosis not present

## 2023-05-27 DIAGNOSIS — M810 Age-related osteoporosis without current pathological fracture: Secondary | ICD-10-CM

## 2023-05-27 DIAGNOSIS — I2782 Chronic pulmonary embolism: Secondary | ICD-10-CM | POA: Diagnosis not present

## 2023-05-27 DIAGNOSIS — I10 Essential (primary) hypertension: Secondary | ICD-10-CM

## 2023-05-27 DIAGNOSIS — M6281 Muscle weakness (generalized): Secondary | ICD-10-CM | POA: Diagnosis not present

## 2023-05-27 DIAGNOSIS — R29898 Other symptoms and signs involving the musculoskeletal system: Secondary | ICD-10-CM | POA: Diagnosis not present

## 2023-05-27 DIAGNOSIS — M159 Polyosteoarthritis, unspecified: Secondary | ICD-10-CM | POA: Diagnosis not present

## 2023-05-27 DIAGNOSIS — F418 Other specified anxiety disorders: Secondary | ICD-10-CM

## 2023-05-27 DIAGNOSIS — J309 Allergic rhinitis, unspecified: Secondary | ICD-10-CM | POA: Diagnosis not present

## 2023-05-27 NOTE — Progress Notes (Signed)
 Provider:  Dr. Venita Sheffield Location:  [facility] Place of Service:   Assisted living  PCP: Venita Sheffield, MD Patient Care Team: Venita Sheffield, MD as PCP - General (Internal Medicine) [name], MD as PCP - Cardiology (Cardiology) [name], [name], NP as Nurse Practitioner (Internal Medicine) Marzella Schlein., MD (Ophthalmology) Arminda Resides, MD as Consulting Physician (Dermatology) Arminda Resides, MD as Consulting Physician (Dermatology) Teodora Medici, MD as Consulting Physician (Gynecology)  Extended Emergency Contact Information Primary Emergency Contact: Aline Brochure [place] Mobile Phone: [phone] Relation: Daughter Secondary Emergency Contact: Black,Joel Mobile Phone: [phone] Relation: Son  Goals of Care: Advanced Directive information    05/15/2023   11:30 AM  Advanced Directives  Does Patient Have a Medical Advance Directive? Yes  Type of Estate agent of [place];Living will;Out of facility DNR (pink MOST or yellow form)  Does patient want to make changes to medical advance directive? No - Patient declined  Copy of Healthcare Power of Attorney in Chart? Yes - validated most recent copy scanned in chart (See row information)       History of Present Illness       89 year old female with a past medical history of CVA, pulmonary embolism, hypertension, GERD, allergic rhinitis, osteoporosis is evaluated for chronic disease management. Patient seen and examined in her room. She is sitting up recliner chair.  She seems pleasant and comfortable does not appear to be in distress.  Depression-patient reports that she feels depressed and not motivated. Denies having active suicidal ideation. She is currently on Wellbutrin, Celexa.  Hypertension-blood pressure 116/74 Denies being dizzy, lightheaded Currently on lisinopril   She experiences dizziness, particularly when trying to  stand up,  uses a walker for mobility.    She has a history of two strokes, which she believes have resulted in weakness in her left leg. She is currently undergoing therapy for upper body strength due to neck and shoulder issues and has received two injections in her shoulder to manage pain from overuse.  She experiences daily pain in her right hip and back, which she attributes to scoliosis. The pain is present every morning but improves as the day progresses. She performs exercises on her bed to alleviate the discomfort.   She experiences a cough that she believes is related to reflux, though it occurs only sometimes. No sensation of postnasal drip when lying down.  She wakes up at night to use the bathroom but denies any burning sensation during urination or blood in her urine or stool. She has regular bowel movements, sometimes more than once a day.   Pt denies chest pain, palpitations, SOB, abdominal pain, nausea, vomiting, dysuria, hematuria, bloody or dark stools.   Past Medical History:  Diagnosis Date   Anginal pain (HCC) 08/24/2010   Echo-EF =>55%,LV normal   Arthritis    Cataract 12/2006   Both eyes  Epes MD   Chest pain, unspecified 09/04/2007   Lexiscan-- EF 72%; LV normal   Constipation    Takes flax seed and honey .  Smooth Move tea.   Depression    Diverticulitis    Head injury, closed    with fall   History of cardiac cath 2012   normal coronary arteries   Hypertension    Mitral valve prolapse    Osteopenia    Stroke (HCC) 09/02/2019   Past Surgical History:  Procedure Laterality Date   ANTERIOR CERVICAL DECOMP/DISCECTOMY FUSION N/A 10/23/2012   Procedure: Cervical Five  to Cervical Six, Cervical Six to Cervical Seven anterior cervical decompression with fusion plating and bonegraft;  Surgeon: Hewitt Shorts, MD;  Location: [facility];  Service: Neurosurgery;  Laterality: N/A;  ANTERIOR CERVICAL DECOMPRESSION/DISCECTOMY FUSION 2 LEVELS   BREAST CYST  ASPIRATION  1990   benign   CARDIAC CATHETERIZATION  08/01/2010   normal coronaries   COLONOSCOPY  approx 2011   EYE SURGERY Bilateral 2011   intestinal blockage surgery  Feb. 2012   "kink in small intestine" per pt    reports that she has never smoked. She has never used smokeless tobacco. She reports that she does not currently use alcohol after a past usage of about 4.0 standard drinks of alcohol per week. She reports that she does not use drugs. Social History   Socioeconomic History   Marital status: Widowed    Spouse name: Joe   Number of children: 2   Years of education: college   Highest education level: Bachelor's degree (e.g., BA, AB, BS)  Occupational History    Comment: retired Runner, broadcasting/film/video  Tobacco Use   Smoking status: Never   Smokeless tobacco: Never  Vaping Use   Vaping status: Never Used  Substance and Sexual Activity   Alcohol use: Not Currently    Alcohol/week: 4.0 standard drinks of alcohol    Types: 2 Glasses of wine, 2 Cans of beer per week    Comment: occasionally   Drug use: No   Sexual activity: Yes  Other Topics Concern   Not on file  Social History Narrative   Diet:  Normal   Do you drink/eat things with caffeine?   Yes   Marital status:  Widow                            What year were you married? 1986 and 1960   Do you live in a house, apartment, assisted living, condo, trailer, etc)?  Apartment at [facility], independent living   Is it one or more stories? 1+   How many persons live in your home?  1   Do you have any pets in your home?  No   Current or past profession:  Runner, broadcasting/film/video, Environmental health practitioner at [employer] you exercise?   Yes                                                 Type & how often: Stretching,  Strengthening   Do you have a living will?  Yes   Do you have a DNR Form?   Do you have a POA/HPOA forms?  HPOA   Social Drivers of Corporate investment banker Strain: Low Risk  (09/03/2017)   Overall Financial Resource  Strain (CARDIA)    Difficulty of Paying Living Expenses: Not hard at all  Food Insecurity: No Food Insecurity (09/03/2017)   Hunger Vital Sign    Worried About Running Out of Food in the Last Year: Never true    Ran Out of Food in the Last Year: Never true  Transportation Needs: No Transportation Needs (09/03/2017)   PRAPARE - Administrator, Civil Service (Medical): No    Lack of Transportation (Non-Medical): No  Physical Activity: Sufficiently Active (09/03/2017)   Exercise Vital Sign    Days of  Exercise per Week: 7 days    Minutes of Exercise per Session: 40 min  Stress: Stress Concern Present (09/03/2017)   [employer] of Occupational Health - Occupational Stress Questionnaire    Feeling of Stress : To some extent  Social Connections: Somewhat Isolated (09/03/2017)   Social Connection and Isolation Panel [NHANES]    Frequency of Communication with Friends and Family: More than three times a week    Frequency of Social Gatherings with Friends and Family: More than three times a week    Attends Religious Services: Never    Database administrator or Organizations: No    Attends Banker Meetings: Never    Marital Status: Married  Catering manager Violence: Not At Risk (09/03/2017)   Humiliation, Afraid, Rape, and Kick questionnaire    Fear of Current or Ex-Partner: No    Emotionally Abused: No    Physically Abused: No    Sexually Abused: No    Functional Status Survey:    Family History  Problem Relation Age of Onset   Hypertension Mother    Transient ischemic attack Mother    Parkinson's disease Mother    Dementia Mother    CVA Mother    Diabetes Father    Coronary artery disease Father    Heart failure Father    Cancer Father        prostate   Depression Father    Congestive Heart Failure Father    Prostate cancer Brother    Diabetes Brother    Prostate cancer Paternal Grandmother     Health Maintenance  Topic Date Due   COVID-19 Vaccine (6  - 2024-25 season) 02/13/2023   Medicare Annual Wellness (AWV)  03/27/2024   DTaP/Tdap/Td (3 - Td or Tdap) 04/02/2033   Pneumonia Vaccine 34+ Years old  Completed   INFLUENZA VACCINE  Completed   DEXA SCAN  Completed   Zoster Vaccines- Shingrix  Completed   HPV VACCINES  Aged Out    Allergies  Allergen Reactions   Contrast Media [Iodinated Contrast Media] Hives    Hives during IVP at urology office '02, requires 13 hr prep///a.calhoun  Patient came thru ER and had a 1 hour pre-medication and did fine   Oxycontin [Oxycodone] Other (See Comments)    Decreased appetite.   Boniva [Ibandronate] Nausea And Vomiting and Other (See Comments)    Weakness    Codeine Diarrhea and Nausea And Vomiting   Darvocet [Propoxyphene N-Acetaminophen] Diarrhea and Nausea And Vomiting   Erythromycin Nausea And Vomiting    Outpatient Encounter Medications as of 05/27/2023  Medication Sig   acetaminophen (TYLENOL) 325 MG tablet Take 650 mg by mouth at bedtime. 2 entries: 650 mg every 6 hours as needed   alendronate (FOSAMAX) 70 MG tablet TAKE 1 TAB ONCE A WEEK, AT LEAST 30 MIN BEFORE 1ST FOOD.DO NOT LIE DOWN FOR 30 MIN AFTER TAKING.   amLODipine (NORVASC) 5 MG tablet Take 5 mg by mouth in the morning.   apixaban (ELIQUIS) 5 MG TABS tablet Take 2.5 mg by mouth 2 (two) times daily.   atorvastatin (LIPITOR) 20 MG tablet TAKE ONE TABLET BY MOUTH ONCE DAILY.   buPROPion (WELLBUTRIN XL) 150 MG 24 hr tablet Take 150 mg by mouth daily.   Calcium Carbonate-Vitamin D (CALCIUM 600 +D HIGH POTENCY) 600-10 MG-MCG TABS Take 1 tablet by mouth in the morning.   citalopram (CELEXA) 20 MG tablet Take 1 tablet (20 mg total) by mouth daily.   clopidogrel (  PLAVIX) 75 MG tablet TAKE 1 TABLET ONCE DAILY.   diclofenac Sodium (VOLTAREN) 1 % GEL Apply 1 g topically 2 (two) times daily.   docusate sodium (COLACE) 100 MG capsule Take 100 mg by mouth at bedtime.   lisinopril (ZESTRIL) 10 MG tablet Take 10 mg by mouth daily.    nystatin cream (MYCOSTATIN) Apply to under breasts (rash) topically every 12 hours as needed for moisture associated rash Apply to under breasts (rash) topically two times a day for moisture associated rash for 2 Weeks   pantoprazole (PROTONIX) 40 MG tablet TAKE 1 TABLET ONCE DAILY.   triamcinolone cream (KENALOG) 0.1 % Apply to under breast (rash) topically every 12 hours as needed for moisture associated rash Apply to under breasts (rash) topically two times a day for moisture associated rash for 2 Weeks   No facility-administered encounter medications on file as of 05/27/2023.    Review of Systems  Constitutional:  Negative for chills and fever.  HENT:  Negative for sinus pressure and sore throat.   Respiratory:  Negative for cough, shortness of breath and wheezing.   Cardiovascular:  Negative for chest pain, palpitations and leg swelling.  Gastrointestinal:  Negative for abdominal distention, abdominal pain, blood in stool, constipation, diarrhea, nausea and vomiting.  Genitourinary:  Negative for dysuria, frequency and urgency.  Musculoskeletal:  Positive for arthralgias.  Neurological:  Negative for dizziness, weakness and numbness.  Psychiatric/Behavioral:  Positive for dysphoric mood. Negative for confusion and suicidal ideas.    Negative unless indicated in HPI.  There were no vitals filed for this visit. There is no height or weight on file to calculate BMI. BP Readings from Last 3 Encounters:  05/17/23 130/71  05/15/23 130/71  03/28/23 136/72   Wt Readings from Last 3 Encounters:  05/17/23 148 lb (67.1 kg)  05/15/23 148 lb 9.6 oz (67.4 kg)  03/28/23 148 lb (67.1 kg)   Physical Exam Constitutional:      Appearance: Normal appearance.  HENT:     Head: Normocephalic and atraumatic.  Cardiovascular:     Rate and Rhythm: Normal rate and regular rhythm.  Pulmonary:     Effort: Pulmonary effort is normal. No respiratory distress.     Breath sounds: Normal breath sounds.  No wheezing.  Abdominal:     General: Bowel sounds are normal. There is no distension.     Tenderness: There is no abdominal tenderness. There is no guarding or rebound.     Comments:    Musculoskeletal:        General: No swelling or tenderness.  Neurological:     Mental Status: She is alert. Mental status is at baseline.     Motor: No weakness.     Labs reviewed: Basic Metabolic Panel: Recent Labs    06/25/22 0000 08/23/22 0000 03/05/23 0000  NA 138 140 138  K 3.9 3.9 3.6  CL 106 103 103  CO2 26* 31* 25*  BUN 15 16 24*  CREATININE 0.7 0.9 1.0  CALCIUM 9.7 9.8 9.2   Liver Function Tests: Recent Labs    08/23/22 0000 03/05/23 0000  AST 19 25  ALT 11 19  ALKPHOS 62 43  ALBUMIN 4.4 3.8   No results for input(s): "LIPASE", "AMYLASE" in the last 8760 hours. No results for input(s): "AMMONIA" in the last 8760 hours. CBC: Recent Labs    06/25/22 0000 08/23/22 0000 03/05/23 0000  WBC 5.9 5.0 6.1  NEUTROABS 3,776.00 2,875.00 4,477.00  HGB 12.7 13.3 12.1  HCT 38 40 37  PLT 245 234 229   Cardiac Enzymes: No results for input(s): "CKTOTAL", "CKMB", "CKMBINDEX", "TROPONINI" in the last 8760 hours. BNP: Invalid input(s): "POCBNP" Lab Results  Component Value Date   HGBA1C 5.3 09/03/2019   Lab Results  Component Value Date   TSH 2.71 08/23/2022   No results found for: "VITAMINB12" No results found for: "FOLATE" No results found for: "IRON", "TIBC", "FERRITIN"  Imaging and Procedures obtained prior to SNF admission: VAS [place] LOWER EXTREMITY VENOUS (DVT) Result Date: 01/27/2022  Lower Venous DVT Study Patient Name:  TEOLA FELIPE Waldridge  Date of Exam:   01/27/2022 Medical Rec #: [id number]    Accession #:    [id number] Date of Birth: [birth year]    Patient Gender: F Patient Age:   36 years Exam Location:  [facility] Procedure:      VAS [place] LOWER EXTREMITY VENOUS (DVT) Referring Phys: Mertha Baars  --------------------------------------------------------------------------------  Indications: Pulmonary embolism. Left leg pain.  Risk Factors: Limited mobility. Comparison Study: No prior studies. Performing Technologist: Jean Rosenthal RDMS, RVT  Examination Guidelines: A complete evaluation includes B-mode imaging, spectral Doppler, color Doppler, and power Doppler as needed of all accessible portions of each vessel. Bilateral testing is considered an integral part of a complete examination. Limited examinations for reoccurring indications may be performed as noted. The reflux portion of the exam is performed with the patient in reverse Trendelenburg.  +---------+---------------+---------+-----------+----------+--------------+ RIGHT    CompressibilityPhasicitySpontaneityPropertiesThrombus Aging +---------+---------------+---------+-----------+----------+--------------+ CFV      Full           Yes      Yes                                 +---------+---------------+---------+-----------+----------+--------------+ SFJ      Full                                                        +---------+---------------+---------+-----------+----------+--------------+ FV Prox  Full                                                        +---------+---------------+---------+-----------+----------+--------------+ FV Mid   Full                                                        +---------+---------------+---------+-----------+----------+--------------+ FV DistalFull                                                        +---------+---------------+---------+-----------+----------+--------------+ PFV      Full                                                        +---------+---------------+---------+-----------+----------+--------------+  POP      Full           Yes      Yes                                  +---------+---------------+---------+-----------+----------+--------------+ PTV      Full                                                        +---------+---------------+---------+-----------+----------+--------------+ PERO     Full                                                        +---------+---------------+---------+-----------+----------+--------------+ Gastroc  Full                                                        +---------+---------------+---------+-----------+----------+--------------+   +---------+---------------+---------+-----------+----------+--------------+ LEFT     CompressibilityPhasicitySpontaneityPropertiesThrombus Aging +---------+---------------+---------+-----------+----------+--------------+ CFV      Full           Yes      Yes                                 +---------+---------------+---------+-----------+----------+--------------+ SFJ      Full                                                        +---------+---------------+---------+-----------+----------+--------------+ FV Prox  Full                                                        +---------+---------------+---------+-----------+----------+--------------+ FV Mid   Full                                                        +---------+---------------+---------+-----------+----------+--------------+ FV DistalFull                                                        +---------+---------------+---------+-----------+----------+--------------+ PFV      Full                                                        +---------+---------------+---------+-----------+----------+--------------+  POP      Full           Yes      Yes                                 +---------+---------------+---------+-----------+----------+--------------+ PTV      Full                                                         +---------+---------------+---------+-----------+----------+--------------+ PERO     Full                                                        +---------+---------------+---------+-----------+----------+--------------+ Gastroc  Full                                                        +---------+---------------+---------+-----------+----------+--------------+     Summary: RIGHT: - There is no evidence of deep vein thrombosis in the lower extremity.  - No cystic structure found in the popliteal fossa.  LEFT: - There is no evidence of deep vein thrombosis in the lower extremity.  - No cystic structure found in the popliteal fossa.  *See table(s) above for measurements and observations. Electronically signed by Coral Else MD on 01/27/2022 at 8:50:53 PM.    Final    ECHOCARDIOGRAM COMPLETE Result Date: 01/27/2022    ECHOCARDIOGRAM REPORT   Patient Name:   Kaniah Rizzolo Casebier Date of Exam: 01/27/2022 Medical Rec #:  [id number]   Height:       66.0 in Accession #:    [id number]  Weight:       133.0 lb Date of Birth:  January 03, 1935   BSA:          1.681 m Patient Age:    87 years    BP:           146/84 mmHg Patient Gender: F           HR:           92 bpm. Exam Location:  Inpatient Procedure: 2D Echo, Cardiac Doppler and Color Doppler STAT ECHO Indications:    Pericardial effusion  History:        Patient has prior history of Echocardiogram examinations, most                 recent 09/02/2019. CHF; Risk Factors:Hypertension and                 Dyslipidemia. Hx CVA. Pulmonary embolus.  Sonographer:    Ross Ludwig RDCS (AE) Referring Phys: Jonah Blue IMPRESSIONS  1. Left ventricular ejection fraction, by estimation, is 65 to 70%. The left ventricle has normal function. The left ventricle has no regional wall motion abnormalities. There is mild concentric left ventricular hypertrophy. Left ventricular diastolic parameters are indeterminate.  2. Right ventricular systolic function is normal. The right  ventricular size is normal. Tricuspid regurgitation signal is inadequate for  assessing PA pressure.  3. Moderate pericardial effusion. The pericardial effusion is circumferential. There is no evidence of cardiac tamponade.  4. The mitral valve is degenerative. Trivial mitral valve regurgitation.  5. The aortic valve is tricuspid. Aortic valve regurgitation is not visualized.  6. Unable to estimate CVP. Comparison(s): Prior images reviewed side by side. Moderate, circumferential pericardial effusion, slightly larger posteriorly in comparison, but anterior collection similar. No obvious tamponade physiology. FINDINGS  Left Ventricle: Left ventricular ejection fraction, by estimation, is 65 to 70%. The left ventricle has normal function. The left ventricle has no regional wall motion abnormalities. The left ventricular internal cavity size was normal in size. There is  mild concentric left ventricular hypertrophy. Left ventricular diastolic parameters are indeterminate. Right Ventricle: The right ventricular size is normal. No increase in right ventricular wall thickness. Right ventricular systolic function is normal. Tricuspid regurgitation signal is inadequate for assessing PA pressure. Left Atrium: Left atrial size was normal in size. Right Atrium: Right atrial size was normal in size. Pericardium: A moderately sized pericardial effusion is present. The pericardial effusion is circumferential. There is no evidence of cardiac tamponade. Mitral Valve: The mitral valve is degenerative in appearance. There is mild calcification of the mitral valve leaflet(s). Trivial mitral valve regurgitation. Tricuspid Valve: The tricuspid valve is grossly normal. Tricuspid valve regurgitation is trivial. Aortic Valve: The aortic valve is tricuspid. There is mild aortic valve annular calcification. Aortic valve regurgitation is not visualized. Pulmonic Valve: The pulmonic valve was not well visualized. Pulmonic valve regurgitation is  not visualized. Aorta: The aortic root is normal in size and structure. Venous: Unable to estimate CVP. The inferior vena cava was not well visualized. IAS/Shunts: No atrial level shunt detected by color flow Doppler.  LEFT VENTRICLE PLAX 2D LVIDd:         3.50 cm LVIDs:         2.20 cm LV PW:         1.20 cm LV IVS:        1.30 cm LVOT diam:     1.70 cm LVOT Area:     2.27 cm  RIGHT VENTRICLE RV Basal diam:  3.50 cm RV S prime:     21.40 cm/s TAPSE (M-mode): 1.8 cm LEFT ATRIUM             Index        RIGHT ATRIUM          Index LA diam:        3.60 cm 2.14 cm/m   RA Area:     9.95 cm LA Vol (A2C):   22.2 ml 13.20 ml/m  RA Volume:   21.50 ml 12.79 ml/m LA Vol (A4C):   41.7 ml 24.80 ml/m LA Biplane Vol: 32.3 ml 19.21 ml/m   AORTA Ao Root diam: 2.50 cm Ao Asc diam:  3.30 cm  SHUNTS Systemic Diam: 1.70 cm Nona Dell MD Electronically signed by Nona Dell MD Signature Date/Time: 01/27/2022/1:29:35 PM    Final    CT Angio Chest PE W and/or Wo Contrast Result Date: 01/27/2022 CLINICAL DATA:  Chest pain EXAM: CT ANGIOGRAPHY CHEST WITH CONTRAST TECHNIQUE: Multidetector CT imaging of the chest was performed using the standard protocol during bolus administration of intravenous contrast. Multiplanar CT image reconstructions and MIPs were obtained to evaluate the vascular anatomy. RADIATION DOSE REDUCTION: This exam was performed according to the departmental dose-optimization program which includes automated exposure control, adjustment of the mA and/or kV according to patient size  and/or use of iterative reconstruction technique. CONTRAST:  125 mL OMNIPAQUE IOHEXOL 350 MG/ML SOLN COMPARISON:  08/02/2010 FINDINGS: Cardiovascular: Cardiomegaly. There is pericardial effusion with thickness of up to 1 cm. No aortic aneurysm or dissection. Left lower lobe and lingular pulmonary artery filling defects consistent with PE. Small branch PE right lower lobe. Mediastinum/Nodes: No suspicious adenopathy identified.  Lungs/Pleura: Moderate-sized left-sided pleural effusion some of which is loculated medially. Dependent bibasilar subsegmental atelectasis and ground-glass densities consistent with pneumonitis or edema. Upper Abdomen: Bilateral renal cysts identified, partially imaged. Right lobe hepatic cyst, 1.9 cm. Musculoskeletal: No chest wall abnormality. No acute or significant osseous findings. Review of the MIP images confirms the above findings. IMPRESSION: 1. Bilateral pulmonary emboli. 2. No aortic aneurysm or dissection. 3. Moderate left-sided pleural effusion. 4. Dependent bibasilar subsegmental atelectasis or pneumonitis. 5. Pericardial effusion. 6. Bilateral renal and hepatic cysts. Findings were discussed with and acknowledged by PA Lauren Autry 10:40 a.m. 01/27/2022. Electronically Signed   By: Layla Maw M.D.   On: 01/27/2022 10:42   DG Chest 2 View Result Date: 01/26/2022 CLINICAL DATA:  chest pain, left shoulder pain EXAM: CHEST - 2 VIEW COMPARISON:  Chest x-ray 10/21/2012, CT chest 08/02/2010 FINDINGS: The heart and mediastinal contours are within normal limits. Bibasilar streaky airspace opacities likely representing atelectasis. Question developing right middle lobe airspace opacity. No pulmonary edema. No pleural effusion. No pneumothorax. No acute osseous abnormality. Please see separately dictated left shoulder radiograph 01/26/2022. IMPRESSION: 1.  Question developing right middle lobe airspace opacity. 2. Bibasilar streaky airspace opacities likely representing atelectasis. Electronically Signed   By: Tish Frederickson M.D.   On: 01/26/2022 23:20   DG Shoulder Left Result Date: 01/26/2022 CLINICAL DATA:  Left shoulder and breast pain, initial encounter EXAM: LEFT SHOULDER - 2+ VIEW COMPARISON:  08/02/2012 FINDINGS: Degenerative changes of the acromioclavicular joint are seen. No acute fracture or dislocation is seen. The underlying bony thorax is within normal limits. IMPRESSION:  Degenerative change without acute abnormality. Electronically Signed   By: Alcide Clever M.D.   On: 01/26/2022 23:13    Assessment and Plan        Depression Patient reports feeling sad Denies active suicidal ideations Will increase Wellbutrin to 300 mg Continue with Celexa  History of recurrent strokes Strength intact lower extremities Ambulates with a walker Denies double vision, dysphagia, blurry vision Continue with Plavix, lipitor   History of a pulmonary embolism On Eliquis No signs of bleeding  Osteoarthritis multiple sites Continue with Tylenol as needed Continue with PT/OT  Hypertension Blood pressure at goal Continue with amlodipine, lisinopril  History of allergic rhinitis Continue loratadine, Flonase  History of osteoporosis  continue with Fosamax, calcium, vitamin D.       Family/ staff Communication:   Labs/tests ordered:  Venita Sheffield

## 2023-05-30 DIAGNOSIS — M6281 Muscle weakness (generalized): Secondary | ICD-10-CM | POA: Diagnosis not present

## 2023-05-30 DIAGNOSIS — M25512 Pain in left shoulder: Secondary | ICD-10-CM | POA: Diagnosis not present

## 2023-05-30 DIAGNOSIS — R29898 Other symptoms and signs involving the musculoskeletal system: Secondary | ICD-10-CM | POA: Diagnosis not present

## 2023-06-04 ENCOUNTER — Emergency Department (HOSPITAL_COMMUNITY)
Admission: EM | Admit: 2023-06-04 | Discharge: 2023-06-04 | Disposition: A | Discharge status: Skilled Nursing Facility | Attending: Emergency Medicine | Admitting: Emergency Medicine

## 2023-06-04 ENCOUNTER — Other Ambulatory Visit: Payer: Self-pay

## 2023-06-04 ENCOUNTER — Encounter (HOSPITAL_COMMUNITY): Payer: Self-pay | Admitting: Emergency Medicine

## 2023-06-04 ENCOUNTER — Emergency Department (HOSPITAL_COMMUNITY)

## 2023-06-04 DIAGNOSIS — Z7901 Long term (current) use of anticoagulants: Secondary | ICD-10-CM | POA: Diagnosis not present

## 2023-06-04 DIAGNOSIS — W19XXXA Unspecified fall, initial encounter: Secondary | ICD-10-CM

## 2023-06-04 DIAGNOSIS — R531 Weakness: Secondary | ICD-10-CM | POA: Diagnosis not present

## 2023-06-04 DIAGNOSIS — M791 Myalgia, unspecified site: Secondary | ICD-10-CM | POA: Diagnosis not present

## 2023-06-04 DIAGNOSIS — S0990XA Unspecified injury of head, initial encounter: Secondary | ICD-10-CM | POA: Diagnosis not present

## 2023-06-04 DIAGNOSIS — I6529 Occlusion and stenosis of unspecified carotid artery: Secondary | ICD-10-CM | POA: Diagnosis not present

## 2023-06-04 DIAGNOSIS — R519 Headache, unspecified: Secondary | ICD-10-CM | POA: Diagnosis not present

## 2023-06-04 DIAGNOSIS — Z7401 Bed confinement status: Secondary | ICD-10-CM | POA: Diagnosis not present

## 2023-06-04 DIAGNOSIS — Z981 Arthrodesis status: Secondary | ICD-10-CM | POA: Diagnosis not present

## 2023-06-04 DIAGNOSIS — Z743 Need for continuous supervision: Secondary | ICD-10-CM | POA: Diagnosis not present

## 2023-06-04 DIAGNOSIS — W01198A Fall on same level from slipping, tripping and stumbling with subsequent striking against other object, initial encounter: Secondary | ICD-10-CM | POA: Diagnosis not present

## 2023-06-04 DIAGNOSIS — M542 Cervicalgia: Secondary | ICD-10-CM | POA: Diagnosis not present

## 2023-06-04 DIAGNOSIS — Y9301 Activity, walking, marching and hiking: Secondary | ICD-10-CM | POA: Insufficient documentation

## 2023-06-04 DIAGNOSIS — E782 Mixed hyperlipidemia: Secondary | ICD-10-CM | POA: Diagnosis not present

## 2023-06-04 NOTE — ED Notes (Signed)
 LEVEL 2, FOT FALL RISK BRACELET APPLIED AT EMS REGISTRATION

## 2023-06-04 NOTE — ED Notes (Signed)
[facility] called for transport to [facility]

## 2023-06-04 NOTE — Discharge Instructions (Signed)
 The pictures of your head and neck look good.  No signs of bleeding on the inside of the skull or broken bones in the neck.  Please follow-up with your family doctor in the office.  She is because the pictures here look good does not mean that you cannot develop bleeding inside the head.  Please return if you develop sudden worsening headache confusion or vomiting.

## 2023-06-04 NOTE — ED Triage Notes (Addendum)
 Pt BIB EMS from [facility], states that she fell x 1 hour ago c/o left sided head pain. Staff found her on the floor and helped her back into bed. No LOC. Pt is on plavix and eliquis. Pt is A&O x 4.   125/75 72HR 95RA  [id number]

## 2023-06-04 NOTE — ED Provider Notes (Signed)
 [place] EMERGENCY DEPARTMENT AT [facility] Provider Note   CSN: [id number] Arrival date & time: 06/04/23  2109     History  Chief Complaint  Patient presents with   FOT    Adriana [name] is a 89 y.o. female.  89 yo F with a chief complaint of a fall.  Patient said that she lost her balance while walking and she struck the left side of her head.  Complaining only of pain there.  She denies any other pain from the fall.  Reportedly nursing staff found her on the ground and picked her up and put her back into a chair.  No obvious signs of trauma.        Home Medications Prior to Admission medications   Medication Sig Start Date End Date Taking? Authorizing Provider  acetaminophen (TYLENOL) 325 MG tablet Take 650 mg by mouth at bedtime. 2 entries: 650 mg every 6 hours as needed    [provider]  alendronate (FOSAMAX) 70 MG tablet TAKE 1 TAB ONCE A WEEK, AT LEAST 30 MIN BEFORE 1ST FOOD.DO NOT LIE DOWN FOR 30 MIN AFTER TAKING. 02/07/21   [name], [name], NP  amLODipine (NORVASC) 5 MG tablet Take 5 mg by mouth in the morning.    [provider]  apixaban (ELIQUIS) 5 MG TABS tablet Take 2.5 mg by mouth 2 (two) times daily.    [provider]  atorvastatin (LIPITOR) 20 MG tablet TAKE ONE TABLET BY MOUTH ONCE DAILY. 06/15/21   [name], [name], NP  buPROPion (WELLBUTRIN XL) 150 MG 24 hr tablet Take 150 mg by mouth daily.    [provider]  Calcium Carbonate-Vitamin D (CALCIUM 600 +D HIGH POTENCY) 600-10 MG-MCG TABS Take 1 tablet by mouth in the morning.    [provider]  citalopram (CELEXA) 20 MG tablet Take 1 tablet (20 mg total) by mouth daily. 10/23/21   [name], [name], NP  clopidogrel (PLAVIX) 75 MG tablet TAKE 1 TABLET ONCE DAILY. 05/16/21   [name], [name], NP  diclofenac Sodium (VOLTAREN) 1 % GEL Apply 1 g topically 2 (two) times daily.    [provider]  docusate sodium (COLACE) 100 MG capsule Take 100 mg by mouth at bedtime.     [provider]  fluticasone (FLONASE) 50 MCG/ACT nasal spray Place into both nostrils. 05/17/23   [provider]  lisinopril (ZESTRIL) 10 MG tablet Take 10 mg by mouth daily.    [provider]  nystatin cream (MYCOSTATIN) Apply to under breasts (rash) topically every 12 hours as needed for moisture associated rash Apply to under breasts (rash) topically two times a day for moisture associated rash for 2 Weeks    [provider]  pantoprazole (PROTONIX) 40 MG tablet TAKE 1 TABLET ONCE DAILY. 05/16/21   [name], [name], NP  triamcinolone cream (KENALOG) 0.1 % Apply to under breast (rash) topically every 12 hours as needed for moisture associated rash Apply to under breasts (rash) topically two times a day for moisture associated rash for 2 Weeks    [provider]      Allergies    Contrast media [iodinated contrast media], Oxycontin [oxycodone], Boniva [ibandronate], Codeine, Darvocet [propoxyphene n-acetaminophen], and Erythromycin    Review of Systems   Review of Systems  Physical Exam Updated Vital Signs BP 130/77   Pulse 69   Temp 98.3 F (36.8 C)   Resp 13   Ht 5\' 6"  (1.676 m)  Wt 67.4 kg   SpO2 99%   BMI 23.98 kg/m  Physical Exam Vitals and nursing note reviewed.  Constitutional:      General: She is not in acute distress.    Appearance: She is well-developed. She is not diaphoretic.  HENT:     Head: Normocephalic and atraumatic.  Eyes:     Pupils: Pupils are equal, round, and reactive to light.  Cardiovascular:     Rate and Rhythm: Normal rate and regular rhythm.     Heart sounds: No murmur heard.    No friction rub. No gallop.  Pulmonary:     Effort: Pulmonary effort is normal.     Breath sounds: No wheezing or rales.  Abdominal:     General: There is no distension.     Palpations: Abdomen is soft.     Tenderness: There is no abdominal tenderness.  Musculoskeletal:        General: No tenderness.     Cervical back:  Normal range of motion and neck supple.     Comments: Patient was palpated from head to toe without obvious noted area of bony tenderness.  Skin:    General: Skin is warm and dry.  Neurological:     Mental Status: She is alert and oriented to person, place, and time.  Psychiatric:        Behavior: Behavior normal.     ED Results / Procedures / Treatments   Labs (all labs ordered are listed, but only abnormal results are displayed) Labs Reviewed - No data to display  EKG None  Radiology CT Head Wo Contrast Result Date: 06/04/2023 CLINICAL DATA:  Fall, on blood thinners, left-sided head pain. Found on floor. Neck trauma. EXAM: CT HEAD WITHOUT CONTRAST CT CERVICAL SPINE WITHOUT CONTRAST TECHNIQUE: Multidetector CT imaging of the head and cervical spine was performed following the standard protocol without intravenous contrast. Multiplanar CT image reconstructions of the cervical spine were also generated. RADIATION DOSE REDUCTION: This exam was performed according to the departmental dose-optimization program which includes automated exposure control, adjustment of the mA and/or kV according to patient size and/or use of iterative reconstruction technique. COMPARISON:  Cervical spine radiographs 10/23/2012; CT cervical spine 10/07/2012; MRI head 09/02/2019 and CT head 09/02/2019 FINDINGS: CT HEAD FINDINGS Brain: No intracranial hemorrhage, mass effect, or evidence of acute infarct. No hydrocephalus. No extra-axial fluid collection. Age related cerebral atrophy and chronic small vessel ischemic disease. Chronic left occipital infarct. Vascular: No hyperdense vessel. Intracranial arterial calcification. Skull: No fracture or focal lesion. Sinuses/Orbits: No acute finding. Other: None. CT CERVICAL SPINE FINDINGS Alignment: No evidence of traumatic malalignment. Skull base and vertebrae: No acute fracture. No primary bone lesion or focal pathologic process. Soft tissues and spinal canal: No prevertebral  fluid or swelling. No visible canal hematoma. Disc levels: ACDF C5-C7 with interbody spacers. Mild multilevel spondylosis and facet arthropathy. No severe spinal canal narrowing. Upper chest: No acute abnormality. Other: Carotid calcification. IMPRESSION: 1. No acute intracranial abnormality. 2. No acute fracture in the cervical spine. Electronically Signed   By: Minerva Fester M.D.   On: 06/04/2023 21:40   CT Cervical Spine Wo Contrast Result Date: 06/04/2023 CLINICAL DATA:  Fall, on blood thinners, left-sided head pain. Found on floor. Neck trauma. EXAM: CT HEAD WITHOUT CONTRAST CT CERVICAL SPINE WITHOUT CONTRAST TECHNIQUE: Multidetector CT imaging of the head and cervical spine was performed following the standard protocol without intravenous contrast. Multiplanar CT image reconstructions of the cervical spine were also generated. RADIATION  DOSE REDUCTION: This exam was performed according to the departmental dose-optimization program which includes automated exposure control, adjustment of the mA and/or kV according to patient size and/or use of iterative reconstruction technique. COMPARISON:  Cervical spine radiographs 10/23/2012; CT cervical spine 10/07/2012; MRI head 09/02/2019 and CT head 09/02/2019 FINDINGS: CT HEAD FINDINGS Brain: No intracranial hemorrhage, mass effect, or evidence of acute infarct. No hydrocephalus. No extra-axial fluid collection. Age related cerebral atrophy and chronic small vessel ischemic disease. Chronic left occipital infarct. Vascular: No hyperdense vessel. Intracranial arterial calcification. Skull: No fracture or focal lesion. Sinuses/Orbits: No acute finding. Other: None. CT CERVICAL SPINE FINDINGS Alignment: No evidence of traumatic malalignment. Skull base and vertebrae: No acute fracture. No primary bone lesion or focal pathologic process. Soft tissues and spinal canal: No prevertebral fluid or swelling. No visible canal hematoma. Disc levels: ACDF C5-C7 with interbody  spacers. Mild multilevel spondylosis and facet arthropathy. No severe spinal canal narrowing. Upper chest: No acute abnormality. Other: Carotid calcification. IMPRESSION: 1. No acute intracranial abnormality. 2. No acute fracture in the cervical spine. Electronically Signed   By: Minerva Fester M.D.   On: 06/04/2023 21:40    Procedures Procedures    Medications Ordered in ED Medications - No data to display  ED Course/ Medical Decision Making/ A&P                                 Medical Decision Making Amount and/or Complexity of Data Reviewed Radiology: ordered.   89 yo F with a chief complaints of fall.  Patient says that it was nonsyncopal.  Complaining only of left-sided head pain.  CT imaging here is negative.  Will discharge home.  PCP follow-up.  10:00 PM:  I have discussed the diagnosis/risks/treatment options with the patient.  Evaluation and diagnostic testing in the emergency department does not suggest an emergent condition requiring admission or immediate intervention beyond what has been performed at this time.  They will follow up with PCP. We also discussed returning to the ED immediately if new or worsening sx occur. We discussed the sx which are most concerning (e.g., sudden worsening pain, fever, inability to tolerate by mouth) that necessitate immediate return. Medications administered to the patient during their visit and any new prescriptions provided to the patient are listed below.  Medications given during this visit Medications - No data to display   The patient appears reasonably screen and/or stabilized for discharge and I doubt any other medical condition or other [facility] requiring further screening, evaluation, or treatment in the ED at this time prior to discharge.          Final Clinical Impression(s) / ED Diagnoses Final diagnoses:  Fall, initial encounter    Rx / DC Orders ED Discharge Orders     None         Melene Plan, DO 06/04/23  2200

## 2023-06-04 NOTE — Progress Notes (Signed)
 Orthopedic Tech Progress Note Patient Details:  Adriana [name] 03-Aug-1934 [id number]  Patient ID: Adriana [name], female   DOB: 1934-04-16, 89 y.o.   MRN: [id number] Level II; not currently needed. Adriana [name] 06/04/2023, 9:17 PM

## 2023-06-04 NOTE — ED Notes (Signed)
 Trauma Response Nurse Documentation   Adriana [name] is a 89 y.o. female arriving to [facility] ED via EMS  On plavix and eliquis. Trauma was activated as a Level 2 by ED charge RN based on the following trauma criteria Elderly patients > 65 with head trauma on anti-coagulation (excluding ASA).  Patient cleared for CT by Dr. Adela Lank EDP. Pt transported to CT with trauma response nurse present to monitor. RN remained with the patient throughout their absence from the department for clinical observation.   GCS 15.   History   Past Medical History:  Diagnosis Date   Anginal pain (HCC) 08/24/2010   Echo-EF =>55%,LV normal   Arthritis    Cataract 12/2006   Both eyes  Epes MD   Chest pain, unspecified 09/04/2007   Lexiscan-- EF 72%; LV normal   Constipation    Takes flax seed and honey .  Smooth Move tea.   Depression    Diverticulitis    Head injury, closed    with fall   History of cardiac cath 2012   normal coronary arteries   Hypertension    Mitral valve prolapse    Osteopenia    Stroke (HCC) 09/02/2019     Past Surgical History:  Procedure Laterality Date   ANTERIOR CERVICAL DECOMP/DISCECTOMY FUSION N/A 10/23/2012   Procedure: Cervical Five to Cervical Six, Cervical Six to Cervical Seven anterior cervical decompression with fusion plating and bonegraft;  Surgeon: Hewitt Shorts, MD;  Location: [facility];  Service: Neurosurgery;  Laterality: N/A;  ANTERIOR CERVICAL DECOMPRESSION/DISCECTOMY FUSION 2 LEVELS   BREAST CYST ASPIRATION  1990   benign   CARDIAC CATHETERIZATION  08/01/2010   normal coronaries   COLONOSCOPY  approx 2011   EYE SURGERY Bilateral 2011   intestinal blockage surgery  Feb. 2012   "kink in small intestine" per pt       Initial Focused Assessment (If applicable, or please see trauma documentation): Alert/oriented female presents via EMS from SNF after a fall hitting the left side of her head. No LOC, no obvious deformities noted.   Airway patent, BS  clear No obvious uncontrolled hemorrhage GCS 15 PERRLA  CT's Completed:   CT Head and CT C-Spine   Interventions:  EDP deferred labs and portable XRAYS CT head and c-spine  Plan for disposition:  Discharge home   Consults completed:  none  Event Summary: Presents via EMS from SNF after a ground level fall while standing. Reports left sided head pain, no obvious deformities, wounds. Trauma scans unremarkable, D/C back to facility.   MTP Summary (If applicable):NA   Bedside handoff with ED RN Luther Parody.    Adriana [name]  Trauma Response RN  Please call TRN at [phone] for further assistance.

## 2023-06-06 DIAGNOSIS — M25512 Pain in left shoulder: Secondary | ICD-10-CM | POA: Diagnosis not present

## 2023-06-06 DIAGNOSIS — R29898 Other symptoms and signs involving the musculoskeletal system: Secondary | ICD-10-CM | POA: Diagnosis not present

## 2023-06-06 DIAGNOSIS — M6281 Muscle weakness (generalized): Secondary | ICD-10-CM | POA: Diagnosis not present

## 2023-06-07 ENCOUNTER — Non-Acute Institutional Stay: Payer: Self-pay | Admitting: Sports Medicine

## 2023-06-07 DIAGNOSIS — M1712 Unilateral primary osteoarthritis, left knee: Secondary | ICD-10-CM

## 2023-06-07 DIAGNOSIS — I10 Essential (primary) hypertension: Secondary | ICD-10-CM

## 2023-06-07 NOTE — Progress Notes (Signed)
 Provider:  Dr. Venita Sheffield Location:  [facility] Place of Service:   Assisted living   PCP: Venita Sheffield, MD Patient Care Team: Venita Sheffield, MD as PCP - General (Internal Medicine) [name], MD as PCP - Cardiology (Cardiology) [name], [name], NP as Nurse Practitioner (Internal Medicine) Marzella Schlein., MD (Ophthalmology) Arminda Resides, MD as Consulting Physician (Dermatology) Arminda Resides, MD as Consulting Physician (Dermatology) Teodora Medici, MD as Consulting Physician (Gynecology)  Extended Emergency Contact Information Primary Emergency Contact: Aline Brochure [name] Mobile Phone: [phone] Relation: Daughter Secondary Emergency Contact: Black,Joel Mobile Phone: [phone] Relation: Son  Goals of Care: Advanced Directive information    06/04/2023    9:14 PM  Advanced Directives  Does Patient Have a Medical Advance Directive? Yes  Type of Estate agent of [place];Living will      No chief complaint on file.     History of Present Illness  89 year old female with a past medical history of CVA, pulmonary embolism, hypertension, GERD, allergic rhinitis, osteoporosis is evaluated for  acute visit for  Knee discomfort Pt seen and examined in her room  She is sitting in her recliner chair Seems pleasant and comfortable and does no appear to be in distress Pt c/o that her knee gives out at times  Staff reported that pt fell couple of times recently  Pt denies pain in her knees No redness, swelling reported  HTN  126/71 On lisinopril Pt denies chest pain, palpitations, SOB, abdominal pain, nausea, vomiting, dysuria, bloody or dark stools.     Past Medical History:  Diagnosis Date   Anginal pain (HCC) 08/24/2010   Echo-EF =>55%,LV normal   Arthritis    Cataract 12/2006   Both eyes  Epes MD   Chest pain, unspecified 09/04/2007   Lexiscan-- EF 72%; LV normal    Constipation    Takes flax seed and honey .  Smooth Move tea.   Depression    Diverticulitis    Head injury, closed    with fall   History of cardiac cath 2012   normal coronary arteries   Hypertension    Mitral valve prolapse    Osteopenia    Stroke (HCC) 09/02/2019   Past Surgical History:  Procedure Laterality Date   ANTERIOR CERVICAL DECOMP/DISCECTOMY FUSION N/A 10/23/2012   Procedure: Cervical Five to Cervical Six, Cervical Six to Cervical Seven anterior cervical decompression with fusion plating and bonegraft;  Surgeon: Hewitt Shorts, MD;  Location: [facility];  Service: Neurosurgery;  Laterality: N/A;  ANTERIOR CERVICAL DECOMPRESSION/DISCECTOMY FUSION 2 LEVELS   BREAST CYST ASPIRATION  1990   benign   CARDIAC CATHETERIZATION  08/01/2010   normal coronaries   COLONOSCOPY  approx 2011   EYE SURGERY Bilateral 2011   intestinal blockage surgery  Feb. 2012   "kink in small intestine" per pt    reports that she has never smoked. She has never used smokeless tobacco. She reports that she does not currently use alcohol after a past usage of about 4.0 standard drinks of alcohol per week. She reports that she does not use drugs. Social History   Socioeconomic History   Marital status: Widowed    Spouse name: Joe   Number of children: 2   Years of education: college   Highest education level: Bachelor's degree (e.g., BA, AB, BS)  Occupational History    Comment: retired Runner, broadcasting/film/video  Tobacco Use   Smoking status: Never   Smokeless tobacco: Never  Vaping Use   Vaping status: Never Used  Substance and Sexual Activity   Alcohol use: Not Currently    Alcohol/week: 4.0 standard drinks of alcohol    Types: 2 Glasses of wine, 2 Cans of beer per week    Comment: occasionally   Drug use: No   Sexual activity: Yes  Other Topics Concern   Not on file  Social History Narrative   Diet:  Normal   Do you drink/eat things with caffeine?   Yes   Marital status:  Widow                             What year were you married? 1986 and 1960   Do you live in a house, apartment, assisted living, condo, trailer, etc)?  Apartment at [facility], independent living   Is it one or more stories? 1+   How many persons live in your home?  1   Do you have any pets in your home?  No   Current or past profession:  Runner, broadcasting/film/video, Environmental health practitioner at [employer] you exercise?   Yes                                                 Type & how often: Stretching,  Strengthening   Do you have a living will?  Yes   Do you have a DNR Form?   Do you have a POA/HPOA forms?  HPOA   Social Drivers of Corporate investment banker Strain: Low Risk  (09/03/2017)   Overall Financial Resource Strain (CARDIA)    Difficulty of Paying Living Expenses: Not hard at all  Food Insecurity: No Food Insecurity (09/03/2017)   Hunger Vital Sign    Worried About Running Out of Food in the Last Year: Never true    Ran Out of Food in the Last Year: Never true  Transportation Needs: No Transportation Needs (09/03/2017)   PRAPARE - Administrator, Civil Service (Medical): No    Lack of Transportation (Non-Medical): No  Physical Activity: Sufficiently Active (09/03/2017)   Exercise Vital Sign    Days of Exercise per Week: 7 days    Minutes of Exercise per Session: 40 min  Stress: Stress Concern Present (09/03/2017)   [employer] of Occupational Health - Occupational Stress Questionnaire    Feeling of Stress : To some extent  Social Connections: Somewhat Isolated (09/03/2017)   Social Connection and Isolation Panel [NHANES]    Frequency of Communication with Friends and Family: More than three times a week    Frequency of Social Gatherings with Friends and Family: More than three times a week    Attends Religious Services: Never    Database administrator or Organizations: No    Attends Banker Meetings: Never    Marital Status: Married  Catering manager Violence: Not At Risk  (09/03/2017)   Humiliation, Afraid, Rape, and Kick questionnaire    Fear of Current or Ex-Partner: No    Emotionally Abused: No    Physically Abused: No    Sexually Abused: No    Functional Status Survey:    Family History  Problem Relation Age of Onset   Hypertension Mother    Transient ischemic attack Mother    Parkinson's  disease Mother    Dementia Mother    CVA Mother    Diabetes Father    Coronary artery disease Father    Heart failure Father    Cancer Father        prostate   Depression Father    Congestive Heart Failure Father    Prostate cancer Brother    Diabetes Brother    Prostate cancer Paternal Grandmother     Health Maintenance  Topic Date Due   COVID-19 Vaccine (6 - 2024-25 season) 02/13/2023   INFLUENZA VACCINE  10/04/2023   Medicare Annual Wellness (AWV)  03/27/2024   DTaP/Tdap/Td (3 - Td or Tdap) 04/02/2033   Pneumonia Vaccine 74+ Years old  Completed   DEXA SCAN  Completed   Zoster Vaccines- Shingrix  Completed   HPV VACCINES  Aged Out    Allergies  Allergen Reactions   Contrast Media [Iodinated Contrast Media] Hives    Hives during IVP at urology office '02, requires 13 hr prep///a.calhoun  Patient came thru ER and had a 1 hour pre-medication and did fine   Oxycontin [Oxycodone] Other (See Comments)    Decreased appetite.   Boniva [Ibandronate] Nausea And Vomiting and Other (See Comments)    Weakness    Codeine Diarrhea and Nausea And Vomiting   Darvocet [Propoxyphene N-Acetaminophen] Diarrhea and Nausea And Vomiting   Erythromycin Nausea And Vomiting    Outpatient Encounter Medications as of 06/07/2023  Medication Sig   acetaminophen (TYLENOL) 325 MG tablet Take 650 mg by mouth at bedtime. 2 entries: 650 mg every 6 hours as needed   alendronate (FOSAMAX) 70 MG tablet TAKE 1 TAB ONCE A WEEK, AT LEAST 30 MIN BEFORE 1ST FOOD.DO NOT LIE DOWN FOR 30 MIN AFTER TAKING.   amLODipine (NORVASC) 5 MG tablet Take 5 mg by mouth in the morning.    apixaban (ELIQUIS) 5 MG TABS tablet Take 2.5 mg by mouth 2 (two) times daily.   atorvastatin (LIPITOR) 20 MG tablet TAKE ONE TABLET BY MOUTH ONCE DAILY.   buPROPion (WELLBUTRIN XL) 150 MG 24 hr tablet Take 150 mg by mouth daily.   Calcium Carbonate-Vitamin D (CALCIUM 600 +D HIGH POTENCY) 600-10 MG-MCG TABS Take 1 tablet by mouth in the morning.   citalopram (CELEXA) 20 MG tablet Take 1 tablet (20 mg total) by mouth daily.   clopidogrel (PLAVIX) 75 MG tablet TAKE 1 TABLET ONCE DAILY.   diclofenac Sodium (VOLTAREN) 1 % GEL Apply 1 g topically 2 (two) times daily.   docusate sodium (COLACE) 100 MG capsule Take 100 mg by mouth at bedtime.   fluticasone (FLONASE) 50 MCG/ACT nasal spray Place into both nostrils.   lisinopril (ZESTRIL) 10 MG tablet Take 10 mg by mouth daily.   nystatin cream (MYCOSTATIN) Apply to under breasts (rash) topically every 12 hours as needed for moisture associated rash Apply to under breasts (rash) topically two times a day for moisture associated rash for 2 Weeks   pantoprazole (PROTONIX) 40 MG tablet TAKE 1 TABLET ONCE DAILY.   triamcinolone cream (KENALOG) 0.1 % Apply to under breast (rash) topically every 12 hours as needed for moisture associated rash Apply to under breasts (rash) topically two times a day for moisture associated rash for 2 Weeks   No facility-administered encounter medications on file as of 06/07/2023.    Review of Systems  Constitutional:  Negative for chills and fever.  HENT:  Negative for sinus pressure and sore throat.   Respiratory:  Negative for cough, shortness  of breath and wheezing.   Cardiovascular:  Negative for chest pain, palpitations and leg swelling.  Gastrointestinal:  Negative for abdominal distention, abdominal pain, blood in stool, constipation, diarrhea, nausea and vomiting.  Genitourinary:  Negative for dysuria, frequency and urgency.  Neurological:  Negative for dizziness.   Negative unless indicated in HPI.  There were no  vitals filed for this visit. There is no height or weight on file to calculate BMI. BP Readings from Last 3 Encounters:  06/04/23 110/69  05/27/23 116/74  05/17/23 130/71   Wt Readings from Last 3 Encounters:  06/04/23 148 lb 9.4 oz (67.4 kg)  05/27/23 148 lb 9.6 oz (67.4 kg)  05/17/23 148 lb (67.1 kg)   Physical Exam Constitutional:      Appearance: Normal appearance.  HENT:     Head: Normocephalic and atraumatic.  Cardiovascular:     Rate and Rhythm: Normal rate and regular rhythm.  Pulmonary:     Effort: Pulmonary effort is normal. No respiratory distress.     Breath sounds: Normal breath sounds. No wheezing.  Abdominal:     General: Bowel sounds are normal. There is no distension.     Tenderness: There is no abdominal tenderness. There is no guarding or rebound.     Comments:    Musculoskeletal:        General: No swelling.     Comments: Left knee- no redness, no swelling, mild joint line tenderness   Neurological:     Mental Status: She is alert. Mental status is at baseline.     Motor: No weakness.     Labs reviewed: Basic Metabolic Panel: Recent Labs    06/25/22 0000 08/23/22 0000 03/05/23 0000  NA 138 140 138  K 3.9 3.9 3.6  CL 106 103 103  CO2 26* 31* 25*  BUN 15 16 24*  CREATININE 0.7 0.9 1.0  CALCIUM 9.7 9.8 9.2   Liver Function Tests: Recent Labs    08/23/22 0000 03/05/23 0000  AST 19 25  ALT 11 19  ALKPHOS 62 43  ALBUMIN 4.4 3.8   No results for input(s): "LIPASE", "AMYLASE" in the last 8760 hours. No results for input(s): "AMMONIA" in the last 8760 hours. CBC: Recent Labs    06/25/22 0000 08/23/22 0000 03/05/23 0000  WBC 5.9 5.0 6.1  NEUTROABS 3,776.00 2,875.00 4,477.00  HGB 12.7 13.3 12.1  HCT 38 40 37  PLT 245 234 229   Cardiac Enzymes: No results for input(s): "CKTOTAL", "CKMB", "CKMBINDEX", "TROPONINI" in the last 8760 hours. BNP: Invalid input(s): "POCBNP" Lab Results  Component Value Date   HGBA1C 5.3 09/03/2019    Lab Results  Component Value Date   TSH 2.71 08/23/2022   No results found for: "VITAMINB12" No results found for: "FOLATE" No results found for: "IRON", "TIBC", "FERRITIN"  Imaging and Procedures obtained prior to SNF admission: CT Head Wo Contrast Result Date: 06/04/2023 CLINICAL DATA:  Fall, on blood thinners, left-sided head pain. Found on floor. Neck trauma. EXAM: CT HEAD WITHOUT CONTRAST CT CERVICAL SPINE WITHOUT CONTRAST TECHNIQUE: Multidetector CT imaging of the head and cervical spine was performed following the standard protocol without intravenous contrast. Multiplanar CT image reconstructions of the cervical spine were also generated. RADIATION DOSE REDUCTION: This exam was performed according to the departmental dose-optimization program which includes automated exposure control, adjustment of the mA and/or kV according to patient size and/or use of iterative reconstruction technique. COMPARISON:  Cervical spine radiographs 10/23/2012; CT cervical spine 10/07/2012; MRI head 09/02/2019  and CT head 09/02/2019 FINDINGS: CT HEAD FINDINGS Brain: No intracranial hemorrhage, mass effect, or evidence of acute infarct. No hydrocephalus. No extra-axial fluid collection. Age related cerebral atrophy and chronic small vessel ischemic disease. Chronic left occipital infarct. Vascular: No hyperdense vessel. Intracranial arterial calcification. Skull: No fracture or focal lesion. Sinuses/Orbits: No acute finding. Other: None. CT CERVICAL SPINE FINDINGS Alignment: No evidence of traumatic malalignment. Skull base and vertebrae: No acute fracture. No primary bone lesion or focal pathologic process. Soft tissues and spinal canal: No prevertebral fluid or swelling. No visible canal hematoma. Disc levels: ACDF C5-C7 with interbody spacers. Mild multilevel spondylosis and facet arthropathy. No severe spinal canal narrowing. Upper chest: No acute abnormality. Other: Carotid calcification. IMPRESSION: 1. No acute  intracranial abnormality. 2. No acute fracture in the cervical spine. Electronically Signed   By: Minerva Fester M.D.   On: 06/04/2023 21:40   CT Cervical Spine Wo Contrast Result Date: 06/04/2023 CLINICAL DATA:  Fall, on blood thinners, left-sided head pain. Found on floor. Neck trauma. EXAM: CT HEAD WITHOUT CONTRAST CT CERVICAL SPINE WITHOUT CONTRAST TECHNIQUE: Multidetector CT imaging of the head and cervical spine was performed following the standard protocol without intravenous contrast. Multiplanar CT image reconstructions of the cervical spine were also generated. RADIATION DOSE REDUCTION: This exam was performed according to the departmental dose-optimization program which includes automated exposure control, adjustment of the mA and/or kV according to patient size and/or use of iterative reconstruction technique. COMPARISON:  Cervical spine radiographs 10/23/2012; CT cervical spine 10/07/2012; MRI head 09/02/2019 and CT head 09/02/2019 FINDINGS: CT HEAD FINDINGS Brain: No intracranial hemorrhage, mass effect, or evidence of acute infarct. No hydrocephalus. No extra-axial fluid collection. Age related cerebral atrophy and chronic small vessel ischemic disease. Chronic left occipital infarct. Vascular: No hyperdense vessel. Intracranial arterial calcification. Skull: No fracture or focal lesion. Sinuses/Orbits: No acute finding. Other: None. CT CERVICAL SPINE FINDINGS Alignment: No evidence of traumatic malalignment. Skull base and vertebrae: No acute fracture. No primary bone lesion or focal pathologic process. Soft tissues and spinal canal: No prevertebral fluid or swelling. No visible canal hematoma. Disc levels: ACDF C5-C7 with interbody spacers. Mild multilevel spondylosis and facet arthropathy. No severe spinal canal narrowing. Upper chest: No acute abnormality. Other: Carotid calcification. IMPRESSION: 1. No acute intracranial abnormality. 2. No acute fracture in the cervical spine. Electronically  Signed   By: Minerva Fester M.D.   On: 06/04/2023 21:40    Assessment and Plan Assessment & Plan  1. Primary osteoarthritis of left knee (Primary) Joint line tenderness+ Rom intact  No pain or tenderness on palpation  Pt c/o knee giving out  Will order PT   2. Primary hypertension Bp at goal  Cont with the same    30 min Total time spent for obtaining history,  performing a medically appropriate examination and evaluation, reviewing the tests,  documenting clinical information in the electronic or other health record,  ,care coordination (not separately reported)

## 2023-06-10 ENCOUNTER — Encounter: Payer: Self-pay | Admitting: Sports Medicine

## 2023-06-11 DIAGNOSIS — R29898 Other symptoms and signs involving the musculoskeletal system: Secondary | ICD-10-CM | POA: Diagnosis not present

## 2023-06-11 DIAGNOSIS — M25512 Pain in left shoulder: Secondary | ICD-10-CM | POA: Diagnosis not present

## 2023-06-11 DIAGNOSIS — M6281 Muscle weakness (generalized): Secondary | ICD-10-CM | POA: Diagnosis not present

## 2023-06-13 ENCOUNTER — Ambulatory Visit
Admission: RE | Admit: 2023-06-13 | Discharge: 2023-06-13 | Disposition: A | Discharge status: Home / Self Care | Payer: Medicare PPO | Source: Ambulatory Visit | Attending: Nurse Practitioner | Admitting: Nurse Practitioner

## 2023-06-13 DIAGNOSIS — M6281 Muscle weakness (generalized): Secondary | ICD-10-CM | POA: Diagnosis not present

## 2023-06-13 DIAGNOSIS — M25512 Pain in left shoulder: Secondary | ICD-10-CM | POA: Diagnosis not present

## 2023-06-13 DIAGNOSIS — R29898 Other symptoms and signs involving the musculoskeletal system: Secondary | ICD-10-CM | POA: Diagnosis not present

## 2023-06-13 DIAGNOSIS — M810 Age-related osteoporosis without current pathological fracture: Secondary | ICD-10-CM | POA: Diagnosis not present

## 2023-06-14 DIAGNOSIS — F411 Generalized anxiety disorder: Secondary | ICD-10-CM | POA: Diagnosis not present

## 2023-06-14 DIAGNOSIS — F330 Major depressive disorder, recurrent, mild: Secondary | ICD-10-CM | POA: Diagnosis not present

## 2023-06-18 DIAGNOSIS — M6281 Muscle weakness (generalized): Secondary | ICD-10-CM | POA: Diagnosis not present

## 2023-06-18 DIAGNOSIS — M25512 Pain in left shoulder: Secondary | ICD-10-CM | POA: Diagnosis not present

## 2023-06-18 DIAGNOSIS — R29898 Other symptoms and signs involving the musculoskeletal system: Secondary | ICD-10-CM | POA: Diagnosis not present

## 2023-06-20 DIAGNOSIS — R29898 Other symptoms and signs involving the musculoskeletal system: Secondary | ICD-10-CM | POA: Diagnosis not present

## 2023-06-20 DIAGNOSIS — M6281 Muscle weakness (generalized): Secondary | ICD-10-CM | POA: Diagnosis not present

## 2023-06-20 DIAGNOSIS — M25512 Pain in left shoulder: Secondary | ICD-10-CM | POA: Diagnosis not present

## 2023-06-25 DIAGNOSIS — M25512 Pain in left shoulder: Secondary | ICD-10-CM | POA: Diagnosis not present

## 2023-06-25 DIAGNOSIS — M6281 Muscle weakness (generalized): Secondary | ICD-10-CM | POA: Diagnosis not present

## 2023-06-25 DIAGNOSIS — R29898 Other symptoms and signs involving the musculoskeletal system: Secondary | ICD-10-CM | POA: Diagnosis not present

## 2023-06-27 DIAGNOSIS — M6281 Muscle weakness (generalized): Secondary | ICD-10-CM | POA: Diagnosis not present

## 2023-06-27 DIAGNOSIS — M25512 Pain in left shoulder: Secondary | ICD-10-CM | POA: Diagnosis not present

## 2023-06-27 DIAGNOSIS — R29898 Other symptoms and signs involving the musculoskeletal system: Secondary | ICD-10-CM | POA: Diagnosis not present

## 2023-07-02 DIAGNOSIS — R29898 Other symptoms and signs involving the musculoskeletal system: Secondary | ICD-10-CM | POA: Diagnosis not present

## 2023-07-02 DIAGNOSIS — M25512 Pain in left shoulder: Secondary | ICD-10-CM | POA: Diagnosis not present

## 2023-07-02 DIAGNOSIS — M6281 Muscle weakness (generalized): Secondary | ICD-10-CM | POA: Diagnosis not present

## 2023-07-04 DIAGNOSIS — R2681 Unsteadiness on feet: Secondary | ICD-10-CM | POA: Diagnosis not present

## 2023-07-04 DIAGNOSIS — M25512 Pain in left shoulder: Secondary | ICD-10-CM | POA: Diagnosis not present

## 2023-07-04 DIAGNOSIS — M6281 Muscle weakness (generalized): Secondary | ICD-10-CM | POA: Diagnosis not present

## 2023-07-04 DIAGNOSIS — R29898 Other symptoms and signs involving the musculoskeletal system: Secondary | ICD-10-CM | POA: Diagnosis not present

## 2023-07-09 DIAGNOSIS — R29898 Other symptoms and signs involving the musculoskeletal system: Secondary | ICD-10-CM | POA: Diagnosis not present

## 2023-07-09 DIAGNOSIS — R2681 Unsteadiness on feet: Secondary | ICD-10-CM | POA: Diagnosis not present

## 2023-07-09 DIAGNOSIS — M6281 Muscle weakness (generalized): Secondary | ICD-10-CM | POA: Diagnosis not present

## 2023-07-09 DIAGNOSIS — M25512 Pain in left shoulder: Secondary | ICD-10-CM | POA: Diagnosis not present

## 2023-07-11 DIAGNOSIS — R29898 Other symptoms and signs involving the musculoskeletal system: Secondary | ICD-10-CM | POA: Diagnosis not present

## 2023-07-11 DIAGNOSIS — R2681 Unsteadiness on feet: Secondary | ICD-10-CM | POA: Diagnosis not present

## 2023-07-11 DIAGNOSIS — M6281 Muscle weakness (generalized): Secondary | ICD-10-CM | POA: Diagnosis not present

## 2023-07-11 DIAGNOSIS — M25512 Pain in left shoulder: Secondary | ICD-10-CM | POA: Diagnosis not present

## 2023-07-16 DIAGNOSIS — M6281 Muscle weakness (generalized): Secondary | ICD-10-CM | POA: Diagnosis not present

## 2023-07-16 DIAGNOSIS — M25512 Pain in left shoulder: Secondary | ICD-10-CM | POA: Diagnosis not present

## 2023-07-16 DIAGNOSIS — R29898 Other symptoms and signs involving the musculoskeletal system: Secondary | ICD-10-CM | POA: Diagnosis not present

## 2023-07-16 DIAGNOSIS — R2681 Unsteadiness on feet: Secondary | ICD-10-CM | POA: Diagnosis not present

## 2023-07-18 DIAGNOSIS — M6281 Muscle weakness (generalized): Secondary | ICD-10-CM | POA: Diagnosis not present

## 2023-07-18 DIAGNOSIS — R2681 Unsteadiness on feet: Secondary | ICD-10-CM | POA: Diagnosis not present

## 2023-07-18 DIAGNOSIS — M25512 Pain in left shoulder: Secondary | ICD-10-CM | POA: Diagnosis not present

## 2023-07-18 DIAGNOSIS — F330 Major depressive disorder, recurrent, mild: Secondary | ICD-10-CM | POA: Diagnosis not present

## 2023-07-18 DIAGNOSIS — F411 Generalized anxiety disorder: Secondary | ICD-10-CM | POA: Diagnosis not present

## 2023-07-18 DIAGNOSIS — R29898 Other symptoms and signs involving the musculoskeletal system: Secondary | ICD-10-CM | POA: Diagnosis not present

## 2023-07-19 ENCOUNTER — Non-Acute Institutional Stay: Payer: Self-pay | Admitting: Nurse Practitioner

## 2023-07-19 ENCOUNTER — Encounter: Payer: Self-pay | Admitting: Nurse Practitioner

## 2023-07-19 DIAGNOSIS — M6281 Muscle weakness (generalized): Secondary | ICD-10-CM | POA: Diagnosis not present

## 2023-07-19 DIAGNOSIS — R29898 Other symptoms and signs involving the musculoskeletal system: Secondary | ICD-10-CM | POA: Diagnosis not present

## 2023-07-19 DIAGNOSIS — R269 Unspecified abnormalities of gait and mobility: Secondary | ICD-10-CM

## 2023-07-19 DIAGNOSIS — K219 Gastro-esophageal reflux disease without esophagitis: Secondary | ICD-10-CM | POA: Diagnosis not present

## 2023-07-19 DIAGNOSIS — I10 Essential (primary) hypertension: Secondary | ICD-10-CM | POA: Diagnosis not present

## 2023-07-19 DIAGNOSIS — R2681 Unsteadiness on feet: Secondary | ICD-10-CM | POA: Diagnosis not present

## 2023-07-19 DIAGNOSIS — I2782 Chronic pulmonary embolism: Secondary | ICD-10-CM | POA: Diagnosis not present

## 2023-07-19 DIAGNOSIS — I639 Cerebral infarction, unspecified: Secondary | ICD-10-CM | POA: Diagnosis not present

## 2023-07-19 DIAGNOSIS — M85862 Other specified disorders of bone density and structure, left lower leg: Secondary | ICD-10-CM | POA: Diagnosis not present

## 2023-07-19 DIAGNOSIS — M25562 Pain in left knee: Secondary | ICD-10-CM | POA: Diagnosis not present

## 2023-07-19 DIAGNOSIS — M25512 Pain in left shoulder: Secondary | ICD-10-CM | POA: Diagnosis not present

## 2023-07-19 DIAGNOSIS — F418 Other specified anxiety disorders: Secondary | ICD-10-CM | POA: Diagnosis not present

## 2023-07-19 NOTE — Assessment & Plan Note (Signed)
 Blood pressure is controlled, takes Amlodipine , Lisinopril , off Metoprolol  2/2 HR 50s and c/o daytime sleepiness. Bun/creat 24/2.03 03/05/23

## 2023-07-19 NOTE — Assessment & Plan Note (Signed)
 the left knee giving out, left leg feels weak, difficulty walking. Denied pain in the left knee/leg. No s/s of deformity or injury. Muscle strength 5/5.  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 Takes Plavix . Atorvastatin . affected gait(balance issues and vision issues(seeing Ophthalmology), feels weak left leg.   OA left knee, c/o left knee giving out,  worsened since fall 06/04/23, has been working with PT, no improvement CBC/diff, CMP/eGFR, TSH, Lipids, Vit B12, Vit D, Xray left knee 3 views.  Not new, but progressing, neurology eval

## 2023-07-19 NOTE — Assessment & Plan Note (Signed)
 takes Pantoprazole,  Hgb 12.1 03/05/23

## 2023-07-19 NOTE — Assessment & Plan Note (Signed)
CTA 01/27/22 showed multiple subsegmental pulmonary emboli and pericardial effusion identified, on Eliquis, Plavix, f/u Cardiology. EF 65-70% 01/27/22. Venous US BLE no DVT.

## 2023-07-19 NOTE — Assessment & Plan Note (Signed)
 Stable,  takes Citalopram , Wellbutrin ,  f/u psycho therapy, TSH 2.71 08/23/22

## 2023-07-19 NOTE — Assessment & Plan Note (Signed)
 Worsening 2/2 feeling weaker in the left leg,  falls, power w/c to go further.

## 2023-07-19 NOTE — Progress Notes (Signed)
 Location:   [facility] Room Number: 904 Place of Service:  [facility] (13) Provider: Abner Hoffman Juliane Guest NP  Tye Gall, MD  Patient Care Team: Tye Gall, MD as PCP - General (Internal Medicine) Sheryle Donning, MD as PCP - Cardiology (Cardiology) Dov Dill X, NP as Nurse Practitioner (Internal Medicine) Thurmon Florida., MD (Ophthalmology) Drusilla Gerlach, MD as Consulting Physician (Dermatology) Drusilla Gerlach, MD as Consulting Physician (Dermatology) Lacinda Pica, MD as Consulting Physician (Gynecology)  Extended Emergency Contact Information Primary Emergency Contact: Lindsay,Julie  [place] Mobile Phone: [phone] Relation: Daughter Secondary Emergency Contact: Black,Joel Mobile Phone: [phone] Relation: Son  Code Status: DNR Goals of care: Advanced Directive information    06/04/2023    9:14 PM  Advanced Directives  Does Patient Have a Medical Advance Directive? Yes  Type of Estate agent of [place];Living will     Chief Complaint  Patient presents with   Acute Visit    Left leg weakness, difficulty walking on it.     HPI:  Pt is a 89 y.o. female seen today for an acute visit for the left knee giving out, left leg feels weak, difficulty walking. Denied pain in the left knee/leg. No s/s of deformity or injury. Muscle strength 5/5.  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 Takes Plavix . Atorvastatin . affected gait(balance issues and vision issues(seeing Ophthalmology), feels weak left leg.   OA left knee, c/o left knee giving out,  worsened since fall 06/04/23, has been working with PT, no improvement  Bradycardia, normalized after dc'd Metoprolol , followed by Cardiology              CTA 01/27/22 showed multiple subsegmental pulmonary emboli and pericardial effusion identified, on Eliquis , Plavix , f/u Cardiology. EF 65-70% 01/27/22. Venous US  BLE no  DVT.              HTN, takes Amlodipine , Lisinopril , off Metoprolol  2/2 HR 50s and c/o daytime sleepiness. Bun/creat 24/2.03 03/05/23             Constipation, takes Colace                               GERD, takes Pantoprazole ,  Hgb 12.1 03/05/23             Dysphagia, sometimes coughs when swallowing, worked with ST.              Gait abnormality, falls, power w/c to go further.              Chronic occasional dry cough for about a year, left side throat tickles sometimes during eating, then cough, ? Resultant of CVA.              OP, takes Fosamax , Ca, Vit D, t score -2.3 10/25/20, 06/13/23 DEXA t score -1.8             Depression, takes Citalopram , Wellbutrin ,  f/u psycho therapy, TSH 2.71 08/23/22             Pericardial effusion, Hx of it, present again on CTA 01/27/22             Hyperlipidemia, takes Atorvastatin , LDL 58 08/23/22             OA pain, power w/c             Urinary incontinence, progressing  Past Medical History:  Diagnosis Date   Anginal pain (HCC) 08/24/2010   Echo-EF =>55%,LV normal   Arthritis    Cataract 12/2006   Both eyes  Epes MD   Chest pain, unspecified 09/04/2007   Lexiscan-- EF 72%; LV normal   Constipation    Takes flax seed and honey .  Smooth Move tea.   Depression    Diverticulitis    Head injury, closed    with fall   History of cardiac cath 2012   normal coronary arteries   Hypertension    Mitral valve prolapse    Osteopenia    Stroke (HCC) 09/02/2019   Past Surgical History:  Procedure Laterality Date   ANTERIOR CERVICAL DECOMP/DISCECTOMY FUSION N/A 10/23/2012   Procedure: Cervical Five to Cervical Six, Cervical Six to Cervical Seven anterior cervical decompression with fusion plating and bonegraft;  Surgeon: Corrina Dimitri, MD;  Location: [facility];  Service: Neurosurgery;  Laterality: N/A;  ANTERIOR CERVICAL DECOMPRESSION/DISCECTOMY FUSION 2 LEVELS   BREAST CYST ASPIRATION  1990   benign   CARDIAC CATHETERIZATION  08/01/2010    normal coronaries   COLONOSCOPY  approx 2011   EYE SURGERY Bilateral 2011   intestinal blockage surgery  Feb. 2012   "kink in small intestine" per pt    Allergies  Allergen Reactions   Contrast Media [Iodinated Contrast Media] Hives    Hives during IVP at urology office '02, requires 13 hr prep///[email or web address]  Patient came thru ER and had a 1 hour pre-medication and did fine   Oxycontin  [Oxycodone ] Other (See Comments)    Decreased appetite.   Boniva [Ibandronate] Nausea And Vomiting and Other (See Comments)    Weakness    Codeine Diarrhea and Nausea And Vomiting   Darvocet [Propoxyphene N-Acetaminophen ] Diarrhea and Nausea And Vomiting   Erythromycin Nausea And Vomiting    Allergies as of 07/19/2023       Reactions   Contrast Media [iodinated Contrast Media] Hives   Hives during IVP at urology office '02, requires 13 hr prep///[email or web address] Patient came thru ER and had a 1 hour pre-medication and did fine   Oxycontin  [oxycodone ] Other (See Comments)   Decreased appetite.   Boniva [ibandronate] Nausea And Vomiting, Other (See Comments)   Weakness    Codeine Diarrhea, Nausea And Vomiting   Darvocet [propoxyphene N-acetaminophen ] Diarrhea, Nausea And Vomiting   Erythromycin Nausea And Vomiting        Medication List        Accurate as of Jul 19, 2023 11:59 PM. If you have any questions, ask your nurse or doctor.          acetaminophen  325 MG tablet Commonly known as: TYLENOL  Take 650 mg by mouth at bedtime. 2 entries: 650 mg every 6 hours as needed   alendronate  70 MG tablet Commonly known as: FOSAMAX  TAKE 1 TAB ONCE A WEEK, AT LEAST 30 MIN BEFORE 1ST FOOD.DO NOT LIE DOWN FOR 30 MIN AFTER TAKING.   amLODipine  5 MG tablet Commonly known as: NORVASC  Take 5 mg by mouth in the morning.   atorvastatin  20 MG tablet Commonly known as: LIPITOR TAKE ONE TABLET BY MOUTH ONCE DAILY.   buPROPion  150 MG 24 hr tablet Commonly known as: WELLBUTRIN  XL Take 150 mg by mouth  daily.   Calcium  600 +D High Potency 600-10 MG-MCG Tabs Generic drug: Calcium  Carbonate-Vitamin D  Take 1 tablet by mouth in the morning.   citalopram  20 MG tablet Commonly known as: CELEXA  Take 1 tablet (  20 mg total) by mouth daily.   clopidogrel  75 MG tablet Commonly known as: PLAVIX  TAKE 1 TABLET ONCE DAILY.   diclofenac Sodium 1 % Gel Commonly known as: VOLTAREN Apply 1 g topically 2 (two) times daily.   docusate sodium  100 MG capsule Commonly known as: COLACE Take 100 mg by mouth at bedtime.   Eliquis  5 MG Tabs tablet Generic drug: apixaban  Take 2.5 mg by mouth 2 (two) times daily.   fluticasone 50 MCG/ACT nasal spray Commonly known as: FLONASE Place into both nostrils.   lisinopril  10 MG tablet Commonly known as: ZESTRIL  Take 10 mg by mouth daily.   nystatin cream Commonly known as: MYCOSTATIN Apply to under breasts (rash) topically every 12 hours as needed for moisture associated rash Apply to under breasts (rash) topically two times a day for moisture associated rash for 2 Weeks   pantoprazole  40 MG tablet Commonly known as: PROTONIX  TAKE 1 TABLET ONCE DAILY.   triamcinolone cream 0.1 % Commonly known as: KENALOG Apply to under breast (rash) topically every 12 hours as needed for moisture associated rash Apply to under breasts (rash) topically two times a day for moisture associated rash for 2 Weeks        Review of Systems  Constitutional:  Negative for appetite change, fatigue and fever.  HENT:  Positive for hearing loss and rhinorrhea. Negative for congestion, sinus pressure, sinus pain, sore throat, trouble swallowing and voice change.   Eyes:  Negative for visual disturbance.  Respiratory:  Positive for cough. Negative for chest tightness, shortness of breath and wheezing.        Chronic occasional cough with clear phlegm. DOE since 01/27/22  Cardiovascular:  Negative for leg swelling.  Gastrointestinal:  Negative for abdominal pain and  constipation.  Genitourinary:  Negative for dysuria, frequency and urgency.  Musculoskeletal:  Positive for arthralgias, back pain and gait problem.  Skin:  Negative for color change.  Neurological:  Positive for weakness. Negative for speech difficulty and headaches.       Memory lapses occasionally. Left sided weakness.   Psychiatric/Behavioral:  Negative for behavioral problems and sleep disturbance. The patient is not nervous/anxious.     Immunization History  Administered Date(s) Administered   Fluad Quad(high Dose 65+) 12/17/2018   Influenza, High Dose Seasonal PF 12/08/2016, 12/19/2020   Influenza-Unspecified 12/16/2019, 12/25/2021, 12/05/2022   Moderna Sars-Covid-2 Vaccination 03/09/2019, 04/06/2019, 01/12/2020, 12/19/2022   Pfizer Covid-19 Vaccine Bivalent Booster 74yrs & up 01/04/2022   Pneumococcal Conjugate-13 06/12/2013   Pneumococcal Polysaccharide-23 07/28/2002   RSV,unspecified 04/05/2022   Tdap 07/27/2009, 04/03/2023   Zoster Recombinant(Shingrix) 01/08/2017, 06/03/2017   Pertinent  Health Maintenance Due  Topic Date Due   INFLUENZA VACCINE  10/04/2023   DEXA SCAN  Completed      01/27/2022   10:25 AM 01/28/2022    8:15 AM 03/13/2022    9:11 AM 03/16/2022    2:03 PM 08/22/2022    9:06 AM  Fall Risk  Falls in the past year?   0 0 0  Was there an injury with Fall?   0 0 0  Fall Risk Category Calculator   0 0 0  Fall Risk Category (Retired)   Low Low   (RETIRED) Patient Fall Risk Level Low fall risk Moderate fall risk Low fall risk Low fall risk   Patient at Risk for Falls Due to   No Fall Risks No Fall Risks No Fall Risks  Fall risk Follow up   Falls evaluation completed Falls  evaluation completed    Functional Status Survey:    Vitals:   07/19/23 1338  BP: 116/76  Pulse: 73  Resp: 19  Temp: 97.9 F (36.6 C)  SpO2: 96%  Weight: 144 lb 12.8 oz (65.7 kg)   Body mass index is 23.37 kg/m. Physical Exam Vitals and nursing note reviewed.   Constitutional:      Appearance: Normal appearance.  HENT:     Head: Normocephalic and atraumatic.     Nose: Nose normal.     Mouth/Throat:     Mouth: Mucous membranes are moist.  Eyes:     Extraocular Movements: Extraocular movements intact.     Conjunctiva/sclera: Conjunctivae normal.     Pupils: Pupils are equal, round, and reactive to light.     Comments: Lateral right eye peripheral vision field loss since CVA 11/2018.  Cardiovascular:     Rate and Rhythm: Normal rate and regular rhythm.     Heart sounds: No murmur heard. Pulmonary:     Effort: Pulmonary effort is normal.     Breath sounds: No rales.  Abdominal:     General: Bowel sounds are normal.     Palpations: Abdomen is soft.     Tenderness: There is no abdominal tenderness.  Musculoskeletal:     Cervical back: Normal range of motion and neck supple.     Right lower leg: No edema.     Left lower leg: No edema.     Comments: Kyphoscoliosis. Left shoulder over head ROM decreased.   Skin:    General: Skin is warm and dry.     Comments:    Neurological:     Mental Status: She is alert and oriented to person, place, and time. Mental status is at baseline.     Motor: Weakness present.     Coordination: Coordination normal.     Gait: Gait abnormal.     Deep Tendon Reflexes: Reflexes normal.     Comments: Left sided weakness with muscle strength 5/5. W/c for mobility   Psychiatric:        Mood and Affect: Mood normal.        Behavior: Behavior normal.     Labs reviewed: Recent Labs    08/23/22 0000 03/05/23 0000  NA 140 138  K 3.9 3.6  CL 103 103  CO2 31* 25*  BUN 16 24*  CREATININE 0.9 1.0  CALCIUM  9.8 9.2   Recent Labs    08/23/22 0000 03/05/23 0000  AST 19 25  ALT 11 19  ALKPHOS 62 43  ALBUMIN 4.4 3.8   Recent Labs    08/23/22 0000 03/05/23 0000  WBC 5.0 6.1  NEUTROABS 2,875.00 4,477.00  HGB 13.3 12.1  HCT 40 37  PLT 234 229   Lab Results  Component Value Date   TSH 2.71 08/23/2022    Lab Results  Component Value Date   HGBA1C 5.3 09/03/2019   Lab Results  Component Value Date   CHOL 124 08/23/2022   HDL 51 08/23/2022   LDLCALC 58 08/23/2022   TRIG 70 08/23/2022   CHOLHDL 2.7 10/19/2021    Significant Diagnostic Results in last 30 days:  No results found.  Assessment/Plan: Ischemic stroke (HCC) the left knee giving out, left leg feels weak, difficulty walking. Denied pain in the left knee/leg. No s/s of deformity or injury. Muscle strength 5/5.  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 Takes Plavix . Atorvastatin . affected gait(balance  issues and vision issues(seeing Ophthalmology), feels weak left leg.   OA left knee, c/o left knee giving out,  worsened since fall 06/04/23, has been working with PT, no improvement CBC/diff, CMP/eGFR, TSH, Lipids, Vit B12, Vit D, Xray left knee 3 views.  Not new, but progressing, neurology eval    Pulmonary embolus (HCC) CTA 01/27/22 showed multiple subsegmental pulmonary emboli and pericardial effusion identified, on Eliquis , Plavix , f/u Cardiology. EF 65-70% 01/27/22. Venous US  BLE no DVT.   Hypertension Blood pressure is controlled, takes Amlodipine , Lisinopril , off Metoprolol  2/2 HR 50s and c/o daytime sleepiness. Bun/creat 24/2.03 03/05/23  GERD (gastroesophageal reflux disease)  takes Pantoprazole ,  Hgb 12.1 03/05/23  Gait abnormality Worsening 2/2 feeling weaker in the left leg,  falls, power w/c to go further.   Depression with anxiety Stable,  takes Citalopram , Wellbutrin ,  f/u psycho therapy, TSH 2.71 08/23/22    Family/ staff Communication: plan of care reviewed with the patient and charge nurse.   Labs/tests ordered:  CBC/diff, CMP/eGFR, TSH, Lipids, Vit B12, Vit D, X-ray left knee 3 views.

## 2023-07-23 DIAGNOSIS — M25512 Pain in left shoulder: Secondary | ICD-10-CM | POA: Diagnosis not present

## 2023-07-23 DIAGNOSIS — N183 Chronic kidney disease, stage 3 unspecified: Secondary | ICD-10-CM | POA: Diagnosis not present

## 2023-07-23 DIAGNOSIS — E782 Mixed hyperlipidemia: Secondary | ICD-10-CM | POA: Diagnosis not present

## 2023-07-23 DIAGNOSIS — I10 Essential (primary) hypertension: Secondary | ICD-10-CM | POA: Diagnosis not present

## 2023-07-23 DIAGNOSIS — R2681 Unsteadiness on feet: Secondary | ICD-10-CM | POA: Diagnosis not present

## 2023-07-23 DIAGNOSIS — M6281 Muscle weakness (generalized): Secondary | ICD-10-CM | POA: Diagnosis not present

## 2023-07-23 DIAGNOSIS — R29898 Other symptoms and signs involving the musculoskeletal system: Secondary | ICD-10-CM | POA: Diagnosis not present

## 2023-07-23 LAB — BASIC METABOLIC PANEL WITH GFR
BUN: 16 (ref 4–21)
CO2: 27 — AB (ref 13–22)
Chloride: 106 (ref 99–108)
Creatinine: 0.8 (ref 0.5–1.1)
Glucose: 88
Potassium: 3.8 meq/L (ref 3.5–5.1)
Sodium: 141 (ref 137–147)

## 2023-07-23 LAB — HEPATIC FUNCTION PANEL
ALT: 13 U/L (ref 7–35)
AST: 19 (ref 13–35)
Alkaline Phosphatase: 60 (ref 25–125)
Bilirubin, Total: 0.4

## 2023-07-23 LAB — CBC AND DIFFERENTIAL
HCT: 37 (ref 36–46)
Hemoglobin: 12 (ref 12.0–16.0)
Neutrophils Absolute: 3052
Platelets: 277 K/uL (ref 150–400)
WBC: 5.6

## 2023-07-23 LAB — VITAMIN D 25 HYDROXY (VIT D DEFICIENCY, FRACTURES): Vit D, 25-Hydroxy: 38

## 2023-07-23 LAB — LIPID PANEL
Cholesterol: 124 (ref 0–200)
HDL: 52 (ref 35–70)
LDL Cholesterol: 61
Triglycerides: 38 — AB (ref 40–160)

## 2023-07-23 LAB — COMPREHENSIVE METABOLIC PANEL WITH GFR
Albumin: 3.9 (ref 3.5–5.0)
Calcium: 9.7 (ref 8.7–10.7)
Globulin: 2.2
eGFR: 69

## 2023-07-23 LAB — VITAMIN B12: Vitamin B-12: 186

## 2023-07-23 LAB — TSH: TSH: 2.66 (ref 0.41–5.90)

## 2023-07-23 LAB — CBC: RBC: 3.89 (ref 3.87–5.11)

## 2023-07-25 DIAGNOSIS — M25512 Pain in left shoulder: Secondary | ICD-10-CM | POA: Diagnosis not present

## 2023-07-25 DIAGNOSIS — R29898 Other symptoms and signs involving the musculoskeletal system: Secondary | ICD-10-CM | POA: Diagnosis not present

## 2023-07-25 DIAGNOSIS — R2681 Unsteadiness on feet: Secondary | ICD-10-CM | POA: Diagnosis not present

## 2023-07-25 DIAGNOSIS — M6281 Muscle weakness (generalized): Secondary | ICD-10-CM | POA: Diagnosis not present

## 2023-07-30 DIAGNOSIS — M6281 Muscle weakness (generalized): Secondary | ICD-10-CM | POA: Diagnosis not present

## 2023-07-30 DIAGNOSIS — R2681 Unsteadiness on feet: Secondary | ICD-10-CM | POA: Diagnosis not present

## 2023-07-30 DIAGNOSIS — R29898 Other symptoms and signs involving the musculoskeletal system: Secondary | ICD-10-CM | POA: Diagnosis not present

## 2023-07-30 DIAGNOSIS — M25512 Pain in left shoulder: Secondary | ICD-10-CM | POA: Diagnosis not present

## 2023-08-01 DIAGNOSIS — M25512 Pain in left shoulder: Secondary | ICD-10-CM | POA: Diagnosis not present

## 2023-08-01 DIAGNOSIS — M6281 Muscle weakness (generalized): Secondary | ICD-10-CM | POA: Diagnosis not present

## 2023-08-01 DIAGNOSIS — R29898 Other symptoms and signs involving the musculoskeletal system: Secondary | ICD-10-CM | POA: Diagnosis not present

## 2023-08-01 DIAGNOSIS — R2681 Unsteadiness on feet: Secondary | ICD-10-CM | POA: Diagnosis not present

## 2023-08-06 ENCOUNTER — Telehealth: Admitting: *Deleted

## 2023-08-06 DIAGNOSIS — R29898 Other symptoms and signs involving the musculoskeletal system: Secondary | ICD-10-CM | POA: Diagnosis not present

## 2023-08-06 DIAGNOSIS — M6281 Muscle weakness (generalized): Secondary | ICD-10-CM | POA: Diagnosis not present

## 2023-08-06 DIAGNOSIS — R2681 Unsteadiness on feet: Secondary | ICD-10-CM | POA: Diagnosis not present

## 2023-08-06 DIAGNOSIS — M25512 Pain in left shoulder: Secondary | ICD-10-CM | POA: Diagnosis not present

## 2023-08-06 NOTE — Telephone Encounter (Signed)
 Adriana [name] with [facility] called and stated that patient was needing a Neurology referral but they did not have an order but then Adriana [name] stated that they will call Neurology and get patient an appointment and will call us  if any problems since [place] is [place]

## 2023-08-08 DIAGNOSIS — M6281 Muscle weakness (generalized): Secondary | ICD-10-CM | POA: Diagnosis not present

## 2023-08-08 DIAGNOSIS — R29898 Other symptoms and signs involving the musculoskeletal system: Secondary | ICD-10-CM | POA: Diagnosis not present

## 2023-08-08 DIAGNOSIS — R2681 Unsteadiness on feet: Secondary | ICD-10-CM | POA: Diagnosis not present

## 2023-08-08 DIAGNOSIS — M25512 Pain in left shoulder: Secondary | ICD-10-CM | POA: Diagnosis not present

## 2023-08-13 DIAGNOSIS — M25512 Pain in left shoulder: Secondary | ICD-10-CM | POA: Diagnosis not present

## 2023-08-13 DIAGNOSIS — R29898 Other symptoms and signs involving the musculoskeletal system: Secondary | ICD-10-CM | POA: Diagnosis not present

## 2023-08-13 DIAGNOSIS — M6281 Muscle weakness (generalized): Secondary | ICD-10-CM | POA: Diagnosis not present

## 2023-08-13 DIAGNOSIS — R2681 Unsteadiness on feet: Secondary | ICD-10-CM | POA: Diagnosis not present

## 2023-08-14 DIAGNOSIS — F411 Generalized anxiety disorder: Secondary | ICD-10-CM | POA: Diagnosis not present

## 2023-08-14 DIAGNOSIS — F330 Major depressive disorder, recurrent, mild: Secondary | ICD-10-CM | POA: Diagnosis not present

## 2023-08-15 DIAGNOSIS — M6281 Muscle weakness (generalized): Secondary | ICD-10-CM | POA: Diagnosis not present

## 2023-08-15 DIAGNOSIS — R2681 Unsteadiness on feet: Secondary | ICD-10-CM | POA: Diagnosis not present

## 2023-08-15 DIAGNOSIS — R29898 Other symptoms and signs involving the musculoskeletal system: Secondary | ICD-10-CM | POA: Diagnosis not present

## 2023-08-15 DIAGNOSIS — M25512 Pain in left shoulder: Secondary | ICD-10-CM | POA: Diagnosis not present

## 2023-08-20 DIAGNOSIS — R29898 Other symptoms and signs involving the musculoskeletal system: Secondary | ICD-10-CM | POA: Diagnosis not present

## 2023-08-20 DIAGNOSIS — M25512 Pain in left shoulder: Secondary | ICD-10-CM | POA: Diagnosis not present

## 2023-08-20 DIAGNOSIS — R2681 Unsteadiness on feet: Secondary | ICD-10-CM | POA: Diagnosis not present

## 2023-08-20 DIAGNOSIS — M6281 Muscle weakness (generalized): Secondary | ICD-10-CM | POA: Diagnosis not present

## 2023-08-22 DIAGNOSIS — R29898 Other symptoms and signs involving the musculoskeletal system: Secondary | ICD-10-CM | POA: Diagnosis not present

## 2023-08-22 DIAGNOSIS — R2681 Unsteadiness on feet: Secondary | ICD-10-CM | POA: Diagnosis not present

## 2023-08-22 DIAGNOSIS — M25512 Pain in left shoulder: Secondary | ICD-10-CM | POA: Diagnosis not present

## 2023-08-22 DIAGNOSIS — M6281 Muscle weakness (generalized): Secondary | ICD-10-CM | POA: Diagnosis not present

## 2023-08-26 ENCOUNTER — Encounter: Payer: Self-pay | Admitting: Nurse Practitioner

## 2023-08-26 ENCOUNTER — Non-Acute Institutional Stay: Payer: Self-pay | Admitting: Nurse Practitioner

## 2023-08-26 DIAGNOSIS — I2782 Chronic pulmonary embolism: Secondary | ICD-10-CM

## 2023-08-26 DIAGNOSIS — I10 Essential (primary) hypertension: Secondary | ICD-10-CM | POA: Diagnosis not present

## 2023-08-26 DIAGNOSIS — E785 Hyperlipidemia, unspecified: Secondary | ICD-10-CM | POA: Diagnosis not present

## 2023-08-26 DIAGNOSIS — M810 Age-related osteoporosis without current pathological fracture: Secondary | ICD-10-CM

## 2023-08-26 DIAGNOSIS — K219 Gastro-esophageal reflux disease without esophagitis: Secondary | ICD-10-CM

## 2023-08-26 DIAGNOSIS — R1310 Dysphagia, unspecified: Secondary | ICD-10-CM

## 2023-08-26 DIAGNOSIS — K5901 Slow transit constipation: Secondary | ICD-10-CM | POA: Diagnosis not present

## 2023-08-26 DIAGNOSIS — B379 Candidiasis, unspecified: Secondary | ICD-10-CM

## 2023-08-26 DIAGNOSIS — R269 Unspecified abnormalities of gait and mobility: Secondary | ICD-10-CM | POA: Diagnosis not present

## 2023-08-26 DIAGNOSIS — F418 Other specified anxiety disorders: Secondary | ICD-10-CM

## 2023-08-26 NOTE — Progress Notes (Unsigned)
 Location:   [facility]  Nursing Home Room Number: 904-A Place of Service:  ALF [phone]) Provider:  Kwaku Mostafa, NP  PCP: Sherlynn Madden, MD  Patient Care Team: Sherlynn Madden, MD as PCP - General (Internal Medicine) Lonni Slain, MD as PCP - Cardiology (Cardiology) Sully Manzi X, NP as Nurse Practitioner (Internal Medicine) Debarah Lorrene DEL., MD (Ophthalmology) Joshua Sieving, MD as Consulting Physician (Dermatology) Joshua Sieving, MD as Consulting Physician (Dermatology) Darina Barrio, MD as Consulting Physician (Gynecology)  Extended Emergency Contact Information Primary Emergency Contact: Manuelita Clarity  [place] Mobile Phone: [phone] Relation: Daughter Secondary Emergency Contact: Black,Joel Mobile Phone: [phone] Relation: Son  Code Status:  DNR Goals of care: Advanced Directive information    08/26/2023   12:15 PM  Advanced Directives  Does Patient Have a Medical Advance Directive? Yes  Type of Estate agent of [place];Living will;Out of facility DNR (pink MOST or yellow form)  Does patient want to make changes to medical advance directive? No - Patient declined  Copy of Healthcare Power of Attorney in Chart? Yes - validated most recent copy scanned in chart (See row information)     Chief Complaint  Patient presents with  . Medical Management of Chronic Issues    Routine Visit. Discuss the need for Covid Booster.    HPI:  Pt is a 89 y.o. female seen today for medical management of chronic diseases.     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 Takes Plavix . Atorvastatin . affected gait(balance issues and vision issues(seeing Ophthalmology), feels weak left leg even if muscle strength 5/5             Bradycardia, normalized after dc'd Metoprolol , followed by Cardiology              CTA 01/27/22 showed multiple subsegmental pulmonary emboli and  pericardial effusion identified, on Eliquis , Plavix , f/u Cardiology. EF 65-70% 01/27/22. Venous US  BLE no DVT.              HTN, takes Amlodipine , Lisinopril , off Metoprolol  2/2 HR 50s and c/o daytime sleepiness. Bun/creat 24/2.03 03/05/23             Constipation, takes Colace                               GERD, takes Pantoprazole ,  Hgb 12.1 03/05/23             Dysphagia, sometimes coughs when swallowing, worked with ST.              Gait abnormality, risk of  falling, power w/c for mobility.              Chronic occasional dry cough for about a year, left side throat tickles sometimes during eating, then cough, ? Resultant of CVA.              OP, takes Fosamax , Ca, Vit D, t score -2.3 10/25/20, 06/13/23 DEXA t score -1.8             Depression, takes Citalopram , Wellbutrin ,  f/u psycho therapy, TSH 2.71 08/23/22             Pericardial effusion, Hx of it, present again on CTA 01/27/22             Hyperlipidemia, takes Atorvastatin , LDL 58 08/23/22  OA pain, power w/c             Urinary incontinence, progressing  Hx of skin candidiasis, under breast, arms, Triamcinolone and Nystatin cream effective.         Past Medical History:  Diagnosis Date  . Anginal pain (HCC) 08/24/2010   Echo-EF =>55%,LV normal  . Arthritis   . Cataract 12/2006   Both eyes  Epes MD  . Chest pain, unspecified 09/04/2007   Lexiscan-- EF 72%; LV normal  . Constipation    Takes flax seed and honey .  Smooth Move tea.  . Depression   . Diverticulitis   . Head injury, closed    with fall  . History of cardiac cath 2012   normal coronary arteries  . Hypertension   . Mitral valve prolapse   . Osteopenia   . Stroke [facility]) 09/02/2019   Past Surgical History:  Procedure Laterality Date  . ANTERIOR CERVICAL DECOMP/DISCECTOMY FUSION N/A 10/23/2012   Procedure: Cervical Five to Cervical Six, Cervical Six to Cervical Seven anterior cervical decompression with fusion plating and bonegraft;  Surgeon: Lamar LELON Peaches, MD;  Location: [facility];  Service: Neurosurgery;  Laterality: N/A;  ANTERIOR CERVICAL DECOMPRESSION/DISCECTOMY FUSION 2 LEVELS  . BREAST CYST ASPIRATION  1990   benign  . CARDIAC CATHETERIZATION  08/01/2010   normal coronaries  . COLONOSCOPY  approx 2011  . EYE SURGERY Bilateral 2011  . intestinal blockage surgery  Feb. 2012   kink in small intestine per pt    Allergies  Allergen Reactions  . Contrast Media [Iodinated Contrast Media] Hives    Hives during IVP at urology office '02, requires 13 hr prep///[email or web address]  Patient came thru ER and had a 1 hour pre-medication and did fine  . Oxycontin  [Oxycodone ] Other (See Comments)    Decreased appetite.  . Boniva [Ibandronate] Nausea And Vomiting and Other (See Comments)    Weakness   . Codeine Diarrhea and Nausea And Vomiting  . Darvocet [Propoxyphene N-Acetaminophen ] Diarrhea and Nausea And Vomiting  . Erythromycin Nausea And Vomiting    Allergies as of 08/26/2023       Reactions   Contrast Media [iodinated Contrast Media] Hives   Hives during IVP at urology office '02, requires 13 hr prep///a.[email or web address] Patient came thru ER and had a 1 hour pre-medication and did fine   Oxycontin  [oxycodone ] Other (See Comments)   Decreased appetite.   Boniva [ibandronate] Nausea And Vomiting, Other (See Comments)   Weakness    Codeine Diarrhea, Nausea And Vomiting   Darvocet [propoxyphene N-acetaminophen ] Diarrhea, Nausea And Vomiting   Erythromycin Nausea And Vomiting        Medication List        Accurate as of August 26, 2023  4:11 PM. If you have any questions, ask your nurse or doctor.          STOP taking these medications    diclofenac Sodium 1 % Gel Commonly known as: VOLTAREN Stopped by: Letia Guidry X Maybelle Depaoli   fluticasone 50 MCG/ACT nasal spray Commonly known as: FLONASE Stopped by: Samera Macy X Shady Padron       TAKE these medications    acetaminophen  325 MG tablet Commonly known as: TYLENOL  Take 650 mg by mouth at  bedtime. 2 entries: 650 mg every 6 hours as needed   acetaminophen  325 MG tablet Commonly known as: TYLENOL  Take 650 mg by mouth every 6 (six) hours as needed.   alendronate  70 MG tablet Commonly known as: FOSAMAX   TAKE 1 TAB ONCE A WEEK, AT LEAST 30 MIN BEFORE 1ST FOOD.DO NOT LIE DOWN FOR 30 MIN AFTER TAKING.   amLODipine  5 MG tablet Commonly known as: NORVASC  Take 5 mg by mouth in the morning.   atorvastatin  20 MG tablet Commonly known as: LIPITOR TAKE ONE TABLET BY MOUTH ONCE DAILY.   buPROPion  300 MG 24 hr tablet Commonly known as: WELLBUTRIN  XL Take 300 mg by mouth daily. What changed: Another medication with the same name was removed. Continue taking this medication, and follow the directions you see here. Changed by: Mykira Hofmeister X Emeri Estill   Calcium  600 +D High Potency 600-10 MG-MCG Tabs Generic drug: Calcium  Carbonate-Vitamin D  Take 1 tablet by mouth in the morning.   carboxymethylcellulose 0.5 % Soln Commonly known as: REFRESH PLUS Place 1 drop into both eyes 2 (two) times daily.   citalopram  20 MG tablet Commonly known as: CELEXA  Take 1 tablet (20 mg total) by mouth daily.   clopidogrel  75 MG tablet Commonly known as: PLAVIX  TAKE 1 TABLET ONCE DAILY.   docusate sodium  100 MG capsule Commonly known as: COLACE Take 100 mg by mouth at bedtime.   Eliquis  5 MG Tabs tablet Generic drug: apixaban  Take 2.5 mg by mouth 2 (two) times daily.   lisinopril  10 MG tablet Commonly known as: ZESTRIL  Take 10 mg by mouth daily.   loratadine 10 MG tablet Commonly known as: CLARITIN Take 10 mg by mouth daily.   nystatin cream Commonly known as: MYCOSTATIN Apply to under breasts (rash) topically every 12 hours as needed for moisture associated rash Apply to under breasts (rash) topically two times a day for moisture associated rash for 2 Weeks   pantoprazole  40 MG tablet Commonly known as: PROTONIX  TAKE 1 TABLET ONCE DAILY.   triamcinolone cream 0.1 % Commonly known as:  KENALOG Apply to under breast (rash) topically every 12 hours as needed for moisture associated rash Apply to under breasts (rash) topically two times a day for moisture associated rash for 2 Weeks        Review of Systems  Constitutional:  Negative for appetite change, fatigue and fever.  HENT:  Positive for hearing loss and rhinorrhea. Negative for congestion, sinus pressure, sinus pain, sore throat, trouble swallowing and voice change.   Eyes:  Negative for visual disturbance.  Respiratory:  Positive for cough. Negative for chest tightness, shortness of breath and wheezing.        Chronic occasional cough with clear phlegm. DOE since 01/27/22  Cardiovascular:  Negative for leg swelling.  Gastrointestinal:  Negative for abdominal pain and constipation.  Genitourinary:  Negative for dysuria, frequency and urgency.       Occasional urinary leakage   Musculoskeletal:  Positive for arthralgias, back pain and gait problem.  Skin:  Negative for rash.  Neurological:  Positive for weakness. Negative for speech difficulty and headaches.       Memory lapses occasionally. Left sided weakness.   Psychiatric/Behavioral:  Negative for behavioral problems and sleep disturbance. The patient is not nervous/anxious.     Immunization History  Administered Date(s) Administered  . Fluad Quad(high Dose 65+) 12/17/2018  . Influenza, High Dose Seasonal PF 12/08/2016, 12/19/2020  . Influenza-Unspecified 12/16/2019, 12/25/2021, 12/05/2022  . Moderna Sars-Covid-2 Vaccination 03/09/2019, 04/06/2019, 01/12/2020, 12/19/2022  . Pfizer Covid-19 Vaccine Bivalent Booster 66yrs & up 01/04/2022  . Pneumococcal Conjugate-13 06/12/2013  . Pneumococcal Polysaccharide-23 07/28/2002  . RSV,unspecified 04/05/2022  . Tdap 07/27/2009, 04/03/2023  . Zoster Recombinant(Shingrix) 01/08/2017, 06/03/2017   Pertinent  Health Maintenance Due  Topic Date Due  . INFLUENZA VACCINE  10/04/2023  . DEXA SCAN  Completed       01/27/2022   10:25 AM 01/28/2022    8:15 AM 03/13/2022    9:11 AM 03/16/2022    2:03 PM 08/22/2022    9:06 AM  Fall Risk  Falls in the past year?   0 0 0  Was there an injury with Fall?   0 0 0  Fall Risk Category Calculator   0 0 0  Fall Risk Category (Retired)   Low  Low    (RETIRED) Patient Fall Risk Level Low fall risk  Moderate fall risk  Low fall risk  Low fall risk    Patient at Risk for Falls Due to   No Fall Risks No Fall Risks No Fall Risks  Fall risk Follow up   Falls evaluation completed  Falls evaluation completed       Data saved with a previous flowsheet row definition   Functional Status Survey:    Vitals:   08/26/23 1203  BP: 114/72  Pulse: 71  Resp: 17  Temp: (!) 97.3 F (36.3 C)  SpO2: 97%  Weight: 147 lb 9.6 oz (67 kg)  Height: 5' 6 (1.676 m)   Body mass index is 23.82 kg/m. Physical Exam Vitals and nursing note reviewed.  Constitutional:      Appearance: Normal appearance.  HENT:     Head: Normocephalic and atraumatic.     Nose: Nose normal.     Mouth/Throat:     Mouth: Mucous membranes are moist.   Eyes:     Extraocular Movements: Extraocular movements intact.     Conjunctiva/sclera: Conjunctivae normal.     Pupils: Pupils are equal, round, and reactive to light.     Comments: Lateral right eye peripheral vision field loss since CVA 11/2018.   Cardiovascular:     Rate and Rhythm: Normal rate and regular rhythm.     Heart sounds: No murmur heard. Pulmonary:     Effort: Pulmonary effort is normal.     Breath sounds: No rales.  Abdominal:     General: Bowel sounds are normal.     Palpations: Abdomen is soft.     Tenderness: There is no abdominal tenderness.   Musculoskeletal:     Cervical back: Normal range of motion and neck supple.     Right lower leg: No edema.     Left lower leg: No edema.     Comments: Kyphoscoliosis. Left shoulder over head ROM decreased.    Skin:    General: Skin is warm and dry.     Findings: No rash.      Comments:     Neurological:     Mental Status: She is alert and oriented to person, place, and time. Mental status is at baseline.     Motor: Weakness present.     Coordination: Coordination normal.     Gait: Gait abnormal.     Deep Tendon Reflexes: Reflexes normal.     Comments: Left sided weakness with muscle strength 5/5. W/c for mobility   Psychiatric:        Mood and Affect: Mood normal.        Behavior: Behavior normal.    Labs reviewed: Recent Labs    03/05/23 0000  NA 138  K 3.6  CL 103  CO2 25*  BUN 24*  CREATININE 1.0  CALCIUM  9.2   Recent Labs  03/05/23 0000  AST 25  ALT 19  ALKPHOS 43  ALBUMIN 3.8   Recent Labs    03/05/23 0000  WBC 6.1  NEUTROABS 4,477.00  HGB 12.1  HCT 37  PLT 229   Lab Results  Component Value Date   TSH 2.71 08/23/2022   Lab Results  Component Value Date   HGBA1C 5.3 09/03/2019   Lab Results  Component Value Date   CHOL 124 08/23/2022   HDL 51 08/23/2022   LDLCALC 58 08/23/2022   TRIG 70 08/23/2022   CHOLHDL 2.7 10/19/2021    Significant Diagnostic Results in last 30 days:  No results found.  Assessment/Plan Hypertension Blood pressure is controlled,  takes Amlodipine , Lisinopril , off Metoprolol  2/2 HR 50s and c/o daytime sleepiness. Bun/creat 24/2.03 03/05/23  Slow transit constipation Stable, takes Colace     GERD (gastroesophageal reflux disease) Stable, takes Pantoprazole ,  Hgb 12.1 03/05/23  Dysphagia  sometimes coughs when swallowing, worked with ST.   Gait abnormality  risk of  falling, power w/c for mobility.   Osteoporosis takes Fosamax , Ca, Vit D, t score -2.3 10/25/20, 06/13/23 DEXA t score -1.8  Depression with anxiety Stable,  takes Citalopram , Wellbutrin ,  f/u psycho therapy, TSH 2.71 08/23/22  Hyperlipidemia takes Atorvastatin , LDL 58 08/23/22  Candidiasis Hx of skin candidiasis, under breast, arms, Triamcinolone and Nystatin cream effective. Allow the patient to keep creams at  bedside and self administering .  Pulmonary embolus (HCC) CTA 01/27/22 showed multiple subsegmental pulmonary emboli and pericardial effusion identified, on Eliquis , Plavix , f/u Cardiology. EF 65-70% 01/27/22. Venous US  BLE no DVT.      Family/ staff Communication: plan of care reviewed with the patient and charge nurse.   Labs/tests ordered:  none

## 2023-08-26 NOTE — Assessment & Plan Note (Signed)
 Stable, takes Colace.

## 2023-08-26 NOTE — Assessment & Plan Note (Signed)
 Stable,  takes Citalopram , Wellbutrin ,  f/u psycho therapy, TSH 2.71 08/23/22

## 2023-08-26 NOTE — Assessment & Plan Note (Signed)
 Blood pressure is controlled, takes Amlodipine , Lisinopril , off Metoprolol  2/2 HR 50s and c/o daytime sleepiness. Bun/creat 24/2.03 03/05/23

## 2023-08-26 NOTE — Assessment & Plan Note (Signed)
 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 Takes Plavix . Atorvastatin . affected gait(balance issues and vision issues(seeing Ophthalmology), feels weak left leg even if muscle strength 5/5

## 2023-08-26 NOTE — Assessment & Plan Note (Signed)
 takes Fosamax , Ca, Vit D, t score -2.3 10/25/20, 06/13/23 DEXA t score -1.8

## 2023-08-26 NOTE — Assessment & Plan Note (Signed)
 sometimes coughs when swallowing, worked with ST.

## 2023-08-26 NOTE — Assessment & Plan Note (Signed)
 Hx of skin candidiasis, under breast, arms, Triamcinolone and Nystatin cream effective. Allow the patient to keep creams at bedside and self administering .

## 2023-08-26 NOTE — Assessment & Plan Note (Signed)
takes Atorvastatin, LDL 58 08/23/22

## 2023-08-26 NOTE — Assessment & Plan Note (Signed)
 risk of  falling, power w/c for mobility.

## 2023-08-26 NOTE — Assessment & Plan Note (Signed)
 Stable, takes Pantoprazole ,  Hgb 12.1 03/05/23

## 2023-08-26 NOTE — Assessment & Plan Note (Signed)
CTA 01/27/22 showed multiple subsegmental pulmonary emboli and pericardial effusion identified, on Eliquis, Plavix, f/u Cardiology. EF 65-70% 01/27/22. Venous US BLE no DVT.

## 2023-08-27 ENCOUNTER — Encounter: Payer: Self-pay | Admitting: Nurse Practitioner

## 2023-08-27 DIAGNOSIS — M25512 Pain in left shoulder: Secondary | ICD-10-CM | POA: Diagnosis not present

## 2023-08-27 DIAGNOSIS — R29898 Other symptoms and signs involving the musculoskeletal system: Secondary | ICD-10-CM | POA: Diagnosis not present

## 2023-08-27 DIAGNOSIS — R2681 Unsteadiness on feet: Secondary | ICD-10-CM | POA: Diagnosis not present

## 2023-08-27 DIAGNOSIS — M6281 Muscle weakness (generalized): Secondary | ICD-10-CM | POA: Diagnosis not present

## 2023-08-29 DIAGNOSIS — M25512 Pain in left shoulder: Secondary | ICD-10-CM | POA: Diagnosis not present

## 2023-08-29 DIAGNOSIS — R2681 Unsteadiness on feet: Secondary | ICD-10-CM | POA: Diagnosis not present

## 2023-08-29 DIAGNOSIS — M6281 Muscle weakness (generalized): Secondary | ICD-10-CM | POA: Diagnosis not present

## 2023-08-29 DIAGNOSIS — R29898 Other symptoms and signs involving the musculoskeletal system: Secondary | ICD-10-CM | POA: Diagnosis not present

## 2023-09-03 DIAGNOSIS — H53461 Homonymous bilateral field defects, right side: Secondary | ICD-10-CM | POA: Diagnosis not present

## 2023-09-03 DIAGNOSIS — Z961 Presence of intraocular lens: Secondary | ICD-10-CM | POA: Diagnosis not present

## 2023-09-03 DIAGNOSIS — H35033 Hypertensive retinopathy, bilateral: Secondary | ICD-10-CM | POA: Diagnosis not present

## 2023-09-05 DIAGNOSIS — M6281 Muscle weakness (generalized): Secondary | ICD-10-CM | POA: Diagnosis not present

## 2023-09-05 DIAGNOSIS — R2681 Unsteadiness on feet: Secondary | ICD-10-CM | POA: Diagnosis not present

## 2023-09-10 DIAGNOSIS — L308 Other specified dermatitis: Secondary | ICD-10-CM | POA: Diagnosis not present

## 2023-09-10 DIAGNOSIS — R2681 Unsteadiness on feet: Secondary | ICD-10-CM | POA: Diagnosis not present

## 2023-09-10 DIAGNOSIS — M6281 Muscle weakness (generalized): Secondary | ICD-10-CM | POA: Diagnosis not present

## 2023-09-10 DIAGNOSIS — L821 Other seborrheic keratosis: Secondary | ICD-10-CM | POA: Diagnosis not present

## 2023-09-10 DIAGNOSIS — L853 Xerosis cutis: Secondary | ICD-10-CM | POA: Diagnosis not present

## 2023-09-10 DIAGNOSIS — L814 Other melanin hyperpigmentation: Secondary | ICD-10-CM | POA: Diagnosis not present

## 2023-09-10 DIAGNOSIS — L905 Scar conditions and fibrosis of skin: Secondary | ICD-10-CM | POA: Diagnosis not present

## 2023-09-12 DIAGNOSIS — R2681 Unsteadiness on feet: Secondary | ICD-10-CM | POA: Diagnosis not present

## 2023-09-12 DIAGNOSIS — M6281 Muscle weakness (generalized): Secondary | ICD-10-CM | POA: Diagnosis not present

## 2023-09-17 DIAGNOSIS — M6281 Muscle weakness (generalized): Secondary | ICD-10-CM | POA: Diagnosis not present

## 2023-09-17 DIAGNOSIS — R2681 Unsteadiness on feet: Secondary | ICD-10-CM | POA: Diagnosis not present

## 2023-09-19 DIAGNOSIS — R2681 Unsteadiness on feet: Secondary | ICD-10-CM | POA: Diagnosis not present

## 2023-09-19 DIAGNOSIS — M6281 Muscle weakness (generalized): Secondary | ICD-10-CM | POA: Diagnosis not present

## 2023-09-24 DIAGNOSIS — F411 Generalized anxiety disorder: Secondary | ICD-10-CM | POA: Diagnosis not present

## 2023-09-24 DIAGNOSIS — M6281 Muscle weakness (generalized): Secondary | ICD-10-CM | POA: Diagnosis not present

## 2023-09-24 DIAGNOSIS — F330 Major depressive disorder, recurrent, mild: Secondary | ICD-10-CM | POA: Diagnosis not present

## 2023-09-24 DIAGNOSIS — R2681 Unsteadiness on feet: Secondary | ICD-10-CM | POA: Diagnosis not present

## 2023-09-26 DIAGNOSIS — R2681 Unsteadiness on feet: Secondary | ICD-10-CM | POA: Diagnosis not present

## 2023-09-26 DIAGNOSIS — M6281 Muscle weakness (generalized): Secondary | ICD-10-CM | POA: Diagnosis not present

## 2023-10-01 DIAGNOSIS — M6281 Muscle weakness (generalized): Secondary | ICD-10-CM | POA: Diagnosis not present

## 2023-10-01 DIAGNOSIS — R2681 Unsteadiness on feet: Secondary | ICD-10-CM | POA: Diagnosis not present

## 2023-10-03 DIAGNOSIS — M6281 Muscle weakness (generalized): Secondary | ICD-10-CM | POA: Diagnosis not present

## 2023-10-03 DIAGNOSIS — R2681 Unsteadiness on feet: Secondary | ICD-10-CM | POA: Diagnosis not present

## 2023-10-08 DIAGNOSIS — M6281 Muscle weakness (generalized): Secondary | ICD-10-CM | POA: Diagnosis not present

## 2023-10-08 DIAGNOSIS — R2681 Unsteadiness on feet: Secondary | ICD-10-CM | POA: Diagnosis not present

## 2023-10-10 DIAGNOSIS — M6281 Muscle weakness (generalized): Secondary | ICD-10-CM | POA: Diagnosis not present

## 2023-10-10 DIAGNOSIS — R2681 Unsteadiness on feet: Secondary | ICD-10-CM | POA: Diagnosis not present

## 2023-10-14 ENCOUNTER — Encounter: Payer: Self-pay | Admitting: Nurse Practitioner

## 2023-10-14 NOTE — Progress Notes (Signed)
 This encounter was created in error - please disregard.

## 2023-10-15 ENCOUNTER — Encounter: Payer: Self-pay | Admitting: Nurse Practitioner

## 2023-10-15 ENCOUNTER — Non-Acute Institutional Stay: Payer: Self-pay | Admitting: Nurse Practitioner

## 2023-10-15 DIAGNOSIS — K219 Gastro-esophageal reflux disease without esophagitis: Secondary | ICD-10-CM

## 2023-10-15 DIAGNOSIS — I10 Essential (primary) hypertension: Secondary | ICD-10-CM | POA: Diagnosis not present

## 2023-10-15 DIAGNOSIS — M159 Polyosteoarthritis, unspecified: Secondary | ICD-10-CM

## 2023-10-15 DIAGNOSIS — M6281 Muscle weakness (generalized): Secondary | ICD-10-CM | POA: Diagnosis not present

## 2023-10-15 DIAGNOSIS — M810 Age-related osteoporosis without current pathological fracture: Secondary | ICD-10-CM

## 2023-10-15 DIAGNOSIS — R053 Chronic cough: Secondary | ICD-10-CM | POA: Diagnosis not present

## 2023-10-15 DIAGNOSIS — R1310 Dysphagia, unspecified: Secondary | ICD-10-CM | POA: Diagnosis not present

## 2023-10-15 DIAGNOSIS — F418 Other specified anxiety disorders: Secondary | ICD-10-CM

## 2023-10-15 DIAGNOSIS — I639 Cerebral infarction, unspecified: Secondary | ICD-10-CM | POA: Diagnosis not present

## 2023-10-15 DIAGNOSIS — R2681 Unsteadiness on feet: Secondary | ICD-10-CM | POA: Diagnosis not present

## 2023-10-15 DIAGNOSIS — E785 Hyperlipidemia, unspecified: Secondary | ICD-10-CM | POA: Diagnosis not present

## 2023-10-15 DIAGNOSIS — R269 Unspecified abnormalities of gait and mobility: Secondary | ICD-10-CM

## 2023-10-15 NOTE — Assessment & Plan Note (Signed)
 Doing well, no choking episode recently, sometimes coughs when swallowing, worked with ST.

## 2023-10-15 NOTE — Assessment & Plan Note (Signed)
 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 Takes Plavix . Atorvastatin . affected gait(balance issues and vision issues(seeing Ophthalmology), feels weak left leg even if muscle strength 5/5

## 2023-10-15 NOTE — Assessment & Plan Note (Signed)
 07/19/23 X-ray L knee: no acute bony fx, or joint subluxation, or dislocation.  C/o left knee pain, she attributed it to her falling, Ortho to eval and tx.

## 2023-10-15 NOTE — Progress Notes (Signed)
 This encounter was created in error - please disregard.

## 2023-10-15 NOTE — Assessment & Plan Note (Signed)
 Blood pressure is controlled,  takes Amlodipine , Lisinopril , off Metoprolol  2/2 HR 50s and c/o daytime sleepiness. Bun/creat 16/0.8 07/23/23

## 2023-10-15 NOTE — Assessment & Plan Note (Signed)
 takes Fosamax , Ca, Vit D, t score -2.3 10/25/20, 06/13/23 DEXA t score -1.8

## 2023-10-15 NOTE — Assessment & Plan Note (Signed)
 CTA 01/27/22 showed multiple subsegmental pulmonary emboli and pericardial effusion identified, on Eliquis , Plavix , f/u Cardiology. EF 65-70% 01/27/22. Venous US  BLE no DVT.

## 2023-10-15 NOTE — Assessment & Plan Note (Signed)
Chronic occasional dry cough for about a year, left side throat tickles sometimes during eating, then cough, ? Resultant of CVA.

## 2023-10-15 NOTE — Assessment & Plan Note (Signed)
 takes Atorvastatin , LDL 61 07/23/23

## 2023-10-15 NOTE — Assessment & Plan Note (Signed)
 The patient stated that she has been depressed, but also reported she sleeps and eats well, she has noted out of her room to participate in activities on the unit, takes Citalopram , Wellbutrin ,  f/u psycho therapy, TSH 2.66 07/23/23

## 2023-10-15 NOTE — Assessment & Plan Note (Addendum)
 Left leg weakness,  risk of  falling, power w/c for mobility.   fall 10/13/2023 when the patient was a found lying on her right side next to her recliner in her room, the patient stated she lost balance and attempting to transfer from her wheelchair to her recliner, no apparent injury noted. Close supervision and assistance needed for safety

## 2023-10-15 NOTE — Progress Notes (Signed)
 Location:  [facility] Room Number: 904 A Place of Service:  ALF (13) Provider:  ManXie Amairani Shuey, N.P.   Patient Care Team: Sherlynn Madden, MD as PCP - General (Internal Medicine) Lonni Slain, MD as PCP - Cardiology (Cardiology) Taheera Thomann X, NP as Nurse Practitioner (Internal Medicine) Debarah Lorrene DEL., MD (Ophthalmology) Joshua Sieving, MD as Consulting Physician (Dermatology) Joshua Sieving, MD as Consulting Physician (Dermatology) Darina Barrio, MD as Consulting Physician (Gynecology)  Extended Emergency Contact Information Primary Emergency Contact: Manuelita Mliss [employer]  [place] Mobile Phone: [phone] Relation: Daughter Secondary Emergency Contact: Black,Joel Mobile Phone: [phone] Relation: Son  Code Status:  DNR Goals of care: Advanced Directive information    08/26/2023   12:15 PM  Advanced Directives  Does Patient Have a Medical Advance Directive? Yes  Type of Estate agent of [place];Living will;Out of facility DNR (pink MOST or yellow form)  Does patient want to make changes to medical advance directive? No - Patient declined  Copy of Healthcare Power of Attorney in Chart? Yes - validated most recent copy scanned in chart (See row information)     Chief Complaint  Patient presents with   Fall    Recent fall and patient on blood thinner     HPI:  Pt is a 89 y.o. female seen today for an acute visit for fall 10/13/2023 when the patient was a found lying on her right side next to her recliner in her room, the patient stated she lost balance and attempting to transfer from her wheelchair to her recliner, no apparent injury noted.   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 Takes Plavix . Atorvastatin . affected gait(balance issues and vision issues(seeing Ophthalmology), feels weak left leg even if muscle strength 5/5             Bradycardia,  normalized after dc'd Metoprolol , followed by Cardiology              CTA 01/27/22 showed multiple subsegmental pulmonary emboli and pericardial effusion identified, on Eliquis , Plavix , f/u Cardiology. EF 65-70% 01/27/22. Venous US  BLE no DVT.              HTN, takes Amlodipine , Lisinopril , off Metoprolol  2/2 HR 50s and c/o daytime sleepiness. Bun/creat 16/0.8 07/23/23             Constipation, takes Colace                               GERD, takes Pantoprazole ,  Hgb 12 07/23/23             Dysphagia, sometimes coughs when swallowing, worked with ST.              Gait abnormality, risk of  falling, power w/c for mobility.              Chronic occasional dry cough for about a year, left side throat tickles sometimes during eating, then cough, ? Resultant of CVA.              OP, takes Fosamax , Ca, Vit D, t score -2.3 10/25/20, 06/13/23 DEXA t score -1.8             Depression, takes Citalopram , Wellbutrin ,  f/u psycho therapy, TSH 2.66 07/23/23             Pericardial effusion, Hx of it, present again on CTA 01/27/22  Hyperlipidemia, takes Atorvastatin , LDL 61 07/23/23             OA pain, power w/c             Urinary incontinence, progressing             Hx of skin candidiasis, under breast, arms, Triamcinolone and Nystatin cream effective.       Past Medical History:  Diagnosis Date   Anginal pain (HCC) 08/24/2010   Echo-EF =>55%,LV normal   Arthritis    Cataract 12/2006   Both eyes  Epes MD   Chest pain, unspecified 09/04/2007   Lexiscan-- EF 72%; LV normal   Constipation    Takes flax seed and honey .  Smooth Move tea.   Depression    Diverticulitis    Head injury, closed    with fall   History of cardiac cath 2012   normal coronary arteries   Hypertension    Mitral valve prolapse    Osteopenia    Stroke (HCC) 09/02/2019   Past Surgical History:  Procedure Laterality Date   ANTERIOR CERVICAL DECOMP/DISCECTOMY FUSION N/A 10/23/2012   Procedure: Cervical Five to  Cervical Six, Cervical Six to Cervical Seven anterior cervical decompression with fusion plating and bonegraft;  Surgeon: Lamar LELON Peaches, MD;  Location: [facility];  Service: Neurosurgery;  Laterality: N/A;  ANTERIOR CERVICAL DECOMPRESSION/DISCECTOMY FUSION 2 LEVELS   BREAST CYST ASPIRATION  1990   benign   CARDIAC CATHETERIZATION  08/01/2010   normal coronaries   COLONOSCOPY  approx 2011   EYE SURGERY Bilateral 2011   intestinal blockage surgery  Feb. 2012   kink in small intestine per pt    Allergies  Allergen Reactions   Contrast Media [Iodinated Contrast Media] Hives    Hives during IVP at urology office '02, requires 13 hr prep///[email or web address]  Patient came thru ER and had a 1 hour pre-medication and did fine   Oxycontin  [Oxycodone ] Other (See Comments)    Decreased appetite.   Boniva [Ibandronate] Nausea And Vomiting and Other (See Comments)    Weakness    Codeine Diarrhea and Nausea And Vomiting   Darvocet [Propoxyphene N-Acetaminophen ] Diarrhea and Nausea And Vomiting   Erythromycin Nausea And Vomiting    Outpatient Encounter Medications as of 10/15/2023  Medication Sig   acetaminophen  (TYLENOL ) 325 MG tablet Take 650 mg by mouth at bedtime. 2 entries: 650 mg every 6 hours as needed   acetaminophen  (TYLENOL ) 325 MG tablet Take 650 mg by mouth every 6 (six) hours as needed.   alendronate  (FOSAMAX ) 70 MG tablet TAKE 1 TAB ONCE A WEEK, AT LEAST 30 MIN BEFORE 1ST FOOD.DO NOT LIE DOWN FOR 30 MIN AFTER TAKING.   amLODipine  (NORVASC ) 5 MG tablet Take 5 mg by mouth in the morning.   apixaban  (ELIQUIS ) 5 MG TABS tablet Take 2.5 mg by mouth 2 (two) times daily.   atorvastatin  (LIPITOR) 20 MG tablet TAKE ONE TABLET BY MOUTH ONCE DAILY.   buPROPion  (WELLBUTRIN  XL) 300 MG 24 hr tablet Take 300 mg by mouth daily.   Calcium  Carbonate-Vitamin D  (CALCIUM  600 +D HIGH POTENCY) 600-10 MG-MCG TABS Take 1 tablet by mouth in the morning.   carboxymethylcellulose (REFRESH PLUS) 0.5 % SOLN  Place 1 drop into both eyes 2 (two) times daily.   citalopram  (CELEXA ) 20 MG tablet Take 1 tablet (20 mg total) by mouth daily.   clopidogrel  (PLAVIX ) 75 MG tablet TAKE 1 TABLET ONCE DAILY.   docusate sodium  (COLACE) 100  MG capsule Take 100 mg by mouth at bedtime.   lisinopril  (ZESTRIL ) 10 MG tablet Take 10 mg by mouth daily.   loratadine (CLARITIN) 10 MG tablet Take 10 mg by mouth daily.   nystatin cream (MYCOSTATIN) Apply to under breasts (rash) topically every 12 hours as needed for moisture associated rash Apply to under breasts (rash) topically two times a day for moisture associated rash for 2 Weeks   pantoprazole  (PROTONIX ) 40 MG tablet TAKE 1 TABLET ONCE DAILY.   triamcinolone cream (KENALOG) 0.1 % Apply to under breast (rash) topically every 12 hours as needed for moisture associated rash Apply to under breasts (rash) topically two times a day for moisture associated rash for 2 Weeks   No facility-administered encounter medications on file as of 10/15/2023.    Review of Systems  Constitutional:  Negative for appetite change, fatigue and fever.  HENT:  Positive for hearing loss and rhinorrhea. Negative for congestion, sinus pressure, sinus pain, sore throat, trouble swallowing and voice change.   Eyes:  Negative for visual disturbance.  Respiratory:  Positive for cough. Negative for chest tightness, shortness of breath and wheezing.        Chronic occasional cough with clear phlegm. DOE since 01/27/22  Cardiovascular:  Negative for leg swelling.  Gastrointestinal:  Negative for abdominal pain and constipation.  Genitourinary:  Negative for dysuria, frequency and urgency.       Occasional urinary leakage   Musculoskeletal:  Positive for arthralgias, back pain and gait problem.  Skin:  Negative for rash.  Neurological:  Positive for weakness. Negative for speech difficulty and headaches.       Memory lapses occasionally. Left sided weakness.   Psychiatric/Behavioral:  Negative for  behavioral problems and sleep disturbance. The patient is not nervous/anxious.     Immunization History  Administered Date(s) Administered   Fluad Quad(high Dose 65+) 12/17/2018   Influenza, High Dose Seasonal PF 12/08/2016, 12/19/2020   Influenza-Unspecified 12/16/2019, 12/25/2021, 12/05/2022   Moderna Sars-Covid-2 Vaccination 03/09/2019, 04/06/2019, 01/12/2020, 12/19/2022   Pfizer Covid-19 Vaccine Bivalent Booster 51yrs & up 01/04/2022   Pneumococcal Conjugate-13 06/12/2013   Pneumococcal Polysaccharide-23 07/28/2002   RSV,unspecified 04/05/2022   Tdap 07/27/2009, 04/03/2023   Zoster Recombinant(Shingrix) 01/08/2017, 06/03/2017   Pertinent  Health Maintenance Due  Topic Date Due   INFLUENZA VACCINE  11/04/2023 (Originally 10/04/2023)   DEXA SCAN  Completed      01/27/2022   10:25 AM 01/28/2022    8:15 AM 03/13/2022    9:11 AM 03/16/2022    2:03 PM 08/22/2022    9:06 AM  Fall Risk  Falls in the past year?   0 0 0  Was there an injury with Fall?   0 0 0  Fall Risk Category Calculator   0 0 0  Fall Risk Category (Retired)   Low  Low    (RETIRED) Patient Fall Risk Level Low fall risk  Moderate fall risk  Low fall risk  Low fall risk    Patient at Risk for Falls Due to   No Fall Risks No Fall Risks No Fall Risks  Fall risk Follow up   Falls evaluation completed  Falls evaluation completed       Data saved with a previous flowsheet row definition   Functional Status Survey:    Vitals:   10/15/23 0925  BP: 118/76  Pulse: 67  SpO2: 96%  Weight: 144 lb 3.2 oz (65.4 kg)  Height: 5' 6 (1.676 m)   Body mass index  is 23.27 kg/m. Physical Exam Vitals and nursing note reviewed.  Constitutional:      Appearance: Normal appearance.  HENT:     Head: Normocephalic and atraumatic.     Nose: Nose normal.     Mouth/Throat:     Mouth: Mucous membranes are moist.  Eyes:     Extraocular Movements: Extraocular movements intact.     Conjunctiva/sclera: Conjunctivae normal.      Pupils: Pupils are equal, round, and reactive to light.     Comments: Lateral right eye peripheral vision field loss since CVA 11/2018.  Cardiovascular:     Rate and Rhythm: Normal rate and regular rhythm.     Heart sounds: No murmur heard. Pulmonary:     Effort: Pulmonary effort is normal.     Breath sounds: No rales.  Abdominal:     General: Bowel sounds are normal.     Palpations: Abdomen is soft.     Tenderness: There is no abdominal tenderness.  Musculoskeletal:        General: Tenderness present.     Cervical back: Normal range of motion and neck supple.     Right lower leg: No edema.     Left lower leg: No edema.     Comments: Kyphoscoliosis. Left shoulder over head ROM decreased.  C/o left knee pain, weakness  Skin:    General: Skin is warm and dry.     Findings: No rash.     Comments:    Neurological:     Mental Status: She is alert and oriented to person, place, and time. Mental status is at baseline.     Motor: Weakness present.     Coordination: Coordination normal.     Gait: Gait abnormal.     Deep Tendon Reflexes: Reflexes normal.     Comments: Left sided weakness with muscle strength 5/5. W/c for mobility   Psychiatric:        Mood and Affect: Mood normal.        Behavior: Behavior normal.     Labs reviewed: Recent Labs    03/05/23 0000 07/23/23 0000  NA 138 141  K 3.6 3.8  CL 103 106  CO2 25* 27*  BUN 24* 16  CREATININE 1.0 0.8  CALCIUM  9.2 9.7   Recent Labs    03/05/23 0000 07/23/23 0000  AST 25 19  ALT 19 13  ALKPHOS 43 60  ALBUMIN 3.8 3.9   Recent Labs    03/05/23 0000 07/23/23 0000  WBC 6.1 5.6  NEUTROABS 4,477.00 3,052.00  HGB 12.1 12.0  HCT 37 37  PLT 229 277   Lab Results  Component Value Date   TSH 2.66 07/23/2023   Lab Results  Component Value Date   HGBA1C 5.3 09/03/2019   Lab Results  Component Value Date   CHOL 124 07/23/2023   HDL 52 07/23/2023   LDLCALC 61 07/23/2023   TRIG 38 (A) 07/23/2023   CHOLHDL 2.7  10/19/2021    Significant Diagnostic Results in last 30 days:  No results found.  Assessment/Plan Gait abnormality Left leg weakness,  risk of  falling, power w/c for mobility.   fall 10/13/2023 when the patient was a found lying on her right side next to her recliner in her room, the patient stated she lost balance and attempting to transfer from her wheelchair to her recliner, no apparent injury noted. Close supervision and assistance needed for safety  Ischemic stroke [facility])  CVA 2nd to thrombosis of right posterior  cerebral artery, Hx of CVA left occipital cortex 9/20. Takes Plavix . Atorvastatin . affected gait(balance issues and vision issues(seeing Ophthalmology), feels weak left leg even if muscle strength 5/5  Hypertension Blood pressure is controlled,  takes Amlodipine , Lisinopril , off Metoprolol  2/2 HR 50s and c/o daytime sleepiness. Bun/creat 16/0.8 07/23/23  GERD (gastroesophageal reflux disease) Stable, takes Pantoprazole ,  Hgb 12 07/23/23  Dysphagia Doing well, no choking episode recently, sometimes coughs when swallowing, worked with ST.   Cough Chronic occasional dry cough for about a year, left side throat tickles sometimes during eating, then cough, ? Resultant of CVA.   Osteoporosis takes Fosamax , Ca, Vit D, t score -2.3 10/25/20, 06/13/23 DEXA t score -1.8  Depression with anxiety The patient stated that she has been depressed, but also reported she sleeps and eats well, she has noted out of her room to participate in activities on the unit, takes Citalopram , Wellbutrin ,  f/u psycho therapy, TSH 2.66 07/23/23  Hyperlipidemia takes Atorvastatin , LDL 61 07/23/23  Pulmonary embolus (HCC) CTA 01/27/22 showed multiple subsegmental pulmonary emboli and pericardial effusion identified, on Eliquis , Plavix , f/u Cardiology. EF 65-70% 01/27/22. Venous US  BLE no DVT.   Generalized osteoarthritis of multiple sites 07/19/23 X-ray L knee: no acute bony fx, or joint subluxation, or  dislocation.  C/o left knee pain, she attributed it to her falling, Ortho to eval and tx.     Family/ staff Communication: Plan of care reviewed with the patient and charge nurse  Labs/tests ordered: None

## 2023-10-15 NOTE — Assessment & Plan Note (Signed)
 Stable, takes Pantoprazole ,  Hgb 12 07/23/23

## 2023-10-22 DIAGNOSIS — F411 Generalized anxiety disorder: Secondary | ICD-10-CM | POA: Diagnosis not present

## 2023-10-22 DIAGNOSIS — F330 Major depressive disorder, recurrent, mild: Secondary | ICD-10-CM | POA: Diagnosis not present

## 2023-10-25 DIAGNOSIS — M25562 Pain in left knee: Secondary | ICD-10-CM | POA: Diagnosis not present

## 2023-11-05 DIAGNOSIS — M25562 Pain in left knee: Secondary | ICD-10-CM | POA: Diagnosis not present

## 2023-11-25 ENCOUNTER — Non-Acute Institutional Stay: Payer: Self-pay | Admitting: Sports Medicine

## 2023-11-25 ENCOUNTER — Encounter: Payer: Self-pay | Admitting: Sports Medicine

## 2023-11-25 DIAGNOSIS — K219 Gastro-esophageal reflux disease without esophagitis: Secondary | ICD-10-CM

## 2023-11-25 DIAGNOSIS — I10 Essential (primary) hypertension: Secondary | ICD-10-CM

## 2023-11-25 DIAGNOSIS — I2782 Chronic pulmonary embolism: Secondary | ICD-10-CM | POA: Diagnosis not present

## 2023-11-25 DIAGNOSIS — M810 Age-related osteoporosis without current pathological fracture: Secondary | ICD-10-CM | POA: Diagnosis not present

## 2023-11-25 DIAGNOSIS — M1710 Unilateral primary osteoarthritis, unspecified knee: Secondary | ICD-10-CM

## 2023-11-25 DIAGNOSIS — F32A Depression, unspecified: Secondary | ICD-10-CM | POA: Diagnosis not present

## 2023-11-25 NOTE — Progress Notes (Signed)
 Provider:  Dr. Jackalyn Blazing Location:  [facility] Place of Service:   Assisted living   PCP: Blazing Jackalyn, MD Patient Care Team: Blazing Jackalyn, MD as PCP - General (Internal Medicine) Lonni Slain, MD as PCP - Cardiology (Cardiology) [name], [name], NP as Nurse Practitioner (Internal Medicine) Debarah Lorrene DEL., MD (Ophthalmology) Joshua Sieving, MD as Consulting Physician (Dermatology) Joshua Sieving, MD as Consulting Physician (Dermatology) Darina Barrio, MD as Consulting Physician (Gynecology)  Extended Emergency Contact Information Primary Emergency Contact: Manuelita Clarity  [place] Mobile Phone: [phone] Relation: Daughter Secondary Emergency Contact: Black,Joel Mobile Phone: [phone] Relation: Son  Goals of Care: Advanced Directive information    08/26/2023   12:15 PM  Advanced Directives  Does Patient Have a Medical Advance Directive? Yes  Type of Estate agent of [place];Living will;Out of facility DNR (pink MOST or yellow form)  Does patient want to make changes to medical advance directive? No - Patient declined  Copy of Healthcare Power of Attorney in Chart? Yes - validated most recent copy scanned in chart (See row information)       History of Present Illness   89 yr old F with h/o CVA,GERD, OA, Osteoporosis, Depression, HLD, Urinary incontinence is seen today for chronic disease management. Pt seen and examined in her room, she is sitting in wheel chair. Seems pleasant and comfortable and does not appear to be in distress. Pt c/o chronic pain in her left knee. She followed with orthopedics. Pt received knee injection in  August.  Currently on tylenol  , completed physical therapy.  Pt denies chest pain, palpitations, SOB, cough, abdominal pain, nausea, vomiting, dysuria, hematuria, bloody or dark stools.  11/04/2023 13:51 144.4 Lbs (Standing) 10/20/2023 21:01 144.2 Lbs  (Standing) 10/02/2023 23:43 144.2 Lbs (Standing) 09/03/2023 23:16 145 Lbs (Standing) 08/09/2023 14:45 147.6 Lbs (Standing) 07/04/2023 23:21 144.8 Lbs (Sitting) 06/05/2023 00:01 145.4 Lbs (Sitting) 05/05/2023 01:47 148.6 Lbs (Standing    Past Medical History:  Diagnosis Date   Anginal pain (HCC) 08/24/2010   Echo-EF =>55%,LV normal   Arthritis    Cataract 12/2006   Both eyes  Epes MD   Chest pain, unspecified 09/04/2007   Lexiscan-- EF 72%; LV normal   Constipation    Takes flax seed and honey .  Smooth Move tea.   Depression    Diverticulitis    Head injury, closed    with fall   History of cardiac cath 2012   normal coronary arteries   Hypertension    Mitral valve prolapse    Osteopenia    Stroke (HCC) 09/02/2019   Past Surgical History:  Procedure Laterality Date   ANTERIOR CERVICAL DECOMP/DISCECTOMY FUSION N/A 10/23/2012   Procedure: Cervical Five to Cervical Six, Cervical Six to Cervical Seven anterior cervical decompression with fusion plating and bonegraft;  Surgeon: Lamar LELON Peaches, MD;  Location: [facility];  Service: Neurosurgery;  Laterality: N/A;  ANTERIOR CERVICAL DECOMPRESSION/DISCECTOMY FUSION 2 LEVELS   BREAST CYST ASPIRATION  1990   benign   CARDIAC CATHETERIZATION  08/01/2010   normal coronaries   COLONOSCOPY  approx 2011   EYE SURGERY Bilateral 2011   intestinal blockage surgery  Feb. 2012   kink in small intestine per pt    reports that she has never smoked. She has never used smokeless tobacco. She reports that she does not currently use alcohol after a past usage of about 4.0 standard drinks of alcohol per week. She reports that she does not use drugs. Social  History   Socioeconomic History   Marital status: Widowed    Spouse name: Joe   Number of children: 2   Years of education: college   Highest education level: Bachelor's degree (e.g., BA, AB, BS)  Occupational History    Comment: retired Runner, broadcasting/film/video  Tobacco Use   Smoking status:  Never   Smokeless tobacco: Never  Vaping Use   Vaping status: Never Used  Substance and Sexual Activity   Alcohol use: Not Currently    Alcohol/week: 4.0 standard drinks of alcohol    Types: 2 Glasses of wine, 2 Cans of beer per week    Comment: occasionally   Drug use: No   Sexual activity: Yes  Other Topics Concern   Not on file  Social History Narrative   Diet:  Normal   Do you drink/eat things with caffeine?   Yes   Marital status:  Widow                            What year were you married? 1986 and 1960   Do you live in a house, apartment, assisted living, condo, trailer, etc)?  Apartment at [facility], independent living   Is it one or more stories? 1+   How many persons live in your home?  1   Do you have any pets in your home?  No   Current or past profession:  Runner, broadcasting/film/video, Environmental health practitioner at [employer] you exercise?   Yes                                                 Type & how often: Stretching,  Strengthening   Do you have a living will?  Yes   Do you have a DNR Form?   Do you have a POA/HPOA forms?  HPOA   Social Drivers of Corporate investment banker Strain: Low Risk  (09/03/2017)   Overall Financial Resource Strain (CARDIA)    Difficulty of Paying Living Expenses: Not hard at all  Food Insecurity: No Food Insecurity (09/03/2017)   Hunger Vital Sign    Worried About Running Out of Food in the Last Year: Never true    Ran Out of Food in the Last Year: Never true  Transportation Needs: No Transportation Needs (09/03/2017)   PRAPARE - Administrator, Civil Service (Medical): No    Lack of Transportation (Non-Medical): No  Physical Activity: Sufficiently Active (09/03/2017)   Exercise Vital Sign    Days of Exercise per Week: 7 days    Minutes of Exercise per Session: 40 min  Stress: Stress Concern Present (09/03/2017)   [employer] of Occupational Health - Occupational Stress Questionnaire    Feeling of Stress : To some extent  Social  Connections: Somewhat Isolated (09/03/2017)   Social Connection and Isolation Panel    Frequency of Communication with Friends and Family: More than three times a week    Frequency of Social Gatherings with Friends and Family: More than three times a week    Attends Religious Services: Never    Database administrator or Organizations: No    Attends Banker Meetings: Never    Marital Status: Married  Catering manager Violence: Not At Risk (09/03/2017)   Humiliation, Afraid, Rape, and  Kick questionnaire    Fear of Current or Ex-Partner: No    Emotionally Abused: No    Physically Abused: No    Sexually Abused: No    Functional Status Survey:    Family History  Problem Relation Age of Onset   Hypertension Mother    Transient ischemic attack Mother    Parkinson's disease Mother    Dementia Mother    CVA Mother    Diabetes Father    Coronary artery disease Father    Heart failure Father    Cancer Father        prostate   Depression Father    Congestive Heart Failure Father    Prostate cancer Brother    Diabetes Brother    Prostate cancer Paternal Grandmother     Health Maintenance  Topic Date Due   Influenza Vaccine  10/04/2023   COVID-19 Vaccine (6 - 2025-26 season) 11/04/2023   Medicare Annual Wellness (AWV)  03/27/2024   DTaP/Tdap/Td (3 - Td or Tdap) 04/02/2033   Pneumococcal Vaccine: 50+ Years  Completed   DEXA SCAN  Completed   Zoster Vaccines- Shingrix  Completed   HPV VACCINES  Aged Out   Meningococcal B Vaccine  Aged Out    Allergies  Allergen Reactions   Contrast Media [Iodinated Contrast Media] Hives    Hives during IVP at urology office '02, requires 13 hr prep///a.[email or web address]  Patient came thru ER and had a 1 hour pre-medication and did fine   Oxycontin  [Oxycodone ] Other (See Comments)    Decreased appetite.   Boniva [Ibandronate] Nausea And Vomiting and Other (See Comments)    Weakness    Codeine Diarrhea and Nausea And Vomiting   Darvocet  [Propoxyphene N-Acetaminophen ] Diarrhea and Nausea And Vomiting   Erythromycin Nausea And Vomiting    Outpatient Encounter Medications as of 11/25/2023  Medication Sig   acetaminophen  (TYLENOL ) 325 MG tablet Take 650 mg by mouth at bedtime. 2 entries: 650 mg every 6 hours as needed   acetaminophen  (TYLENOL ) 325 MG tablet Take 650 mg by mouth every 6 (six) hours as needed.   alendronate  (FOSAMAX ) 70 MG tablet TAKE 1 TAB ONCE A WEEK, AT LEAST 30 MIN BEFORE 1ST FOOD.DO NOT LIE DOWN FOR 30 MIN AFTER TAKING.   amLODipine  (NORVASC ) 5 MG tablet Take 5 mg by mouth in the morning.   apixaban  (ELIQUIS ) 5 MG TABS tablet Take 2.5 mg by mouth 2 (two) times daily.   atorvastatin  (LIPITOR) 20 MG tablet TAKE ONE TABLET BY MOUTH ONCE DAILY.   buPROPion  (WELLBUTRIN  XL) 300 MG 24 hr tablet Take 300 mg by mouth daily.   Calcium  Carbonate-Vitamin D  (CALCIUM  600 +D HIGH POTENCY) 600-10 MG-MCG TABS Take 1 tablet by mouth in the morning.   carboxymethylcellulose (REFRESH PLUS) 0.5 % SOLN Place 1 drop into both eyes 2 (two) times daily.   citalopram  (CELEXA ) 20 MG tablet Take 1 tablet (20 mg total) by mouth daily.   clopidogrel  (PLAVIX ) 75 MG tablet TAKE 1 TABLET ONCE DAILY.   docusate sodium  (COLACE) 100 MG capsule Take 100 mg by mouth at bedtime.   lisinopril  (ZESTRIL ) 10 MG tablet Take 10 mg by mouth daily.   loratadine (CLARITIN) 10 MG tablet Take 10 mg by mouth daily.   nystatin cream (MYCOSTATIN) Apply to under breasts (rash) topically every 12 hours as needed for moisture associated rash Apply to under breasts (rash) topically two times a day for moisture associated rash for 2 Weeks   pantoprazole  (PROTONIX ) 40  MG tablet TAKE 1 TABLET ONCE DAILY.   triamcinolone cream (KENALOG) 0.1 % Apply to under breast (rash) topically every 12 hours as needed for moisture associated rash Apply to under breasts (rash) topically two times a day for moisture associated rash for 2 Weeks   No facility-administered encounter  medications on file as of 11/25/2023.    Review of Systems  Constitutional:  Negative for fever.  HENT:  Negative for sore throat.   Respiratory:  Negative for cough, shortness of breath and wheezing.   Cardiovascular:  Negative for chest pain, palpitations and leg swelling.  Gastrointestinal:  Negative for abdominal pain, blood in stool, constipation, diarrhea, nausea and vomiting.  Genitourinary:  Negative for dysuria.  Musculoskeletal:  Positive for arthralgias.  Neurological:  Negative for dizziness.   Negative unless indicated in HPI.  There were no vitals filed for this visit. There is no height or weight on file to calculate BMI. BP Readings from Last 3 Encounters:  10/15/23 118/76  08/26/23 114/72  07/19/23 116/76   Wt Readings from Last 3 Encounters:  10/15/23 144 lb 3.2 oz (65.4 kg)  08/26/23 147 lb 9.6 oz (67 kg)  07/19/23 144 lb 12.8 oz (65.7 kg)   Physical Exam Constitutional:      Appearance: Normal appearance.  HENT:     Head: Normocephalic and atraumatic.  Cardiovascular:     Rate and Rhythm: Normal rate and regular rhythm.  Pulmonary:     Effort: Pulmonary effort is normal. No respiratory distress.     Breath sounds: Normal breath sounds. No wheezing.  Abdominal:     General: Bowel sounds are normal. There is no distension.     Tenderness: There is no abdominal tenderness. There is no guarding or rebound.     Comments:    Musculoskeletal:        General: No swelling.  Neurological:     Mental Status: She is alert. Mental status is at baseline.     Motor: No weakness.     Labs reviewed: Basic Metabolic Panel: Recent Labs    03/05/23 0000 07/23/23 0000  NA 138 141  K 3.6 3.8  CL 103 106  CO2 25* 27*  BUN 24* 16  CREATININE 1.0 0.8  CALCIUM  9.2 9.7   Liver Function Tests: Recent Labs    03/05/23 0000 07/23/23 0000  AST 25 19  ALT 19 13  ALKPHOS 43 60  ALBUMIN 3.8 3.9   No results for input(s): LIPASE, AMYLASE in the last 8760  hours. No results for input(s): AMMONIA in the last 8760 hours. CBC: Recent Labs    03/05/23 0000 07/23/23 0000  WBC 6.1 5.6  NEUTROABS 4,477.00 3,052.00  HGB 12.1 12.0  HCT 37 37  PLT 229 277   Cardiac Enzymes: No results for input(s): CKTOTAL, CKMB, CKMBINDEX, TROPONINI in the last 8760 hours. BNP: Invalid input(s): POCBNP Lab Results  Component Value Date   HGBA1C 5.3 09/03/2019   Lab Results  Component Value Date   TSH 2.66 07/23/2023   Lab Results  Component Value Date   VITAMINB12 186 07/23/2023   No results found for: FOLATE No results found for: IRON, TIBC, FERRITIN  Imaging and Procedures obtained prior to SNF admission: DG Bone Density Result Date: 06/17/2023 EXAM: DUAL X-RAY ABSORPTIOMETRY (DXA) FOR BONE MINERAL DENSITY 06/13/2023 2:12 pm CLINICAL DATA:  89 year old Female Postmenopausal. Screening for osteoporosis History of fragility fracture. History of hip and vertebral fracture. Patient is or has been on bone  building therapies. TECHNIQUE: An axial (e.g., hips, spine) and/or appendicular (e.g., radius) exam was performed, as appropriate, using GE Secretary/administrator at [employer]. Images are obtained for bone mineral density measurement and are not obtained for diagnostic purposes. [id number] Exclusions: Lumbar spine, bilateral hip COMPARISON:  None. New baseline. FINDINGS: Scan quality: Good. LEFT FOREARM (RADIUS 33%): BMD (in g/cm2): 0.729 T-score: -1.8 Z-score: 1.5 RIGHT FOREARM (RADIUS 33%): BMD (in g/cm2): 0.798 T-score: -1.0 Z-score: 2.3 FRAX 10-YEAR PROBABILITY OF FRACTURE: FRAX not reported as there is no evaluable hip. IMPRESSION: Osteopenia based on BMD. Fracture risk is unknown due to history of bone building therapy. RECOMMENDATIONS: 1. All patients should optimize calcium  and vitamin D  intake. 2. Consider FDA-approved medical therapies in postmenopausal women and men aged 61 years and older, based on the following: -  A hip or vertebral (clinical or morphometric) fracture - T-score less than or equal to -2.5 and secondary causes have been excluded. - Low bone mass (T-score between -1.0 and -2.5) and a 10-year probability of a hip fracture greater than or equal to 3% or a 10-year probability of a major osteoporosis-related fracture greater than or equal to 20% based on the [place] -adapted WHO algorithm. - Clinician judgment and/or patient preferences may indicate treatment for people with 10-year fracture probabilities above or below these levels 3. Patients with diagnosis of osteoporosis or at high risk for fracture should have regular bone mineral density tests. For patients eligible for Medicare, routine testing is allowed once every 2 years. The testing frequency can be increased to one year for patients who have rapidly progressing disease, those who are receiving or discontinuing medical therapy to restore bone mass, or have additional risk factors. Electronically Signed   By: Dina  Arceo M.D.   On: 06/17/2023 07:48       Latest Ref Rng & Units 07/23/2023   12:00 AM 03/05/2023   12:00 AM 08/23/2022   12:00 AM  CBC  WBC  5.6     6.1     5.0      Hemoglobin 12.0 - 16.0 12.0     12.1     13.3      Hematocrit 36 - 46 37     37     40      Platelets 150 - 400 K/uL 277     229     234         This result is from an external source.       Latest Ref Rng & Units 07/23/2023   12:00 AM 03/05/2023   12:00 AM 08/23/2022   12:00 AM  BMP  BUN 4 - 21 16     24     16       Creatinine 0.5 - 1.1 0.8     1.0     0.9      Sodium 137 - 147 141     138     140      Potassium 3.5 - 5.1 mEq/L 3.8     3.6     3.9      Chloride 99 - 108 106     103     103      CO2 13 - 22 27     25     31       Calcium  8.7 - 10.7 9.7     9.2     9.8         This result  is from an external source.    Assessment and Plan Assessment & Plan  OA knee No redness or swelling on exam C/o intermittent knee pain Cont with tylenol  Cont with home  exercise  Osteoporosis Cont with fosamax , cal, vit D   H/o PE  Cont with eliquis   HTN  Cont with lisinopril , amlodipine   Depression  Cont with celexa , wellbutrin    GERD Cont with protonix        Family/ staff Communication:   Labs/tests ordered:  Jackalyn Blazing

## 2023-11-26 DIAGNOSIS — F411 Generalized anxiety disorder: Secondary | ICD-10-CM | POA: Diagnosis not present

## 2023-11-26 DIAGNOSIS — F330 Major depressive disorder, recurrent, mild: Secondary | ICD-10-CM | POA: Diagnosis not present

## 2023-12-09 DIAGNOSIS — M6281 Muscle weakness (generalized): Secondary | ICD-10-CM | POA: Diagnosis not present

## 2023-12-09 DIAGNOSIS — R29898 Other symptoms and signs involving the musculoskeletal system: Secondary | ICD-10-CM | POA: Diagnosis not present

## 2023-12-11 ENCOUNTER — Ambulatory Visit: Admitting: Diagnostic Neuroimaging

## 2023-12-11 ENCOUNTER — Encounter: Payer: Self-pay | Admitting: Diagnostic Neuroimaging

## 2023-12-11 VITALS — BP 128/82 | HR 63 | Ht 66.0 in | Wt 143.0 lb

## 2023-12-11 DIAGNOSIS — R29898 Other symptoms and signs involving the musculoskeletal system: Secondary | ICD-10-CM

## 2023-12-11 DIAGNOSIS — E538 Deficiency of other specified B group vitamins: Secondary | ICD-10-CM | POA: Diagnosis not present

## 2023-12-11 DIAGNOSIS — K5730 Diverticulosis of large intestine without perforation or abscess without bleeding: Secondary | ICD-10-CM | POA: Insufficient documentation

## 2023-12-11 DIAGNOSIS — R531 Weakness: Secondary | ICD-10-CM

## 2023-12-11 NOTE — Progress Notes (Signed)
 [facility]  PATIENT: Adriana [name] DOB: [birth year]  REFERRING CLINICIAN: [name], [name], NP HISTORY FROM: patient  REASON FOR VISIT: new consult    HISTORICAL  CHIEF COMPLAINT:  Chief Complaint  Patient presents with   RM 7    L leg weakness    HISTORY OF PRESENT ILLNESS:   UPDATE (12/11/23, VRP): Since last visit, HERE FOR EVAL OF gradual and progressive left leg weakness. Denies any left leg or knee pain. Does have low back pain without radiation. Now using powerchair. No sudden onset of symptoms.   UPDATE (10/06/19, VRP): Since last visit, doing well until June 2021 stroke (right thalamic). Now at SNF, then ALF, and now going back to apartment tomorrow (at [facility]). Symptoms are improving.   PRIOR HPI (12/18/18): 89 year old female here for evaluation of stroke.  2 to 3 weeks ago patient had onset of blurred vision and loss of vision on the right side, mainly affecting her right eye.  Patient went to eye doctor for evaluation was noted to have right homonymous hemianopsia, right inferior quadrant.  Patient referred to the emergency room for evaluation.  MRI of the brain confirmed a subacute left occipital ischemic infarct.  Also noted to have left posterior cerebral artery P3 segment stenosis.  Patient was started on aspirin  81 mg daily and referred to neurology clinic.  Since that time symptoms are stable.  No numbness or weakness on the right side of her body.  She has noted some emotional lability and balance issues since this time.  She has had some left-sided headaches.  Patient has high blood pressure is on medical therapy.   REVIEW OF SYSTEMS: Full 14 system review of systems performed and negative with exception of: As per HPI.  ALLERGIES: Allergies  Allergen Reactions   Contrast Media [Iodinated Contrast Media] Hives    Hives during IVP at urology office '02, requires 13 hr prep///a.[email or web address]  Patient came thru ER and had a 1 hour pre-medication  and did fine   Oxycontin  [Oxycodone ] Other (See Comments)    Decreased appetite.   Boniva [Ibandronate] Nausea And Vomiting and Other (See Comments)    Weakness    Codeine Diarrhea and Nausea And Vomiting   Darvocet [Propoxyphene N-Acetaminophen ] Diarrhea and Nausea And Vomiting   Erythromycin Nausea And Vomiting    HOME MEDICATIONS: Outpatient Medications Prior to Visit  Medication Sig Dispense Refill   acetaminophen  (TYLENOL  8 HOUR) 650 MG CR tablet Take 1,300 mg by mouth every 8 (eight) hours as needed.     alendronate  (FOSAMAX ) 70 MG tablet TAKE 1 TAB ONCE A WEEK, AT LEAST 30 MIN BEFORE 1ST FOOD.DO NOT LIE DOWN FOR 30 MIN AFTER TAKING. 12 tablet 3   amLODipine  (NORVASC ) 5 MG tablet Take 5 mg by mouth in the morning.     atorvastatin  (LIPITOR) 20 MG tablet TAKE ONE TABLET BY MOUTH ONCE DAILY. 90 tablet 1   buPROPion  (WELLBUTRIN  XL) 300 MG 24 hr tablet Take 300 mg by mouth daily.     Calcium  Carbonate-Vitamin D  (CALCIUM  600 +D HIGH POTENCY) 600-10 MG-MCG TABS Take 1 tablet by mouth in the morning.     carboxymethylcellulose (REFRESH PLUS) 0.5 % SOLN Place 1 drop into both eyes 2 (two) times daily.     citalopram  (CELEXA ) 20 MG tablet Take 1 tablet (20 mg total) by mouth daily. 30 tablet 5   clopidogrel  (PLAVIX ) 75 MG tablet TAKE 1 TABLET ONCE DAILY. 90 tablet 1   docusate sodium  (  COLACE) 100 MG capsule Take 100 mg by mouth at bedtime.     ELIQUIS  2.5 MG TABS tablet Take 2.5 mg by mouth 2 (two) times daily.     lisinopril  (ZESTRIL ) 10 MG tablet Take 10 mg by mouth daily.     loratadine (CLARITIN) 10 MG tablet Take 10 mg by mouth daily.     nystatin cream (MYCOSTATIN) Apply to under breasts (rash) topically every 12 hours as needed for moisture associated rash Apply to under breasts (rash) topically two times a day for moisture associated rash for 2 Weeks     pantoprazole  (PROTONIX ) 40 MG tablet TAKE 1 TABLET ONCE DAILY. 90 tablet 1   triamcinolone cream (KENALOG) 0.1 % Apply to under  breast (rash) topically every 12 hours as needed for moisture associated rash Apply to under breasts (rash) topically two times a day for moisture associated rash for 2 Weeks     acetaminophen  (TYLENOL ) 325 MG tablet Take 650 mg by mouth at bedtime. 2 entries: 650 mg every 6 hours as needed     acetaminophen  (TYLENOL ) 325 MG tablet Take 650 mg by mouth every 6 (six) hours as needed.     apixaban  (ELIQUIS ) 5 MG TABS tablet Take 2.5 mg by mouth 2 (two) times daily.     No facility-administered medications prior to visit.    PAST MEDICAL HISTORY: Past Medical History:  Diagnosis Date   Anginal pain 08/24/2010   Echo-EF =>55%,LV normal   Arthritis    Cataract 12/2006   Both eyes  Epes MD   Chest pain, unspecified 09/04/2007   Lexiscan-- EF 72%; LV normal   Constipation    Takes flax seed and honey .  Smooth Move tea.   Depression    Diverticulitis    Head injury, closed    with fall   History of cardiac cath 2012   normal coronary arteries   Hypertension    Mitral valve prolapse    Osteopenia    Stroke (HCC) 09/02/2019    PAST SURGICAL HISTORY: Past Surgical History:  Procedure Laterality Date   ANTERIOR CERVICAL DECOMP/DISCECTOMY FUSION N/A 10/23/2012   Procedure: Cervical Five to Cervical Six, Cervical Six to Cervical Seven anterior cervical decompression with fusion plating and bonegraft;  Surgeon: Lamar LELON Peaches, MD;  Location: [facility];  Service: Neurosurgery;  Laterality: N/A;  ANTERIOR CERVICAL DECOMPRESSION/DISCECTOMY FUSION 2 LEVELS   BREAST CYST ASPIRATION  1990   benign   CARDIAC CATHETERIZATION  08/01/2010   normal coronaries   COLONOSCOPY  approx 2011   EYE SURGERY Bilateral 2011   intestinal blockage surgery  Feb. 2012   kink in small intestine per pt    FAMILY HISTORY: Family History  Problem Relation Age of Onset   Hypertension Mother    Transient ischemic attack Mother    Parkinson's disease Mother    Dementia Mother    CVA Mother     Diabetes Father    Coronary artery disease Father    Heart failure Father    Cancer Father        prostate   Depression Father    Congestive Heart Failure Father    Prostate cancer Brother    Diabetes Brother    Prostate cancer Paternal Grandmother     SOCIAL HISTORY: Social History   Socioeconomic History   Marital status: Widowed    Spouse name: Joe   Number of children: 2   Years of education: college   Highest education level: Bachelor's  degree (e.g., BA, AB, BS)  Occupational History    Comment: retired Runner, broadcasting/film/video  Tobacco Use   Smoking status: Never   Smokeless tobacco: Never  Vaping Use   Vaping status: Never Used  Substance and Sexual Activity   Alcohol use: Not Currently    Alcohol/week: 4.0 standard drinks of alcohol    Types: 2 Glasses of wine, 2 Cans of beer per week    Comment: occasionally   Drug use: No   Sexual activity: Yes  Other Topics Concern   Not on file  Social History Narrative   Diet:  Normal   Do you drink/eat things with caffeine?   Yes   Marital status:  Widow                            What year were you married? 1986 and 1960   Do you live in a house, apartment, assisted living, condo, trailer, etc)?  Apartment at [facility], independent living   Is it one or more stories? 1+   How many persons live in your home?  1   Do you have any pets in your home?  No   Current or past profession:  Runner, broadcasting/film/video, Environmental health practitioner at [employer] you exercise?   Yes                                                 Type & how often: Stretching,  Strengthening   Do you have a living will?  Yes   Do you have a DNR Form?   Do you have a POA/HPOA forms?  HPOA   Social Drivers of Corporate investment banker Strain: Low Risk  (09/03/2017)   Overall Financial Resource Strain (CARDIA)    Difficulty of Paying Living Expenses: Not hard at all  Food Insecurity: No Food Insecurity (09/03/2017)   Hunger Vital Sign    Worried About Running Out of Food in  the Last Year: Never true    Ran Out of Food in the Last Year: Never true  Transportation Needs: No Transportation Needs (09/03/2017)   PRAPARE - Administrator, Civil Service (Medical): No    Lack of Transportation (Non-Medical): No  Physical Activity: Sufficiently Active (09/03/2017)   Exercise Vital Sign    Days of Exercise per Week: 7 days    Minutes of Exercise per Session: 40 min  Stress: Stress Concern Present (09/03/2017)   [employer] of Occupational Health - Occupational Stress Questionnaire    Feeling of Stress : To some extent  Social Connections: Somewhat Isolated (09/03/2017)   Social Connection and Isolation Panel    Frequency of Communication with Friends and Family: More than three times a week    Frequency of Social Gatherings with Friends and Family: More than three times a week    Attends Religious Services: Never    Database administrator or Organizations: No    Attends Banker Meetings: Never    Marital Status: Married  Catering manager Violence: Not At Risk (09/03/2017)   Humiliation, Afraid, Rape, and Kick questionnaire    Fear of Current or Ex-Partner: No    Emotionally Abused: No    Physically Abused: No    Sexually Abused: No     PHYSICAL EXAM  GENERAL EXAM/CONSTITUTIONAL: Vitals:  Vitals:   12/11/23 1022  BP: 128/82  Pulse: 63  SpO2: 98%  Weight: 143 lb (64.9 kg)  Height: 5' 6 (1.676 m)   Body mass index is 23.08 kg/m. Wt Readings from Last 3 Encounters:  12/11/23 143 lb (64.9 kg)  11/25/23 144 lb 6.4 oz (65.5 kg)  10/15/23 144 lb 3.2 oz (65.4 kg)   Patient is in no distress; well developed, nourished and groomed; neck is supple  CARDIOVASCULAR: Examination of carotid arteries is normal; no carotid bruits Regular rate and rhythm, no murmurs Examination of peripheral vascular system by observation and palpation is normal  EYES: Ophthalmoscopic exam of optic discs and posterior segments is normal; no papilledema  or hemorrhages No results found.  MUSCULOSKELETAL: Gait, strength, tone, movements noted in Neurologic exam below  NEUROLOGIC: MENTAL STATUS:     03/19/2022   12:00 PM 09/03/2017   12:46 PM  MMSE - Mini Mental State Exam  Orientation to time  5  Orientation to Place 5 5  Registration 3 3  Attention/ Calculation 5 5  Recall 3 2  Language- name 2 objects 2 2  Language- repeat 1 1  Language- follow 3 step command 3 3  Language- read & follow direction 1 1  Write a sentence 1 1  Copy design 1 1  Total score  29   awake, alert, oriented to person, place and time recent and remote memory intact normal attention and concentration language fluent, comprehension intact, naming intact fund of knowledge appropriate  CRANIAL NERVE:  2nd - no papilledema on fundoscopic exam 2nd, 3rd, 4th, 6th - pupils equal and reactive to light, visual fields full to confrontation, extraocular muscles intact, no nystagmus; EXCEPT DECR RIGHT VISION FIELD FROM RIGHT EYE 5th - facial sensation symmetric 7th - facial strength symmetric 8th - hearing intact 9th - palate elevates symmetrically, uvula midline 11th - shoulder shrug symmetric 12th - tongue protrusion midline  MOTOR:  BILATERAL DELTOIDS 3-4 RUE BICEP / TRICEP 4+, GRIP 4+ LUE BICEP 4, TRICEP 3, GRIP 4 RIGHT HF 3-4, KE / KF 4+, DF 4+ LEFT HF 3, KE 4, KF 3, DF 4  SENSORY:  normal and symmetric to light touch, temperature, vibration; EXCEPT ABSENT VIB AT TOES / ANKLES  COORDINATION:  finger-nose-finger, fine finger movements normal  REFLEXES:  deep tendon reflexes TRACE and symmetric; EXCEPT SLIGHTLY INCR IN LEFT PATELLA  GAIT/STATION:  SCOLIOSIS AND KYPHOSIS; IN POWERCHAIR     DIAGNOSTIC DATA (LABS, IMAGING, TESTING) - I reviewed patient records, labs, notes, testing and imaging myself where available.  Lab Results  Component Value Date   WBC 5.6 07/23/2023   HGB 12.0 07/23/2023   HCT 37 07/23/2023   MCV 93.4 01/28/2022    PLT 277 07/23/2023      Component Value Date/Time   NA 141 07/23/2023 0000   K 3.8 07/23/2023 0000   CL 106 07/23/2023 0000   CL 101 05/10/2017 0000   CO2 27 (A) 07/23/2023 0000   CO2 31 05/10/2017 0000   GLUCOSE 108 (H) 01/28/2022 0427   BUN 16 07/23/2023 0000   CREATININE 0.8 07/23/2023 0000   CREATININE 0.81 01/28/2022 0427   CREATININE 0.87 10/04/2020 0650   CALCIUM  9.7 07/23/2023 0000   CALCIUM  10.6 05/10/2017 0000   PROT 7.0 10/04/2020 0650   ALBUMIN 3.9 07/23/2023 0000   AST 19 07/23/2023 0000   ALT 13 07/23/2023 0000   ALKPHOS 60 07/23/2023 0000   BILITOT 0.4 10/04/2020 0650  GFRNONAA >60 01/28/2022 0427   GFRNONAA 53 (L) 04/19/2020 0710   GFRAA 62 04/19/2020 0710   Lab Results  Component Value Date   CHOL 124 07/23/2023   HDL 52 07/23/2023   LDLCALC 61 07/23/2023   TRIG 38 (A) 07/23/2023   CHOLHDL 2.7 10/19/2021   Lab Results  Component Value Date   HGBA1C 5.3 09/03/2019   Lab Results  Component Value Date   VITAMINB12 186 07/23/2023   Lab Results  Component Value Date   TSH 2.66 07/23/2023    12/03/18 MRI brain / MRA head [I reviewed images myself and agree with interpretation. Mild atrophy and moderate chronic small vessel ischemic disease. -VRP]  1. Small subacute left occipital cortex infarct associated with severe narrowing at the left P3/P4 junction. 2. Generalized chronic small vessel ischemia that is similar to 2014.  09/02/19 MRI brain - Small acute infarct of the lateral right thalamus. - Moderate to advanced chronic microvascular ischemic changes. Chronic left occipital infarct.  09/03/19 MRA head  1. Single short-segment severe distal right P2 stenosis, which could contribute to the previously identified right thalamic infarct. 2. Otherwise normal intracranial MRA. No other large vessel occlusion, hemodynamically significant stenosis, or other vascular Abnormality.  09/03/19 carotid u/s Right Carotid: The extracranial vessels were  near-normal with only minimal  wall thickening or plaque.  Left Carotid: The extracranial vessels were near-normal with only minimal  wall thickening or plaque.   09/02/19 TTE 1. Left ventricular ejection fraction, by estimation, is 55 to 60%. The  left ventricle has normal function. The left ventricle has no regional  wall motion abnormalities. Left ventricular diastolic parameters are  consistent with Grade I diastolic  dysfunction (impaired relaxation).   2. Right ventricular systolic function is normal. The right ventricular  size is normal. There is normal pulmonary artery systolic pressure.   3. No evidence of mitral valve prolapse. The mitral valve is normal in  structure. Trivial mitral valve regurgitation. No evidence of mitral  stenosis.   4. The aortic valve is tricuspid. Aortic valve regurgitation is not  visualized. No aortic stenosis is present.   5. The inferior vena cava is normal in size with greater than 50%  respiratory variability, suggesting right atrial pressure of 3 mmHg.   06/04/23 CT head / cervical spine 1. No acute intracranial abnormality. 2. No acute fracture in the cervical spine. ACDF C5-C7 with interbody spacers. Mild multilevel spondylosis and facet arthropathy. No severe spinal canal narrowing.   ASSESSMENT AND PLAN  89 y.o. year old female here with left occipital ischemic infarction, possibly embolic. Then right thalamic small vessel stroke.   Dx:  1. Left leg weakness   2. Generalized weakness   3. B12 deficiency   4. 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    EDUARD FABIENE HANLON, MD 12/11/2023, 10:59 AM Certified in Neurology, Neurophysiology and Neuroimaging  [facility] [address], [address] [place], [place] [address] [phone]

## 2023-12-11 NOTE — Patient Instructions (Addendum)
  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

## 2023-12-12 DIAGNOSIS — R29898 Other symptoms and signs involving the musculoskeletal system: Secondary | ICD-10-CM | POA: Diagnosis not present

## 2023-12-12 DIAGNOSIS — M6281 Muscle weakness (generalized): Secondary | ICD-10-CM | POA: Diagnosis not present

## 2023-12-16 DIAGNOSIS — R29898 Other symptoms and signs involving the musculoskeletal system: Secondary | ICD-10-CM | POA: Diagnosis not present

## 2023-12-16 DIAGNOSIS — M6281 Muscle weakness (generalized): Secondary | ICD-10-CM | POA: Diagnosis not present

## 2023-12-18 DIAGNOSIS — H52223 Regular astigmatism, bilateral: Secondary | ICD-10-CM | POA: Diagnosis not present

## 2023-12-18 DIAGNOSIS — H35033 Hypertensive retinopathy, bilateral: Secondary | ICD-10-CM | POA: Diagnosis not present

## 2023-12-18 DIAGNOSIS — H53461 Homonymous bilateral field defects, right side: Secondary | ICD-10-CM | POA: Diagnosis not present

## 2023-12-18 DIAGNOSIS — H524 Presbyopia: Secondary | ICD-10-CM | POA: Diagnosis not present

## 2023-12-18 DIAGNOSIS — Z961 Presence of intraocular lens: Secondary | ICD-10-CM | POA: Diagnosis not present

## 2023-12-18 DIAGNOSIS — H04203 Unspecified epiphora, bilateral lacrimal glands: Secondary | ICD-10-CM | POA: Diagnosis not present

## 2023-12-19 DIAGNOSIS — R29898 Other symptoms and signs involving the musculoskeletal system: Secondary | ICD-10-CM | POA: Diagnosis not present

## 2023-12-19 DIAGNOSIS — M6281 Muscle weakness (generalized): Secondary | ICD-10-CM | POA: Diagnosis not present

## 2023-12-23 DIAGNOSIS — R29898 Other symptoms and signs involving the musculoskeletal system: Secondary | ICD-10-CM | POA: Diagnosis not present

## 2023-12-23 DIAGNOSIS — M6281 Muscle weakness (generalized): Secondary | ICD-10-CM | POA: Diagnosis not present

## 2023-12-26 DIAGNOSIS — M6281 Muscle weakness (generalized): Secondary | ICD-10-CM | POA: Diagnosis not present

## 2023-12-26 DIAGNOSIS — R29898 Other symptoms and signs involving the musculoskeletal system: Secondary | ICD-10-CM | POA: Diagnosis not present

## 2023-12-31 DIAGNOSIS — B351 Tinea unguium: Secondary | ICD-10-CM | POA: Diagnosis not present

## 2023-12-31 DIAGNOSIS — M79672 Pain in left foot: Secondary | ICD-10-CM | POA: Diagnosis not present

## 2023-12-31 DIAGNOSIS — M6281 Muscle weakness (generalized): Secondary | ICD-10-CM | POA: Diagnosis not present

## 2023-12-31 DIAGNOSIS — R29898 Other symptoms and signs involving the musculoskeletal system: Secondary | ICD-10-CM | POA: Diagnosis not present

## 2023-12-31 DIAGNOSIS — M79671 Pain in right foot: Secondary | ICD-10-CM | POA: Diagnosis not present

## 2024-01-01 DIAGNOSIS — F330 Major depressive disorder, recurrent, mild: Secondary | ICD-10-CM | POA: Diagnosis not present

## 2024-01-01 DIAGNOSIS — F411 Generalized anxiety disorder: Secondary | ICD-10-CM | POA: Diagnosis not present

## 2024-01-02 DIAGNOSIS — M6281 Muscle weakness (generalized): Secondary | ICD-10-CM | POA: Diagnosis not present

## 2024-01-02 DIAGNOSIS — R29898 Other symptoms and signs involving the musculoskeletal system: Secondary | ICD-10-CM | POA: Diagnosis not present

## 2024-01-07 DIAGNOSIS — R29898 Other symptoms and signs involving the musculoskeletal system: Secondary | ICD-10-CM | POA: Diagnosis not present

## 2024-01-07 DIAGNOSIS — M6281 Muscle weakness (generalized): Secondary | ICD-10-CM | POA: Diagnosis not present

## 2024-01-09 DIAGNOSIS — M6281 Muscle weakness (generalized): Secondary | ICD-10-CM | POA: Diagnosis not present

## 2024-01-09 DIAGNOSIS — R29898 Other symptoms and signs involving the musculoskeletal system: Secondary | ICD-10-CM | POA: Diagnosis not present

## 2024-01-14 DIAGNOSIS — M6281 Muscle weakness (generalized): Secondary | ICD-10-CM | POA: Diagnosis not present

## 2024-01-14 DIAGNOSIS — R29898 Other symptoms and signs involving the musculoskeletal system: Secondary | ICD-10-CM | POA: Diagnosis not present

## 2024-01-16 DIAGNOSIS — M6281 Muscle weakness (generalized): Secondary | ICD-10-CM | POA: Diagnosis not present

## 2024-01-16 DIAGNOSIS — R29898 Other symptoms and signs involving the musculoskeletal system: Secondary | ICD-10-CM | POA: Diagnosis not present

## 2024-01-20 DIAGNOSIS — M6281 Muscle weakness (generalized): Secondary | ICD-10-CM | POA: Diagnosis not present

## 2024-01-20 DIAGNOSIS — R29898 Other symptoms and signs involving the musculoskeletal system: Secondary | ICD-10-CM | POA: Diagnosis not present

## 2024-01-21 DIAGNOSIS — R41841 Cognitive communication deficit: Secondary | ICD-10-CM | POA: Diagnosis not present

## 2024-01-22 DIAGNOSIS — F411 Generalized anxiety disorder: Secondary | ICD-10-CM | POA: Diagnosis not present

## 2024-01-22 DIAGNOSIS — F330 Major depressive disorder, recurrent, mild: Secondary | ICD-10-CM | POA: Diagnosis not present

## 2024-01-23 DIAGNOSIS — M6281 Muscle weakness (generalized): Secondary | ICD-10-CM | POA: Diagnosis not present

## 2024-01-23 DIAGNOSIS — R29898 Other symptoms and signs involving the musculoskeletal system: Secondary | ICD-10-CM | POA: Diagnosis not present

## 2024-01-27 DIAGNOSIS — R29898 Other symptoms and signs involving the musculoskeletal system: Secondary | ICD-10-CM | POA: Diagnosis not present

## 2024-01-27 DIAGNOSIS — M6281 Muscle weakness (generalized): Secondary | ICD-10-CM | POA: Diagnosis not present

## 2024-01-28 DIAGNOSIS — R41841 Cognitive communication deficit: Secondary | ICD-10-CM | POA: Diagnosis not present

## 2024-01-29 DIAGNOSIS — M6281 Muscle weakness (generalized): Secondary | ICD-10-CM | POA: Diagnosis not present

## 2024-01-29 DIAGNOSIS — R29898 Other symptoms and signs involving the musculoskeletal system: Secondary | ICD-10-CM | POA: Diagnosis not present

## 2024-02-04 DIAGNOSIS — M6281 Muscle weakness (generalized): Secondary | ICD-10-CM | POA: Diagnosis not present

## 2024-02-04 DIAGNOSIS — R41841 Cognitive communication deficit: Secondary | ICD-10-CM | POA: Diagnosis not present

## 2024-02-04 DIAGNOSIS — R29898 Other symptoms and signs involving the musculoskeletal system: Secondary | ICD-10-CM | POA: Diagnosis not present

## 2024-02-06 DIAGNOSIS — M6281 Muscle weakness (generalized): Secondary | ICD-10-CM | POA: Diagnosis not present

## 2024-02-06 DIAGNOSIS — R29898 Other symptoms and signs involving the musculoskeletal system: Secondary | ICD-10-CM | POA: Diagnosis not present

## 2024-02-24 ENCOUNTER — Non-Acute Institutional Stay: Payer: Self-pay | Admitting: Nurse Practitioner

## 2024-02-24 ENCOUNTER — Encounter: Payer: Self-pay | Admitting: Nurse Practitioner

## 2024-02-24 DIAGNOSIS — F418 Other specified anxiety disorders: Secondary | ICD-10-CM | POA: Diagnosis not present

## 2024-02-24 DIAGNOSIS — I63339 Cerebral infarction due to thrombosis of unspecified posterior cerebral artery: Secondary | ICD-10-CM | POA: Diagnosis not present

## 2024-02-24 DIAGNOSIS — I10 Essential (primary) hypertension: Secondary | ICD-10-CM

## 2024-02-24 DIAGNOSIS — M810 Age-related osteoporosis without current pathological fracture: Secondary | ICD-10-CM

## 2024-02-24 DIAGNOSIS — K219 Gastro-esophageal reflux disease without esophagitis: Secondary | ICD-10-CM | POA: Diagnosis not present

## 2024-02-24 DIAGNOSIS — K5901 Slow transit constipation: Secondary | ICD-10-CM

## 2024-02-24 DIAGNOSIS — N3942 Incontinence without sensory awareness: Secondary | ICD-10-CM

## 2024-02-24 DIAGNOSIS — R1310 Dysphagia, unspecified: Secondary | ICD-10-CM | POA: Diagnosis not present

## 2024-02-24 DIAGNOSIS — E785 Hyperlipidemia, unspecified: Secondary | ICD-10-CM

## 2024-02-24 DIAGNOSIS — R053 Chronic cough: Secondary | ICD-10-CM

## 2024-02-24 DIAGNOSIS — R269 Unspecified abnormalities of gait and mobility: Secondary | ICD-10-CM

## 2024-02-24 NOTE — Assessment & Plan Note (Signed)
 takes Pantoprazole ,  Hgb 12 07/23/23 Stable.

## 2024-02-24 NOTE — Assessment & Plan Note (Signed)
 Her mood is stable  takes Citalopram , Wellbutrin ,  f/u psycho therapy, TSH 2.66 07/23/23

## 2024-02-24 NOTE — Assessment & Plan Note (Signed)
 sometimes coughs when swallowing, worked with ST.

## 2024-02-24 NOTE — Progress Notes (Signed)
 " Location:   [facility] Room Number: 904 Place of Service:  [facility] (13) Provider: Larwance Nicolet Griffy NP  Leanda Padmore X, NP  Patient Care Team: Greidy Sherard X, NP as PCP - General (Internal Medicine) Lonni Slain, MD as PCP - Cardiology (Cardiology) Camillia Marcy X, NP as Nurse Practitioner (Internal Medicine) Debarah Lorrene DEL., MD (Ophthalmology) Joshua Sieving, MD as Consulting Physician (Dermatology) Joshua Sieving, MD as Consulting Physician (Dermatology) Darina Barrio, MD as Consulting Physician (Gynecology)  Extended Emergency Contact Information Primary Emergency Contact: Lindsay,Julie  [place] Mobile Phone: [phone] Relation: Daughter Secondary Emergency Contact: Black,Joel Mobile Phone: [phone] Relation: Son  Code Status:  DNR Goals of care: Advanced Directive information    08/26/2023   12:15 PM  Advanced Directives  Does Patient Have a Medical Advance Directive? Yes  Type of Estate Agent of [place];Living will;Out of facility DNR (pink MOST or yellow form)  Does patient want to make changes to medical advance directive? No - Patient declined  Copy of Healthcare Power of Attorney in Chart? Yes - validated most recent copy scanned in chart (See row information)     Chief Complaint  Patient presents with   Medical Management of Chronic Issues    HPI:  Pt is a 89 y.o. female seen today for medical management of chronic diseases.       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 Takes Plavix . Atorvastatin . affected gait(balance issues and vision issues(seeing Ophthalmology), feels weak left leg even if muscle strength 5/5             Bradycardia, normalized after dc'd Metoprolol , followed by Cardiology              CTA 01/27/22 showed multiple subsegmental pulmonary emboli and pericardial effusion identified, on Eliquis , Plavix , f/u Cardiology. EF 65-70% 01/27/22.  Venous US  BLE no DVT.              HTN, takes Amlodipine , Lisinopril , off Metoprolol  2/2 HR 50s and c/o daytime sleepiness. Bun/creat 16/0.8 07/23/23             Constipation, takes Colace                               GERD, takes Pantoprazole ,  Hgb 12 07/23/23             Dysphagia, sometimes coughs when swallowing, worked with ST.              Gait abnormality, risk of  falling, power w/c for mobility. Last fall 02/23/24 when the patient walked without walker in her room, landed on her buttock, c/o soreness to her buttocks.              Chronic occasional dry cough for about a year, left side throat tickles sometimes during eating, then cough, ? Resultant of CVA.              OP, takes Fosamax , Ca, Vit D, t score -2.3 10/25/20, 06/13/23 DEXA t score -1.8             Depression, takes Citalopram , Wellbutrin ,  f/u psycho therapy, TSH 2.66 07/23/23             Pericardial effusion, Hx of it, present again on CTA 01/27/22             Hyperlipidemia, takes Atorvastatin , LDL 61  07/23/23             OA pain, power w/c             Urinary incontinence, progressing             Hx of skin candidiasis, under breast, arms, Triamcinolone  and Nystatin cream effective.     Past Medical History:  Diagnosis Date   Anginal pain 08/24/2010   Echo-EF =>55%,LV normal   Arthritis    Cataract 12/2006   Both eyes  Epes MD   Chest pain, unspecified 09/04/2007   Lexiscan-- EF 72%; LV normal   Constipation    Takes flax seed and honey .  Smooth Move tea.   Depression    Diverticulitis    Head injury, closed    with fall   History of cardiac cath 2012   normal coronary arteries   Hypertension    Mitral valve prolapse    Osteopenia    Stroke (HCC) 09/02/2019   Past Surgical History:  Procedure Laterality Date   ANTERIOR CERVICAL DECOMP/DISCECTOMY FUSION N/A 10/23/2012   Procedure: Cervical Five to Cervical Six, Cervical Six to Cervical Seven anterior cervical decompression with fusion plating and bonegraft;   Surgeon: Lamar LELON Peaches, MD;  Location: [facility];  Service: Neurosurgery;  Laterality: N/A;  ANTERIOR CERVICAL DECOMPRESSION/DISCECTOMY FUSION 2 LEVELS   BREAST CYST ASPIRATION  1990   benign   CARDIAC CATHETERIZATION  08/01/2010   normal coronaries   COLONOSCOPY  approx 2011   EYE SURGERY Bilateral 2011   intestinal blockage surgery  Feb. 2012   kink in small intestine per pt    Allergies[1]  Allergies as of 02/24/2024       Reactions   Contrast Media [iodinated Contrast Media] Hives   Hives during IVP at urology office '02, requires 13 hr prep///a.calhoun Patient came thru ER and had a 1 hour pre-medication and did fine   Oxycontin  [oxycodone ] Other (See Comments)   Decreased appetite.   Boniva [ibandronate] Nausea And Vomiting, Other (See Comments)   Weakness    Codeine Diarrhea, Nausea And Vomiting   Darvocet [propoxyphene N-acetaminophen ] Diarrhea, Nausea And Vomiting   Erythromycin Nausea And Vomiting        Medication List        Accurate as of February 24, 2024  3:04 PM. If you have any questions, ask your nurse or doctor.          alendronate  70 MG tablet Commonly known as: FOSAMAX  TAKE 1 TAB ONCE A WEEK, AT LEAST 30 MIN BEFORE 1ST FOOD.DO NOT LIE DOWN FOR 30 MIN AFTER TAKING.   amLODipine  5 MG tablet Commonly known as: NORVASC  Take 5 mg by mouth in the morning.   atorvastatin  20 MG tablet Commonly known as: LIPITOR TAKE ONE TABLET BY MOUTH ONCE DAILY.   buPROPion  300 MG 24 hr tablet Commonly known as: WELLBUTRIN  XL Take 300 mg by mouth daily.   Calcium  600 +D High Potency 600-10 MG-MCG Tabs Generic drug: Calcium  Carbonate-Vitamin D  Take 1 tablet by mouth in the morning.   carboxymethylcellulose 0.5 % Soln Commonly known as: REFRESH PLUS Place 1 drop into both eyes 2 (two) times daily.   citalopram  20 MG tablet Commonly known as: CELEXA  Take 1 tablet (20 mg total) by mouth daily.   clopidogrel  75 MG tablet Commonly known as:  PLAVIX  TAKE 1 TABLET ONCE DAILY.   docusate sodium  100 MG capsule Commonly known as: COLACE Take 100 mg by mouth at  bedtime.   Eliquis  2.5 MG Tabs tablet Generic drug: apixaban  Take 2.5 mg by mouth 2 (two) times daily.   lisinopril  10 MG tablet Commonly known as: ZESTRIL  Take 10 mg by mouth daily.   loratadine 10 MG tablet Commonly known as: CLARITIN Take 10 mg by mouth daily.   nystatin cream Commonly known as: MYCOSTATIN Apply to under breasts (rash) topically every 12 hours as needed for moisture associated rash Apply to under breasts (rash) topically two times a day for moisture associated rash for 2 Weeks   pantoprazole  40 MG tablet Commonly known as: PROTONIX  TAKE 1 TABLET ONCE DAILY.   triamcinolone  cream 0.1 % Commonly known as: KENALOG Apply to under breast (rash) topically every 12 hours as needed for moisture associated rash Apply to under breasts (rash) topically two times a day for moisture associated rash for 2 Weeks   Tylenol  8 Hour 650 MG CR tablet Generic drug: acetaminophen  Take 1,300 mg by mouth every 8 (eight) hours as needed.        Review of Systems  Constitutional:  Negative for appetite change, fatigue and fever.  HENT:  Positive for hearing loss and rhinorrhea. Negative for congestion, sinus pressure, sinus pain, sore throat, trouble swallowing and voice change.   Eyes:  Negative for visual disturbance.  Respiratory:  Positive for cough. Negative for chest tightness, shortness of breath and wheezing.        Chronic occasional cough with clear phlegm. DOE since 01/27/22  Cardiovascular:  Negative for leg swelling.  Gastrointestinal:  Negative for abdominal pain and constipation.  Genitourinary:  Negative for dysuria, frequency and urgency.       Occasional urinary leakage   Musculoskeletal:  Positive for arthralgias, back pain and gait problem.  Skin:  Negative for rash.  Neurological:  Positive for weakness. Negative for speech difficulty  and headaches.       Memory lapses occasionally. Left sided weakness.   Psychiatric/Behavioral:  Negative for behavioral problems and sleep disturbance. The patient is not nervous/anxious.     Immunization History  Administered Date(s) Administered   Fluad Quad(high Dose 65+) 12/17/2018   INFLUENZA, HIGH DOSE SEASONAL PF 12/08/2016, 12/19/2020   Influenza-Unspecified 12/16/2019, 12/25/2021, 12/05/2022   Moderna Sars-Covid-2 Vaccination 03/09/2019, 04/06/2019, 01/12/2020, 12/19/2022   Pfizer Covid-19 Vaccine Bivalent Booster 95yrs & up 01/04/2022   Pneumococcal Conjugate-13 06/12/2013   Pneumococcal Polysaccharide-23 07/28/2002   RSV,unspecified 04/05/2022   Tdap 07/27/2009, 04/03/2023   Zoster Recombinant(Shingrix) 01/08/2017, 06/03/2017   Pertinent  Health Maintenance Due  Topic Date Due   Influenza Vaccine  10/04/2023   Bone Density Scan  Completed      01/27/2022   10:25 AM 01/28/2022    8:15 AM 03/13/2022    9:11 AM 03/16/2022    2:03 PM 08/22/2022    9:06 AM  Fall Risk  Falls in the past year?   0 0 0  Was there an injury with Fall?   0  0  0   Fall Risk Category Calculator   0 0 0  Fall Risk Category (Retired)   Low  Low    (RETIRED) Patient Fall Risk Level Low fall risk  Moderate fall risk  Low fall risk  Low fall risk    Patient at Risk for Falls Due to   No Fall Risks No Fall Risks No Fall Risks  Fall risk Follow up   Falls evaluation completed  Falls evaluation completed       Data saved with a previous  flowsheet row definition   Functional Status Survey:    Vitals:   02/24/24 1318  BP: 136/80  Pulse: (!) 59  Resp: 19  Temp: (!) 97.3 F (36.3 C)  SpO2: 96%  Weight: 146 lb 6.4 oz (66.4 kg)   Body mass index is 23.63 kg/m. Physical Exam Vitals and nursing note reviewed.  Constitutional:      Appearance: Normal appearance.  HENT:     Head: Normocephalic and atraumatic.     Nose: Nose normal.     Mouth/Throat:     Mouth: Mucous membranes are moist.   Eyes:     Extraocular Movements: Extraocular movements intact.     Conjunctiva/sclera: Conjunctivae normal.     Pupils: Pupils are equal, round, and reactive to light.     Comments: Lateral right eye peripheral vision field loss since CVA 11/2018.  Cardiovascular:     Rate and Rhythm: Normal rate and regular rhythm.     Heart sounds: No murmur heard. Pulmonary:     Effort: Pulmonary effort is normal.     Breath sounds: No rales.  Abdominal:     General: Bowel sounds are normal.     Palpations: Abdomen is soft.     Tenderness: There is no abdominal tenderness.  Musculoskeletal:        General: Tenderness present.     Cervical back: Normal range of motion and neck supple.     Right lower leg: No edema.     Left lower leg: No edema.     Comments: Kyphoscoliosis. Left shoulder over head ROM decreased.  C/o left knee pain, weakness Soreness in buttocks since fall 02/23/24  Skin:    General: Skin is warm and dry.     Findings: No bruising or rash.     Comments:    Neurological:     Mental Status: She is alert and oriented to person, place, and time. Mental status is at baseline.     Motor: Weakness present.     Coordination: Coordination normal.     Gait: Gait abnormal.     Deep Tendon Reflexes: Reflexes normal.     Comments: Left sided weakness with muscle strength 5/5. W/c for mobility   Psychiatric:        Mood and Affect: Mood normal.        Behavior: Behavior normal.     Labs reviewed: Recent Labs    03/05/23 0000 07/23/23 0000  NA 138 141  K 3.6 3.8  CL 103 106  CO2 25* 27*  BUN 24* 16  CREATININE 1.0 0.8  CALCIUM  9.2 9.7   Recent Labs    03/05/23 0000 07/23/23 0000  AST 25 19  ALT 19 13  ALKPHOS 43 60  ALBUMIN 3.8 3.9   Recent Labs    03/05/23 0000 07/23/23 0000  WBC 6.1 5.6  NEUTROABS 4,477.00 3,052.00  HGB 12.1 12.0  HCT 37 37  PLT 229 277   Lab Results  Component Value Date   TSH 2.66 07/23/2023   Lab Results  Component Value Date    HGBA1C 5.3 09/03/2019   Lab Results  Component Value Date   CHOL 124 07/23/2023   HDL 52 07/23/2023   LDLCALC 61 07/23/2023   TRIG 38 (A) 07/23/2023   CHOLHDL 2.7 10/19/2021    Significant Diagnostic Results in last 30 days:  No results found.  Assessment/Plan  Gait abnormality Gait abnormality, risk of  falling, power w/c for mobility. Last fall 02/23/24 when the  patient walked without walker in her room, landed on her buttock, c/o soreness to her buttocks.   Cough Chronic occasional dry cough for about a year, left side throat tickles sometimes during eating, then cough, ? Resultant of CVA.   Osteoporosis  takes Fosamax , Ca, Vit D, t score -2.3 10/25/20, 06/13/23 DEXA t score -1.8  Depression with anxiety Her mood is stable  takes Citalopram , Wellbutrin ,  f/u psycho therapy, TSH 2.66 07/23/23  Hyperlipidemia takes Atorvastatin , LDL 61 07/23/23  Incontinent of urine Chronic, progressing.   Dysphagia sometimes coughs when swallowing, worked with ST.   GERD (gastroesophageal reflux disease) takes Pantoprazole ,  Hgb 12 07/23/23 Stable.   Slow transit constipation Stable,   Hypertension Blood pressure is controlled  takes Colace         Family/ staff Communication: plan of care reviewed with the patient and charge nurse.   Labs/tests ordered:  none       [1]  Allergies Allergen Reactions   Contrast Media [Iodinated Contrast Media] Hives    Hives during IVP at urology office '02, requires 13 hr prep///a.calhoun  Patient came thru ER and had a 1 hour pre-medication and did fine   Oxycontin  [Oxycodone ] Other (See Comments)    Decreased appetite.   Boniva [Ibandronate] Nausea And Vomiting and Other (See Comments)    Weakness    Codeine Diarrhea and Nausea And Vomiting   Darvocet [Propoxyphene N-Acetaminophen ] Diarrhea and Nausea And Vomiting   Erythromycin Nausea And Vomiting   "

## 2024-02-24 NOTE — Assessment & Plan Note (Signed)
 Gait abnormality, risk of  falling, power w/c for mobility. Last fall 02/23/24 when the patient walked without walker in her room, landed on her buttock, c/o soreness to her buttocks.

## 2024-02-24 NOTE — Assessment & Plan Note (Signed)
 Stable,

## 2024-02-24 NOTE — Assessment & Plan Note (Signed)
Chronic, progressing 

## 2024-02-24 NOTE — Assessment & Plan Note (Signed)
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 Takes Plavix . Atorvastatin . affected gait(balance issues and vision issues(seeing Ophthalmology), feels weak left leg even if muscle strength 5/5                          CTA 01/27/22 showed multiple subsegmental pulmonary emboli and pericardial effusion identified, on Eliquis , Plavix , f/u Cardiology. EF 65-70% 01/27/22. Venous US  BLE no DVT.  "

## 2024-02-24 NOTE — Assessment & Plan Note (Signed)
 takes Fosamax , Ca, Vit D, t score -2.3 10/25/20, 06/13/23 DEXA t score -1.8

## 2024-02-24 NOTE — Assessment & Plan Note (Signed)
 takes Atorvastatin , LDL 61 07/23/23

## 2024-02-24 NOTE — Assessment & Plan Note (Signed)
 Blood pressure is controlled  takes Colace

## 2024-02-24 NOTE — Assessment & Plan Note (Signed)
Chronic occasional dry cough for about a year, left side throat tickles sometimes during eating, then cough, ? Resultant of CVA.

## 2024-03-04 ENCOUNTER — Non-Acute Institutional Stay (SKILLED_NURSING_FACILITY): Admitting: Family Medicine

## 2024-03-04 DIAGNOSIS — F418 Other specified anxiety disorders: Secondary | ICD-10-CM | POA: Diagnosis not present

## 2024-03-04 DIAGNOSIS — E785 Hyperlipidemia, unspecified: Secondary | ICD-10-CM | POA: Diagnosis not present

## 2024-03-04 DIAGNOSIS — I2782 Chronic pulmonary embolism: Secondary | ICD-10-CM | POA: Diagnosis not present

## 2024-03-04 DIAGNOSIS — I639 Cerebral infarction, unspecified: Secondary | ICD-10-CM | POA: Diagnosis not present

## 2024-03-04 DIAGNOSIS — I10 Essential (primary) hypertension: Secondary | ICD-10-CM | POA: Diagnosis not present

## 2024-03-04 NOTE — Progress Notes (Signed)
 " Provider:  Garnette Pinal, MD Location:      Place of Service:     PCP: [name], [name], NP Patient Care Team: [name], [name], NP as PCP - General (Internal Medicine) Lonni Slain, MD as PCP - Cardiology (Cardiology) [name], [name], NP as Nurse Practitioner (Internal Medicine) Debarah Lorrene DEL., MD (Ophthalmology) Joshua Sieving, MD as Consulting Physician (Dermatology) Joshua Sieving, MD as Consulting Physician (Dermatology) Darina Barrio, MD as Consulting Physician (Gynecology)  Extended Emergency Contact Information Primary Emergency Contact: Lindsay,Julie  [place] Mobile Phone: [phone] Relation: Daughter Secondary Emergency Contact: Black,Joel Mobile Phone: [phone] Relation: Son  Code Status:  Goals of Care: Advanced Directive information    08/26/2023   12:15 PM  Advanced Directives  Does Patient Have a Medical Advance Directive? Yes  Type of Estate Agent of [place];Living will;Out of facility DNR (pink MOST or yellow form)  Does patient want to make changes to medical advance directive? No - Patient declined  Copy of Healthcare Power of Attorney in Chart? Yes - validated most recent copy scanned in chart (See row information)     HPI: Patient is a 89 y.o. female seen today for admission to [facility] skilled nursing facility.  Patient has progressed from independent care to [facility] living [facility] as her medical needs have increased.  Most recently seen by neurology for left leg weakness which is thought to be residual of previous CVAs as well as some likely lumbar disc disease.  She has had several falls with no injury but is requiring more help in terms of ADLs such as bathing and dressing.  She has done well otherwise and enjoyed living in [facility].  She has friends from all 3 levels of care.  She moved here yesterday but has already seen all friends here which bodes well for her doing  well going forward.  Other medical issues include old CVAs, depression, hypertension, gait abnormality, hyperlipidemia osteoporosis, and history of pulmonary embolus currently on anticoagulation.  Past Medical History:  Diagnosis Date   Anginal pain 08/24/2010   Echo-EF =>55%,LV normal   Arthritis    Cataract 12/2006   Both eyes  Epes MD   Chest pain, unspecified 09/04/2007   Lexiscan-- EF 72%; LV normal   Constipation    Takes flax seed and honey .  Smooth Move tea.   Depression    Diverticulitis    Head injury, closed    with fall   History of cardiac cath 2012   normal coronary arteries   Hypertension    Mitral valve prolapse    Osteopenia    Stroke (HCC) 09/02/2019   Past Surgical History:  Procedure Laterality Date   ANTERIOR CERVICAL DECOMP/DISCECTOMY FUSION N/A 10/23/2012   Procedure: Cervical Five to Cervical Six, Cervical Six to Cervical Seven anterior cervical decompression with fusion plating and bonegraft;  Surgeon: Lamar LELON Peaches, MD;  Location: [facility];  Service: Neurosurgery;  Laterality: N/A;  ANTERIOR CERVICAL DECOMPRESSION/DISCECTOMY FUSION 2 LEVELS   BREAST CYST ASPIRATION  1990   benign   CARDIAC CATHETERIZATION  08/01/2010   normal coronaries   COLONOSCOPY  approx 2011   EYE SURGERY Bilateral 2011   intestinal blockage surgery  Feb. 2012   kink in small intestine per pt    reports that she has never smoked. She has never used smokeless tobacco. She reports that she does not currently use alcohol after a past usage of about 4.0  standard drinks of alcohol per week. She reports that she does not use drugs. Social History   Socioeconomic History   Marital status: Widowed    Spouse name: Joe   Number of children: 2   Years of education: college   Highest education level: Bachelor's degree (e.g., BA, AB, BS)  Occupational History    Comment: retired runner, broadcasting/film/video  Tobacco Use   Smoking status: Never   Smokeless tobacco: Never  Vaping Use    Vaping status: Never Used  Substance and Sexual Activity   Alcohol use: Not Currently    Alcohol/week: 4.0 standard drinks of alcohol    Types: 2 Glasses of wine, 2 Cans of beer per week    Comment: occasionally   Drug use: No   Sexual activity: Yes  Other Topics Concern   Not on file  Social History Narrative   Diet:  Normal   Do you drink/eat things with caffeine?   Yes   Marital status:  Widow                            What year were you married? 1986 and 1960   Do you live in a house, apartment, assisted living, condo, trailer, etc)?  Apartment at [facility], independent living   Is it one or more stories? 1+   How many persons live in your home?  1   Do you have any pets in your home?  No   Current or past profession:  Runner, Broadcasting/film/video, Environmental health practitioner at [employer] you exercise?   Yes                                                 Type & how often: Stretching,  Strengthening   Do you have a living will?  Yes   Do you have a DNR Form?   Do you have a POA/HPOA forms?  HPOA   Social Drivers of Health   Tobacco Use: Low Risk (02/24/2024)   Patient History    Smoking Tobacco Use: Never    Smokeless Tobacco Use: Never    Passive Exposure: Not on file  Financial Resource Strain: Not on file  Food Insecurity: Not on file  Transportation Needs: Not on file  Physical Activity: Not on file  Stress: Not on file  Social Connections: Not on file  Intimate Partner Violence: Not on file  Depression (PHQ2-9): Low Risk (03/28/2023)   Depression (PHQ2-9)    PHQ-2 Score: 0  Alcohol Screen: Not on file  Housing: Not on file  Utilities: Not on file  Health Literacy: Not on file    Functional Status Survey:    Family History  Problem Relation Age of Onset   Hypertension Mother    Transient ischemic attack Mother    Parkinson's disease Mother    Dementia Mother    CVA Mother    Diabetes Father    Coronary artery disease Father    Heart failure Father    Cancer  Father        prostate   Depression Father    Congestive Heart Failure Father    Prostate cancer Brother    Diabetes Brother    Prostate cancer Paternal Grandmother     Health Maintenance  Topic Date Due   Influenza  Vaccine  10/04/2023   COVID-19 Vaccine (6 - 2025-26 season) 11/04/2023   Medicare Annual Wellness (AWV)  03/27/2024   DTaP/Tdap/Td (3 - Td or Tdap) 04/02/2033   Pneumococcal Vaccine: 50+ Years  Completed   Bone Density Scan  Completed   Zoster Vaccines- Shingrix  Completed   Meningococcal B Vaccine  Aged Out    Allergies[1]  Outpatient Encounter Medications as of 03/04/2024  Medication Sig   acetaminophen  (TYLENOL  8 HOUR) 650 MG CR tablet Take 1,300 mg by mouth every 8 (eight) hours as needed.   alendronate  (FOSAMAX ) 70 MG tablet TAKE 1 TAB ONCE A WEEK, AT LEAST 30 MIN BEFORE 1ST FOOD.DO NOT LIE DOWN FOR 30 MIN AFTER TAKING.   amLODipine  (NORVASC ) 5 MG tablet Take 5 mg by mouth in the morning.   atorvastatin  (LIPITOR) 20 MG tablet TAKE ONE TABLET BY MOUTH ONCE DAILY.   buPROPion  (WELLBUTRIN  XL) 300 MG 24 hr tablet Take 300 mg by mouth daily.   Calcium  Carbonate-Vitamin D  (CALCIUM  600 +D HIGH POTENCY) 600-10 MG-MCG TABS Take 1 tablet by mouth in the morning.   carboxymethylcellulose (REFRESH PLUS) 0.5 % SOLN Place 1 drop into both eyes 2 (two) times daily.   citalopram  (CELEXA ) 20 MG tablet Take 1 tablet (20 mg total) by mouth daily.   clopidogrel  (PLAVIX ) 75 MG tablet TAKE 1 TABLET ONCE DAILY.   docusate sodium  (COLACE) 100 MG capsule Take 100 mg by mouth at bedtime.   ELIQUIS  2.5 MG TABS tablet Take 2.5 mg by mouth 2 (two) times daily.   lisinopril  (ZESTRIL ) 10 MG tablet Take 10 mg by mouth daily.   loratadine (CLARITIN) 10 MG tablet Take 10 mg by mouth daily.   nystatin cream (MYCOSTATIN) Apply to under breasts (rash) topically every 12 hours as needed for moisture associated rash Apply to under breasts (rash) topically two times a day for moisture associated rash  for 2 Weeks   pantoprazole  (PROTONIX ) 40 MG tablet TAKE 1 TABLET ONCE DAILY.   triamcinolone  cream (KENALOG) 0.1 % Apply to under breast (rash) topically every 12 hours as needed for moisture associated rash Apply to under breasts (rash) topically two times a day for moisture associated rash for 2 Weeks   No facility-administered encounter medications on file as of 03/04/2024.    Review of Systems  Constitutional:  Positive for activity change.  HENT:  Positive for hearing loss.   Eyes:  Positive for visual disturbance.  Respiratory: Negative.    Cardiovascular: Negative.   Gastrointestinal: Negative.   Genitourinary: Negative.   Musculoskeletal:  Positive for arthralgias and gait problem.  Psychiatric/Behavioral: Negative.    All other systems reviewed and are negative.   There were no vitals filed for this visit. There is no height or weight on file to calculate BMI. Physical Exam Vitals and nursing note reviewed.  Constitutional:      Appearance: Normal appearance.  HENT:     Head: Normocephalic.     Nose: Nose normal.     Mouth/Throat:     Mouth: Mucous membranes are moist.     Pharynx: Oropharynx is clear.  Cardiovascular:     Rate and Rhythm: Normal rate and regular rhythm.  Pulmonary:     Effort: Pulmonary effort is normal.     Breath sounds: Normal breath sounds.  Abdominal:     Palpations: Abdomen is soft.  Musculoskeletal:        General: Normal range of motion.  Neurological:     General: No focal  deficit present.     Mental Status: She is alert and oriented to person, place, and time.     Gait: Gait abnormal.     Comments: Patient ambulates via motorized wheelchair and needs assistance with bathing  Psychiatric:        Mood and Affect: Mood normal.        Behavior: Behavior normal.     Labs reviewed: Basic Metabolic Panel: Recent Labs    07/23/23 0000  NA 141  K 3.8  CL 106  CO2 27*  BUN 16  CREATININE 0.8  CALCIUM  9.7   Liver Function  Tests: Recent Labs    07/23/23 0000  AST 19  ALT 13  ALKPHOS 60  ALBUMIN 3.9   No results for input(s): LIPASE, AMYLASE in the last 8760 hours. No results for input(s): AMMONIA in the last 8760 hours. CBC: Recent Labs    07/23/23 0000  WBC 5.6  NEUTROABS 3,052.00  HGB 12.0  HCT 37  PLT 277   Cardiac Enzymes: No results for input(s): CKTOTAL, CKMB, CKMBINDEX, TROPONINI in the last 8760 hours. BNP: Invalid input(s): POCBNP Lab Results  Component Value Date   HGBA1C 5.3 09/03/2019   Lab Results  Component Value Date   TSH 2.66 07/23/2023   Lab Results  Component Value Date   VITAMINB12 186 07/23/2023   No results found for: FOLATE No results found for: IRON, TIBC, FERRITIN  Imaging and Procedures obtained prior to SNF admission: DG Bone Density Result Date: 06/17/2023 EXAM: DUAL X-RAY ABSORPTIOMETRY (DXA) FOR BONE MINERAL DENSITY 06/13/2023 2:12 pm CLINICAL DATA:  89 year old Female Postmenopausal. Screening for osteoporosis History of fragility fracture. History of hip and vertebral fracture. Patient is or has been on bone building therapies. TECHNIQUE: An axial (e.g., hips, spine) and/or appendicular (e.g., radius) exam was performed, as appropriate, using GE Secretary/administrator at [employer]. Images are obtained for bone mineral density measurement and are not obtained for diagnostic purposes. [id number] Exclusions: Lumbar spine, bilateral hip COMPARISON:  None. New baseline. FINDINGS: Scan quality: Good. LEFT FOREARM (RADIUS 33%): BMD (in g/cm2): 0.729 T-score: -1.8 Z-score: 1.5 RIGHT FOREARM (RADIUS 33%): BMD (in g/cm2): 0.798 T-score: -1.0 Z-score: 2.3 FRAX 10-YEAR PROBABILITY OF FRACTURE: FRAX not reported as there is no evaluable hip. IMPRESSION: Osteopenia based on BMD. Fracture risk is unknown due to history of bone building therapy. RECOMMENDATIONS: 1. All patients should optimize calcium  and vitamin D  intake. 2. Consider  FDA-approved medical therapies in postmenopausal women and men aged 40 years and older, based on the following: - A hip or vertebral (clinical or morphometric) fracture - T-score less than or equal to -2.5 and secondary causes have been excluded. - Low bone mass (T-score between -1.0 and -2.5) and a 10-year probability of a hip fracture greater than or equal to 3% or a 10-year probability of a major osteoporosis-related fracture greater than or equal to 20% based on the [place] -adapted WHO algorithm. - Clinician judgment and/or patient preferences may indicate treatment for people with 10-year fracture probabilities above or below these levels 3. Patients with diagnosis of osteoporosis or at high risk for fracture should have regular bone mineral density tests. For patients eligible for Medicare, routine testing is allowed once every 2 years. The testing frequency can be increased to one year for patients who have rapidly progressing disease, those who are receiving or discontinuing medical therapy to restore bone mass, or have additional risk factors. Electronically Signed   By: Dina  Arceo  M.D.   On: 06/17/2023 07:48    Assessment/Plan Assessment & Plan Other chronic pulmonary embolism without acute cor pulmonale (HCC) Doing well on Eliquis .  Plan to continue indefinitely Ischemic stroke (HCC) Only residual may be weakness of left lower extremity.  Continues with Plavix  and adequate blood pressure control Primary hypertension Currently takes amlodipine  and lisinopril  blood pressures have been adequately controlled Hyperlipidemia, unspecified hyperlipidemia type On low-dose atorvastatin .  LDL is at goal Depression with anxiety Symptoms remain well managed with Wellbutrin  and citalopram .  Has seen psychiatry in the past  Family/ staff Communication:   Labs/tests ordered:  .smmsig     [1]  Allergies Allergen Reactions   Contrast Media [Iodinated Contrast Media] Hives    Hives during IVP at  urology office '02, requires 13 hr prep///a.[email or web address]  Patient came thru ER and had a 1 hour pre-medication and did fine   Oxycontin  [Oxycodone ] Other (See Comments)    Decreased appetite.   Boniva [Ibandronate] Nausea And Vomiting and Other (See Comments)    Weakness    Codeine Diarrhea and Nausea And Vomiting   Darvocet [Propoxyphene N-Acetaminophen ] Diarrhea and Nausea And Vomiting   Erythromycin Nausea And Vomiting   "

## 2024-03-04 NOTE — Assessment & Plan Note (Addendum)
 Symptoms remain well managed with Wellbutrin  and citalopram .  Has seen psychiatry in the past

## 2024-03-04 NOTE — Assessment & Plan Note (Addendum)
 Only residual may be weakness of left lower extremity.  Continues with Plavix  and adequate blood pressure control

## 2024-03-04 NOTE — Assessment & Plan Note (Addendum)
 Doing well on Eliquis .  Plan to continue indefinitely

## 2024-03-04 NOTE — Assessment & Plan Note (Addendum)
 Currently takes amlodipine  and lisinopril  blood pressures have been adequately controlled

## 2024-03-04 NOTE — Assessment & Plan Note (Addendum)
 On low-dose atorvastatin .  LDL is at goal

## 2024-03-27 ENCOUNTER — Non-Acute Institutional Stay: Payer: Self-pay | Admitting: Nurse Practitioner

## 2024-03-27 ENCOUNTER — Encounter: Payer: Self-pay | Admitting: Nurse Practitioner

## 2024-03-27 DIAGNOSIS — F418 Other specified anxiety disorders: Secondary | ICD-10-CM

## 2024-03-27 DIAGNOSIS — K5901 Slow transit constipation: Secondary | ICD-10-CM | POA: Diagnosis not present

## 2024-03-27 DIAGNOSIS — I10 Essential (primary) hypertension: Secondary | ICD-10-CM | POA: Diagnosis not present

## 2024-03-27 DIAGNOSIS — R1310 Dysphagia, unspecified: Secondary | ICD-10-CM

## 2024-03-27 DIAGNOSIS — R269 Unspecified abnormalities of gait and mobility: Secondary | ICD-10-CM | POA: Diagnosis not present

## 2024-03-27 DIAGNOSIS — I2782 Chronic pulmonary embolism: Secondary | ICD-10-CM

## 2024-03-27 DIAGNOSIS — M810 Age-related osteoporosis without current pathological fracture: Secondary | ICD-10-CM

## 2024-03-27 DIAGNOSIS — I63339 Cerebral infarction due to thrombosis of unspecified posterior cerebral artery: Secondary | ICD-10-CM

## 2024-03-27 DIAGNOSIS — R400 Somnolence: Secondary | ICD-10-CM | POA: Diagnosis not present

## 2024-03-27 DIAGNOSIS — I3139 Other pericardial effusion (noninflammatory): Secondary | ICD-10-CM | POA: Diagnosis not present

## 2024-03-27 DIAGNOSIS — K219 Gastro-esophageal reflux disease without esophagitis: Secondary | ICD-10-CM

## 2024-03-27 DIAGNOSIS — R001 Bradycardia, unspecified: Secondary | ICD-10-CM | POA: Diagnosis not present

## 2024-03-27 MED ORDER — CITALOPRAM HYDROBROMIDE 20 MG PO TABS: 10.0000 mg | ORAL_TABLET | Freq: Every day | ORAL | 5 refills | Status: AC

## 2024-03-27 NOTE — Assessment & Plan Note (Signed)
 risk of  falling, power w/c for mobility.

## 2024-03-27 NOTE — Assessment & Plan Note (Signed)
 normalized after dc'd Metoprolol , followed by Cardiology

## 2024-03-27 NOTE — Assessment & Plan Note (Signed)
 sometimes coughs when swallowing, worked with ST.

## 2024-03-27 NOTE — Assessment & Plan Note (Signed)
 takes Amlodipine , Lisinopril , off Metoprolol  2/2 HR 50s and c/o daytime sleepiness. Bun/creat 16/0.8 07/23/23

## 2024-03-27 NOTE — Assessment & Plan Note (Signed)
 CTA 01/27/22 showed multiple subsegmental pulmonary emboli and pericardial effusion identified, on Eliquis , Plavix , f/u Cardiology. EF 65-70% 01/27/22. Venous US  BLE no DVT.

## 2024-03-27 NOTE — Assessment & Plan Note (Addendum)
 Her mood is stable Will decrease Citalopram  to 10mg /20mg  every day, continue Wellbutrin ,  f/u psycho therapy, TSH 2.66 07/23/23, c/o day time sleepiness such as nodding off while at dinning table and while sitting in scooter in hallway even if sleeps well at night.  Update CBC/diff, CMP/eGFR, TSH, lipids, Vit B12, Vit D, Hgb a1c

## 2024-03-27 NOTE — Assessment & Plan Note (Signed)
 Hx of it, present again on CTA 01/27/22

## 2024-03-27 NOTE — Assessment & Plan Note (Signed)
 takes Fosamax , Ca, Vit D, t score -2.3 10/25/20, 06/13/23 DEXA t score -1.8

## 2024-03-27 NOTE — Assessment & Plan Note (Signed)
takes Colace.   

## 2024-03-27 NOTE — Assessment & Plan Note (Signed)
 Stable, takes Pantoprazole ,  Hgb 12 07/23/23

## 2024-03-27 NOTE — Assessment & Plan Note (Signed)
 History of resolved bradycardia after discontinue Metoprolol  in setting of c/o day time sleepiness No recurrent c/o day time sleepiness Now trying GDR of Celexa  Observe for mood changes vs daytime sleepiness.

## 2024-03-27 NOTE — Progress Notes (Signed)
 " Location:  [facility] Room Number: N014-A Place of Service:  ALF [phone]) Provider: Oniya Mandarino X, NP  Patient Care Team: Felicidad Sugarman X, NP as PCP - General (Internal Medicine) Lonni Slain, MD as PCP - Cardiology (Cardiology) Graclynn Vanantwerp X, NP as Nurse Practitioner (Internal Medicine) Debarah Lorrene DEL., MD (Ophthalmology) Joshua Sieving, MD as Consulting Physician (Dermatology) Joshua Sieving, MD as Consulting Physician (Dermatology) Darina Barrio, MD as Consulting Physician (Gynecology)  Extended Emergency Contact Information Primary Emergency Contact: Lindsay,Julie  [place] Mobile Phone: [phone] Relation: Daughter Secondary Emergency Contact: Black,Joel Mobile Phone: [phone] Relation: Son  Code Status: DNR Goals of care: Advanced Directive information    08/26/2023   12:15 PM  Advanced Directives  Does Patient Have a Medical Advance Directive? Yes  Type of Estate Agent of [place];Living will;Out of facility DNR (pink MOST or yellow form)  Does patient want to make changes to medical advance directive? No - Patient declined  Copy of Healthcare Power of Attorney in Chart? Yes - validated most recent copy scanned in chart (See row information)     Chief Complaint  Patient presents with   Medical Management of Chronic Issues    HPI:  Pt is a 89 y.o. female seen today for managing chronic medical conditions.     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 Takes Plavix . Atorvastatin . affected gait(balance issues and vision issues(seeing Ophthalmology), feels weak left leg even if muscle strength 5/5             Bradycardia, normalized after dc'd Metoprolol , followed by Cardiology              CTA 01/27/22 showed multiple subsegmental pulmonary emboli and pericardial effusion identified, on Eliquis , Plavix , f/u Cardiology. EF 65-70% 01/27/22. Venous US   BLE no DVT.              HTN, takes Amlodipine , Lisinopril , off Metoprolol  2/2 HR 50s and c/o daytime sleepiness. Bun/creat 16/0.8 07/23/23             Constipation, takes Colace                               GERD, takes Pantoprazole ,  Hgb 12 07/23/23             Dysphagia, sometimes coughs when swallowing, worked with ST.              Gait abnormality, risk of  falling, power w/c for mobility.              Chronic occasional dry cough for about a year, left side throat tickles sometimes during eating, then cough, ? Resultant of CVA.              OP, takes Fosamax , Ca, Vit D, t score -2.3 10/25/20, 06/13/23 DEXA t score -1.8             Depression, takes Citalopram , Wellbutrin ,  f/u psycho therapy, TSH 2.66 07/23/23, c/o day time sleepiness such as nodding off while at dinning table and while sitting in scooter in hallway even if sleeps well at night(not new)             Pericardial effusion, Hx of it, present again on CTA 01/27/22             Hyperlipidemia, takes Atorvastatin , LDL 61 07/23/23  OA pain, power w/c             Urinary incontinence, progressing             Hx of skin candidiasis, under breast, arms, Triamcinolone  and Nystatin cream effective.   Past Medical History:  Diagnosis Date   Anginal pain 08/24/2010   Echo-EF =>55%,LV normal   Arthritis    Cataract 12/2006   Both eyes  Epes MD   Chest pain, unspecified 09/04/2007   Lexiscan-- EF 72%; LV normal   Constipation    Takes flax seed and honey .  Smooth Move tea.   Depression    Diverticulitis    Head injury, closed    with fall   History of cardiac cath 2012   normal coronary arteries   Hypertension    Mitral valve prolapse    Osteopenia    Stroke (HCC) 09/02/2019   Past Surgical History:  Procedure Laterality Date   ANTERIOR CERVICAL DECOMP/DISCECTOMY FUSION N/A 10/23/2012   Procedure: Cervical Five to Cervical Six, Cervical Six to Cervical Seven anterior cervical decompression with fusion plating and  bonegraft;  Surgeon: Lamar LELON Peaches, MD;  Location: [facility];  Service: Neurosurgery;  Laterality: N/A;  ANTERIOR CERVICAL DECOMPRESSION/DISCECTOMY FUSION 2 LEVELS   BREAST CYST ASPIRATION  1990   benign   CARDIAC CATHETERIZATION  08/01/2010   normal coronaries   COLONOSCOPY  approx 2011   EYE SURGERY Bilateral 2011   intestinal blockage surgery  Feb. 2012   kink in small intestine per pt    Allergies[1]  Outpatient Encounter Medications as of 03/27/2024  Medication Sig   acetaminophen  (TYLENOL  8 HOUR) 650 MG CR tablet Take 1,300 mg by mouth every 8 (eight) hours as needed. (Patient taking differently: Take 650 mg by mouth at bedtime. Pain)   acetaminophen  (TYLENOL ) 325 MG tablet Take 650 mg by mouth every 6 (six) hours as needed (as needed).   alendronate  (FOSAMAX ) 70 MG tablet TAKE 1 TAB ONCE A WEEK, AT LEAST 30 MIN BEFORE 1ST FOOD.DO NOT LIE DOWN FOR 30 MIN AFTER TAKING.   amLODipine  (NORVASC ) 5 MG tablet Take 5 mg by mouth in the morning.   atorvastatin  (LIPITOR) 20 MG tablet TAKE ONE TABLET BY MOUTH ONCE DAILY.   buPROPion  (WELLBUTRIN  XL) 300 MG 24 hr tablet Take 300 mg by mouth daily.   Calcium  Carbonate-Vitamin D  (CALCIUM  600 +D HIGH POTENCY) 600-10 MG-MCG TABS Take 1 tablet by mouth in the morning.   carboxymethylcellulose (REFRESH PLUS) 0.5 % SOLN Place 1 drop into both eyes 2 (two) times daily.   clopidogrel  (PLAVIX ) 75 MG tablet TAKE 1 TABLET ONCE DAILY.   docusate sodium  (COLACE) 100 MG capsule Take 100 mg by mouth at bedtime.   ELIQUIS  2.5 MG TABS tablet Take 2.5 mg by mouth 2 (two) times daily.   lisinopril  (ZESTRIL ) 10 MG tablet Take 10 mg by mouth daily.   loratadine (CLARITIN) 10 MG tablet Take 10 mg by mouth daily.   nystatin cream (MYCOSTATIN) Apply to under breasts (rash) topically every 12 hours as needed for moisture associated rash Apply to under breasts (rash) topically two times a day for moisture associated rash for 2 Weeks   pantoprazole  (PROTONIX ) 40  MG tablet TAKE 1 TABLET ONCE DAILY.   triamcinolone  cream (KENALOG) 0.1 % Apply to under breast (rash) topically every 12 hours as needed for moisture associated rash Apply to under breasts (rash) topically two times a day for moisture associated rash for 2  Weeks   [DISCONTINUED] citalopram  (CELEXA ) 20 MG tablet Take 1 tablet (20 mg total) by mouth daily.   citalopram  (CELEXA ) 20 MG tablet Take 0.5 tablets (10 mg total) by mouth daily.   No facility-administered encounter medications on file as of 03/27/2024.    Review of Systems  Constitutional:  Negative for appetite change, fatigue and fever.  HENT:  Positive for hearing loss and rhinorrhea. Negative for congestion, sinus pressure, sinus pain, sore throat, trouble swallowing and voice change.   Eyes:  Negative for visual disturbance.  Respiratory:  Positive for cough. Negative for chest tightness, shortness of breath and wheezing.        Chronic occasional cough with clear phlegm. DOE since 01/27/22  Cardiovascular:  Negative for leg swelling.  Gastrointestinal:  Negative for abdominal pain and constipation.  Genitourinary:  Negative for dysuria, frequency and urgency.       Occasional urinary leakage   Musculoskeletal:  Positive for arthralgias, back pain and gait problem.  Skin:  Negative for rash.  Neurological:  Positive for weakness. Negative for speech difficulty and headaches.       Memory lapses occasionally. Left sided weakness.   Psychiatric/Behavioral:  Negative for behavioral problems and sleep disturbance. The patient is not nervous/anxious.     Immunization History  Administered Date(s) Administered   Covid-19 Iv Non-us  Vaccine (Bibp, Sinopharm) 12/30/2023   Fluad Quad(high Dose 65+) 12/17/2018, 12/10/2023   INFLUENZA, HIGH DOSE SEASONAL PF 12/08/2016, 12/19/2020   Influenza-Unspecified 12/16/2019, 12/25/2021, 12/05/2022   Moderna Sars-Covid-2 Vaccination 03/09/2019, 04/06/2019, 01/12/2020, 12/19/2022   Pfizer  Covid-19 Vaccine Bivalent Booster 61yrs & up 01/04/2022   Pneumococcal Conjugate-13 06/12/2013   Pneumococcal Polysaccharide-23 07/28/2002   RSV,unspecified 04/05/2022   Tdap 07/27/2009, 04/03/2023   Zoster Recombinant(Shingrix) 01/08/2017, 06/03/2017   Pertinent  Health Maintenance Due  Topic Date Due   Influenza Vaccine  Completed   Bone Density Scan  Completed      01/27/2022   10:25 AM 01/28/2022    8:15 AM 03/13/2022    9:11 AM 03/16/2022    2:03 PM 08/22/2022    9:06 AM  Fall Risk  Falls in the past year?   0 0 0  Was there an injury with Fall?   0  0  0   Fall Risk Category Calculator   0 0 0  Fall Risk Category (Retired)   Low  Low    (RETIRED) Patient Fall Risk Level Low fall risk  Moderate fall risk  Low fall risk  Low fall risk    Patient at Risk for Falls Due to   No Fall Risks No Fall Risks No Fall Risks  Fall risk Follow up   Falls evaluation completed  Falls evaluation completed       Data saved with a previous flowsheet row definition   Functional Status Survey:    Vitals:   03/27/24 0949  BP: 125/70  Pulse: 69  Temp: (!) 97.3 F (36.3 C)  SpO2: 98%  Weight: 150 lb 9.6 oz (68.3 kg)  Height: 5' 6 (1.676 m)   Body mass index is 24.31 kg/m. Physical Exam Vitals and nursing note reviewed.  Constitutional:      Appearance: Normal appearance.  HENT:     Head: Normocephalic and atraumatic.     Nose: Nose normal.     Mouth/Throat:     Mouth: Mucous membranes are moist.  Eyes:     Extraocular Movements: Extraocular movements intact.     Conjunctiva/sclera: Conjunctivae normal.  Pupils: Pupils are equal, round, and reactive to light.     Comments: Lateral right eye peripheral vision field loss since CVA 11/2018.  Cardiovascular:     Rate and Rhythm: Normal rate and regular rhythm.     Heart sounds: No murmur heard. Pulmonary:     Effort: Pulmonary effort is normal.     Breath sounds: No rales.  Abdominal:     General: Bowel sounds are normal.      Palpations: Abdomen is soft.     Tenderness: There is no abdominal tenderness.  Musculoskeletal:        General: Tenderness present.     Cervical back: Normal range of motion and neck supple.     Right lower leg: No edema.     Left lower leg: No edema.     Comments: Kyphoscoliosis. Left shoulder over head ROM decreased.  C/o left knee pain, weakness Soreness in buttocks since fall 02/23/24  Skin:    General: Skin is warm and dry.     Findings: No bruising or rash.     Comments:    Neurological:     Mental Status: She is alert and oriented to person, place, and time. Mental status is at baseline.     Motor: Weakness present.     Coordination: Coordination normal.     Gait: Gait abnormal.     Deep Tendon Reflexes: Reflexes normal.     Comments: Left sided weakness with muscle strength 5/5. W/c for mobility   Psychiatric:        Mood and Affect: Mood normal.        Behavior: Behavior normal.     Labs reviewed: Recent Labs    07/23/23 0000  NA 141  K 3.8  CL 106  CO2 27*  BUN 16  CREATININE 0.8  CALCIUM  9.7   Recent Labs    07/23/23 0000  AST 19  ALT 13  ALKPHOS 60  ALBUMIN 3.9   Recent Labs    07/23/23 0000  WBC 5.6  NEUTROABS 3,052.00  HGB 12.0  HCT 37  PLT 277   Lab Results  Component Value Date   TSH 2.66 07/23/2023   Lab Results  Component Value Date   HGBA1C 5.3 09/03/2019   Lab Results  Component Value Date   CHOL 124 07/23/2023   HDL 52 07/23/2023   LDLCALC 61 07/23/2023   TRIG 38 (A) 07/23/2023   CHOLHDL 2.7 10/19/2021    Significant Diagnostic Results in last 30 days:  No results found.  Assessment/Plan Depression with anxiety Her mood is stable Will decrease Citalopram  to 10mg /20mg  every day, continue Wellbutrin ,  f/u psycho therapy, TSH 2.66 07/23/23, c/o day time sleepiness such as nodding off while at dinning table and while sitting in scooter in hallway even if sleeps well at night.  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 Takes Plavix . Atorvastatin . affected gait(balance issues and vision issues(seeing Ophthalmology), feels weak left leg even if muscle strength 5/5  Bradycardia normalized after dc'd Metoprolol , followed by Cardiology  Pulmonary embolus (HCC) CTA 01/27/22 showed multiple subsegmental pulmonary emboli and pericardial effusion identified, on Eliquis , Plavix , f/u Cardiology. EF 65-70% 01/27/22. Venous US  BLE no DVT.   Hypertension takes Amlodipine , Lisinopril , off Metoprolol  2/2 HR 50s and  c/o daytime sleepiness. Bun/creat 16/0.8 07/23/23  Daytime sleepiness History of resolved bradycardia after discontinue Metoprolol  in setting of c/o day time sleepiness No recurrent c/o day time sleepiness Now trying GDR of Celexa  Observe for mood changes vs daytime sleepiness.   Slow transit constipation takes Colace         GERD (gastroesophageal reflux disease) Stable, takes Pantoprazole ,  Hgb 12 07/23/23  Dysphagia sometimes coughs when swallowing, worked with ST.   Gait abnormality risk of  falling, power w/c for mobility.   Osteoporosis takes Fosamax , Ca, Vit D, t score -2.3 10/25/20, 06/13/23 DEXA t score -1.8     Family/ staff Communication: plan of care reviewed with the patient and charge nurse.   Labs/tests ordered:  CBC/diff, CMP/eGFR, TSH, lipids, Vit B12, Vit D, Hgb a1c     [1]  Allergies Allergen Reactions   Contrast Media [Iodinated Contrast Media] Hives    Hives during IVP at urology office '02, requires 13 hr prep///a.[place]  Patient came thru ER and had a 1 hour pre-medication and did fine   Oxycontin  [Oxycodone ] Other (See Comments)    Decreased appetite.   Boniva [Ibandronate] Nausea And Vomiting and Other (See Comments)    Weakness     Codeine Diarrhea and Nausea And Vomiting   Darvocet [Propoxyphene N-Acetaminophen ] Diarrhea and Nausea And Vomiting   Erythromycin Nausea And Vomiting   "

## 2024-03-27 NOTE — Assessment & Plan Note (Signed)
 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 Takes Plavix . Atorvastatin . affected gait(balance issues and vision issues(seeing Ophthalmology), feels weak left leg even if muscle strength 5/5

## 2024-03-31 LAB — BASIC METABOLIC PANEL WITH GFR
BUN: 12 (ref 4–21)
CO2: 28 — AB (ref 13–22)
Chloride: 108 (ref 99–108)
Creatinine: 0.8 (ref 0.5–1.1)
Glucose: 91
Potassium: 4.3 meq/L (ref 3.5–5.1)
Sodium: 142 (ref 137–147)

## 2024-03-31 LAB — CBC: RBC: 3.78 — AB (ref 3.87–5.11)

## 2024-03-31 LAB — HEPATIC FUNCTION PANEL
ALT: 12 U/L (ref 7–35)
AST: 18 (ref 13–35)

## 2024-03-31 LAB — VITAMIN B12: Vitamin B-12: 165

## 2024-03-31 LAB — LIPID PANEL
Cholesterol: 110 (ref 0–200)
HDL: 46 (ref 35–70)
LDL Cholesterol: 51
Triglycerides: 46 (ref 40–160)

## 2024-03-31 LAB — COMPREHENSIVE METABOLIC PANEL WITH GFR
Albumin: 3.8 (ref 3.5–5.0)
Calcium: 9.5 (ref 8.7–10.7)
Globulin: 2.1
eGFR: 67

## 2024-03-31 LAB — CBC AND DIFFERENTIAL
HCT: 36 (ref 36–46)
Hemoglobin: 11.8 — AB (ref 12.0–16.0)
WBC: 4.9

## 2024-04-01 ENCOUNTER — Encounter: Payer: Self-pay | Admitting: Nurse Practitioner

## 2024-04-01 DIAGNOSIS — E538 Deficiency of other specified B group vitamins: Secondary | ICD-10-CM | POA: Insufficient documentation

## 2024-04-03 ENCOUNTER — Encounter: Payer: Self-pay | Admitting: Nurse Practitioner

## 2024-04-03 ENCOUNTER — Non-Acute Institutional Stay (SKILLED_NURSING_FACILITY): Payer: Self-pay | Admitting: Nurse Practitioner

## 2024-04-03 DIAGNOSIS — I63339 Cerebral infarction due to thrombosis of unspecified posterior cerebral artery: Secondary | ICD-10-CM

## 2024-04-03 DIAGNOSIS — R269 Unspecified abnormalities of gait and mobility: Secondary | ICD-10-CM

## 2024-04-03 DIAGNOSIS — K219 Gastro-esophageal reflux disease without esophagitis: Secondary | ICD-10-CM | POA: Diagnosis not present

## 2024-04-03 DIAGNOSIS — K5901 Slow transit constipation: Secondary | ICD-10-CM | POA: Diagnosis not present

## 2024-04-03 DIAGNOSIS — M810 Age-related osteoporosis without current pathological fracture: Secondary | ICD-10-CM

## 2024-04-03 DIAGNOSIS — I2782 Chronic pulmonary embolism: Secondary | ICD-10-CM | POA: Diagnosis not present

## 2024-04-03 DIAGNOSIS — I10 Essential (primary) hypertension: Secondary | ICD-10-CM

## 2024-04-03 DIAGNOSIS — F418 Other specified anxiety disorders: Secondary | ICD-10-CM

## 2024-04-03 DIAGNOSIS — E785 Hyperlipidemia, unspecified: Secondary | ICD-10-CM | POA: Diagnosis not present

## 2024-04-03 DIAGNOSIS — R1310 Dysphagia, unspecified: Secondary | ICD-10-CM

## 2024-04-03 NOTE — Assessment & Plan Note (Signed)
 takes Fosamax , Ca, Vit D, t score -2.3 10/25/20, 06/13/23 DEXA t score -1.8

## 2024-04-03 NOTE — Assessment & Plan Note (Signed)
 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 Takes Plavix . Atorvastatin . affected gait(balance issues and vision issues(seeing Ophthalmology), feels weak left leg even if muscle strength 5/5, also affected R eye peripheral vision.

## 2024-04-03 NOTE — Assessment & Plan Note (Addendum)
"   takes Wellbutrin ,  f/u psycho therapy, GDR Citalopram  03/27/24 2/2 c/o day time sleepiness such as nodding off while at dinning table and while sitting in scooter in hallway even if sleeps well at night(not new), seems better, too soon to eval. TSH 2.71 03/31/24 "

## 2024-04-03 NOTE — Assessment & Plan Note (Signed)
Stable, continue Colace.  

## 2024-04-03 NOTE — Assessment & Plan Note (Signed)
 sometimes coughs when swallowing, worked with ST.

## 2024-04-03 NOTE — Assessment & Plan Note (Signed)
 Blood pressures are controlled  takes Amlodipine , Lisinopril , off Metoprolol  2/2 HR 50s and c/o daytime sleepiness(no change of daytime sleepiness). Bun/creat 12/0.8 03/31/24

## 2024-04-03 NOTE — Assessment & Plan Note (Signed)
 CTA 01/27/22 showed multiple subsegmental pulmonary emboli and pericardial effusion identified, on Eliquis , Plavix , f/u Cardiology. EF 65-70% 01/27/22. Venous US  BLE no DVT.

## 2024-04-03 NOTE — Assessment & Plan Note (Signed)
 risk of  falling, power w/c for mobility.

## 2024-04-03 NOTE — Assessment & Plan Note (Signed)
 Stable takes Pantoprazole ,  Hgb 11.8 03/31/24

## 2024-04-03 NOTE — Assessment & Plan Note (Signed)
"   takes Atorvastatin , LDL 51 03/31/24 "
